# Patient Record
Sex: Male | Born: 1945 | Race: Black or African American | Hispanic: No | Smoking: Light tobacco smoker
Health system: Southern US, Community
[De-identification: ages and names within clinical notes are randomized; demographics above are authoritative.]

## PROBLEM LIST (undated history)

## (undated) DIAGNOSIS — I2510 Atherosclerotic heart disease of native coronary artery without angina pectoris: Secondary | ICD-10-CM

## (undated) DIAGNOSIS — K219 Gastro-esophageal reflux disease without esophagitis: Secondary | ICD-10-CM

## (undated) DIAGNOSIS — J449 Chronic obstructive pulmonary disease, unspecified: Secondary | ICD-10-CM

## (undated) DIAGNOSIS — C801 Malignant (primary) neoplasm, unspecified: Secondary | ICD-10-CM

## (undated) DIAGNOSIS — R519 Headache, unspecified: Secondary | ICD-10-CM

## (undated) DIAGNOSIS — Z87442 Personal history of urinary calculi: Secondary | ICD-10-CM

## (undated) DIAGNOSIS — Z720 Tobacco use: Secondary | ICD-10-CM

## (undated) DIAGNOSIS — M545 Low back pain, unspecified: Secondary | ICD-10-CM

## (undated) DIAGNOSIS — I209 Angina pectoris, unspecified: Secondary | ICD-10-CM

## (undated) DIAGNOSIS — R0989 Other specified symptoms and signs involving the circulatory and respiratory systems: Secondary | ICD-10-CM

## (undated) DIAGNOSIS — M1990 Unspecified osteoarthritis, unspecified site: Secondary | ICD-10-CM

## (undated) DIAGNOSIS — I10 Essential (primary) hypertension: Secondary | ICD-10-CM

## (undated) DIAGNOSIS — G629 Polyneuropathy, unspecified: Secondary | ICD-10-CM

## (undated) DIAGNOSIS — I509 Heart failure, unspecified: Secondary | ICD-10-CM

## (undated) DIAGNOSIS — E785 Hyperlipidemia, unspecified: Secondary | ICD-10-CM

## (undated) DIAGNOSIS — N189 Chronic kidney disease, unspecified: Secondary | ICD-10-CM

## (undated) DIAGNOSIS — E119 Type 2 diabetes mellitus without complications: Secondary | ICD-10-CM

## (undated) HISTORY — DX: Low back pain, unspecified: M54.50

## (undated) HISTORY — DX: Tobacco use: Z72.0

## (undated) HISTORY — DX: Hyperlipidemia, unspecified: E78.5

## (undated) HISTORY — DX: Chronic kidney disease, unspecified: N18.9

## (undated) HISTORY — DX: Type 2 diabetes mellitus without complications: E11.9

## (undated) HISTORY — DX: Malignant (primary) neoplasm, unspecified: C80.1

## (undated) HISTORY — PX: THROAT SURGERY: SHX803

## (undated) HISTORY — DX: Low back pain: M54.5

## (undated) HISTORY — DX: Essential (primary) hypertension: I10

## (undated) HISTORY — DX: Chronic obstructive pulmonary disease, unspecified: J44.9

## (undated) HISTORY — PX: CHOLECYSTECTOMY: SHX55

---

## 2006-01-28 ENCOUNTER — Ambulatory Visit: Payer: Self-pay | Admitting: Gastroenterology

## 2006-01-28 LAB — HM COLONOSCOPY

## 2008-06-09 ENCOUNTER — Ambulatory Visit: Payer: Self-pay | Admitting: Otolaryngology

## 2008-06-09 IMAGING — CT CT NECK WITH CONTRAST
1 of 2 series · 9 of 14 positions shown, 12 images · IV contrast (agent unspecified)
Comparison: none

REASON FOR EXAM: left vocal cord lesion
COMMENTS:

PROCEDURE:     CT  - CT NECK WITH CONTRAST  - [DATE]  [DATE]
RESULT:     CT of the neck is performed utilizing approximately 75 ml of
[XL] iodinated intravenous contrast. Images are reconstructed in the
axial plane at 3 mm slice thickness.
There is no previous examination for comparison.

[Series 2: soft tissue · axial · 0.54mm/px · z∈[+1040,+1242]mm · 9 of 85 slices shown, 12 images]
[im 9/85  soft-tissue]
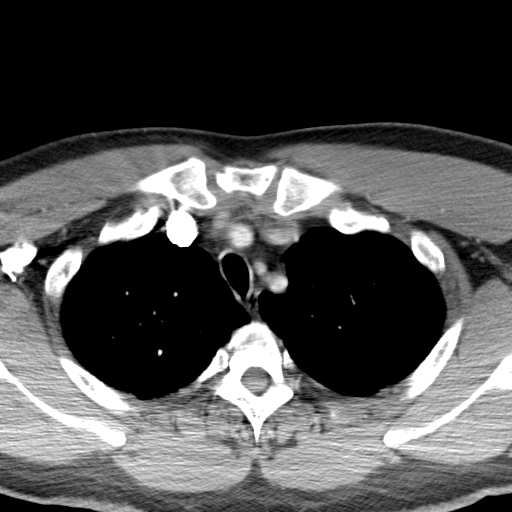
[im 9/85  bone]
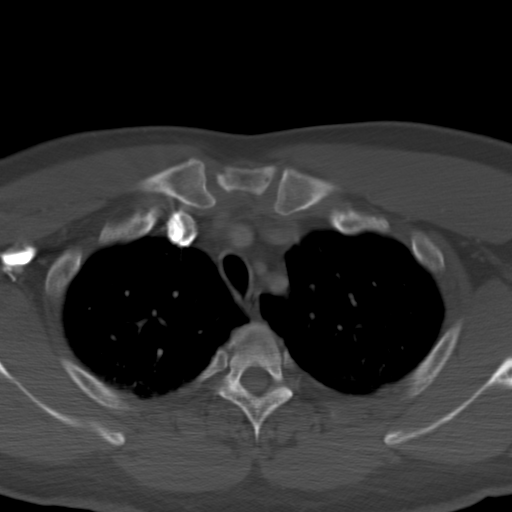
[im 17/85  bone]
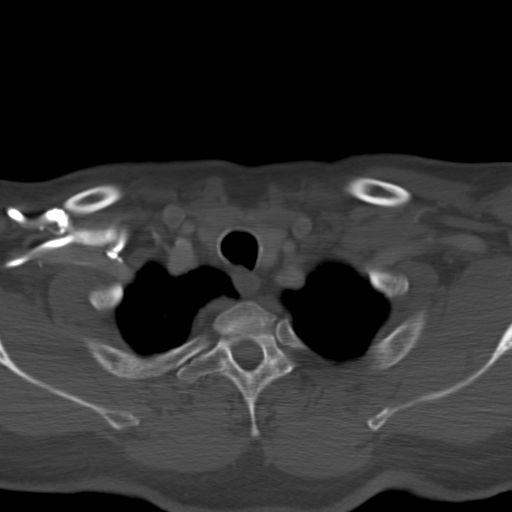
[im 26/85  bone]
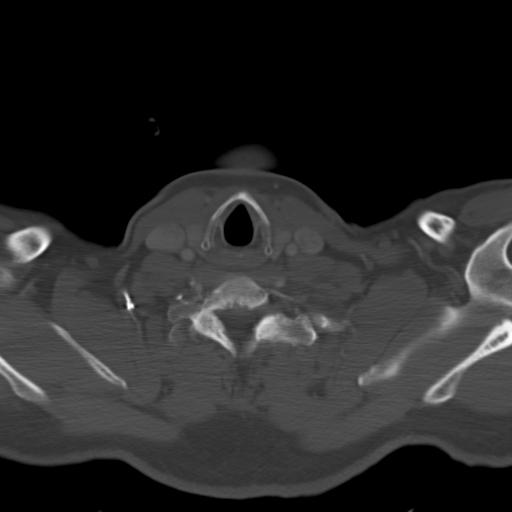
[im 34/85  bone]
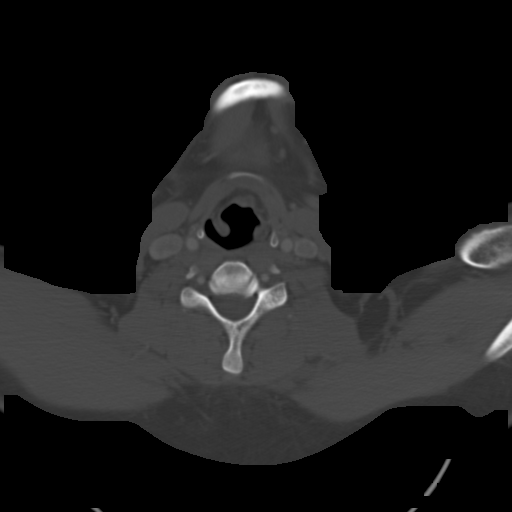
[im 43/85  soft-tissue]
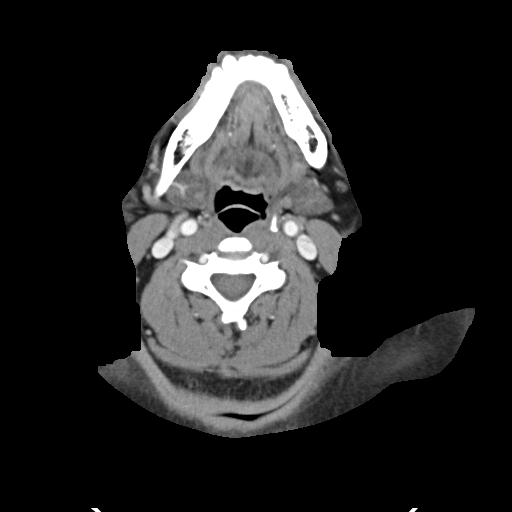
[im 43/85  bone]
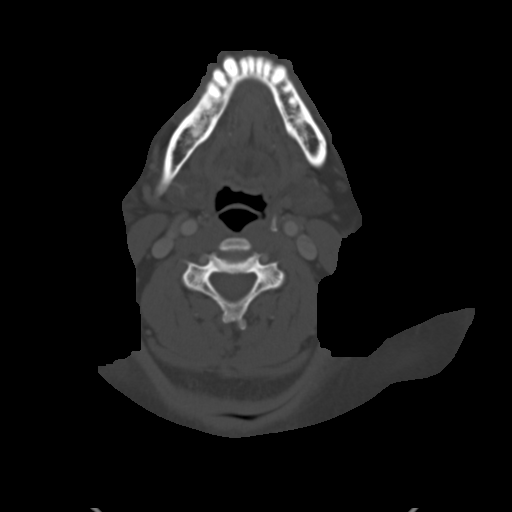
[im 51/85  bone]
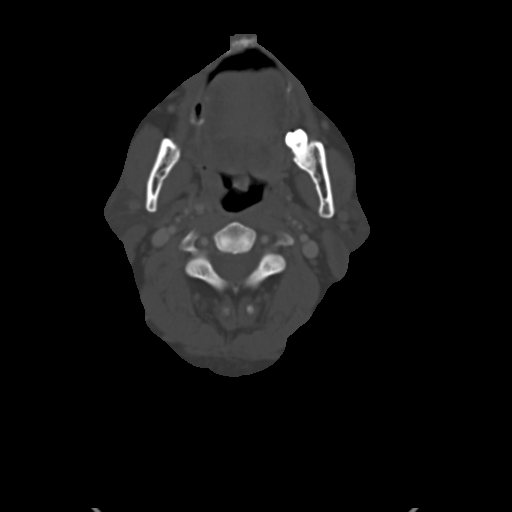
[im 59/85  bone]
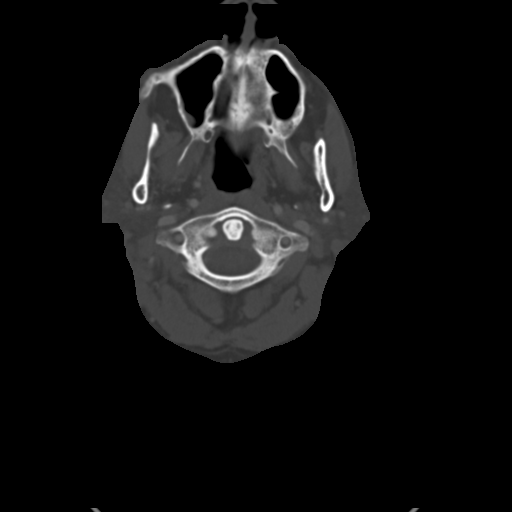
[im 68/85  bone]
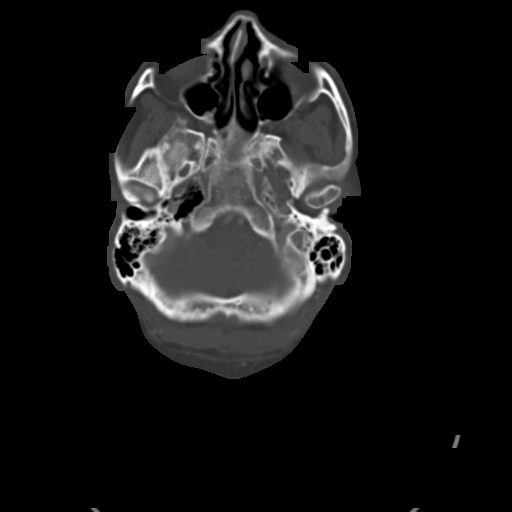
[im 76/85  soft-tissue]
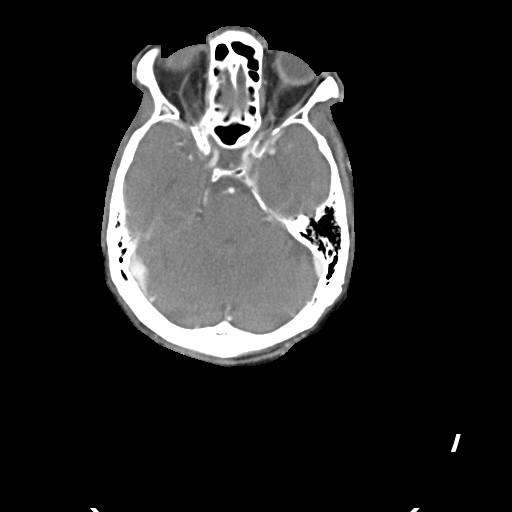
[im 76/85  bone]
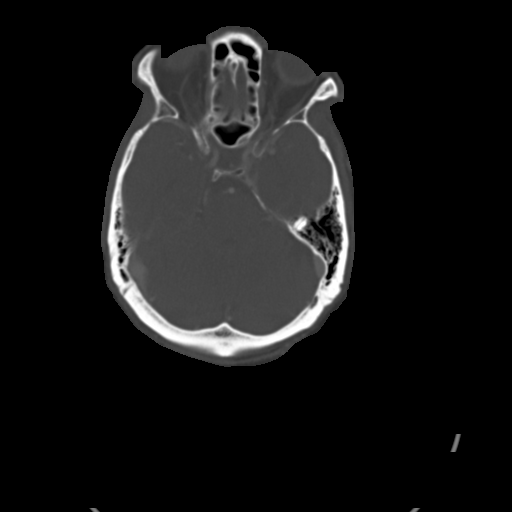

[9 of 14 positions shown; findings below may reference images not displayed]

FINDINGS: Images through the base of the brain demonstrate no evidence of
abnormal enhancement. The included paranasal sinuses and mastoids show what
appears to be an air-fluid level in the right sphenoid sinus with some
mucosal thickening in the left sphenoid sinus. The mastoids show normal
aeration. The nasopharyngeal and oropharyngeal regions appear to be
unremarkable. The base of the tongue is within normal limits. There is no
prevertebral mass evident. The pterygoid fossa is unremarkable. The vascular
space is within normal limits. There is no evidence of adenopathy. No
definite vocal cord lesion is identified by CT. Again, there is no evidence
of cervical adenopathy. The thyroid lobes are unremarkable. The superior
mediastinum is within normal limits.
IMPRESSION: Some areas of mucosal thickening in the left sphenoid sinus
with a small air/fluid level on the right in the sphenoid sinus. Otherwise
unremarkable exam.

## 2008-06-15 ENCOUNTER — Ambulatory Visit: Payer: Self-pay | Admitting: Otolaryngology

## 2008-06-19 ENCOUNTER — Ambulatory Visit: Payer: Self-pay | Admitting: Radiation Oncology

## 2008-06-26 ENCOUNTER — Ambulatory Visit: Payer: Self-pay | Admitting: Radiation Oncology

## 2008-06-28 IMAGING — CT CT GUIDANCE PLACEMENT RAD THERAPY FIELDS
1 series · 16 of 32 positions shown, 20 images · non-contrast
Comparison: none

[Series 2: tx planning · axial · 0.98mm/px · z∈[-122,+150]mm · 16 of 121 slices shown, 20 images]
[im 8/121  soft-tissue]
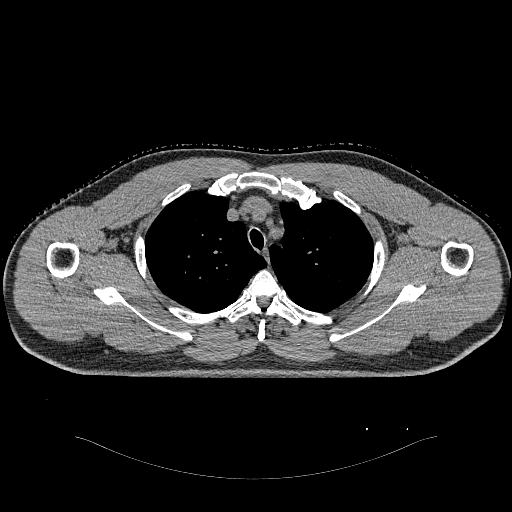
[im 8/121  bone]
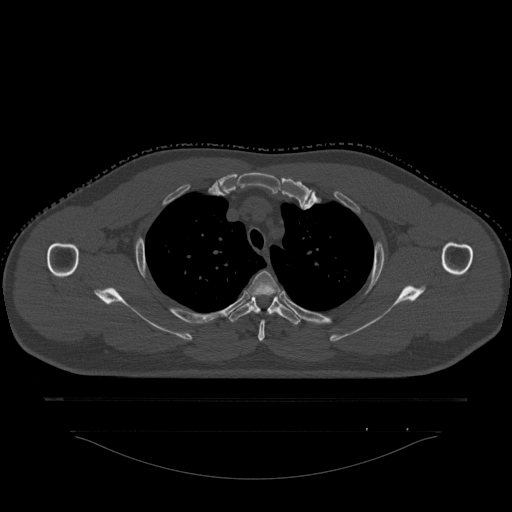
[im 16/121  soft-tissue]
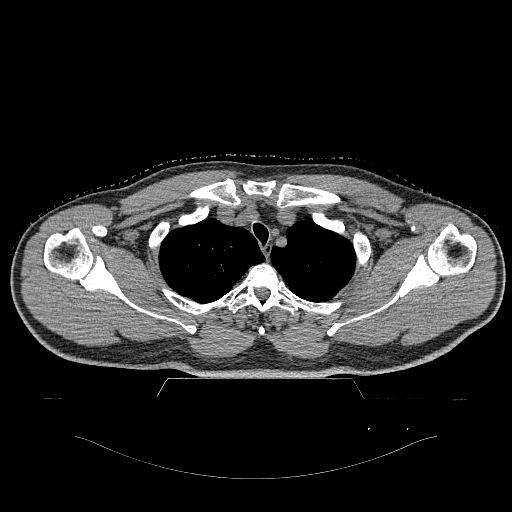
[im 24/121  soft-tissue]
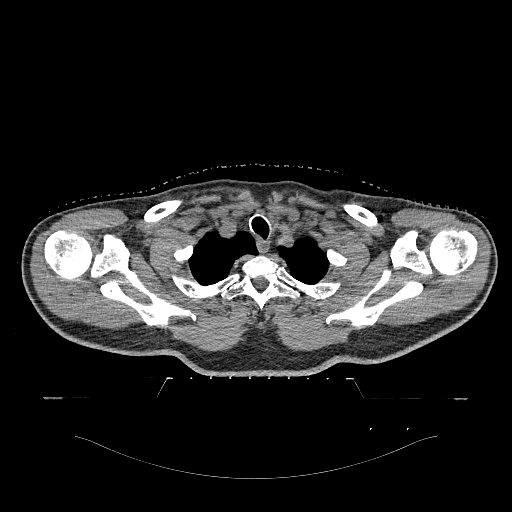
[im 31/121  soft-tissue]
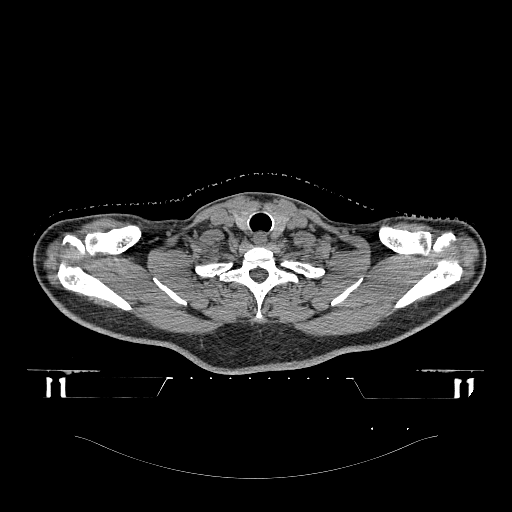
[im 39/121  soft-tissue]
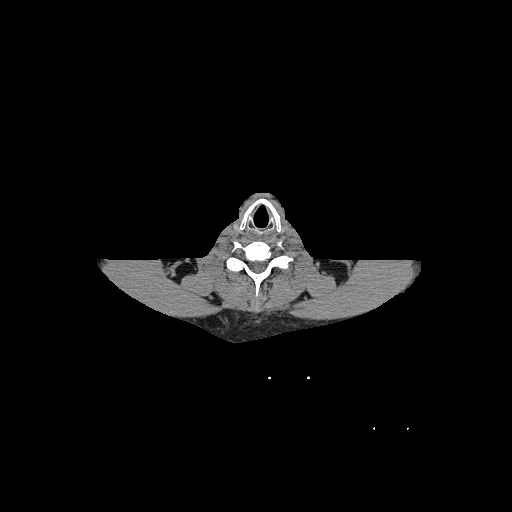
[im 47/121  soft-tissue]
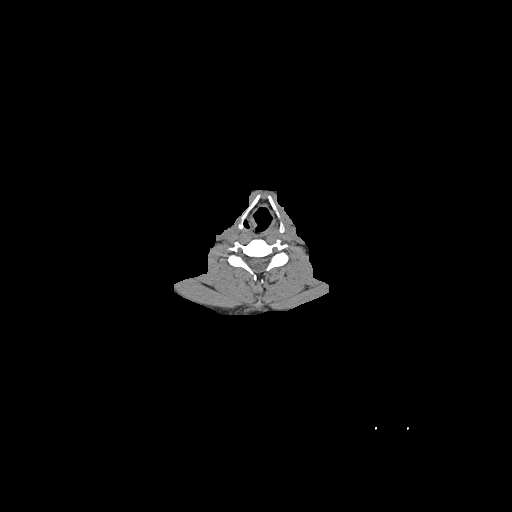
[im 55/121  soft-tissue]
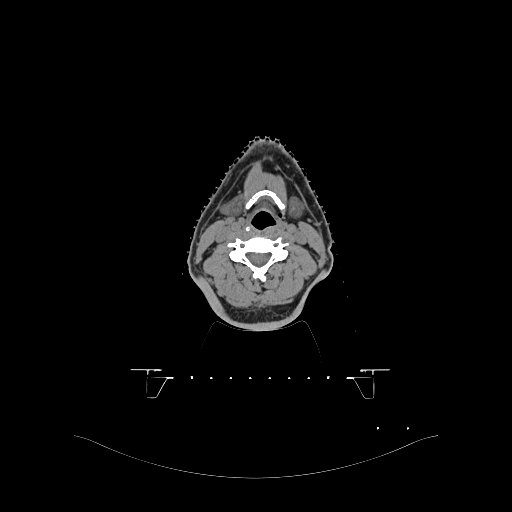
[im 66/121  soft-tissue]
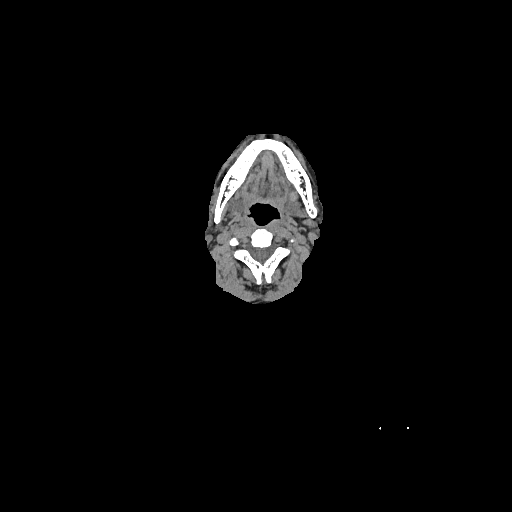
[im 74/121  soft-tissue]
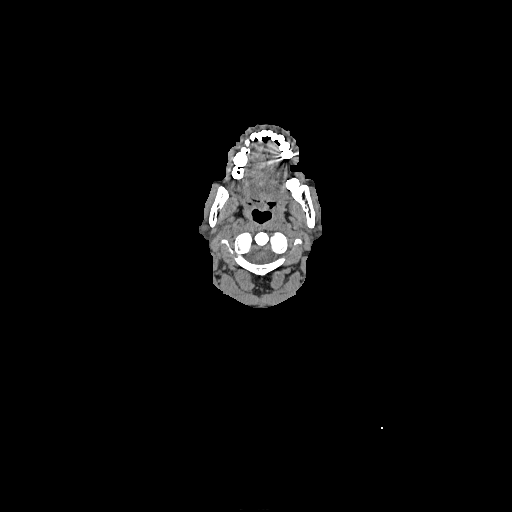
[im 74/121  bone]
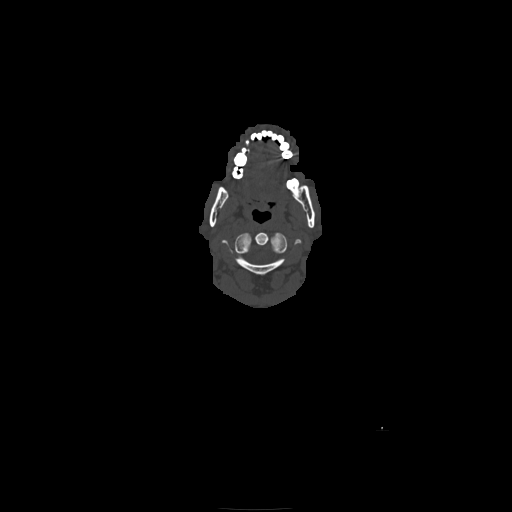
[im 82/121  soft-tissue]
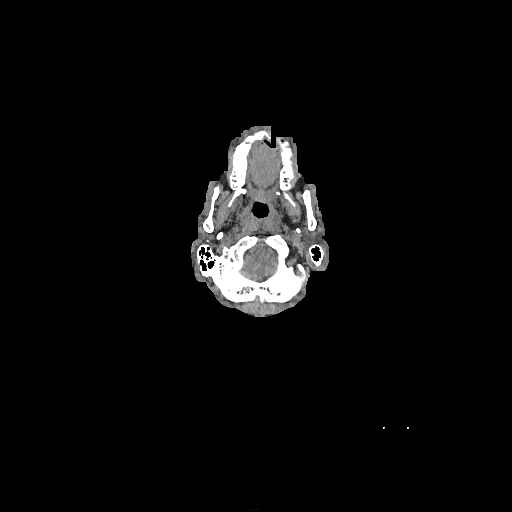
[im 90/121  soft-tissue]
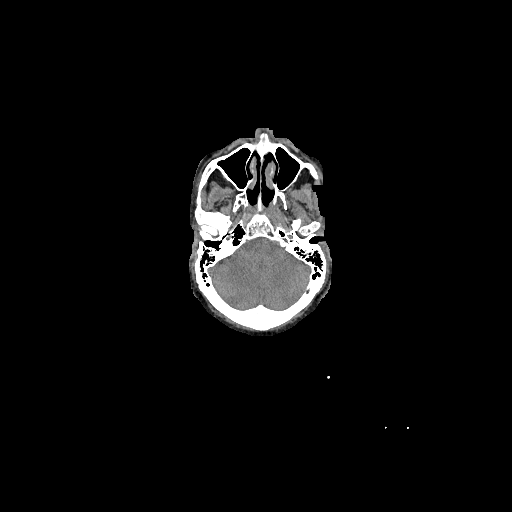
[im 97/121  soft-tissue]
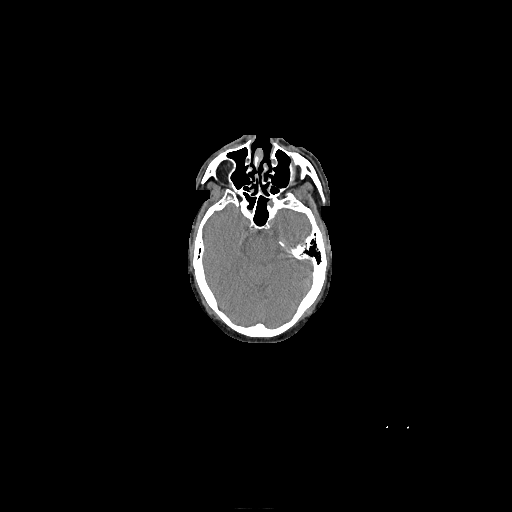
[im 105/121  soft-tissue]
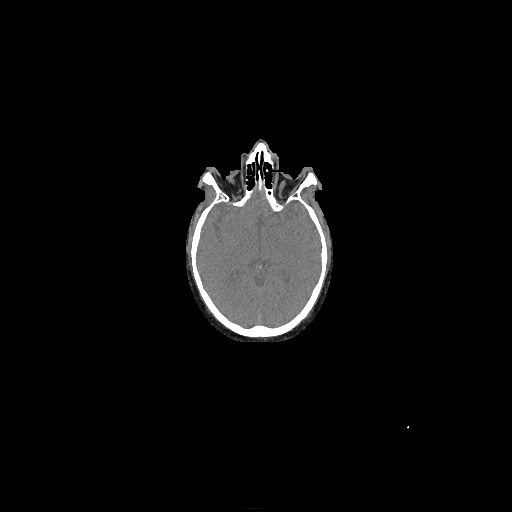
[im 105/121  lung]
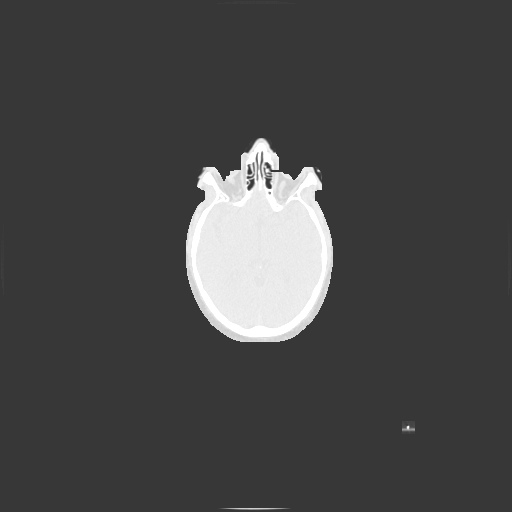
[im 109/121  lung]
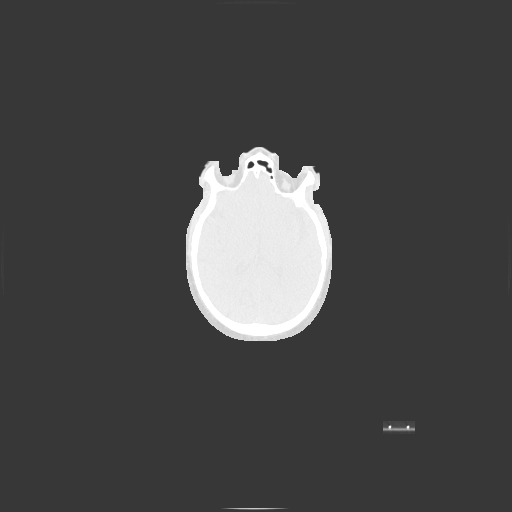
[im 113/121  soft-tissue]
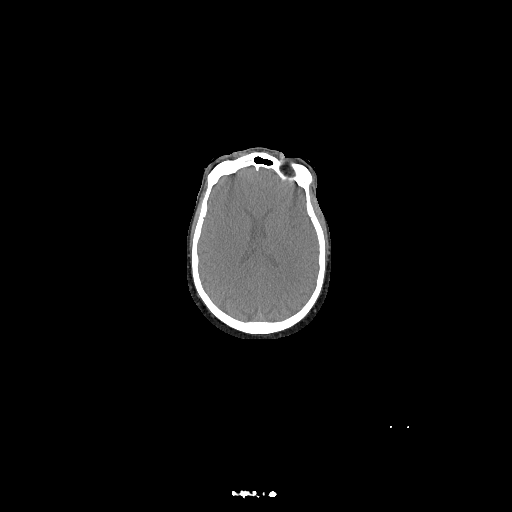
[im 113/121  lung]
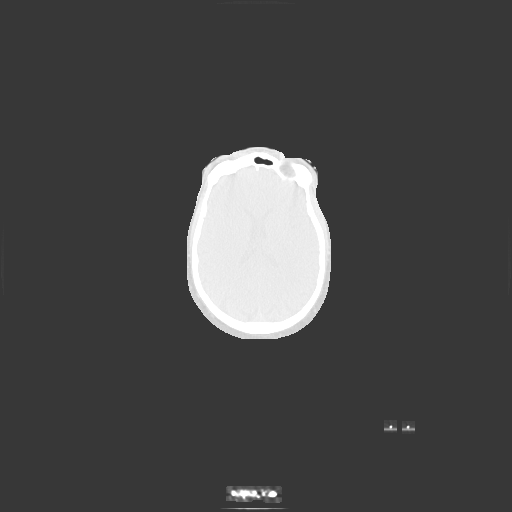
[im 117/121  lung]
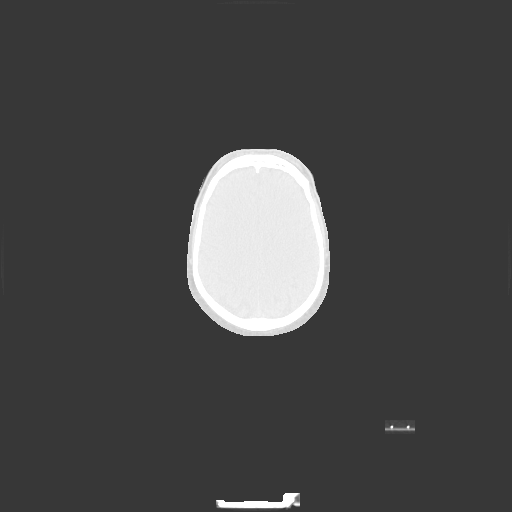

[16 of 32 positions shown; findings below may reference images not displayed]

IMAGES IMPORTED FROM THE [employer] WORKFLOW SYSTEM
NO DICTATION FOR STUDY

## 2008-07-20 ENCOUNTER — Ambulatory Visit: Payer: Self-pay | Admitting: Radiation Oncology

## 2008-08-19 ENCOUNTER — Ambulatory Visit: Payer: Self-pay | Admitting: Radiation Oncology

## 2008-09-19 ENCOUNTER — Ambulatory Visit: Payer: Self-pay | Admitting: Radiation Oncology

## 2008-10-19 ENCOUNTER — Ambulatory Visit: Payer: Self-pay | Admitting: Radiation Oncology

## 2009-01-19 ENCOUNTER — Ambulatory Visit: Payer: Self-pay | Admitting: Radiation Oncology

## 2009-01-29 ENCOUNTER — Ambulatory Visit: Payer: Self-pay | Admitting: Radiation Oncology

## 2009-02-19 ENCOUNTER — Ambulatory Visit: Payer: Self-pay | Admitting: Radiation Oncology

## 2009-07-20 ENCOUNTER — Ambulatory Visit: Payer: Self-pay | Admitting: Radiation Oncology

## 2009-07-30 ENCOUNTER — Ambulatory Visit: Payer: Self-pay | Admitting: Radiation Oncology

## 2009-08-19 ENCOUNTER — Ambulatory Visit: Payer: Self-pay | Admitting: Radiation Oncology

## 2010-07-14 ENCOUNTER — Emergency Department: Payer: Self-pay | Admitting: Emergency Medicine

## 2010-07-14 IMAGING — CT CT STONE STUDY
1 of 2 series · 15 of 32 positions shown, 19 images · non-contrast
Comparison: none

REASON FOR EXAM: right flank pain with mild hematuria
COMMENTS:   LMP: (Male)

PROCEDURE:     CT  - CT ABDOMEN /PELVIS WO (STONE)  - [DATE]  [DATE]
RESULT:     Comparison: None
TECHNIQUE: Multiple axial images from the lung bases to the symphysis pubis
were obtained without oral and without intravenous contrast.

[Series 2: stone · axial · 0.70mm/px · z∈[-666,-270]mm · 15 of 149 slices shown, 19 images]
[im 11/149  soft-tissue]
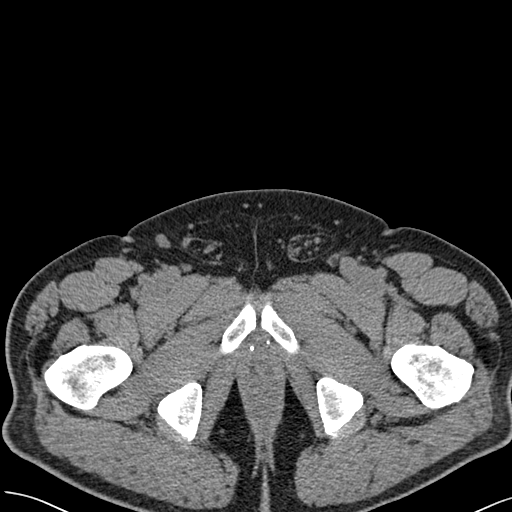
[im 11/149  bone]
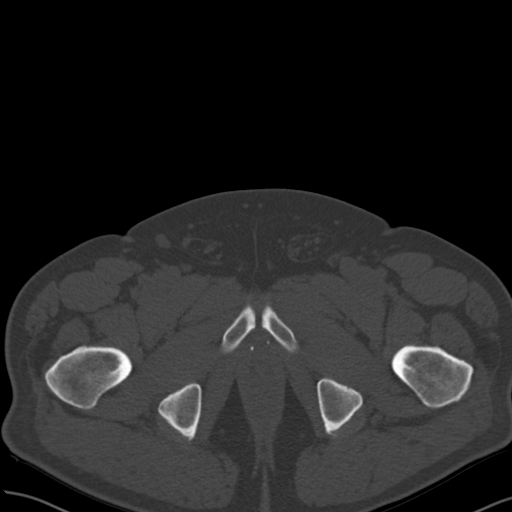
[im 22/149  soft-tissue]
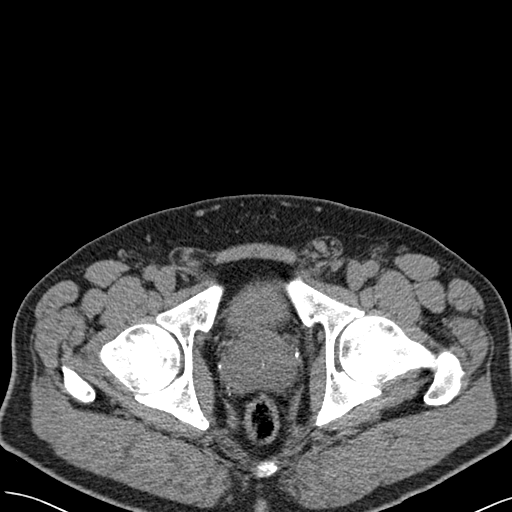
[im 33/149  soft-tissue]
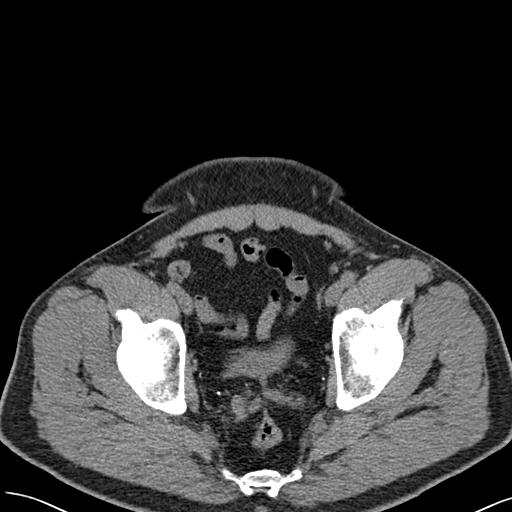
[im 44/149  soft-tissue]
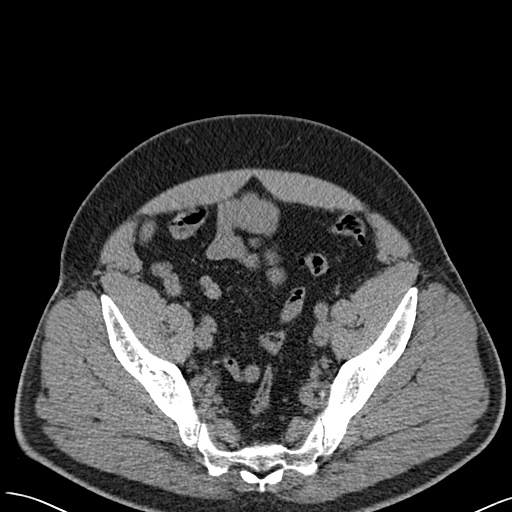
[im 55/149  soft-tissue]
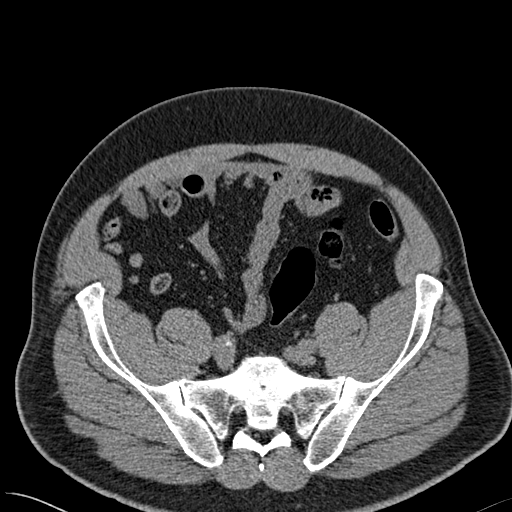
[im 66/149  soft-tissue]
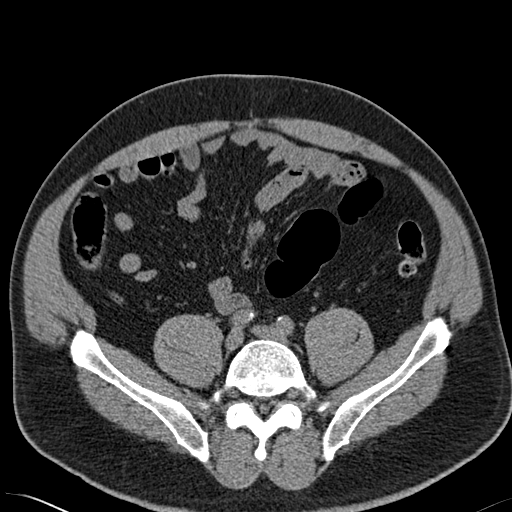
[im 77/149  soft-tissue]
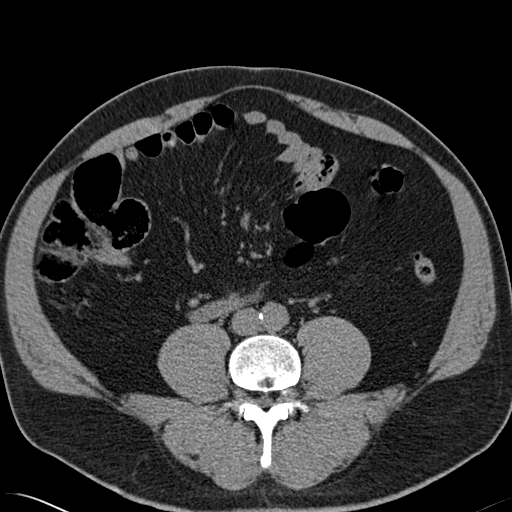
[im 88/149  soft-tissue]
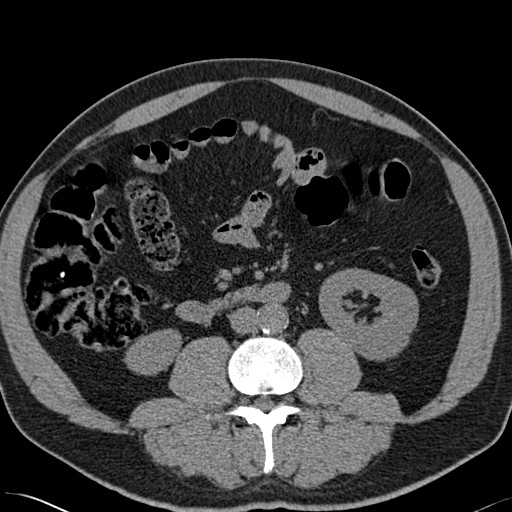
[im 99/149  soft-tissue]
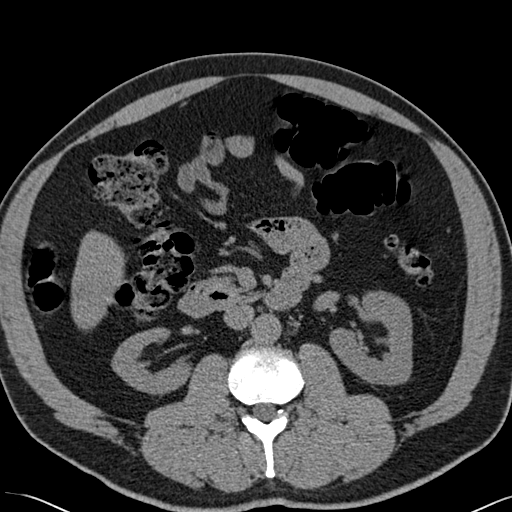
[im 99/149  bone]
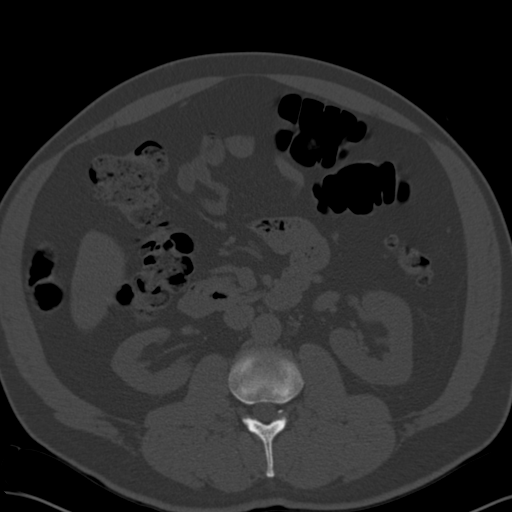
[im 110/149  soft-tissue]
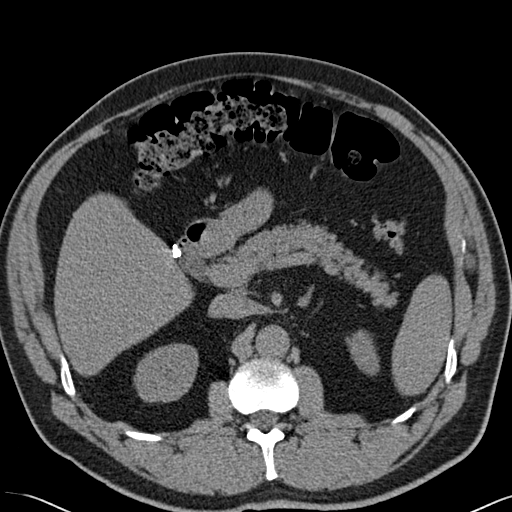
[im 121/149  soft-tissue]
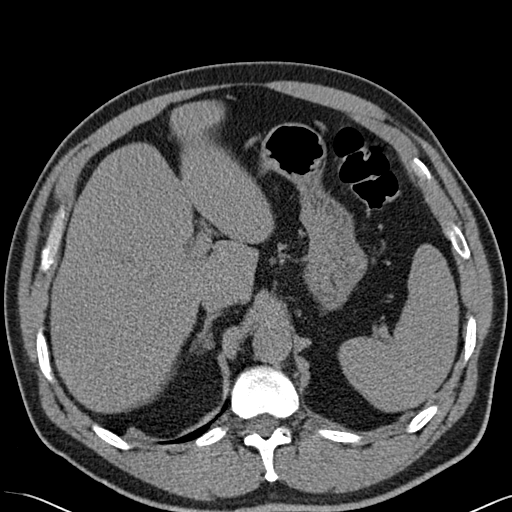
[im 127/149  lung]
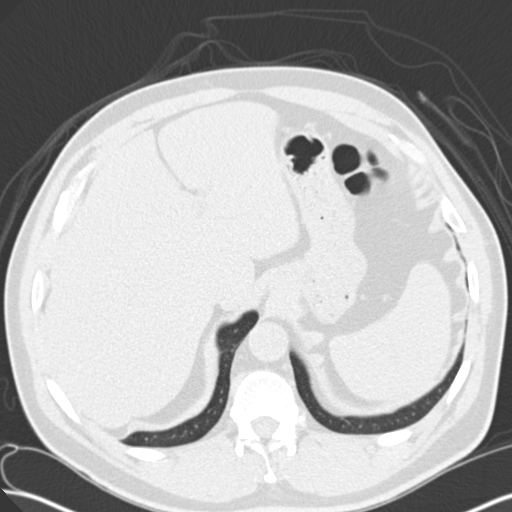
[im 132/149  soft-tissue]
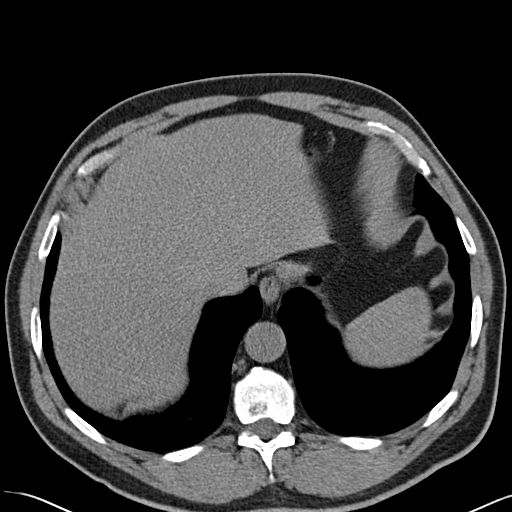
[im 132/149  lung]
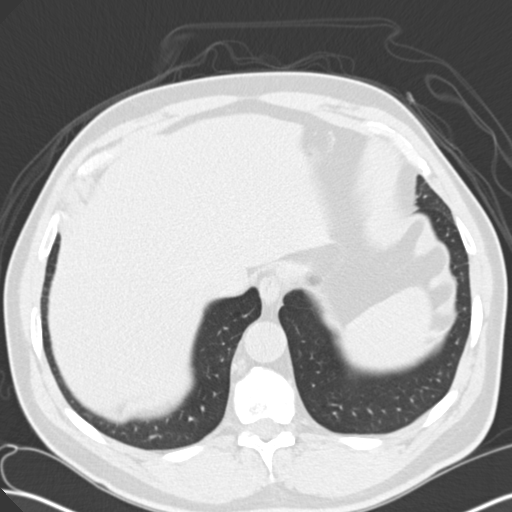
[im 138/149  lung]
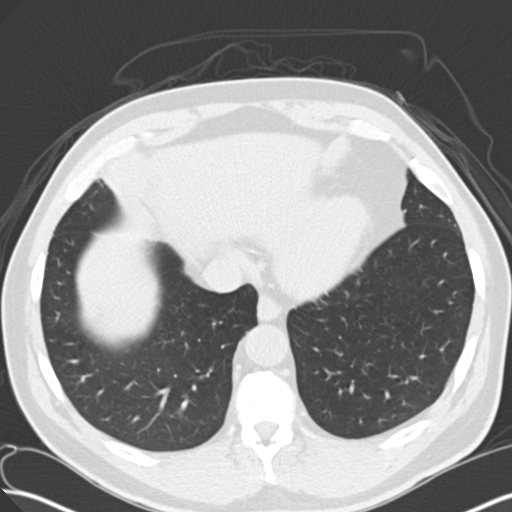
[im 143/149  soft-tissue]
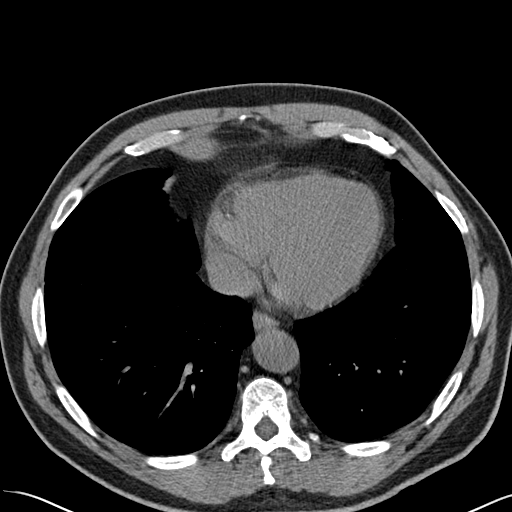
[im 143/149  lung]
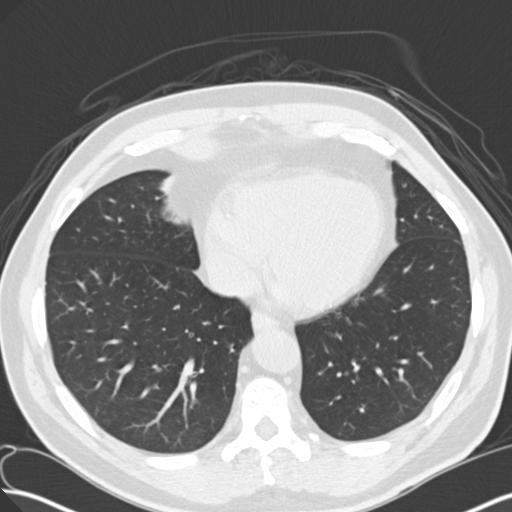

[15 of 32 positions shown; findings below may reference images not displayed]

FINDINGS: Lack of intravenous contrast limits evaluation of the solid abdominal
organs. Grossly, the liver, spleen, adrenals, and pancreas are unremarkable.
Surgical clips seen from prior cholecystectomy. Punctate calcifications in
the liver likely sequela of old prior infection.

No renal calculi identified. No hydronephrosis. No ureterectasis. Small 2-3
mm calcifications in the pelvis are felt to represent phleboliths.
Evaluation of the ureters in this location is difficult as the ureters are
decompressed.

The small and large bowel are normal in caliber. There is diverticulosis of
the sigmoid colon and descending colon. The appendix is normal.

No aggressive lytic or sclerotic osseous lesions identified. A small
sclerotic density in the L3 vertebral body is felt to represent a bone
island.
IMPRESSION: No urinary calculi or hydronephrosis.

## 2010-07-31 ENCOUNTER — Ambulatory Visit: Payer: Self-pay | Admitting: Radiation Oncology

## 2010-08-20 ENCOUNTER — Ambulatory Visit: Payer: Self-pay | Admitting: Radiation Oncology

## 2013-02-08 LAB — PSA

## 2014-02-22 ENCOUNTER — Ambulatory Visit: Payer: Self-pay

## 2014-02-22 IMAGING — US ULTRASOUND RETROPERITONEAL COMPLETE
1 series · 14 of 25 positions shown · non-contrast
Comparison: CT of the abdomen and pelvis without contrast
[DATE].

CLINICAL DATA: 66-year-old male with family history of abdominal
aortic aneurysm. Abdominal aortic aneurysm screening.

EXAM:
ULTRASOUND RETROPERITONEAL COMPLETE
TECHNIQUE: Ultrasound examination of the abdominal aorta was performed to
evaluate for abdominal aortic aneurysm. The common iliac arteries,
IVC, and kidneys were also evaluated.

[Series 1: ultrasound retroperitoneal complete · 0.31mm/px · 14 of 81 slices shown]
[im 1/81]
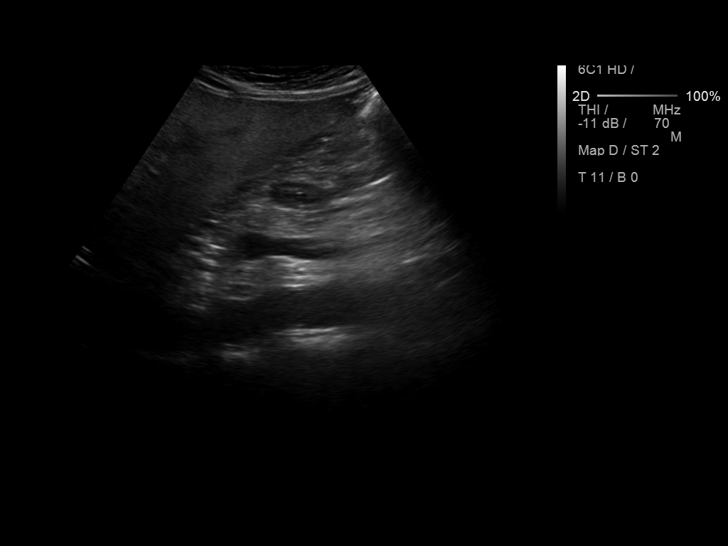
[im 7/81]
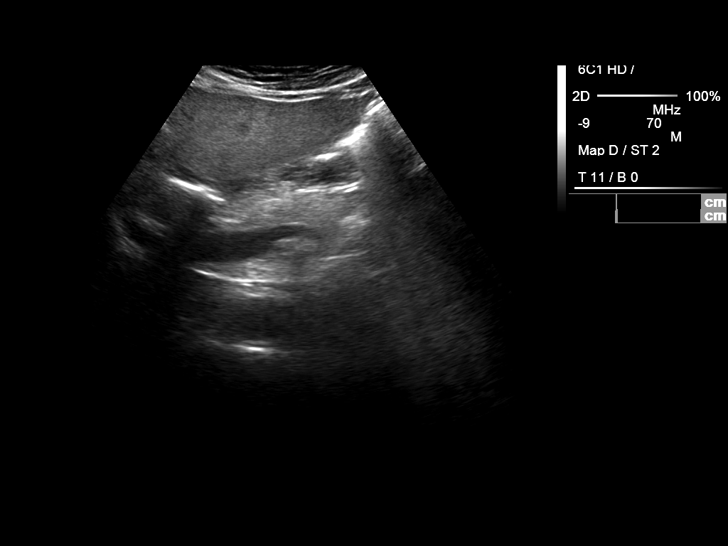
[im 14/81]
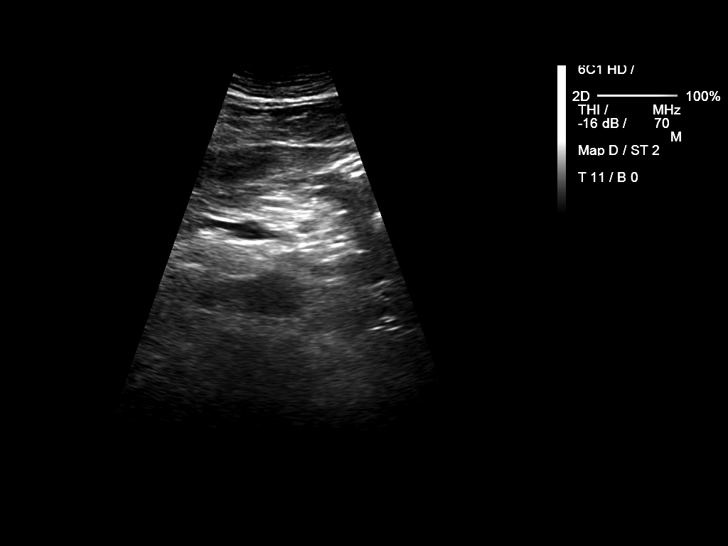
[im 21/81]
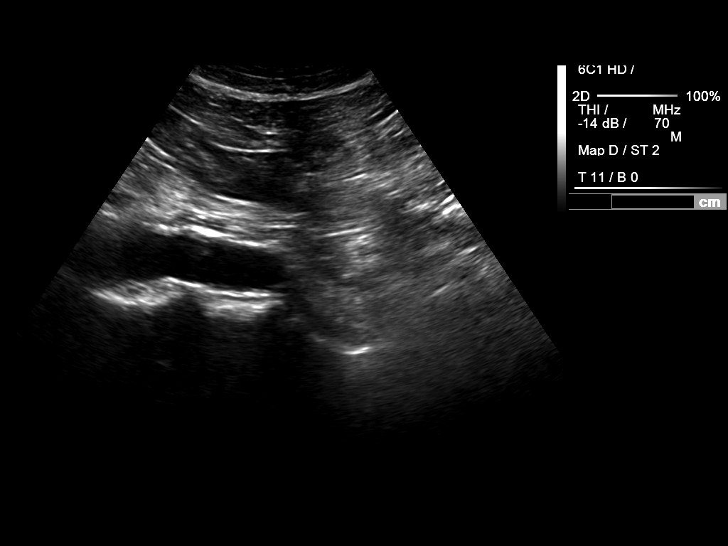
[im 27/81]
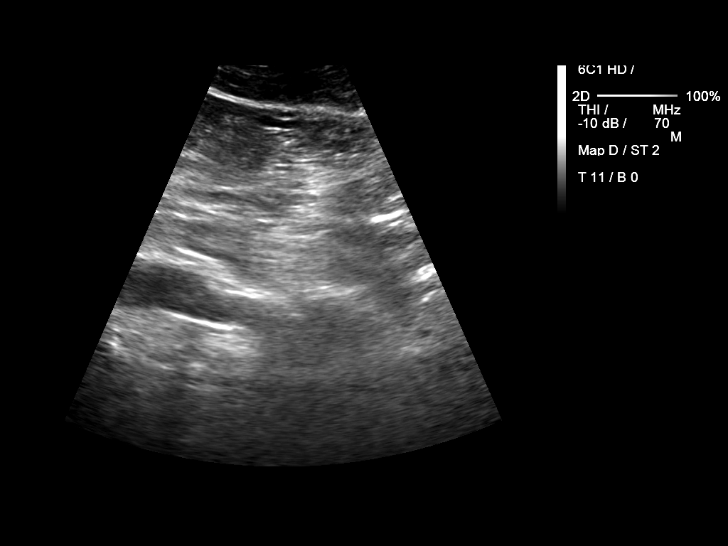
[im 31/81]
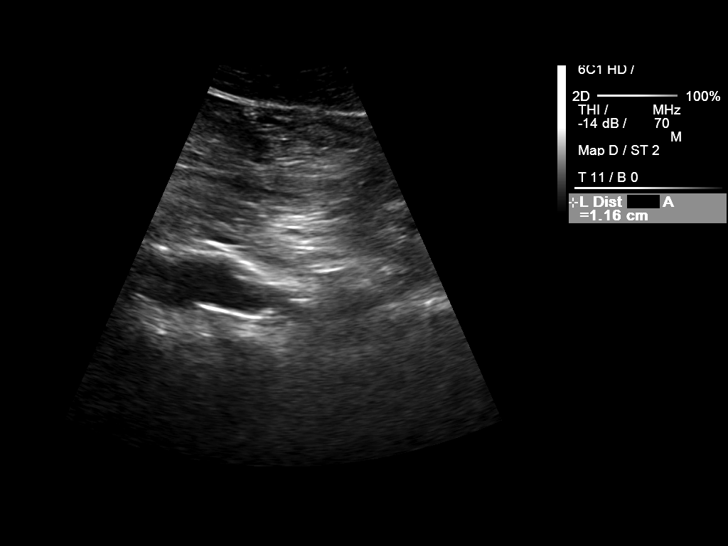
[im 37/81]
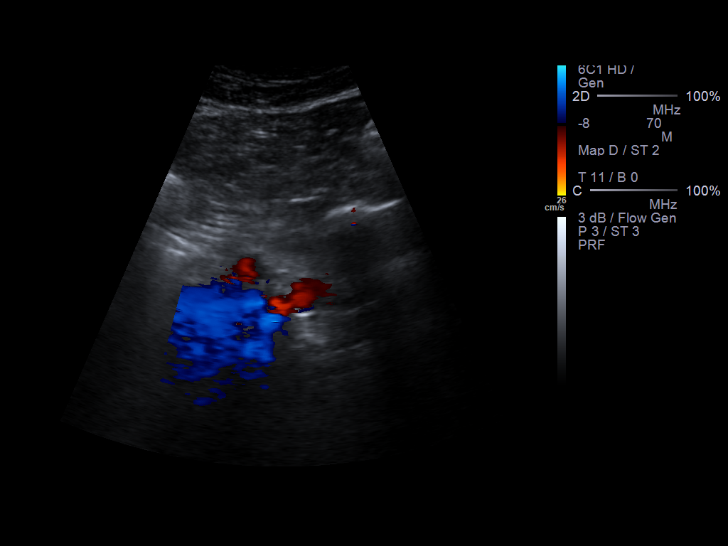
[im 44/81]
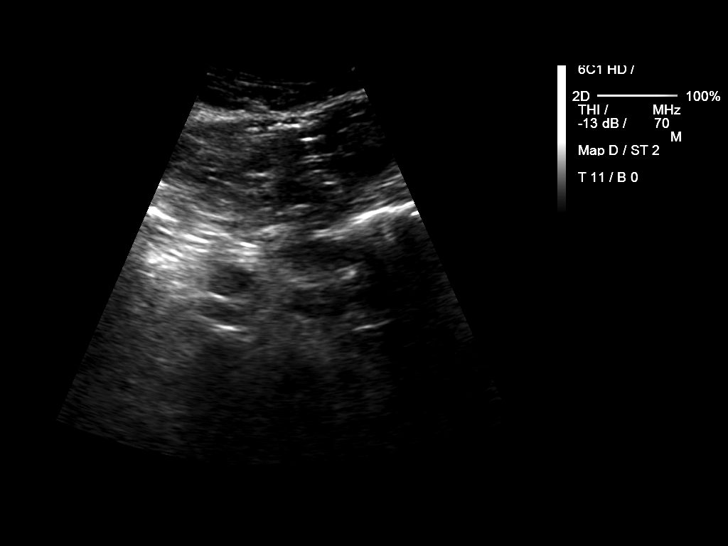
[im 51/81]
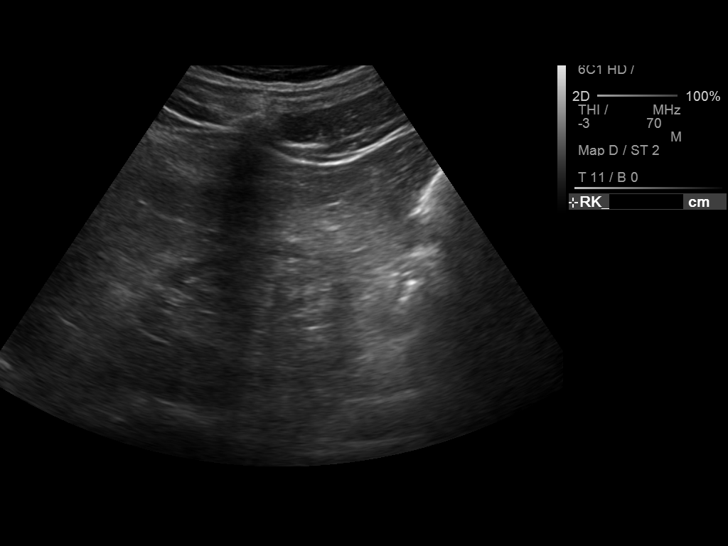
[im 54/81]
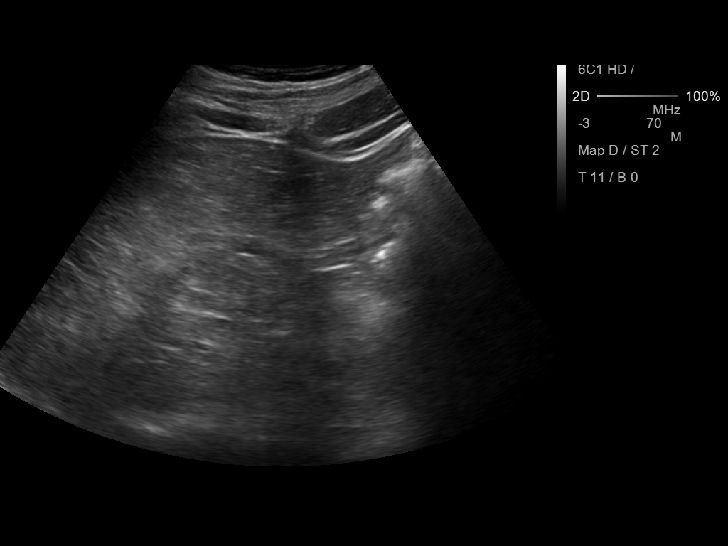
[im 61/81]
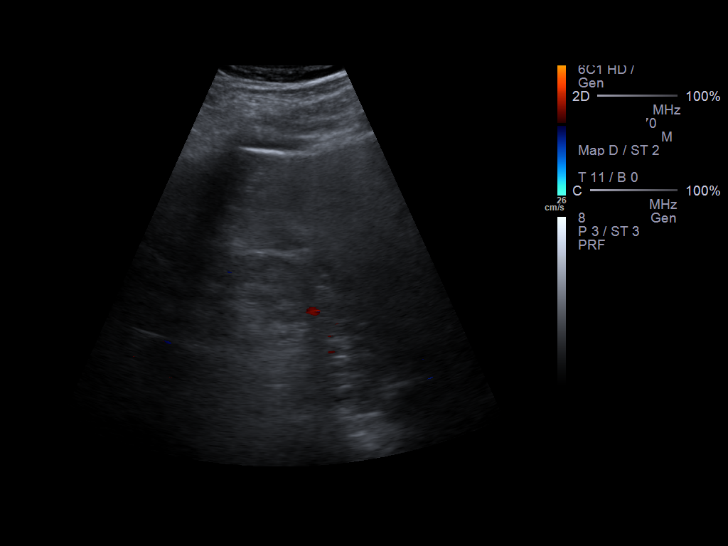
[im 67/81]
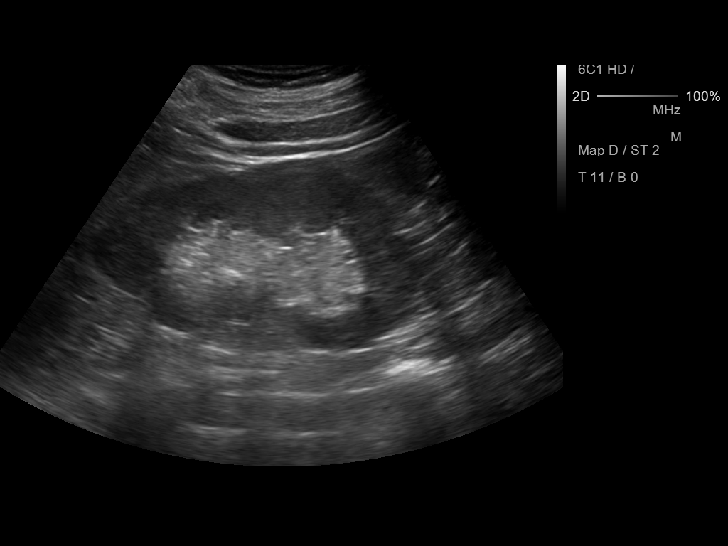
[im 74/81]
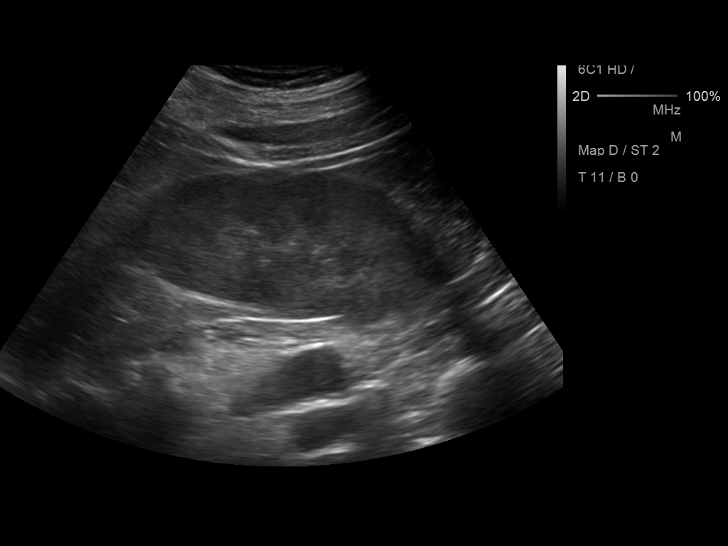
[im 81/81]
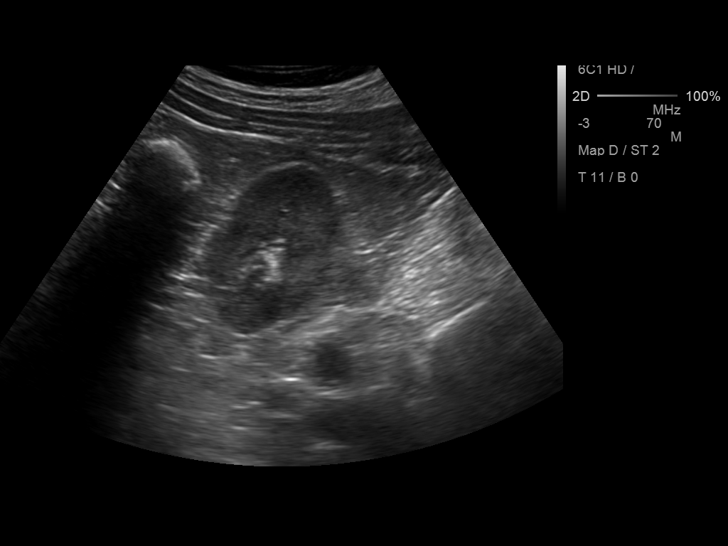

[14 of 25 positions shown; findings below may reference images not displayed]

FINDINGS: Abdominal Aorta

No aneurysm identified.

Maximum AP

Diameter:  2.3 cm

Maximum TRV

Diameter: 2.1 cm

Right Common Iliac Artery

No aneurysm identified.

Left Common Iliac Artery

No aneurysm identified.

IVC

No abnormality visualized.

Right Kidney

Length: 8.3 cm Diffuse cortical thinning and diffusely echogenic
renal parenchyma, suggestive of medical renal disease. No mass or
hydronephrosis visualized.

Left Kidney

Length: 12.2 cm Echogenicity within normal limits. No mass or
hydronephrosis visualized.
IMPRESSION: 1. No evidence of abdominal aortic aneurysm.
2. Atrophic right kidney with diffuse cortical thinning and
increased cortical echogenicity, indicative of medical renal
disease.

## 2014-10-17 ENCOUNTER — Encounter: Payer: Self-pay | Admitting: Unknown Physician Specialty

## 2014-10-17 DIAGNOSIS — M109 Gout, unspecified: Secondary | ICD-10-CM

## 2014-10-17 DIAGNOSIS — N183 Chronic kidney disease, stage 3 unspecified: Secondary | ICD-10-CM

## 2014-10-17 DIAGNOSIS — J449 Chronic obstructive pulmonary disease, unspecified: Secondary | ICD-10-CM

## 2014-10-17 DIAGNOSIS — F17200 Nicotine dependence, unspecified, uncomplicated: Secondary | ICD-10-CM

## 2014-10-17 DIAGNOSIS — E1122 Type 2 diabetes mellitus with diabetic chronic kidney disease: Secondary | ICD-10-CM

## 2014-10-17 DIAGNOSIS — C329 Malignant neoplasm of larynx, unspecified: Secondary | ICD-10-CM

## 2014-10-17 DIAGNOSIS — E785 Hyperlipidemia, unspecified: Secondary | ICD-10-CM

## 2014-10-17 DIAGNOSIS — I10 Essential (primary) hypertension: Secondary | ICD-10-CM

## 2014-10-17 DIAGNOSIS — I129 Hypertensive chronic kidney disease with stage 1 through stage 4 chronic kidney disease, or unspecified chronic kidney disease: Secondary | ICD-10-CM

## 2014-10-17 DIAGNOSIS — M545 Low back pain: Secondary | ICD-10-CM

## 2014-10-17 LAB — HM DIABETES EYE EXAM

## 2014-12-21 ENCOUNTER — Encounter: Payer: Self-pay | Admitting: Unknown Physician Specialty

## 2014-12-21 DIAGNOSIS — J449 Chronic obstructive pulmonary disease, unspecified: Secondary | ICD-10-CM | POA: Insufficient documentation

## 2014-12-21 DIAGNOSIS — E1122 Type 2 diabetes mellitus with diabetic chronic kidney disease: Secondary | ICD-10-CM | POA: Insufficient documentation

## 2014-12-21 DIAGNOSIS — M545 Low back pain, unspecified: Secondary | ICD-10-CM | POA: Insufficient documentation

## 2014-12-21 DIAGNOSIS — E785 Hyperlipidemia, unspecified: Secondary | ICD-10-CM

## 2014-12-21 DIAGNOSIS — I130 Hypertensive heart and chronic kidney disease with heart failure and stage 1 through stage 4 chronic kidney disease, or unspecified chronic kidney disease: Secondary | ICD-10-CM | POA: Insufficient documentation

## 2014-12-21 DIAGNOSIS — C329 Malignant neoplasm of larynx, unspecified: Secondary | ICD-10-CM | POA: Insufficient documentation

## 2014-12-21 DIAGNOSIS — E1169 Type 2 diabetes mellitus with other specified complication: Secondary | ICD-10-CM | POA: Insufficient documentation

## 2014-12-21 DIAGNOSIS — N183 Chronic kidney disease, stage 3 unspecified: Secondary | ICD-10-CM | POA: Insufficient documentation

## 2014-12-21 DIAGNOSIS — M109 Gout, unspecified: Secondary | ICD-10-CM | POA: Insufficient documentation

## 2014-12-21 DIAGNOSIS — J432 Centrilobular emphysema: Secondary | ICD-10-CM | POA: Insufficient documentation

## 2014-12-21 DIAGNOSIS — F17219 Nicotine dependence, cigarettes, with unspecified nicotine-induced disorders: Secondary | ICD-10-CM | POA: Insufficient documentation

## 2014-12-22 ENCOUNTER — Encounter: Payer: Self-pay | Admitting: Unknown Physician Specialty

## 2014-12-22 ENCOUNTER — Ambulatory Visit (INDEPENDENT_AMBULATORY_CARE_PROVIDER_SITE_OTHER): Payer: Commercial Managed Care - HMO | Admitting: Unknown Physician Specialty

## 2014-12-22 VITALS — BP 116/71 | HR 72 | Temp 97.9°F | Ht 69.2 in | Wt 202.0 lb

## 2014-12-22 DIAGNOSIS — E785 Hyperlipidemia, unspecified: Secondary | ICD-10-CM | POA: Diagnosis not present

## 2014-12-22 DIAGNOSIS — N189 Chronic kidney disease, unspecified: Secondary | ICD-10-CM | POA: Diagnosis not present

## 2014-12-22 DIAGNOSIS — N185 Chronic kidney disease, stage 5: Secondary | ICD-10-CM

## 2014-12-22 DIAGNOSIS — N181 Chronic kidney disease, stage 1: Secondary | ICD-10-CM | POA: Diagnosis not present

## 2014-12-22 DIAGNOSIS — N183 Chronic kidney disease, stage 3 unspecified: Secondary | ICD-10-CM

## 2014-12-22 DIAGNOSIS — N184 Chronic kidney disease, stage 4 (severe): Secondary | ICD-10-CM

## 2014-12-22 DIAGNOSIS — M109 Gout, unspecified: Secondary | ICD-10-CM | POA: Diagnosis not present

## 2014-12-22 DIAGNOSIS — Z720 Tobacco use: Secondary | ICD-10-CM

## 2014-12-22 DIAGNOSIS — N182 Chronic kidney disease, stage 2 (mild): Secondary | ICD-10-CM

## 2014-12-22 DIAGNOSIS — F17200 Nicotine dependence, unspecified, uncomplicated: Secondary | ICD-10-CM

## 2014-12-22 DIAGNOSIS — I129 Hypertensive chronic kidney disease with stage 1 through stage 4 chronic kidney disease, or unspecified chronic kidney disease: Secondary | ICD-10-CM | POA: Diagnosis not present

## 2014-12-22 DIAGNOSIS — E1122 Type 2 diabetes mellitus with diabetic chronic kidney disease: Secondary | ICD-10-CM | POA: Diagnosis not present

## 2014-12-22 LAB — MICROALBUMIN, URINE WAIVED
CREATININE, URINE WAIVED: 50 mg/dL (ref 10–300)
Microalb, Ur Waived: 10 mg/L (ref 0–19)

## 2014-12-22 LAB — BAYER DCA HB A1C WAIVED: HB A1C: 7 % — AB (ref <7.0)

## 2014-12-22 MED ORDER — RAMIPRIL 5 MG PO CAPS: 5.0000 mg | ORAL_CAPSULE | Freq: Every day | ORAL | Status: DC

## 2014-12-22 MED ORDER — ALLOPURINOL 100 MG PO TABS: 100.0000 mg | ORAL_TABLET | Freq: Every day | ORAL | Status: DC

## 2014-12-22 MED ORDER — METOPROLOL TARTRATE 25 MG PO TABS: 25.0000 mg | ORAL_TABLET | Freq: Two times a day (BID) | ORAL | Status: DC

## 2014-12-22 MED ORDER — METFORMIN HCL 500 MG PO TABS: 500.0000 mg | ORAL_TABLET | Freq: Two times a day (BID) | ORAL | Status: DC

## 2014-12-22 NOTE — Assessment & Plan Note (Signed)
Stable, continue present medications.   

## 2014-12-22 NOTE — Assessment & Plan Note (Addendum)
Hgb A1C is 7.  Recheck in 3 months

## 2014-12-22 NOTE — Progress Notes (Signed)
BP 116/71 mmHg  Pulse 72  Temp(Src) 97.9 F (36.6 C)  Ht 5' 9.2" (1.758 m)  Wt 202 lb (91.627 kg)  BMI 29.65 kg/m2  SpO2 95%   Subjective:    Patient ID: Philip Wood, male    DOB: 1945-08-15, 69 y.o.   MRN: [id number]  HPI: Philip Wood is a 69 y.o. male  Chief Complaint  Patient presents with  . Diabetes  . Hyperlipidemia  . Hypertension  . Gout   1.  GOUT Patient is requesting refill(s). Gout status:  controlled  H6  Satisfied with current treatment?  yes  H6  Side effects:  No medication side effects.  P1 Medication compliance:  excellent compliance  P1 URIC ACID: 5.2 on last visit D1  2.  DIABETES Hypoglycemic episodes:  no  H8  Polydipsia/polyuria:  no  H8 Visual disturbance:  no  R2  Chest pain:  no  R4  Paresthesias:  no  R10  Glucose Monitoring:  yes   Accucheck frequency:  occasional Post prandial:  120-130s  HGB A1C: 6.2 6 months ago D1  GLUCOSE: 126 Blood Pressure Monitoring:  not checking.  H3 Foot Exam: Foot Exam Done on 02/15/14 P1 Aspirin:  Aspirin Therapy Done on 02/08/13 (Taking aspirin  '81mg'$ )   3.  HYPERTENSION / [place] Patient is requesting refill(s). Satisfied with current treatment?  yes  H6  Duration of hypertension:  chronic  H4  BP medication side effects:  no P1 Duration of hyperlipidemia:  chronic  H4  Cholesterol medication side effects:  no P1  Cholesterol supplements:  none  P1 Past cholesterol medications:  none  P1 Aspirin:  Aspirin Therapy Done on 02/08/13 Recurrent headaches:  no  R10 Visual changes:  no  R2  Palpitations:  no  R4  Dyspnea:  no  R5  Chest pain:  no  R4  Lower extremity edema:  no  R4  Dizzy/lightheaded:  no  R10   Tobacco use Down to 1/2 ppd.  States his breathing is doing well on [place] every day.     Relevant past medical, surgical, family and social history reviewed and updated as indicated. Interim medical history since our last visit reviewed. Allergies and medications  reviewed and updated.  Review of Systems  Per HPI unless specifically indicated above     Objective:    BP 116/71 mmHg  Pulse 72  Temp(Src) 97.9 F (36.6 C)  Ht 5' 9.2" (1.758 m)  Wt 202 lb (91.627 kg)  BMI 29.65 kg/m2  SpO2 95%  Wt Readings from Last 3 Encounters:  12/22/14 202 lb (91.627 kg)  06/20/14 203 lb (92.08 kg)    Physical Exam  Constitutional: He is oriented to person, place, and time. He appears well-developed and well-nourished. No distress.  HENT:  Head: Normocephalic and atraumatic.  Eyes: Conjunctivae and lids are normal. Right eye exhibits no discharge. Left eye exhibits no discharge. No scleral icterus.  Cardiovascular: Normal rate, regular rhythm and normal heart sounds.   Pulmonary/Chest: Effort normal and breath sounds normal. No respiratory distress.  Abdominal: Soft. Normal appearance and bowel sounds are normal. He exhibits no distension. There is no splenomegaly or hepatomegaly. There is no tenderness.  Musculoskeletal: Normal range of motion.  Neurological: He is alert and oriented to person, place, and time.  Skin: Skin is warm, dry and intact. No rash noted. No pallor.  Psychiatric: He has a normal mood and affect. His behavior is normal. Judgment and thought content normal.  Vitals reviewed.      Assessment & Plan:   Problem List Items Addressed This Visit      Unprioritized   Diabetes mellitus with chronic kidney disease    Hgb A1C is 7.  Recheck in 3 months      Relevant Medications   metFORMIN (GLUCOPHAGE) 500 MG tablet   ramipril (ALTACE) 5 MG capsule   Other Relevant Orders   Microalbumin, Urine Waived   Comprehensive metabolic panel   Hyperlipidemia    Stable, continue present medications.       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       Relevant Orders   Bayer DCA Hb A1c Waived   Microalbumin, Urine Waived   Uric acid   Comprehensive metabolic panel   CKD (chronic kidney disease), stage III   Relevant Orders   CBC with Differential/Platelet   Tobacco use disorder    Pt education provided      Gout    Check Uric Acid.  Continue Allopurinol          Follow up plan: Return in about 3 months (around 03/23/2015) for physical.

## 2014-12-22 NOTE — Assessment & Plan Note (Signed)
Pt education provided

## 2014-12-22 NOTE — Assessment & Plan Note (Signed)
Check Uric Acid.  Continue Allopurinol

## 2014-12-23 LAB — CBC WITH DIFFERENTIAL/PLATELET
BASOS: 0 %
Basophils Absolute: 0 10*3/uL (ref 0.0–0.2)
EOS (ABSOLUTE): 0.5 10*3/uL — AB (ref 0.0–0.4)
EOS: 7 %
HEMATOCRIT: 43.8 % (ref 37.5–51.0)
Hemoglobin: 15.3 g/dL (ref 12.6–17.7)
Immature Grans (Abs): 0 10*3/uL (ref 0.0–0.1)
Immature Granulocytes: 0 %
LYMPHS ABS: 2.2 10*3/uL (ref 0.7–3.1)
Lymphs: 34 %
MCH: 32 pg (ref 26.6–33.0)
MCHC: 34.9 g/dL (ref 31.5–35.7)
MCV: 92 fL (ref 79–97)
MONOS ABS: 0.7 10*3/uL (ref 0.1–0.9)
Monocytes: 10 %
NEUTROS ABS: 3.1 10*3/uL (ref 1.4–7.0)
Neutrophils: 49 %
PLATELETS: 188 10*3/uL (ref 150–379)
RBC: 4.78 x10E6/uL (ref 4.14–5.80)
RDW: 14 % (ref 12.3–15.4)
WBC: 6.5 10*3/uL (ref 3.4–10.8)

## 2014-12-23 LAB — COMPREHENSIVE METABOLIC PANEL
A/G RATIO: 1.7 (ref 1.1–2.5)
ALT: 7 IU/L (ref 0–44)
AST: 9 IU/L (ref 0–40)
Albumin: 3.9 g/dL (ref 3.6–4.8)
Alkaline Phosphatase: 83 IU/L (ref 39–117)
BILIRUBIN TOTAL: 0.3 mg/dL (ref 0.0–1.2)
BUN/Creatinine Ratio: 15 (ref 10–22)
BUN: 19 mg/dL (ref 8–27)
CALCIUM: 9.5 mg/dL (ref 8.6–10.2)
CHLORIDE: 98 mmol/L (ref 97–108)
CO2: 23 mmol/L (ref 18–29)
Creatinine, Ser: 1.25 mg/dL (ref 0.76–1.27)
GFR, EST AFRICAN AMERICAN: 67 mL/min/{1.73_m2} (ref >59)
GFR, EST NON AFRICAN AMERICAN: 58 mL/min/{1.73_m2} — AB (ref >59)
GLOBULIN, TOTAL: 2.3 g/dL (ref 1.5–4.5)
Glucose: 111 mg/dL — ABNORMAL HIGH (ref 65–99)
POTASSIUM: 4.7 mmol/L (ref 3.5–5.2)
SODIUM: 137 mmol/L (ref 134–144)
TOTAL PROTEIN: 6.2 g/dL (ref 6.0–8.5)

## 2014-12-23 LAB — URIC ACID: Uric Acid: 5 mg/dL (ref 3.7–8.6)

## 2014-12-24 LAB — SPECIMEN STATUS REPORT

## 2014-12-24 LAB — LIPID PANEL W/O CHOL/HDL RATIO
Cholesterol, Total: 193 mg/dL (ref 100–199)
HDL: 27 mg/dL — ABNORMAL LOW (ref >39)
Triglycerides: 480 mg/dL — ABNORMAL HIGH (ref 0–149)

## 2015-03-28 ENCOUNTER — Encounter: Payer: Self-pay | Admitting: Unknown Physician Specialty

## 2015-03-28 ENCOUNTER — Ambulatory Visit (INDEPENDENT_AMBULATORY_CARE_PROVIDER_SITE_OTHER): Payer: Commercial Managed Care - HMO | Admitting: Unknown Physician Specialty

## 2015-03-28 VITALS — BP 123/78 | HR 68 | Temp 98.1°F | Ht 68.0 in | Wt 199.2 lb

## 2015-03-28 DIAGNOSIS — N183 Chronic kidney disease, stage 3 (moderate): Secondary | ICD-10-CM

## 2015-03-28 DIAGNOSIS — E1122 Type 2 diabetes mellitus with diabetic chronic kidney disease: Secondary | ICD-10-CM

## 2015-03-28 DIAGNOSIS — Z23 Encounter for immunization: Secondary | ICD-10-CM | POA: Diagnosis not present

## 2015-03-28 DIAGNOSIS — E785 Hyperlipidemia, unspecified: Secondary | ICD-10-CM

## 2015-03-28 DIAGNOSIS — Z0000 Encounter for general adult medical examination without abnormal findings: Secondary | ICD-10-CM | POA: Diagnosis not present

## 2015-03-28 DIAGNOSIS — F17200 Nicotine dependence, unspecified, uncomplicated: Secondary | ICD-10-CM

## 2015-03-28 DIAGNOSIS — R351 Nocturia: Secondary | ICD-10-CM

## 2015-03-28 DIAGNOSIS — I129 Hypertensive chronic kidney disease with stage 1 through stage 4 chronic kidney disease, or unspecified chronic kidney disease: Secondary | ICD-10-CM

## 2015-03-28 LAB — BAYER DCA HB A1C WAIVED: HB A1C: 6.4 % (ref <7.0)

## 2015-03-28 NOTE — Progress Notes (Signed)
BP 123/78 mmHg  Pulse 68  Temp(Src) 98.1 F (36.7 C)  Ht '5\' 8"'$  (1.727 m)  Wt 199 lb 3.2 oz (90.357 kg)  BMI 30.30 kg/m2  SpO2 94%   Subjective:    Patient ID: Philip Wood, male    DOB: Dec 22, 1945, 69 y.o.   MRN: [id number]  HPI: Philip Wood is a 69 y.o. male  Chief Complaint  Patient presents with  . Medicare Wellness   Depression screen [facility] 2/9 03/28/2015  Decreased Interest 0  Down, Depressed, Hopeless 0  PHQ - 2 Score 0    Functional Status Survey: Is the patient deaf or have difficulty hearing?: No Does the patient have difficulty seeing, even when wearing glasses/contacts?: No (pt states he cannot see some things without his glasses) Does the patient have difficulty concentrating, remembering, or making decisions?: No Does the patient have difficulty walking or climbing stairs?: No Does the patient have difficulty dressing or bathing?: No Does the patient have difficulty doing errands alone such as visiting a doctor's office or shopping?: No  Fall Risk  03/28/2015  Falls in the past year? No    Relevant past medical, surgical, family and social history reviewed and updated as indicated. Interim medical history since our last visit reviewed. Allergies and medications reviewed and updated.  See functional status, depression screen, and fall's risk assessment  under the appropriate section.    Pt is able to perform complex mental tasks, recognize clock face, recognize time and do a 3 item recall.    Diabetes:  Using medications without difficulties No hypoglycemic episodes No hyperglycemic episodes No Feet problems Blood Sugars averaging: Not checking eye exam within last year  Hypertension:  Using medications without difficulty Average home BPs Not checking   Using medication without problems or lightheadedness No chest pain with exertion or shortness of breath No Edema Average home BPs:  Elevated Cholesterol: Using medications without  problems: No Muscle aches: none Diet compliance: Good.  Lost 2 pounds Exercise:  work  Tobacco use daily at 1/2 ppd.  Olivia Mackie daily which helps    Relevant past medical, surgical, family and social history reviewed and updated as indicated. Interim medical history since our last visit reviewed. Allergies and medications reviewed and updated.  Review of Systems  Constitutional: Negative.   HENT: Negative.   Eyes: Negative.   Respiratory: Negative.   Cardiovascular: Negative.   Gastrointestinal: Negative.   Endocrine: Negative.   Genitourinary:       Frequency and nocturia  Skin: Negative.   Allergic/Immunologic: Negative.   Neurological: Negative.   Hematological: Negative.   Psychiatric/Behavioral: Negative.     Per HPI unless specifically indicated above     Objective:    BP 123/78 mmHg  Pulse 68  Temp(Src) 98.1 F (36.7 C)  Ht '5\' 8"'$  (1.727 m)  Wt 199 lb 3.2 oz (90.357 kg)  BMI 30.30 kg/m2  SpO2 94%  Wt Readings from Last 3 Encounters:  03/28/15 199 lb 3.2 oz (90.357 kg)  12/22/14 202 lb (91.627 kg)  06/20/14 203 lb (92.08 kg)    Physical Exam  Constitutional: He is oriented to person, place, and time. He appears well-developed and well-nourished.  HENT:  Head: Normocephalic.  Eyes: Pupils are equal, round, and reactive to light.  Cardiovascular: Normal rate, regular rhythm and normal heart sounds.   Pulmonary/Chest: Effort normal.  Abdominal: Soft. Bowel sounds are normal.  Musculoskeletal: Normal range of motion.  Neurological: He is alert and oriented to  person, place, and time. He has normal reflexes.  Skin: Skin is warm and dry.  Psychiatric: He has a normal mood and affect. His behavior is normal. Judgment and thought content normal.    Results for orders placed or performed in visit on 12/22/14  Bayer DCA Hb A1c Waived  Result Value Ref Range   Bayer DCA Hb A1c Waived 7.0 (H) <7.0 %  Microalbumin, Urine Waived  Result Value Ref Range    Microalb, Ur Waived 10 0 - 19 mg/L   Creatinine, Urine Waived 50 10 - 300 mg/dL   Microalb/Creat Ratio 30-300 (H) <30 mg/g  Uric acid  Result Value Ref Range   Uric Acid 5.0 3.7 - 8.6 mg/dL  CBC with Differential/Platelet  Result Value Ref Range   WBC 6.5 3.4 - 10.8 x10E3/uL   RBC 4.78 4.14 - 5.80 x10E6/uL   Hemoglobin 15.3 12.6 - 17.7 g/dL   Hematocrit 43.8 37.5 - 51.0 %   MCV 92 79 - 97 fL   MCH 32.0 26.6 - 33.0 pg   MCHC 34.9 31.5 - 35.7 g/dL   RDW 14.0 12.3 - 15.4 %   Platelets 188 150 - 379 x10E3/uL   Neutrophils 49 %   Lymphs 34 %   Monocytes 10 %   Eos 7 %   Basos 0 %   Neutrophils Absolute 3.1 1.4 - 7.0 x10E3/uL   Lymphocytes Absolute 2.2 0.7 - 3.1 x10E3/uL   Monocytes Absolute 0.7 0.1 - 0.9 x10E3/uL   EOS (ABSOLUTE) 0.5 (H) 0.0 - 0.4 x10E3/uL   Basophils Absolute 0.0 0.0 - 0.2 x10E3/uL   Immature Granulocytes 0 %   Immature Grans (Abs) 0.0 0.0 - 0.1 x10E3/uL  Comprehensive metabolic panel  Result Value Ref Range   Glucose 111 (H) 65 - 99 mg/dL   BUN 19 8 - 27 mg/dL   Creatinine, Ser 1.25 0.76 - 1.27 mg/dL   GFR calc non Af Amer 58 (L) >59 mL/min/1.73   GFR calc Af Amer 67 >59 mL/min/1.73   BUN/Creatinine Ratio 15 10 - 22   Sodium 137 134 - 144 mmol/L   Potassium 4.7 3.5 - 5.2 mmol/L   Chloride 98 97 - 108 mmol/L   CO2 23 18 - 29 mmol/L   Calcium 9.5 8.6 - 10.2 mg/dL   Total Protein 6.2 6.0 - 8.5 g/dL   Albumin 3.9 3.6 - 4.8 g/dL   Globulin, Total 2.3 1.5 - 4.5 g/dL   Albumin/Globulin Ratio 1.7 1.1 - 2.5   Bilirubin Total 0.3 0.0 - 1.2 mg/dL   Alkaline Phosphatase 83 39 - 117 IU/L   AST 9 0 - 40 IU/L   ALT 7 0 - 44 IU/L  Specimen status report  Result Value Ref Range   specimen status report Comment   Lipid Panel w/o Chol/HDL Ratio  Result Value Ref Range   Cholesterol, Total 193 100 - 199 mg/dL   Triglycerides 480 (H) 0 - 149 mg/dL   HDL 27 (L) >39 mg/dL   LDL Calculated Comment 0 - 99 mg/dL      Assessment & Plan:   Problem List Items Addressed  This Visit      Unprioritized   Diabetes mellitus with chronic kidney disease ([place])    Check Hgb A1C      Relevant Orders   Comprehensive metabolic panel   Bayer DCA Hb A1c Waived   Hyperlipidemia   Relevant Orders   Lipid Panel w/o Chol/HDL Ratio   Hypertensive CKD (chronic kidney  disease)    Stable, continue present medications.       Relevant Orders   Comprehensive metabolic panel   Tobacco use disorder    Pt ed. On quitting.  Refusing low dose CT       Other Visit Diagnoses    Immunization due    -  Primary    Relevant Orders    Flu Vaccine QUAD 36+ mos IM (Completed)    Nocturia        Relevant Orders    PSA    Routine general medical examination at a health care facility        Relevant Orders    Hepatitis C antibody       Diagnosis stable.  Continue current medications.   Follow up plan: Return in about 3 months (around 06/26/2015).

## 2015-03-28 NOTE — Assessment & Plan Note (Signed)
Stable, continue present medications.   

## 2015-03-28 NOTE — Assessment & Plan Note (Signed)
Pt ed. On quitting.  Refusing low dose CT

## 2015-03-28 NOTE — Assessment & Plan Note (Signed)
Check Hgb A1C 

## 2015-03-29 LAB — COMPREHENSIVE METABOLIC PANEL
ALBUMIN: 4.1 g/dL (ref 3.6–4.8)
ALK PHOS: 76 IU/L (ref 39–117)
ALT: 7 IU/L (ref 0–44)
AST: 11 IU/L (ref 0–40)
Albumin/Globulin Ratio: 2.2 (ref 1.1–2.5)
BUN / CREAT RATIO: 16 (ref 10–22)
BUN: 19 mg/dL (ref 8–27)
Bilirubin Total: 0.4 mg/dL (ref 0.0–1.2)
CO2: 26 mmol/L (ref 18–29)
CREATININE: 1.22 mg/dL (ref 0.76–1.27)
Calcium: 9.2 mg/dL (ref 8.6–10.2)
Chloride: 98 mmol/L (ref 97–106)
GFR, EST AFRICAN AMERICAN: 69 mL/min/{1.73_m2} (ref >59)
GFR, EST NON AFRICAN AMERICAN: 60 mL/min/{1.73_m2} (ref >59)
GLOBULIN, TOTAL: 1.9 g/dL (ref 1.5–4.5)
Glucose: 106 mg/dL — ABNORMAL HIGH (ref 65–99)
Potassium: 5 mmol/L (ref 3.5–5.2)
SODIUM: 137 mmol/L (ref 136–144)
TOTAL PROTEIN: 6 g/dL (ref 6.0–8.5)

## 2015-03-29 LAB — LIPID PANEL W/O CHOL/HDL RATIO
CHOLESTEROL TOTAL: 170 mg/dL (ref 100–199)
HDL: 31 mg/dL — ABNORMAL LOW (ref >39)
LDL CALC: 82 mg/dL (ref 0–99)
Triglycerides: 284 mg/dL — ABNORMAL HIGH (ref 0–149)
VLDL CHOLESTEROL CAL: 57 mg/dL — AB (ref 5–40)

## 2015-03-29 LAB — PSA: Prostate Specific Ag, Serum: 1.7 ng/mL (ref 0.0–4.0)

## 2015-03-29 LAB — HEPATITIS C ANTIBODY: Hep C Virus Ab: <0.1 s/co ratio (ref 0.0–0.9)

## 2015-03-30 ENCOUNTER — Encounter: Payer: Self-pay | Admitting: Unknown Physician Specialty

## 2015-03-30 NOTE — Progress Notes (Signed)
Quick Note:  Normal labs. Patient notified by letter. ______

## 2015-06-25 ENCOUNTER — Encounter: Payer: Commercial Managed Care - HMO | Admitting: Unknown Physician Specialty

## 2015-06-27 ENCOUNTER — Other Ambulatory Visit: Payer: Self-pay | Admitting: Unknown Physician Specialty

## 2015-06-27 ENCOUNTER — Ambulatory Visit: Payer: Self-pay | Admitting: Unknown Physician Specialty

## 2015-06-27 MED ORDER — TIOTROPIUM BROMIDE MONOHYDRATE 18 MCG IN CAPS: 18.0000 ug | ORAL_CAPSULE | Freq: Every day | RESPIRATORY_TRACT | Status: DC

## 2015-06-27 NOTE — Telephone Encounter (Signed)
Routing to provider  

## 2015-06-27 NOTE — Telephone Encounter (Signed)
Pt came in late for his appt, had to reschedule for 07/11/15. Pt needs refill on Spiriva to last until appt date. Pharm is Paediatric nurse on [facility]. Thanks.

## 2015-07-11 ENCOUNTER — Encounter: Payer: Self-pay | Admitting: Unknown Physician Specialty

## 2015-07-11 ENCOUNTER — Ambulatory Visit (INDEPENDENT_AMBULATORY_CARE_PROVIDER_SITE_OTHER): Payer: Commercial Managed Care - HMO | Admitting: Unknown Physician Specialty

## 2015-07-11 VITALS — BP 130/77 | HR 74 | Temp 98.5°F | Ht 68.2 in | Wt 198.4 lb

## 2015-07-11 DIAGNOSIS — N183 Chronic kidney disease, stage 3 (moderate): Secondary | ICD-10-CM | POA: Diagnosis not present

## 2015-07-11 DIAGNOSIS — E0822 Diabetes mellitus due to underlying condition with diabetic chronic kidney disease: Secondary | ICD-10-CM

## 2015-07-11 DIAGNOSIS — I129 Hypertensive chronic kidney disease with stage 1 through stage 4 chronic kidney disease, or unspecified chronic kidney disease: Secondary | ICD-10-CM | POA: Diagnosis not present

## 2015-07-11 DIAGNOSIS — J449 Chronic obstructive pulmonary disease, unspecified: Secondary | ICD-10-CM

## 2015-07-11 DIAGNOSIS — E785 Hyperlipidemia, unspecified: Secondary | ICD-10-CM

## 2015-07-11 LAB — BAYER DCA HB A1C WAIVED: HB A1C (BAYER DCA - WAIVED): 6.1 % (ref <7.0)

## 2015-07-11 MED ORDER — METFORMIN HCL 500 MG PO TABS: 500.0000 mg | ORAL_TABLET | Freq: Two times a day (BID) | ORAL | Status: DC

## 2015-07-11 MED ORDER — TIOTROPIUM BROMIDE MONOHYDRATE 18 MCG IN CAPS: 18.0000 ug | ORAL_CAPSULE | Freq: Every day | RESPIRATORY_TRACT | Status: DC

## 2015-07-11 MED ORDER — ALLOPURINOL 100 MG PO TABS: 100.0000 mg | ORAL_TABLET | Freq: Every day | ORAL | Status: DC

## 2015-07-11 MED ORDER — METOPROLOL TARTRATE 25 MG PO TABS: 25.0000 mg | ORAL_TABLET | Freq: Two times a day (BID) | ORAL | Status: DC

## 2015-07-11 MED ORDER — RAMIPRIL 5 MG PO CAPS: 5.0000 mg | ORAL_CAPSULE | Freq: Every day | ORAL | Status: DC

## 2015-07-11 NOTE — Assessment & Plan Note (Signed)
Not currently on medication. Continue to monitor.

## 2015-07-11 NOTE — Assessment & Plan Note (Signed)
Stable, continue present medications.   

## 2015-07-11 NOTE — Assessment & Plan Note (Signed)
Today's A1c 6.1. Stable, continue present medications.

## 2015-07-11 NOTE — Assessment & Plan Note (Signed)
Symptoms well controlled. Continue present medication.

## 2015-07-11 NOTE — Progress Notes (Signed)
BP 130/77 mmHg  Pulse 74  Temp(Src) 98.5 F (36.9 C)  Ht 5' 8.2" (1.732 m)  Wt 198 lb 6.4 oz (89.994 kg)  BMI 30.00 kg/m2  SpO2 93%   Subjective:    Patient ID: Philip Wood, male    DOB: Dec 07, 1945, 70 y.o.   MRN: [id number]  HPI: Philip Wood is a 70 y.o. male  Chief Complaint  Patient presents with  . Diabetes  . Hypertension  . Medication Refill    pt states he needs all meds refilled    Diabetes:   Last A1c: 6.4  Using medications without difficulties No hypoglycemic episodes No hyperglycemic episodes Feet problems: Occasionally itches, but no other issues Blood Sugars averaging: Only checks when "he feels funny" and unsure of number Eye exam within last year: annually gets in May or June  Hypertension:  Using medications without difficulty Average home BPs: doesn't take   Using medication without problems or lightheadedness No chest pain with exertion. Occasional SOB, likely related to smoking cigarettes No Edema  Elevated Cholesterol: Not currently on medication Diet compliance: Eats out twice a week and eats at home the rest of the time. Drinks 2 Pepsi cans a day. No added sugar in meals.  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   Relevant past medical, surgical, family and social history reviewed and updated as indicated. Interim medical history since our last visit reviewed. Allergies and medications reviewed and updated.  Review of Systems  Constitutional: Negative.   HENT: Negative.   Eyes: Negative.   Respiratory: Negative.   Cardiovascular: Negative.   Gastrointestinal: Negative.   Endocrine: Negative.   Genitourinary: Positive for frequency.  Skin:  Negative.   Allergic/Immunologic: Negative.   Neurological: Negative.   Hematological: Negative.   Psychiatric/Behavioral: Negative.     Per HPI unless specifically indicated above     Objective:    BP 130/77 mmHg  Pulse 74  Temp(Src) 98.5 F (36.9 C)  Ht 5' 8.2" (1.732 m)  Wt 198 lb 6.4 oz (89.994 kg)  BMI 30.00 kg/m2  SpO2 93%  Wt Readings from Last 3 Encounters:  07/11/15 198 lb 6.4 oz (89.994 kg)  03/28/15 199 lb 3.2 oz (90.357 kg)  12/22/14 202 lb (91.627 kg)    Physical Exam  Constitutional: He is oriented to person, place, and time. He appears well-developed and well-nourished. No distress.  HENT:  Head: Normocephalic and atraumatic.  Eyes: Conjunctivae and lids are normal. Right eye exhibits no discharge. Left eye exhibits no discharge. No scleral icterus.  Neck: Normal range of motion. Neck supple. No JVD present. Carotid bruit is not present.  Cardiovascular: Normal rate, regular rhythm and normal heart sounds.   Pulmonary/Chest: Effort normal and breath sounds normal. No respiratory distress.  Abdominal: Normal appearance. There is no splenomegaly or hepatomegaly.  Musculoskeletal: Normal range of motion.  Neurological: He is alert and oriented to person, place, and time.  Skin: Skin is warm, dry and intact. No rash noted. No pallor.  Psychiatric: He has a normal mood and affect. His behavior is normal. Judgment and thought content normal.        Assessment & Plan:   Problem List Items Addressed This Visit  Unprioritized   Diabetes mellitus with chronic kidney disease ([place]) - Primary    Today's A1c 6.1. Stable, continue present medications.        Relevant Orders   Bayer DCA Hb A1c Waived   Hyperlipidemia    Not currently on medication. Continue to monitor.       COPD (chronic obstructive pulmonary disease) (H[facility])    Symptoms well controlled. Continue present medication.       Hypertensive CKD (chronic kidney disease)    Stable, continue  present medications.        Relevant Orders   Comprehensive metabolic panel       Follow up plan: Return in about 6 months (around 01/11/2016).

## 2015-07-12 LAB — COMPREHENSIVE METABOLIC PANEL
A/G RATIO: 1.9 (ref 1.2–2.2)
ALT: 8 IU/L (ref 0–44)
AST: 14 IU/L (ref 0–40)
Albumin: 3.9 g/dL (ref 3.6–4.8)
Alkaline Phosphatase: 76 IU/L (ref 39–117)
BUN/Creatinine Ratio: 15 (ref 10–22)
BUN: 18 mg/dL (ref 8–27)
Bilirubin Total: 0.4 mg/dL (ref 0.0–1.2)
CALCIUM: 9.5 mg/dL (ref 8.6–10.2)
CO2: 27 mmol/L (ref 18–29)
Chloride: 99 mmol/L (ref 96–106)
Creatinine, Ser: 1.2 mg/dL (ref 0.76–1.27)
GFR, EST AFRICAN AMERICAN: 71 mL/min/{1.73_m2} (ref >59)
GFR, EST NON AFRICAN AMERICAN: 61 mL/min/{1.73_m2} (ref >59)
GLOBULIN, TOTAL: 2.1 g/dL (ref 1.5–4.5)
Glucose: 139 mg/dL — ABNORMAL HIGH (ref 65–99)
POTASSIUM: 5.1 mmol/L (ref 3.5–5.2)
Sodium: 139 mmol/L (ref 134–144)
TOTAL PROTEIN: 6 g/dL (ref 6.0–8.5)

## 2015-08-24 ENCOUNTER — Telehealth: Payer: Self-pay | Admitting: Unknown Physician Specialty

## 2015-08-24 DIAGNOSIS — C140 Malignant neoplasm of pharynx, unspecified: Secondary | ICD-10-CM

## 2015-08-24 NOTE — Telephone Encounter (Signed)
Order in.

## 2015-08-24 NOTE — Telephone Encounter (Signed)
Called and spoke to patient. 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

## 2015-08-24 NOTE — Telephone Encounter (Signed)
Pt called stated he needs a referral to [facility] for a check up, pt stated he has a check up with them every 6 months. Thanks.

## 2015-08-24 NOTE — Telephone Encounter (Signed)
Pt would like to get a referral to go to [facility]. appt scheduled for may 8

## 2015-08-24 NOTE — Telephone Encounter (Signed)
Called patient because we need to know why this referral is needed. A lady answered the phone and stated the patient was not in and I asked for him to give me a call when he comes in.

## 2015-10-29 ENCOUNTER — Telehealth: Payer: Self-pay | Admitting: Unknown Physician Specialty

## 2015-10-29 DIAGNOSIS — E0822 Diabetes mellitus due to underlying condition with diabetic chronic kidney disease: Secondary | ICD-10-CM

## 2015-10-29 NOTE — Telephone Encounter (Signed)
Pt would like to get a referral to go to [facility] to see Leandrew Koyanagi. appt 11/09/2015

## 2015-10-29 NOTE — Telephone Encounter (Signed)
Called and asked patient why he was going to see [place]. Patient stated that it was for his yearly diabetic check up.

## 2015-10-29 NOTE — Telephone Encounter (Signed)
done

## 2015-11-09 LAB — HM DIABETES EYE EXAM

## 2016-01-11 ENCOUNTER — Other Ambulatory Visit: Payer: Self-pay

## 2016-01-11 ENCOUNTER — Encounter: Payer: Self-pay | Admitting: Unknown Physician Specialty

## 2016-01-11 ENCOUNTER — Ambulatory Visit (INDEPENDENT_AMBULATORY_CARE_PROVIDER_SITE_OTHER): Payer: Commercial Managed Care - HMO | Admitting: Unknown Physician Specialty

## 2016-01-11 VITALS — BP 111/79 | HR 67 | Temp 97.4°F | Ht 70.5 in | Wt 196.0 lb

## 2016-01-11 DIAGNOSIS — Z23 Encounter for immunization: Secondary | ICD-10-CM | POA: Diagnosis not present

## 2016-01-11 DIAGNOSIS — J449 Chronic obstructive pulmonary disease, unspecified: Secondary | ICD-10-CM | POA: Diagnosis not present

## 2016-01-11 DIAGNOSIS — E0822 Diabetes mellitus due to underlying condition with diabetic chronic kidney disease: Secondary | ICD-10-CM | POA: Diagnosis not present

## 2016-01-11 DIAGNOSIS — E785 Hyperlipidemia, unspecified: Secondary | ICD-10-CM | POA: Diagnosis not present

## 2016-01-11 DIAGNOSIS — I129 Hypertensive chronic kidney disease with stage 1 through stage 4 chronic kidney disease, or unspecified chronic kidney disease: Secondary | ICD-10-CM

## 2016-01-11 LAB — LIPID PANEL PICCOLO, WAIVED
CHOL/HDL RATIO PICCOLO,WAIVE: 4.1 mg/dL
CHOLESTEROL PICCOLO, WAIVED: 175 mg/dL (ref <200)
HDL Chol Piccolo, Waived: 43 mg/dL — ABNORMAL LOW (ref >59)
LDL Chol Calc Piccolo Waived: 59 mg/dL (ref <100)
Triglycerides Piccolo,Waived: 364 mg/dL — ABNORMAL HIGH (ref <150)
VLDL Chol Calc Piccolo,Waive: 73 mg/dL — ABNORMAL HIGH (ref <30)

## 2016-01-11 LAB — MICROALBUMIN, URINE WAIVED
CREATININE, URINE WAIVED: 100 mg/dL (ref 10–300)
MICROALB, UR WAIVED: 30 mg/L — AB (ref 0–19)

## 2016-01-11 LAB — HEMOGLOBIN A1C

## 2016-01-11 LAB — BAYER DCA HB A1C WAIVED: HB A1C: 6.4 % (ref <7.0)

## 2016-01-11 MED ORDER — RAMIPRIL 5 MG PO CAPS: 5.0000 mg | ORAL_CAPSULE | Freq: Every day | ORAL | 1 refills | Status: DC

## 2016-01-11 MED ORDER — TIOTROPIUM BROMIDE MONOHYDRATE 18 MCG IN CAPS: 18.0000 ug | ORAL_CAPSULE | Freq: Every day | RESPIRATORY_TRACT | 1 refills | Status: DC

## 2016-01-11 MED ORDER — ATORVASTATIN CALCIUM 20 MG PO TABS: 20.0000 mg | ORAL_TABLET | Freq: Every day | ORAL | 3 refills | Status: DC

## 2016-01-11 MED ORDER — ALLOPURINOL 100 MG PO TABS: 100.0000 mg | ORAL_TABLET | Freq: Every day | ORAL | 1 refills | Status: DC

## 2016-01-11 MED ORDER — METOPROLOL TARTRATE 25 MG PO TABS: 25.0000 mg | ORAL_TABLET | Freq: Two times a day (BID) | ORAL | 1 refills | Status: DC

## 2016-01-11 MED ORDER — METFORMIN HCL 500 MG PO TABS: 500.0000 mg | ORAL_TABLET | Freq: Two times a day (BID) | ORAL | 1 refills | Status: DC

## 2016-01-11 NOTE — Assessment & Plan Note (Addendum)
Reviewed high risk for cardiovascular event.  LDL is only 54.  Will rx Atorvastatin 20 mg.

## 2016-01-11 NOTE — Assessment & Plan Note (Signed)
Stable, continue present medications.   

## 2016-01-11 NOTE — Patient Instructions (Addendum)

## 2016-01-11 NOTE — Progress Notes (Signed)
BP 111/79 (BP Location: Left Arm, Patient Position: Sitting, Cuff Size: Large)   Pulse 67   Temp 97.4 F (36.3 C)   Ht 5' 10.5" (1.791 m)   Wt 196 lb (88.9 kg)   SpO2 95%   BMI 27.73 kg/m    Subjective:    Patient ID: Philip Wood, male    DOB: November 23, 1945, 70 y.o.   MRN: [id number]  HPI: Philip Wood is a 70 y.o. male  Chief Complaint  Patient presents with  . Diabetes  . Hypertension  . COPD   Diabetes: Using medications without difficulties No hypoglycemic episodes No hyperglycemic episodes Feet problems: none Blood Sugars averaging: eye exam within last year Last Hgb A1C: 6.1  Hypertension  Using medications without difficulty Average home BPs Not checking  Using medication without problems or lightheadedness No chest pain with exertion or shortness of breath No Edema  Elevated Cholesterol Using medications without problems No Muscle aches  Diet: Limits sugar Exercise: none  COPD Feels symptoms are well controlled on Spireva      Relevant past medical, surgical, family and social history reviewed and updated as indicated. Interim medical history since our last visit reviewed. Allergies and medications reviewed and updated.  Review of Systems  Per HPI unless specifically indicated above     Objective:    BP 111/79 (BP Location: Left Arm, Patient Position: Sitting, Cuff Size: Large)   Pulse 67   Temp 97.4 F (36.3 C)   Ht 5' 10.5" (1.791 m)   Wt 196 lb (88.9 kg)   SpO2 95%   BMI 27.73 kg/m   Wt Readings from Last 3 Encounters:  01/11/16 196 lb (88.9 kg)  07/11/15 198 lb 6.4 oz (90 kg)  03/28/15 199 lb 3.2 oz (90.4 kg)    Physical Exam  Constitutional: He is oriented to person, place, and time. He appears well-developed and well-nourished. No distress.  HENT:  Head: Normocephalic and atraumatic.  Eyes: Conjunctivae and lids are normal. Right eye exhibits no discharge. Left eye exhibits no discharge. No scleral icterus.  Neck:  Normal range of motion. Neck supple. No JVD present. Carotid bruit is not present.  Cardiovascular: Normal rate, regular rhythm and normal heart sounds.   Pulmonary/Chest: Effort normal and breath sounds normal. No respiratory distress.  Abdominal: Normal appearance. There is no splenomegaly or hepatomegaly.  Musculoskeletal: Normal range of motion.  Neurological: He is alert and oriented to person, place, and time.  Skin: Skin is warm, dry and intact. No rash noted. No pallor.  Psychiatric: He has a normal mood and affect. His behavior is normal. Judgment and thought content normal.    Results for orders placed or performed in visit on 11/15/15  HM DIABETES EYE EXAM  Result Value Ref Range   HM Diabetic Eye Exam No Retinopathy No Retinopathy      Assessment & Plan:   Problem List Items Addressed This Visit      Unprioritized   COPD (chronic obstructive pulmonary disease) ([place])    Stable, continue present medications.        Relevant Medications   tiotropium (SPIRIVA) 18 MCG inhalation capsule   Diabetes mellitus with chronic kidney disease ([place])    Stable, continue present medications.        Relevant Medications   metFORMIN (GLUCOPHAGE) 500 MG tablet   ramipril (ALTACE) 5 MG capsule   atorvastatin (LIPITOR) 20 MG tablet   Other Relevant Orders   Bayer DCA Hb A1c Waived  Microalbumin, Urine Waived   Comprehensive metabolic panel   Hyperlipidemia    Reviewed high risk for cardiovascular event.  LDL is only 54.  Will rx Atorvastatin 20 mg.        Relevant Medications   metoprolol tartrate (LOPRESSOR) 25 MG tablet   ramipril (ALTACE) 5 MG capsule   atorvastatin (LIPITOR) 20 MG tablet   Other Relevant Orders   Lipid Panel [place], Waived   Comprehensive metabolic panel   Hypertensive CKD (chronic kidney disease)    Stable, continue present medications.        Relevant Orders   Microalbumin, Urine Waived   Comprehensive metabolic panel    Other Visit  Diagnoses    Need for influenza vaccination    -  Primary   Relevant Orders   Flu vaccine HIGH DOSE PF (Completed)       Follow up plan: Return in about 6 months (around 07/10/2016).

## 2016-01-12 LAB — COMPREHENSIVE METABOLIC PANEL
ALK PHOS: 78 IU/L (ref 39–117)
ALT: 11 IU/L (ref 0–44)
AST: 12 IU/L (ref 0–40)
Albumin/Globulin Ratio: 1.6 (ref 1.2–2.2)
Albumin: 3.9 g/dL (ref 3.5–4.8)
BUN/Creatinine Ratio: 16 (ref 10–24)
BUN: 18 mg/dL (ref 8–27)
Bilirubin Total: 0.4 mg/dL (ref 0.0–1.2)
CALCIUM: 9.4 mg/dL (ref 8.6–10.2)
CO2: 26 mmol/L (ref 18–29)
CREATININE: 1.16 mg/dL (ref 0.76–1.27)
Chloride: 97 mmol/L (ref 96–106)
GFR calc Af Amer: 73 mL/min/{1.73_m2} (ref >59)
GFR, EST NON AFRICAN AMERICAN: 63 mL/min/{1.73_m2} (ref >59)
GLUCOSE: 115 mg/dL — AB (ref 65–99)
Globulin, Total: 2.4 g/dL (ref 1.5–4.5)
Potassium: 5.1 mmol/L (ref 3.5–5.2)
SODIUM: 140 mmol/L (ref 134–144)
Total Protein: 6.3 g/dL (ref 6.0–8.5)

## 2016-01-14 ENCOUNTER — Encounter: Payer: Self-pay | Admitting: Unknown Physician Specialty

## 2016-01-14 NOTE — Progress Notes (Signed)
Pt notified by letter

## 2016-02-08 ENCOUNTER — Other Ambulatory Visit: Payer: Self-pay | Admitting: Unknown Physician Specialty

## 2016-02-08 NOTE — Telephone Encounter (Signed)
Your patient 

## 2016-02-19 ENCOUNTER — Other Ambulatory Visit: Payer: Self-pay | Admitting: Unknown Physician Specialty

## 2016-03-03 ENCOUNTER — Telehealth: Payer: Self-pay | Admitting: Unknown Physician Specialty

## 2016-03-03 NOTE — Telephone Encounter (Signed)
Pt needs authorizations to go to [facility] to see Beverly Gust on 03/06/16.

## 2016-03-03 NOTE — Telephone Encounter (Signed)
Routing to [facility].

## 2016-03-04 NOTE — Telephone Encounter (Signed)
Approved w/ Dr. Tami Ribas.   Authorization #[id number]

## 2016-07-11 ENCOUNTER — Encounter: Payer: Self-pay | Admitting: Unknown Physician Specialty

## 2016-07-11 ENCOUNTER — Ambulatory Visit (INDEPENDENT_AMBULATORY_CARE_PROVIDER_SITE_OTHER): Payer: Medicare Other | Admitting: Unknown Physician Specialty

## 2016-07-11 VITALS — BP 147/91 | HR 71 | Temp 97.7°F | Ht 69.1 in | Wt 194.0 lb

## 2016-07-11 DIAGNOSIS — Z7189 Other specified counseling: Secondary | ICD-10-CM | POA: Diagnosis not present

## 2016-07-11 DIAGNOSIS — Z0000 Encounter for general adult medical examination without abnormal findings: Secondary | ICD-10-CM

## 2016-07-11 DIAGNOSIS — I129 Hypertensive chronic kidney disease with stage 1 through stage 4 chronic kidney disease, or unspecified chronic kidney disease: Secondary | ICD-10-CM

## 2016-07-11 DIAGNOSIS — J449 Chronic obstructive pulmonary disease, unspecified: Secondary | ICD-10-CM

## 2016-07-11 DIAGNOSIS — F17200 Nicotine dependence, unspecified, uncomplicated: Secondary | ICD-10-CM

## 2016-07-11 DIAGNOSIS — E785 Hyperlipidemia, unspecified: Secondary | ICD-10-CM | POA: Diagnosis not present

## 2016-07-11 DIAGNOSIS — E0822 Diabetes mellitus due to underlying condition with diabetic chronic kidney disease: Secondary | ICD-10-CM

## 2016-07-11 DIAGNOSIS — M25512 Pain in left shoulder: Secondary | ICD-10-CM | POA: Diagnosis not present

## 2016-07-11 LAB — BAYER DCA HB A1C WAIVED: HB A1C (BAYER DCA - WAIVED): 6 % (ref <7.0)

## 2016-07-11 MED ORDER — RAMIPRIL 10 MG PO CAPS: 10.0000 mg | ORAL_CAPSULE | Freq: Every day | ORAL | 3 refills | Status: DC

## 2016-07-11 MED ORDER — METFORMIN HCL 500 MG PO TABS: 500.0000 mg | ORAL_TABLET | Freq: Two times a day (BID) | ORAL | 1 refills | Status: DC

## 2016-07-11 MED ORDER — ALLOPURINOL 100 MG PO TABS: 100.0000 mg | ORAL_TABLET | Freq: Every day | ORAL | 1 refills | Status: DC

## 2016-07-11 MED ORDER — ATORVASTATIN CALCIUM 20 MG PO TABS: 20.0000 mg | ORAL_TABLET | Freq: Every day | ORAL | 3 refills | Status: DC

## 2016-07-11 MED ORDER — METOPROLOL TARTRATE 25 MG PO TABS: 25.0000 mg | ORAL_TABLET | Freq: Two times a day (BID) | ORAL | 1 refills | Status: DC

## 2016-07-11 NOTE — Assessment & Plan Note (Signed)
Hgb A1C is 6.  Stable.  Continue current meds

## 2016-07-11 NOTE — Assessment & Plan Note (Signed)
A voluntary discussion about advance care planning including the explanation and discussion of advance directives was extensively discussed  with the patient.  Explanation about the health care proxy and Living will was reviewed and packet with forms with explanation of how to fill them out was given.  During this discussion, the patient was able to identify a health care proxy as his wife.   Patient was offered a separate [place] visit for further assistance with forms.

## 2016-07-11 NOTE — Assessment & Plan Note (Signed)
Pt not willing to quit but has "slacked up"

## 2016-07-11 NOTE — Assessment & Plan Note (Signed)
Stable, continue present medications.   

## 2016-07-11 NOTE — Progress Notes (Signed)
BP (!) 147/91 (BP Location: Left Arm, Cuff Size: Normal)   Pulse 71   Temp 97.7 F (36.5 C)   Ht 5' 9.1" (1.755 m)   Wt 194 lb (88 kg)   SpO2 95%   BMI 28.57 kg/m    Subjective:    Patient ID: Philip Wood, male    DOB: 17-Jul-1945, 71 y.o.   MRN: [id number]  HPI: Philip Wood is a 71 y.o. male  Chief Complaint  Patient presents with  . Medicare Wellness   Diabetes: Using medications without difficulties No hypoglycemic episodes No hyperglycemic episodes Feet problems: Occasional tingling bottom of feet.   Blood Sugars averaging:Not checking Last Hgb A1C: 6.4  Hypertension  Using medications without difficulty Average home BPs Not checking        Using medication without problems or lightheadedness No chest pain with exertion or shortness of breath No Edema  Elevated Cholesterol Using medications without problems No Muscle aches  Diet: Limits sugar Exercise: none  COPD Feels symptoms are well controlled on Spireva.  No rescue inhalers Night time symptoms: none ER visits since last visit:none Increased cough:no Increased SOB:no Using O2:no  Left Shoulder pain Problems for one month.  Difficulty lifting arm and picking something up.  Tylenol not helping  Functional Status Survey: Is the patient deaf or have difficulty hearing?: No Does the patient have difficulty seeing, even when wearing glasses/contacts?: No Does the patient have difficulty concentrating, remembering, or making decisions?: No Does the patient have difficulty walking or climbing stairs?: No Does the patient have difficulty dressing or bathing?: No Does the patient have difficulty doing errands alone such as visiting a doctor's office or shopping?: No  Fall Risk  07/11/2016 03/28/2015  Falls in the past year? No No   Depression screen [facility] 2/9 07/11/2016 03/28/2015  Decreased Interest 0 0  Down, Depressed, Hopeless 0 0  PHQ - 2 Score 0 0  Altered sleeping 0 -  Tired,  decreased energy 0 -  Change in appetite 0 -  Feeling bad or failure about yourself  0 -  Trouble concentrating 0 -  Moving slowly or fidgety/restless 0 -  Suicidal thoughts 0 -  PHQ-9 Score 0 -    Relevant past medical, surgical, family and social history reviewed and updated as indicated. Interim medical history since our last visit reviewed. Allergies and medications reviewed and updated.  Review of Systems  Per HPI unless specifically indicated above     Objective:    BP (!) 147/91 (BP Location: Left Arm, Cuff Size: Normal)   Pulse 71   Temp 97.7 F (36.5 C)   Ht 5' 9.1" (1.755 m)   Wt 194 lb (88 kg)   SpO2 95%   BMI 28.57 kg/m   Wt Readings from Last 3 Encounters:  07/11/16 194 lb (88 kg)  01/11/16 196 lb (88.9 kg)  07/11/15 198 lb 6.4 oz (90 kg)    Physical Exam  Constitutional: He is oriented to person, place, and time. He appears well-developed and well-nourished.  HENT:  Head: Normocephalic.  Right Ear: Tympanic membrane, external ear and ear canal normal.  Left Ear: Tympanic membrane, external ear and ear canal normal.  Mouth/Throat: Uvula is midline, oropharynx is clear and moist and mucous membranes are normal.  Eyes: Pupils are equal, round, and reactive to light.  Cardiovascular: Normal rate, regular rhythm and normal heart sounds.  Exam reveals no gallop and no friction rub.   No murmur heard. Pulmonary/Chest: Effort  normal and breath sounds normal. No respiratory distress.  Abdominal: Soft. Bowel sounds are normal. He exhibits no distension. There is no tenderness.  Musculoskeletal:       Left shoulder: He exhibits decreased range of motion and pain.  Neurological: He is alert and oriented to person, place, and time. He has normal reflexes.  Skin: Skin is warm and dry.  Psychiatric: He has a normal mood and affect. His behavior is normal. Judgment and thought content normal.    Results for orders placed or performed in visit on 01/11/16  Hemoglobin  A1c  Result Value Ref Range   Hemoglobin A1C 6.4%       Assessment & Plan:   Problem List Items Addressed This Visit      Unprioritized   Advanced care planning/counseling discussion    A voluntary discussion about advance care planning including the explanation and discussion of advance directives was extensively discussed  with the patient.  Explanation about the health care proxy and Living will was reviewed and packet with forms with explanation of how to fill them out was given.  During this discussion, the patient was able to identify a health care proxy as his wife.   Patient was offered a separate [place] visit for further assistance with forms.         COPD (chronic obstructive pulmonary disease) (HCC)    Stable, continue present medications.        Diabetes mellitus with chronic kidney disease (HCC) - Primary    Hgb A1C is 6.  Stable.  Continue current meds      Relevant Medications   metFORMIN (GLUCOPHAGE) 500 MG tablet   ramipril (ALTACE) 10 MG capsule   atorvastatin (LIPITOR) 20 MG tablet   Other Relevant Orders   Bayer DCA Hb A1c Waived   Hyperlipidemia   Relevant Medications   ramipril (ALTACE) 10 MG capsule   metoprolol tartrate (LOPRESSOR) 25 MG tablet   atorvastatin (LIPITOR) 20 MG tablet   Other Relevant Orders   Lipid Panel w/o Chol/HDL Ratio   Hypertensive CKD (chronic kidney disease)    Not to goal.  Increase Altace from 5-10 mg      Relevant Orders   Comprehensive metabolic panel   Tobacco use disorder    Pt not willing to quit but has "slacked up"       Other Visit Diagnoses    Annual physical exam       Acute pain of left shoulder       typical inpingement.  X-ray and schedule for injections   Relevant Orders   DG Shoulder Right       Follow up plan: Return in about 6 months (around 01/11/2017) for plus needs an appt for shouder injectiom.

## 2016-07-11 NOTE — Assessment & Plan Note (Signed)
Not to goal.  Increase Altace from 5-10 mg

## 2016-07-12 LAB — COMPREHENSIVE METABOLIC PANEL
A/G RATIO: 1.6 (ref 1.2–2.2)
ALBUMIN: 3.9 g/dL (ref 3.5–4.8)
ALT: 7 IU/L (ref 0–44)
AST: 8 IU/L (ref 0–40)
Alkaline Phosphatase: 95 IU/L (ref 39–117)
BUN / CREAT RATIO: 14 (ref 10–24)
BUN: 16 mg/dL (ref 8–27)
Bilirubin Total: 0.4 mg/dL (ref 0.0–1.2)
CALCIUM: 9.5 mg/dL (ref 8.6–10.2)
CO2: 26 mmol/L (ref 18–29)
CREATININE: 1.18 mg/dL (ref 0.76–1.27)
Chloride: 97 mmol/L (ref 96–106)
GFR, EST AFRICAN AMERICAN: 72 mL/min/{1.73_m2} (ref >59)
GFR, EST NON AFRICAN AMERICAN: 62 mL/min/{1.73_m2} (ref >59)
GLOBULIN, TOTAL: 2.5 g/dL (ref 1.5–4.5)
Glucose: 100 mg/dL — ABNORMAL HIGH (ref 65–99)
POTASSIUM: 4.9 mmol/L (ref 3.5–5.2)
SODIUM: 141 mmol/L (ref 134–144)
TOTAL PROTEIN: 6.4 g/dL (ref 6.0–8.5)

## 2016-07-12 LAB — LIPID PANEL W/O CHOL/HDL RATIO
Cholesterol, Total: 153 mg/dL (ref 100–199)
HDL: 32 mg/dL — ABNORMAL LOW (ref >39)
LDL CALC: 83 mg/dL (ref 0–99)
TRIGLYCERIDES: 192 mg/dL — AB (ref 0–149)
VLDL Cholesterol Cal: 38 mg/dL (ref 5–40)

## 2016-07-14 ENCOUNTER — Ambulatory Visit
Admission: RE | Admit: 2016-07-14 | Discharge: 2016-07-14 | Disposition: A | Discharge status: Home / Self Care | Payer: Medicare Other | Source: Ambulatory Visit | Attending: Unknown Physician Specialty | Admitting: Unknown Physician Specialty

## 2016-07-14 ENCOUNTER — Encounter: Payer: Self-pay | Admitting: Unknown Physician Specialty

## 2016-07-14 ENCOUNTER — Other Ambulatory Visit: Payer: Self-pay

## 2016-07-14 DIAGNOSIS — M25512 Pain in left shoulder: Secondary | ICD-10-CM

## 2016-07-14 DIAGNOSIS — M25812 Other specified joint disorders, left shoulder: Secondary | ICD-10-CM | POA: Diagnosis not present

## 2016-07-14 IMAGING — DX DG SHOULDER 2+V*L*
3 series · 3 of 3 positions shown · non-contrast
Comparison: None.

CLINICAL DATA: Pain for 1 month

EXAM:
LEFT SHOULDER - 2+ VIEW

[shoulder ap]
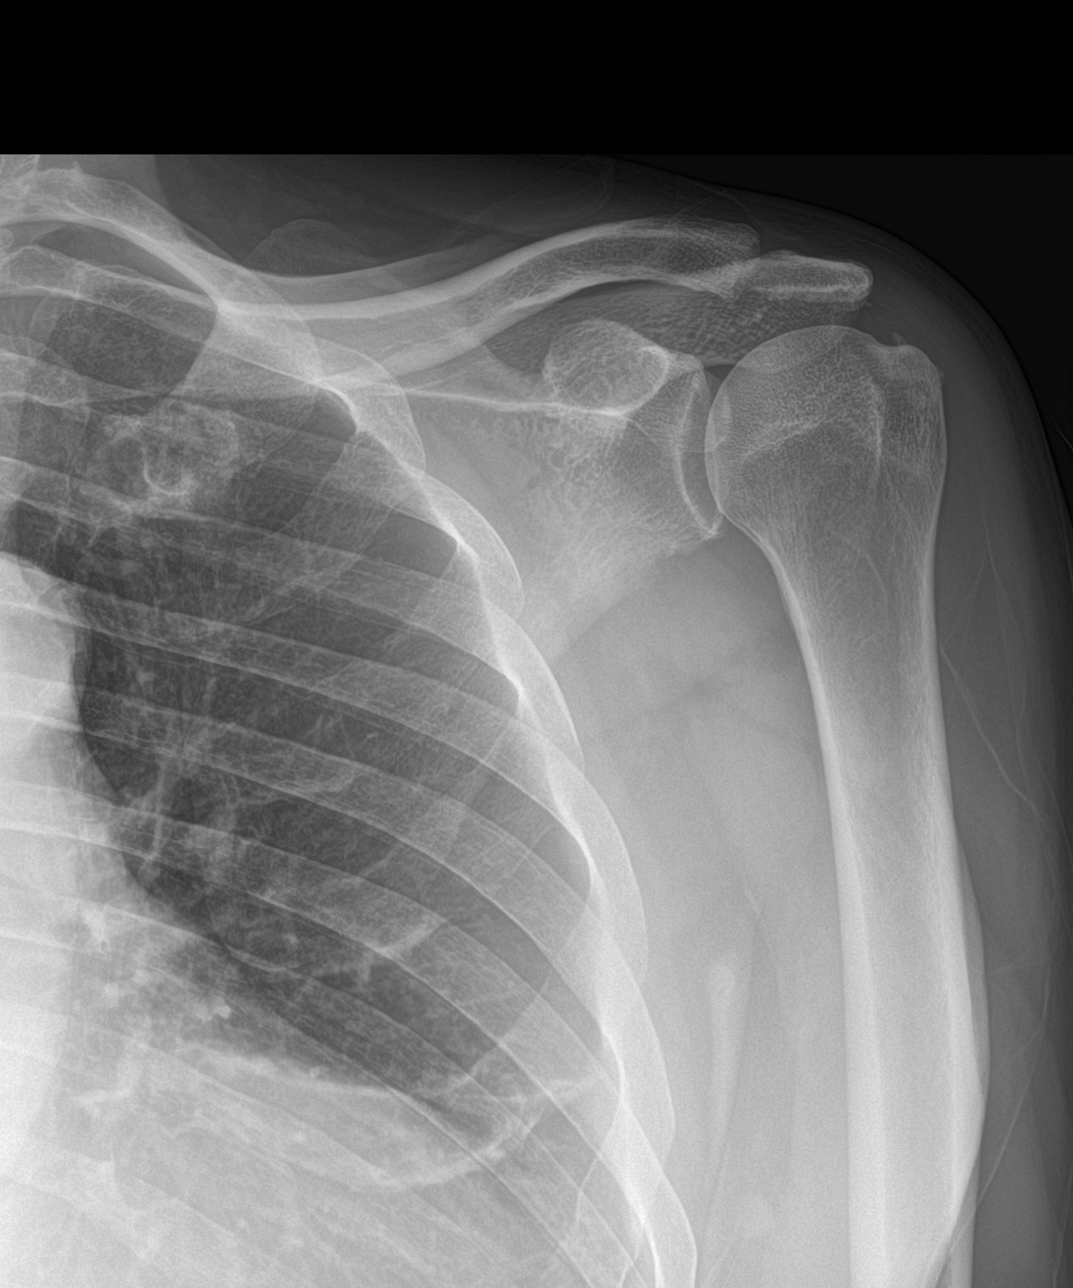

[shoulder y-view]
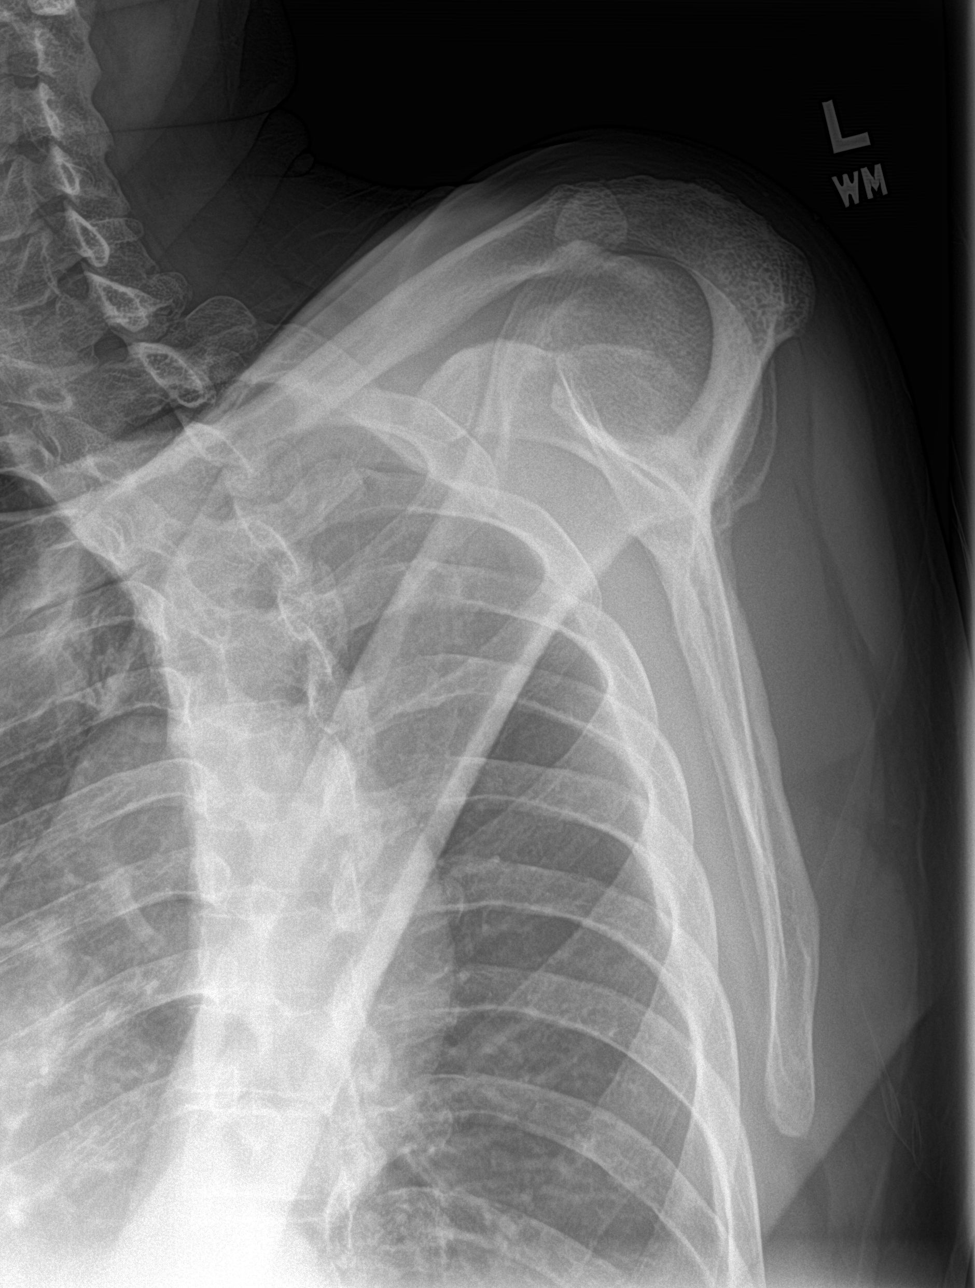

[shoulder axial]
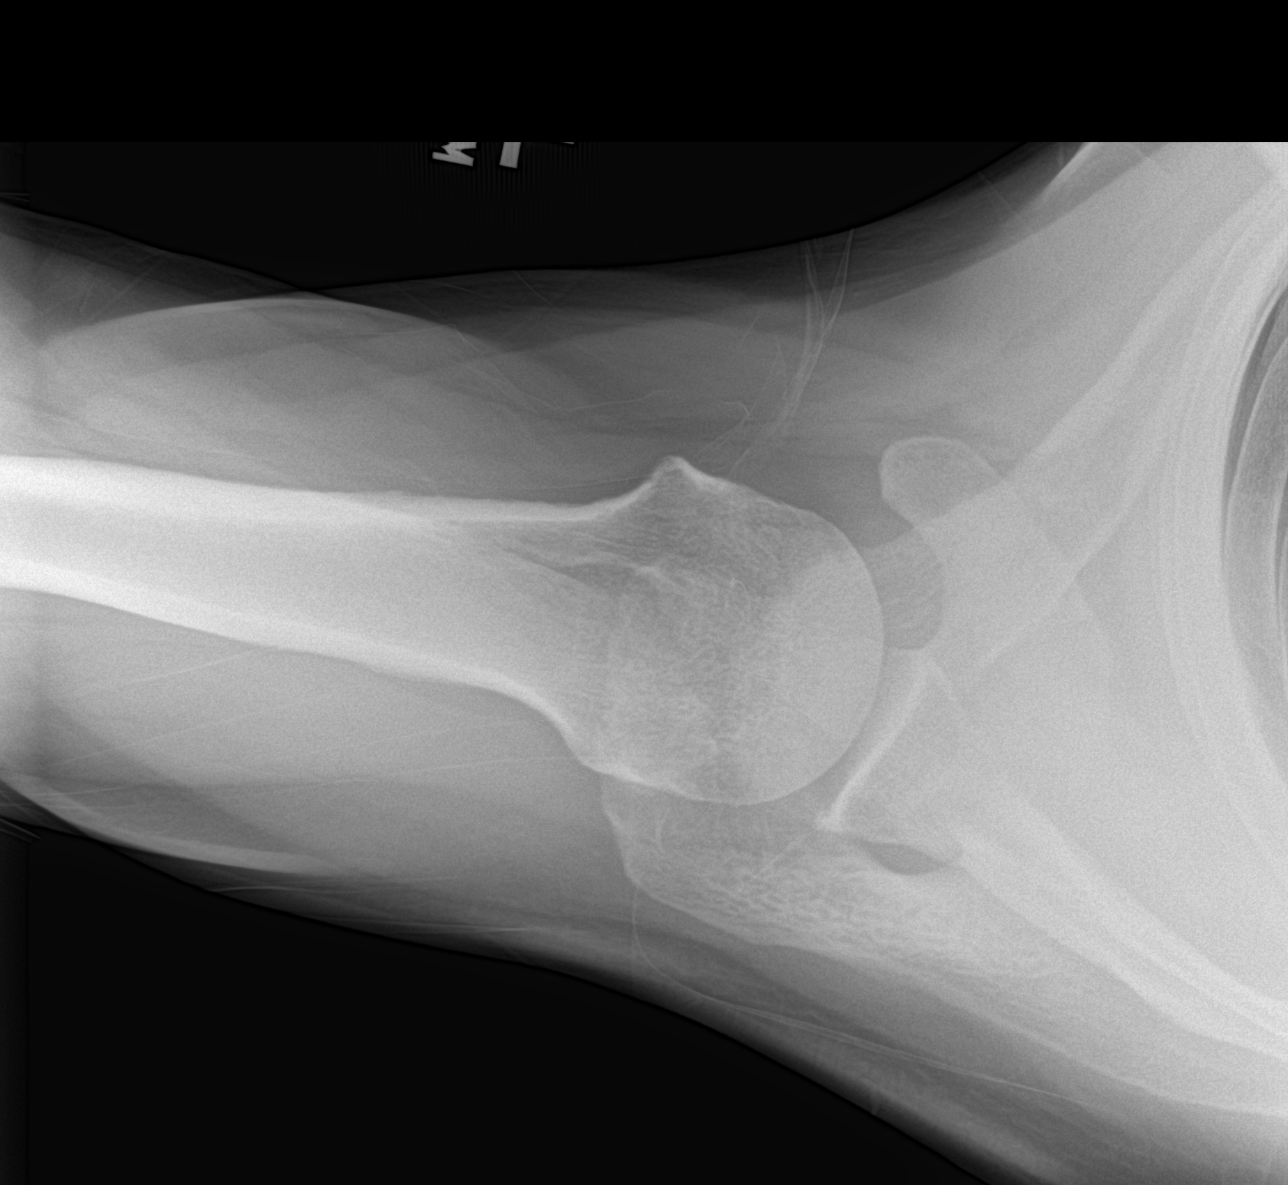

[3 of 3 positions shown; findings below may reference images not displayed]

FINDINGS: Frontal, Y scapular, and axillary images were obtained. No fracture
or dislocation. The joint spaces appear normal. No erosive change.
There is a small calcification just superior to the lateral humeral
head.
IMPRESSION: Small calcification just lateral to the left humeral head, likely
representing focal calcific tendinosis. No fracture or dislocation.
No appreciable joint space narrowing or erosion.

## 2016-07-15 ENCOUNTER — Telehealth: Payer: Self-pay

## 2016-07-15 DIAGNOSIS — I739 Peripheral vascular disease, unspecified: Secondary | ICD-10-CM | POA: Insufficient documentation

## 2016-07-15 LAB — HM DIABETES FOOT EXAM: HM DIABETIC FOOT EXAM: NORMAL

## 2016-07-15 NOTE — Telephone Encounter (Signed)
Mr. Manfred had a once a year health screen done with a NP from [facility] and she wanted to let us know about some abnormal findings. On his PAD screening, his right foot was a 0.27 which is severe and his left foot was 0.49 which is significant. He states that sometimes his legs feel weak. They will send [place] a full detailed report.

## 2016-07-15 NOTE — Telephone Encounter (Signed)
Thanks.  Please let Philip Wood know I am sending him to see the vascular doctors due to these results

## 2016-08-08 ENCOUNTER — Encounter (INDEPENDENT_AMBULATORY_CARE_PROVIDER_SITE_OTHER): Payer: Self-pay | Admitting: Vascular Surgery

## 2016-08-08 ENCOUNTER — Ambulatory Visit (INDEPENDENT_AMBULATORY_CARE_PROVIDER_SITE_OTHER): Payer: Medicare Other | Admitting: Vascular Surgery

## 2016-08-08 VITALS — BP 136/82 | HR 75 | Resp 16 | Ht 69.5 in | Wt 196.0 lb

## 2016-08-08 DIAGNOSIS — E785 Hyperlipidemia, unspecified: Secondary | ICD-10-CM | POA: Diagnosis not present

## 2016-08-08 DIAGNOSIS — F17200 Nicotine dependence, unspecified, uncomplicated: Secondary | ICD-10-CM

## 2016-08-08 DIAGNOSIS — N183 Chronic kidney disease, stage 3 unspecified: Secondary | ICD-10-CM

## 2016-08-08 DIAGNOSIS — E0822 Diabetes mellitus due to underlying condition with diabetic chronic kidney disease: Secondary | ICD-10-CM | POA: Diagnosis not present

## 2016-08-08 DIAGNOSIS — I70213 Atherosclerosis of native arteries of extremities with intermittent claudication, bilateral legs: Secondary | ICD-10-CM | POA: Diagnosis not present

## 2016-08-08 DIAGNOSIS — F17210 Nicotine dependence, cigarettes, uncomplicated: Secondary | ICD-10-CM | POA: Diagnosis not present

## 2016-08-08 NOTE — Progress Notes (Signed)
Patient ID: Philip Wood, male   DOB: 1946/02/24, 71 y.o.   MRN: [id number]  Chief Complaint  Patient presents with  . New Patient (Initial Visit)    HPI Philip Wood is a 71 y.o. male.  I am asked to see the patient by Kathrine Haddock for evaluation of PAD.  The patient reports pain in his calves and lower legs.  This comes on with activity and is relieved by rest.  He says he can walk a couple hundred feet before the pain starts but it takes a while for it to get better.  Both legs are affected about the same.  There is no ulceration or infection.  This has been progressing over several months to years.  No clear inciting event or causative factors that started the pain.  Nothing has really helped the pain.  He has multiple atherosclerotic risk factors as listed below.     Past Medical History:  Diagnosis Date  . Cancer (HCC)    laryngeal  . Chronic kidney disease   . COPD (chronic obstructive pulmonary disease) ([place])   . Diabetes mellitus without complication ([place])   . Hyperlipidemia   . Hypertension   . Lumbago   . Tobacco abuse disorder     Past Surgical History:  Procedure Laterality Date  . CHOLECYSTECTOMY    . THROAT SURGERY      Family History  Problem Relation Age of Onset  . Heart disease Father   . Diabetes Sister   . Stroke Sister   . Cancer Brother     lung  . Cancer Brother     lung  . Cancer Brother     throat  . Diabetes Sister   . Cancer Brother     lung    Social History Social History  Substance Use Topics  . Smoking status: Current Every Day Smoker    Packs/day: 0.50    Types: Cigarettes  . Smokeless tobacco: Never Used  . Alcohol use No  No IVDU  No Known Allergies  Current Outpatient Prescriptions  Medication Sig Dispense Refill  . allopurinol (ZYLOPRIM) 100 MG tablet Take 1 tablet (100 mg total) by mouth daily. 90 tablet 1  . aspirin 81 MG tablet Take 81 mg by mouth daily.    Marland Kitchen atorvastatin (LIPITOR) 20 MG tablet Take 1  tablet (20 mg total) by mouth daily. 90 tablet 3  . metFORMIN (GLUCOPHAGE) 500 MG tablet Take 1 tablet (500 mg total) by mouth 2 (two) times daily with a meal. 180 tablet 1  . metoprolol tartrate (LOPRESSOR) 25 MG tablet Take 1 tablet (25 mg total) by mouth 2 (two) times daily. 180 tablet 1  . ramipril (ALTACE) 10 MG capsule Take 1 capsule (10 mg total) by mouth daily. 90 capsule 3  . ramipril (ALTACE) 5 MG capsule Take 1 capsule (5 mg total) by mouth daily. 90 capsule 1  . tiotropium (SPIRIVA) 18 MCG inhalation capsule Place 1 capsule (18 mcg total) into inhaler and inhale daily. 90 capsule 1   No current facility-administered medications for this visit.       REVIEW OF SYSTEMS (Negative unless checked)  Constitutional: '[]'$ Weight loss  '[]'$ Fever  '[]'$ Chills Cardiac: '[]'$ Chest pain   '[]'$ Chest pressure   '[]'$ Palpitations   '[]'$ Shortness of breath when laying flat   '[]'$ Shortness of breath at rest   '[]'$ Shortness of breath with exertion. Vascular:  '[x]'$ Pain in legs with walking   '[]'$ Pain in legs at rest   '[]'$   Pain in legs when laying flat   '[x]'$ Claudication   '[]'$ Pain in feet when walking  '[]'$ Pain in feet at rest  '[]'$ Pain in feet when laying flat   '[]'$ History of DVT   '[]'$ Phlebitis   '[]'$ Swelling in legs   '[]'$ Varicose veins   '[]'$ Non-healing ulcers Pulmonary:   '[]'$ Uses home oxygen   '[]'$ Productive cough   '[]'$ Hemoptysis   '[]'$ Wheeze  '[]'$ COPD   '[]'$ Asthma Neurologic:  '[]'$ Dizziness  '[]'$ Blackouts   '[]'$ Seizures   '[]'$ History of stroke   '[]'$ History of TIA  '[]'$ Aphasia   '[]'$ Temporary blindness   '[]'$ Dysphagia   '[]'$ Weakness or numbness in arms   '[]'$ Weakness or numbness in legs Musculoskeletal:  '[x]'$ Arthritis   '[]'$ Joint swelling   '[]'$ Joint pain   '[]'$ Low back pain Hematologic:  '[]'$ Easy bruising  '[]'$ Easy bleeding   '[]'$ Hypercoagulable state   '[]'$ Anemic  '[]'$ Hepatitis Gastrointestinal:  '[]'$ Blood in stool   '[]'$ Vomiting blood  '[]'$ Gastroesophageal reflux/heartburn   '[]'$ Abdominal pain Genitourinary:  '[]'$ Chronic kidney disease   '[]'$ Difficult urination  '[]'$ Frequent urination  '[]'$ Burning  with urination   '[]'$ Hematuria Skin:  '[]'$ Rashes   '[]'$ Ulcers   '[]'$ Wounds Psychological:  '[]'$ History of anxiety   '[]'$  History of major depression.    Physical Exam BP 136/82   Pulse 75   Resp 16   Ht 5' 9.5" (1.765 m)   Wt 88.9 kg (196 lb)   BMI 28.53 kg/m  Gen:  WD/WN, NAD Head: [place]/AT, No temporalis wasting. Ear/Nose/Throat: Hearing grossly intact, nares w/o erythema or drainage, oropharynx w/o Erythema/Exudate Eyes: Conjunctiva clear, sclera non-icteric  Neck: trachea midline.  No JVD.  Pulmonary:  Good air movement, clear to auscultation bilaterally.  Cardiac: RRR, normal S1, S2, no Murmurs, rubs or gallops. Vascular:  Vessel Right Left  Radial Palpable Palpable  Ulnar Palpable Palpable  Brachial Palpable Palpable  Carotid Palpable, without bruit Palpable, without bruit  Aorta Not palpable N/A  Femoral Palpable Palpable  Popliteal Not Palpable Not Palpable  PT Not Palpable Trace Palpable  DP 1+ Palpable 1+ Palpable   Gastrointestinal: soft, non-tender/non-distended.  Musculoskeletal: M/S 5/5 throughout.  Extremities without ischemic changes.  No deformity or atrophy.  Neurologic: Sensation grossly intact in extremities.  Symmetrical.  Speech is fluent. Motor exam as listed above. Psychiatric: Judgment intact, Mood & affect appropriate for pt's clinical situation. Dermatologic: No rashes or ulcers noted.  No cellulitis or open wounds.    Radiology Dg Shoulder Left  Result Date: 07/14/2016 CLINICAL DATA:  Pain for 1 month EXAM: LEFT SHOULDER - 2+ VIEW COMPARISON:  None. FINDINGS: Frontal, Y scapular, and axillary images were obtained. No fracture or dislocation. The joint spaces appear normal. No erosive change. There is a small calcification just superior to the lateral humeral head. IMPRESSION: Small calcification just lateral to the left humeral head, likely representing focal calcific tendinosis. No fracture or dislocation. No appreciable joint space narrowing or erosion.  Electronically Signed   By: Lowella Grip III M.D.   On: 07/14/2016 11:17    Labs Recent Results (from the past 2160 hour(s))  Bayer DCA Hb A1c Waived     Status: None   Collection Time: 07/11/16 10:17 AM  Result Value Ref Range   Bayer DCA Hb A1c Waived 6.0 <7.0 %    Comment:                                       Diabetic Adult            <  7.0                                       Healthy Adult        4.3 - 5.7                                                           (DCCT/NGSP) American Diabetes Association's Summary of Glycemic Recommendations for Adults with Diabetes: Hemoglobin A1c <7.0%. More stringent glycemic goals (A1c <6.0%) may further reduce complications at the cost of increased risk of hypoglycemia.   Comprehensive metabolic panel     Status: Abnormal   Collection Time: 07/11/16 10:17 AM  Result Value Ref Range   Glucose 100 (H) 65 - 99 mg/dL   BUN 16 8 - 27 mg/dL   Creatinine, Ser 1.18 0.76 - 1.27 mg/dL   GFR calc non Af Amer 62 >59 mL/min/1.73   GFR calc Af Amer 72 >59 mL/min/1.73   BUN/Creatinine Ratio 14 10 - 24   Sodium 141 134 - 144 mmol/L   Potassium 4.9 3.5 - 5.2 mmol/L   Chloride 97 96 - 106 mmol/L   CO2 26 18 - 29 mmol/L   Calcium 9.5 8.6 - 10.2 mg/dL   Total Protein 6.4 6.0 - 8.5 g/dL   Albumin 3.9 3.5 - 4.8 g/dL   Globulin, Total 2.5 1.5 - 4.5 g/dL   Albumin/Globulin Ratio 1.6 1.2 - 2.2   Bilirubin Total 0.4 0.0 - 1.2 mg/dL   Alkaline Phosphatase 95 39 - 117 IU/L   AST 8 0 - 40 IU/L   ALT 7 0 - 44 IU/L  Lipid Panel w/o Chol/HDL Ratio     Status: Abnormal   Collection Time: 07/11/16 10:17 AM  Result Value Ref Range   Cholesterol, Total 153 100 - 199 mg/dL   Triglycerides 192 (H) 0 - 149 mg/dL   HDL 32 (L) >39 mg/dL   VLDL Cholesterol Cal 38 5 - 40 mg/dL   LDL Calculated 83 0 - 99 mg/dL    Assessment/Plan:  Tobacco use disorder We had a discussion for approximately 4 minutes regarding the absolute need for smoking cessation due to the  deleterious nature of tobacco on the vascular system. We discussed the tobacco use would diminish patency of any intervention, and likely significantly worsen progressio of disease. We discussed multiple agents for quitting including replacement therapy or medications to reduce cravings such as Chantix. The patient voices their understanding of the importance of smoking cessation.   Hyperlipidemia lipid control important in reducing the progression of atherosclerotic disease. Continue statin therapy   Diabetes mellitus with chronic kidney disease blood pressure control important in reducing the progression of atherosclerotic disease. On appropriate oral medications.   Atherosclerosis of native arteries of extremity with intermittent claudication (HCC) Recommend: We discussed the natural history and pathophysiology of PAD.  We discussed the absolute need for smoking cessation, and continued use of antiplatelet therapy and aspirin.  We have recommended a regular exercise regimen, but the patient does not think he can comply with this due to the pain. Patient should undergo arterial duplex of the lower extremity ASAP because there has been a significant deterioration in the patient's lower extremity symptoms.  The patient states they are having increased pain and a marked decrease in the distance that they can walk.  The risks and benefits as well as the alternatives were discussed in detail with the patient.  All questions were answered.  Patient agrees to proceed and understands this could be a prelude to angiography and intervention.  The patient will follow up with me in the office to review the studies.       Leotis Pain 08/11/2016, 9:55 AM   This note was created with Dragon medical transcription system.  Any errors from dictation are unintentional.

## 2016-08-11 DIAGNOSIS — I70219 Atherosclerosis of native arteries of extremities with intermittent claudication, unspecified extremity: Secondary | ICD-10-CM | POA: Insufficient documentation

## 2016-08-11 NOTE — Assessment & Plan Note (Signed)
blood pressure control important in reducing the progression of atherosclerotic disease. On appropriate oral medications.  

## 2016-08-11 NOTE — Assessment & Plan Note (Signed)
lipid control important in reducing the progression of atherosclerotic disease. Continue statin therapy  

## 2016-08-11 NOTE — Assessment & Plan Note (Signed)
We had a discussion for approximately 4 minutes regarding the absolute need for smoking cessation due to the deleterious nature of tobacco on the vascular system. We discussed the tobacco use would diminish patency of any intervention, and likely significantly worsen progressio of disease. We discussed multiple agents for quitting including replacement therapy or medications to reduce cravings such as Chantix. The patient voices their understanding of the importance of smoking cessation.

## 2016-08-11 NOTE — Patient Instructions (Signed)
Peripheral Vascular Disease Peripheral vascular disease (PVD) is a disease of the blood vessels that are not part of your heart and brain. A simple term for PVD is poor circulation. In most cases, PVD narrows the blood vessels that carry blood from your heart to the rest of your body. This can result in a decreased supply of blood to your arms, legs, and internal organs, like your stomach or kidneys. However, it most often affects a person's lower legs and feet. There are two types of PVD.  Organic PVD. This is the more common type. It is caused by damage to the structure of blood vessels.  Functional PVD. This is caused by conditions that make blood vessels contract and tighten (spasm).  Without treatment, PVD tends to get worse over time. PVD can also lead to acute ischemic limb. This is when an arm or limb suddenly has trouble getting enough blood. This is a medical emergency. What are the causes? Each type of PVD has many different causes. The most common cause of PVD is buildup of a fatty material (plaque) inside of your arteries (atherosclerosis). Small amounts of plaque can break off from the walls of the blood vessels and become lodged in a smaller artery. This blocks blood flow and can cause acute ischemic limb. Other common causes of PVD include:  Blood clots that form inside of blood vessels.  Injuries to blood vessels.  Diseases that cause inflammation of blood vessels or cause blood vessel spasms.  Health behaviors and health history that increase your risk of developing PVD.  What increases the risk? You may have a greater risk of PVD if you:  Have a family history of PVD.  Have certain medical conditions, including: ? High cholesterol. ? Diabetes. ? High blood pressure (hypertension). ? Coronary heart disease. ? Past problems with blood clots. ? Past injury, such as burns or a broken bone. These may have damaged blood vessels in your limbs. ? Buerger disease. This is  caused by inflamed blood vessels in your hands and feet. ? Some forms of arthritis. ? Rare birth defects that affect the arteries in your legs.  Use tobacco.  Do not get enough exercise.  Are obese.  Are age 50 or older.  What are the signs or symptoms? PVD may cause many different symptoms. Your symptoms depend on what part of your body is not getting enough blood. Some common signs and symptoms include:  Cramps in your lower legs. This may be a symptom of poor leg circulation (claudication).  Pain and weakness in your legs while you are physically active that goes away when you rest (intermittent claudication).  Leg pain when at rest.  Leg numbness, tingling, or weakness.  Coldness in a leg or foot, especially when compared with the other leg.  Skin or hair changes. These can include: ? Hair loss. ? Shiny skin. ? Pale or bluish skin. ? Thick toenails.  Inability to get or maintain an erection (erectile dysfunction).  People with PVD are more prone to developing ulcers and sores on their toes, feet, or legs. These may take longer than normal to heal. How is this diagnosed? Your health care provider may diagnose PVD from your signs and symptoms. The health care provider will also do a physical exam. You may have tests to find out what is causing your PVD and determine its severity. Tests may include:  Blood pressure recordings from your arms and legs and measurements of the strength of your pulses (  pulse volume recordings).  Imaging studies using sound waves to take pictures of the blood flow through your blood vessels (Doppler ultrasound).  Injecting a dye into your blood vessels before having imaging studies using: ? X-rays (angiogram or arteriogram). ? Computer-generated X-rays (CT angiogram). ? A powerful electromagnetic field and a computer (magnetic resonance angiogram or MRA).  How is this treated? Treatment for PVD depends on the cause of your condition and the  severity of your symptoms. It also depends on your age. Underlying causes need to be treated and controlled. These include long-lasting (chronic) conditions, such as diabetes, high cholesterol, and high blood pressure. You may need to first try making lifestyle changes and taking medicines. Surgery may be needed if these do not work. Lifestyle changes may include:  Quitting smoking.  Exercising regularly.  Following a low-fat, low-cholesterol diet.  Medicines may include:  Blood thinners to prevent blood clots.  Medicines to improve blood flow.  Medicines to improve your blood cholesterol levels.  Surgical procedures may include:  A procedure that uses an inflated balloon to open a blocked artery and improve blood flow (angioplasty).  A procedure to put in a tube (stent) to keep a blocked artery open (stent implant).  Surgery to reroute blood flow around a blocked artery (peripheral bypass surgery).  Surgery to remove dead tissue from an infected wound on the affected limb.  Amputation. This is surgical removal of the affected limb. This may be necessary in cases of acute ischemic limb that are not improved through medical or surgical treatments.  Follow these instructions at home:  Take medicines only as directed by your health care provider.  Do not use any tobacco products, including cigarettes, chewing tobacco, or electronic cigarettes. If you need help quitting, ask your health care provider.  Lose weight if you are overweight, and maintain a healthy weight as directed by your health care provider.  Eat a diet that is low in fat and cholesterol. If you need help, ask your health care provider.  Exercise regularly. Ask your health care provider to suggest some good activities for you.  Use compression stockings or other mechanical devices as directed by your health care provider.  Take good care of your feet. ? Wear comfortable shoes that fit well. ? Check your feet  often for any cuts or sores. Contact a health care provider if:  You have cramps in your legs while walking.  You have leg pain when you are at rest.  You have coldness in a leg or foot.  Your skin changes.  You have erectile dysfunction.  You have cuts or sores on your feet that are not healing. Get help right away if:  Your arm or leg turns cold and blue.  Your arms or legs become red, warm, swollen, painful, or numb.  You have chest pain or trouble breathing.  You suddenly have weakness in your face, arm, or leg.  You become very confused or lose the ability to speak.  You suddenly have a very bad headache or lose your vision. This information is not intended to replace advice given to you by your health care provider. Make sure you discuss any questions you have with your health care provider. Document Released: 05/15/2004 Document Revised: 09/13/2015 Document Reviewed: 09/15/2013 Elsevier Interactive Patient Education  2017 [employer]  

## 2016-08-11 NOTE — Assessment & Plan Note (Signed)
Recommend: We discussed the natural history and pathophysiology of PAD.  We discussed the absolute need for smoking cessation, and continued use of antiplatelet therapy and aspirin.  We have recommended a regular exercise regimen, but the patient does not think he can comply with this due to the pain. Patient should undergo arterial duplex of the lower extremity ASAP because there has been a significant deterioration in the patient's lower extremity symptoms.  The patient states they are having increased pain and a marked decrease in the distance that they can walk.  The risks and benefits as well as the alternatives were discussed in detail with the patient.  All questions were answered.  Patient agrees to proceed and understands this could be a prelude to angiography and intervention.  The patient will follow up with me in the office to review the studies.

## 2016-08-12 ENCOUNTER — Ambulatory Visit (INDEPENDENT_AMBULATORY_CARE_PROVIDER_SITE_OTHER): Payer: Medicare Other

## 2016-08-12 ENCOUNTER — Encounter (INDEPENDENT_AMBULATORY_CARE_PROVIDER_SITE_OTHER): Payer: Self-pay | Admitting: Vascular Surgery

## 2016-08-12 ENCOUNTER — Ambulatory Visit (INDEPENDENT_AMBULATORY_CARE_PROVIDER_SITE_OTHER): Payer: Medicare Other | Admitting: Vascular Surgery

## 2016-08-12 VITALS — BP 143/87 | HR 81 | Resp 16

## 2016-08-12 DIAGNOSIS — F17200 Nicotine dependence, unspecified, uncomplicated: Secondary | ICD-10-CM

## 2016-08-12 DIAGNOSIS — E0822 Diabetes mellitus due to underlying condition with diabetic chronic kidney disease: Secondary | ICD-10-CM | POA: Diagnosis not present

## 2016-08-12 DIAGNOSIS — I739 Peripheral vascular disease, unspecified: Secondary | ICD-10-CM | POA: Diagnosis not present

## 2016-08-12 DIAGNOSIS — I70213 Atherosclerosis of native arteries of extremities with intermittent claudication, bilateral legs: Secondary | ICD-10-CM

## 2016-08-12 DIAGNOSIS — E785 Hyperlipidemia, unspecified: Secondary | ICD-10-CM | POA: Diagnosis not present

## 2016-08-12 NOTE — Progress Notes (Signed)
Subjective:    Patient ID: Philip Wood, male    DOB: 1946-03-24, 71 y.o.   MRN: [id number] Chief Complaint  Patient presents with  . Follow-up   Presents to review vascular studies. Patient last seen on 08/08/16 for evaluation of bilateral lower extremity pain. He symptoms remain. States symptoms worse in right lower extremity then left. No ulceration or rest pain. Symptoms occur with activity. The patient underwent an ABI which showed Right ABI: 0.73 and Left 0.85 (no previous for comparison). A bilateral lower extremity arterial duplex is notable for possible narrowing in SFA bilaterally, triphasic to SFA then biphasic / monophasic distally.    Review of Systems  Constitutional: Negative.   HENT: Negative.   Eyes: Negative.   Respiratory: Negative.   Cardiovascular:       Lower Extremity Pain  Gastrointestinal: Negative.   Endocrine: Negative.   Genitourinary: Negative.   Musculoskeletal: Negative.   Skin: Negative.   Allergic/Immunologic: Negative.   Neurological: Negative.   Hematological: Negative.   Psychiatric/Behavioral: Negative.       Objective:   Physical Exam  Constitutional: He is oriented to person, place, and time. He appears well-developed and well-nourished.  HENT:  Head: Normocephalic and atraumatic.  Right Ear: External ear normal.  Left Ear: External ear normal.  Eyes: Conjunctivae are normal. Pupils are equal, round, and reactive to light.  Neck: Normal range of motion.  Cardiovascular: Normal rate, regular rhythm, normal heart sounds and intact distal pulses.   Pulses:      Radial pulses are 2+ on the right side, and 2+ on the left side.       Dorsalis pedis pulses are 1+ on the right side, and 1+ on the left side.       Posterior tibial pulses are 1+ on the right side, and 1+ on the left side.  Pulmonary/Chest: Effort normal.  Musculoskeletal: Normal range of motion. He exhibits no edema.  Neurological: He is alert and oriented to person,  place, and time.  Skin: Skin is warm and dry.  Psychiatric: He has a normal mood and affect. His behavior is normal. Judgment and thought content normal.  Vitals reviewed.  BP (!) 143/87   Pulse 81   Resp 16   Past Medical History:  Diagnosis Date  . Cancer (HCC)    laryngeal  . Chronic kidney disease   . COPD (chronic obstructive pulmonary disease) ([place])   . Diabetes mellitus without complication ([place])   . Hyperlipidemia   . Hypertension   . Lumbago   . Tobacco abuse disorder    Social History   Social History  . Marital status: Married    Spouse name: N/A  . Number of children: N/A  . Years of education: N/A   Occupational History  . Not on file.   Social History Main Topics  . Smoking status: Current Every Day Smoker    Packs/day: 0.50    Types: Cigarettes  . Smokeless tobacco: Never Used  . Alcohol use No  . Drug use: No  . Sexual activity: Not Currently   Other Topics Concern  . Not on file   Social History Narrative  . No narrative on file   Past Surgical History:  Procedure Laterality Date  . CHOLECYSTECTOMY    . THROAT SURGERY     Family History  Problem Relation Age of Onset  . Heart disease Father   . Diabetes Sister   . Stroke Sister   . Cancer Brother  lung  . Cancer Brother     lung  . Cancer Brother     throat  . Diabetes Sister   . Cancer Brother     lung   No Known Allergies     Assessment & Plan:  Presents to review vascular studies. Patient last seen on 08/08/16 for evaluation of bilateral lower extremity pain. He symptoms remain. States symptoms worse in right lower extremity then left. No ulceration or rest pain. Symptoms occur with activity. The patient underwent an ABI which showed Right ABI: 0.73 and Left 0.85 (no previous for comparison). A bilateral lower extremity arterial duplex is notable for possible narrowing in SFA bilaterally, triphasic to SFA then biphasic / monophasic distally.   1. PAD (peripheral artery  disease) ([place]) - New Patient with bilateral SFA disease. Recommend right lower extremity angiogram follow by left lower extremity angiogram in an attempt to revscularize the extremity and provide relief to the patient. The patient is not ready to move forward with the angiogram  If he changes his mind he will call the office.  Patient to follow up in six months. I have discussed with the patient at length the risk factors for and pathogenesis of atherosclerotic disease and encouraged a healthy diet, regular exercise regimen and blood pressure / glucose control.  The patient was encouraged to call the office in the interim if he experiences any claudication like symptoms, rest pain or ulcers to his feet / toes.  - VAS [place] LOWER EXTREMITY ARTERIAL DUPLEX; Future - VAS [place] ABI WITH/WO TBI; Future  2. 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 Tobacco use disorder - Stable I have discussed (approximately 5 minutes) with the patient the role of tobacco in the pathogenesis of atherosclerosis and its effect on the progression of the disease, impact on the durability of interventions and its limitations on the formation of collateral pathways. I have recommended absolute tobacco cessation. I have discussed various options available for assistance with tobacco cessation including over the counter methods (Nicotine gum, patch and lozenges). We also discussed prescription options (Chantix, Nicotine Inhaler / Nasal Spray). The patient is not interested in pursuing any prescription tobacco cessation options at this time. The patient voices their understanding.   4. Hyperlipidemia, unspecified hyperlipidemia type - Stable Encouraged good control as its slows the progression of atherosclerotic disease  Current Outpatient  Prescriptions on File Prior to Visit  Medication Sig Dispense Refill  . allopurinol (ZYLOPRIM) 100 MG tablet Take 1 tablet (100 mg total) by mouth daily. 90 tablet 1  . aspirin 81 MG tablet Take 81 mg by mouth daily.    Marland Kitchen atorvastatin (LIPITOR) 20 MG tablet Take 1 tablet (20 mg total) by mouth daily. 90 tablet 3  . metFORMIN (GLUCOPHAGE) 500 MG tablet Take 1 tablet (500 mg total) by mouth 2 (two) times daily with a meal. 180 tablet 1  . metoprolol tartrate (LOPRESSOR) 25 MG tablet Take 1 tablet (25 mg total) by mouth 2 (two) times daily. 180 tablet 1  . ramipril (ALTACE) 10 MG capsule Take 1 capsule (10 mg total) by mouth daily. 90 capsule 3  . ramipril (ALTACE) 5 MG capsule Take 1 capsule (5 mg total) by mouth daily. 90 capsule 1  . tiotropium (SPIRIVA) 18 MCG inhalation capsule Place 1 capsule (18 mcg total) into inhaler and  inhale daily. 90 capsule 1   No current facility-administered medications on file prior to visit.    There are no Patient Instructions on file for this visit. No Follow-up on file.  KIMBERLY A STEGMAYER, PA-C

## 2016-09-03 DIAGNOSIS — Z8521 Personal history of malignant neoplasm of larynx: Secondary | ICD-10-CM | POA: Diagnosis not present

## 2016-10-15 ENCOUNTER — Encounter (INDEPENDENT_AMBULATORY_CARE_PROVIDER_SITE_OTHER): Payer: Self-pay

## 2016-10-15 ENCOUNTER — Ambulatory Visit (INDEPENDENT_AMBULATORY_CARE_PROVIDER_SITE_OTHER): Payer: Self-pay | Admitting: Vascular Surgery

## 2017-01-12 ENCOUNTER — Ambulatory Visit (INDEPENDENT_AMBULATORY_CARE_PROVIDER_SITE_OTHER): Payer: Medicare Other | Admitting: Unknown Physician Specialty

## 2017-01-12 ENCOUNTER — Encounter: Payer: Self-pay | Admitting: Unknown Physician Specialty

## 2017-01-12 VITALS — BP 132/70 | HR 62 | Temp 98.1°F | Ht 68.3 in | Wt 186.1 lb

## 2017-01-12 DIAGNOSIS — E0822 Diabetes mellitus due to underlying condition with diabetic chronic kidney disease: Secondary | ICD-10-CM

## 2017-01-12 DIAGNOSIS — F17200 Nicotine dependence, unspecified, uncomplicated: Secondary | ICD-10-CM | POA: Diagnosis not present

## 2017-01-12 DIAGNOSIS — E785 Hyperlipidemia, unspecified: Secondary | ICD-10-CM

## 2017-01-12 DIAGNOSIS — M79672 Pain in left foot: Secondary | ICD-10-CM | POA: Diagnosis not present

## 2017-01-12 DIAGNOSIS — J449 Chronic obstructive pulmonary disease, unspecified: Secondary | ICD-10-CM

## 2017-01-12 DIAGNOSIS — Z23 Encounter for immunization: Secondary | ICD-10-CM | POA: Diagnosis not present

## 2017-01-12 DIAGNOSIS — I129 Hypertensive chronic kidney disease with stage 1 through stage 4 chronic kidney disease, or unspecified chronic kidney disease: Secondary | ICD-10-CM

## 2017-01-12 DIAGNOSIS — M79671 Pain in right foot: Secondary | ICD-10-CM | POA: Diagnosis not present

## 2017-01-12 LAB — BAYER DCA HB A1C WAIVED: HB A1C (BAYER DCA - WAIVED): 5.9 % (ref <7.0)

## 2017-01-12 MED ORDER — METFORMIN HCL 500 MG PO TABS: 500.0000 mg | ORAL_TABLET | Freq: Two times a day (BID) | ORAL | 1 refills | Status: DC

## 2017-01-12 MED ORDER — METOPROLOL TARTRATE 25 MG PO TABS: 25.0000 mg | ORAL_TABLET | Freq: Two times a day (BID) | ORAL | 1 refills | Status: DC

## 2017-01-12 MED ORDER — ALLOPURINOL 100 MG PO TABS: 100.0000 mg | ORAL_TABLET | Freq: Every day | ORAL | 1 refills | Status: DC

## 2017-01-12 MED ORDER — TIOTROPIUM BROMIDE MONOHYDRATE 18 MCG IN CAPS: 18.0000 ug | ORAL_CAPSULE | Freq: Every day | RESPIRATORY_TRACT | 1 refills | Status: DC

## 2017-01-12 NOTE — Assessment & Plan Note (Signed)
Stable, continue present medications.   

## 2017-01-12 NOTE — Assessment & Plan Note (Signed)
I have recommended absolute tobacco cessation. I have discussed various options available for assistance with tobacco cessation including over the counter methods (Nicotine gum, patch and lozenges). We also discussed prescription options (Chantix, Nicotine Inhaler / Nasal Spray). The patient is not interested in pursuing any prescription tobacco cessation options at this time.  

## 2017-01-12 NOTE — Assessment & Plan Note (Signed)
BP improved on recheck.  Stable, continue present medications.

## 2017-01-12 NOTE — Progress Notes (Signed)
BP 132/70   Pulse 62   Temp 98.1 F (36.7 C)   Ht 5' 8.3" (1.735 m)   Wt 186 lb 1.6 oz (84.4 kg)   SpO2 93%   BMI 28.05 kg/m    Subjective:    Patient ID: Philip Wood, male    DOB: 07/17/1945, 71 y.o.   MRN: [id number]  HPI: Philip Wood is a 71 y.o. male  Chief Complaint  Patient presents with  . Diabetes    pt states last eye exam in chart is correct and is scheduling one soon   . Hyperlipidemia  . Hypertension   Diabetes: Using medications without difficulties No hypoglycemic episodes No hyperglycemic episodes Feet problems: Bilateral toe pain while walking with shoes Blood Sugars averaging: Not checking eye exam within last year Last Hgb A1C: 6.0  Hypertension  Using medications without difficulty Average home BPs Not checking   Using medication without problems or lightheadedness No chest pain with exertion or shortness of breath No Edema  Elevated Cholesterol Using medications without problems No Muscle aches  Diet: Exercise:Feels he eats well.  Busy at work  COPD Pt is breathing well.  Has a new busy job at [employer] and is doing well    Relevant past medical, surgical, family and social history reviewed and updated as indicated. Interim medical history since our last visit reviewed. Allergies and medications reviewed and updated.  Review of Systems  Per HPI unless specifically indicated above     Objective:    BP 132/70   Pulse 62   Temp 98.1 F (36.7 C)   Ht 5' 8.3" (1.735 m)   Wt 186 lb 1.6 oz (84.4 kg)   SpO2 93%   BMI 28.05 kg/m   Wt Readings from Last 3 Encounters:  01/12/17 186 lb 1.6 oz (84.4 kg)  08/08/16 196 lb (88.9 kg)  07/11/16 194 lb (88 kg)    Physical Exam  Constitutional: He is oriented to person, place, and time. He appears well-developed and well-nourished. No distress.  HENT:  Head: Normocephalic and atraumatic.  Eyes: Conjunctivae and lids are normal. Right eye exhibits no discharge. Left eye exhibits no  discharge. No scleral icterus.  Neck: Normal range of motion. Neck supple. No JVD present. Carotid bruit is not present.  Cardiovascular: Normal rate, regular rhythm and normal heart sounds.   Pulmonary/Chest: Effort normal and breath sounds normal. No respiratory distress.  Abdominal: Normal appearance. There is no splenomegaly or hepatomegaly.  Musculoskeletal: Normal range of motion.  Neurological: He is alert and oriented to person, place, and time.  Skin: Skin is warm, dry and intact. No rash noted. No pallor.  Psychiatric: He has a normal mood and affect. His behavior is normal. Judgment and thought content normal.   See foot exam  Results for orders placed or performed in visit on 11/04/16  HM DIABETES FOOT EXAM  Result Value Ref Range   HM Diabetic Foot Exam bilateral- normal       Assessment & Plan:   Problem List Items Addressed This Visit      Unprioritized   COPD (chronic obstructive pulmonary disease) ([place])    Stable, continue present medications.        Relevant Medications   tiotropium (SPIRIVA) 18 MCG inhalation capsule   Diabetes mellitus with chronic kidney disease (HCC)    Hgb A1C is 5.9.  Continue current medications      Relevant Medications   metFORMIN (GLUCOPHAGE) 500 MG tablet   Other  Relevant Orders   Comprehensive metabolic panel   Lipid Panel w/o Chol/HDL Ratio   Bayer DCA Hb A1c Waived   Hyperlipidemia   Relevant Medications   metoprolol tartrate (LOPRESSOR) 25 MG tablet   Hypertensive CKD (chronic kidney disease)    BP improved on recheck.  Stable, continue present medications.        Relevant Orders   Bayer DCA Hb A1c Waived   Tobacco use disorder     I have recommended absolute tobacco cessation. I have discussed various options available for assistance with tobacco cessation including over the counter methods (Nicotine gum, patch and lozenges). We also discussed prescription options (Chantix, Nicotine Inhaler / Nasal Spray). The  patient is not interested in pursuing any prescription tobacco cessation options at this time.        Other Visit Diagnoses    Need for influenza vaccination    -  Primary   Relevant Orders   Flu vaccine HIGH DOSE PF (Completed)   Pain in both feet       Bilateral toes.  No sign of infection.  Shoes seem to be too tight.  Increase size and see if problem resolves.  May need diabetic shoes       Follow up plan: Return in about 6 months (around 07/12/2017) for physical.

## 2017-01-12 NOTE — Assessment & Plan Note (Signed)
Hgb A1C is 5.9.  Continue current medications

## 2017-01-12 NOTE — Patient Instructions (Addendum)

## 2017-01-13 ENCOUNTER — Encounter: Payer: Self-pay | Admitting: Unknown Physician Specialty

## 2017-01-13 LAB — LIPID PANEL W/O CHOL/HDL RATIO
Cholesterol, Total: 171 mg/dL (ref 100–199)
HDL: 46 mg/dL (ref >39)
LDL CALC: 104 mg/dL — AB (ref 0–99)
Triglycerides: 103 mg/dL (ref 0–149)
VLDL CHOLESTEROL CAL: 21 mg/dL (ref 5–40)

## 2017-01-13 LAB — COMPREHENSIVE METABOLIC PANEL
A/G RATIO: 2.2 (ref 1.2–2.2)
ALK PHOS: 92 IU/L (ref 39–117)
ALT: 9 IU/L (ref 0–44)
AST: 13 IU/L (ref 0–40)
Albumin: 4 g/dL (ref 3.5–4.8)
BUN/Creatinine Ratio: 13 (ref 10–24)
BUN: 15 mg/dL (ref 8–27)
Bilirubin Total: 0.4 mg/dL (ref 0.0–1.2)
CO2: 25 mmol/L (ref 20–29)
Calcium: 9.1 mg/dL (ref 8.6–10.2)
Chloride: 101 mmol/L (ref 96–106)
Creatinine, Ser: 1.18 mg/dL (ref 0.76–1.27)
GFR calc Af Amer: 71 mL/min/{1.73_m2} (ref >59)
GFR calc non Af Amer: 62 mL/min/{1.73_m2} (ref >59)
GLOBULIN, TOTAL: 1.8 g/dL (ref 1.5–4.5)
Glucose: 96 mg/dL (ref 65–99)
POTASSIUM: 5.3 mmol/L — AB (ref 3.5–5.2)
SODIUM: 140 mmol/L (ref 134–144)
Total Protein: 5.8 g/dL — ABNORMAL LOW (ref 6.0–8.5)

## 2017-02-13 ENCOUNTER — Encounter (INDEPENDENT_AMBULATORY_CARE_PROVIDER_SITE_OTHER): Payer: Self-pay

## 2017-02-13 ENCOUNTER — Ambulatory Visit (INDEPENDENT_AMBULATORY_CARE_PROVIDER_SITE_OTHER): Payer: Self-pay | Admitting: Vascular Surgery

## 2017-05-29 ENCOUNTER — Encounter: Payer: Self-pay | Admitting: Unknown Physician Specialty

## 2017-06-21 ENCOUNTER — Encounter: Payer: Self-pay | Admitting: Family Medicine

## 2017-06-21 LAB — FECAL OCCULT BLOOD, GUAIAC: Fecal Occult Blood: NEGATIVE

## 2017-07-06 IMAGING — CT NM PET TUM IMG RESTAG (PS) SKULL BASE T - THIGH
10 series · 22 of 25 positions shown · non-contrast
Comparison: [DATE] chest CT.  Prior PET of [DATE]

CLINICAL DATA: Initial treatment strategy for solitary pulmonary
nodule. History of non-small-cell lung cancer. Restaging.

EXAM:
NUCLEAR MEDICINE PET SKULL BASE TO THIGH
TECHNIQUE: 9.8 mCi F-18 FDG was injected intravenously. Full-ring PET imaging
was performed from the skull base to thigh after the radiotracer. CT
data was obtained and used for attenuation correction and anatomic
localization.
Fasting blood glucose: 108 mg/dl

[Series 3: ct wb 5.0 b30f · axial · 5.0mm · 0.98mm/px · z∈[-1054,-70]mm · 3 of 329 slices shown]
[im 1/329]
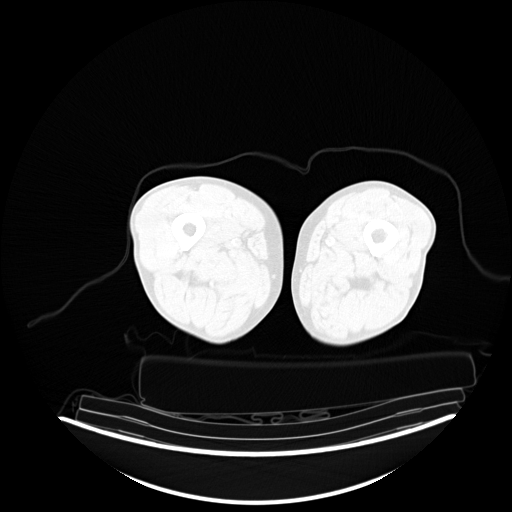
[im 165/329]
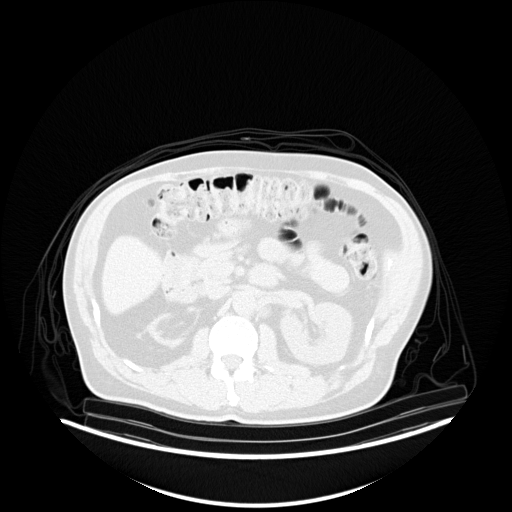
[im 329/329  brain]
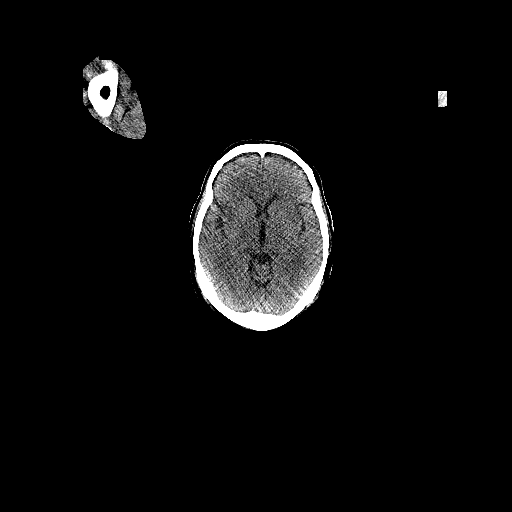

[Series 4: pet wb (ac) · axial · 5.0mm · 2.91mm/px · z∈[-1054,-70]mm · 2 of 329 slices shown]
[im 1/329]
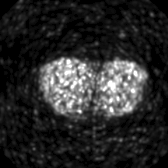
[im 329/329]
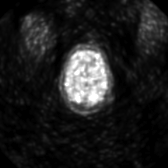

[Series 5: pet wb uncorrected (nac) · axial · 5.0mm · 4.07mm/px · z∈[-1054,-70]mm · 3 of 329 slices shown]
[im 1/329]
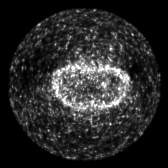
[im 165/329]
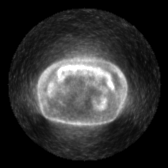
[im 329/329]
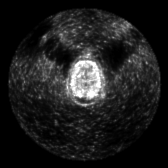

[Series 603: pet axial fused · 3 of 312 slices shown]
[im 1/312]
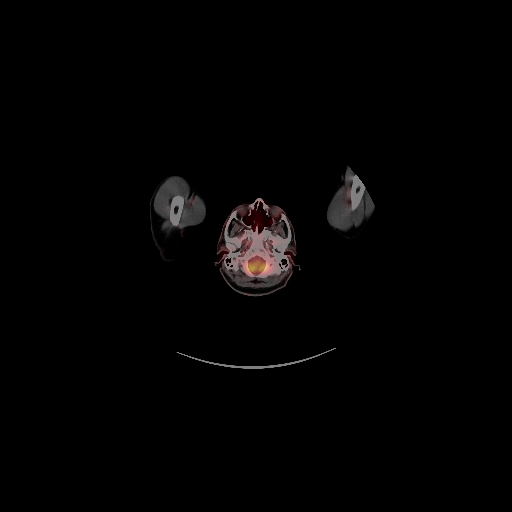
[im 104/312]
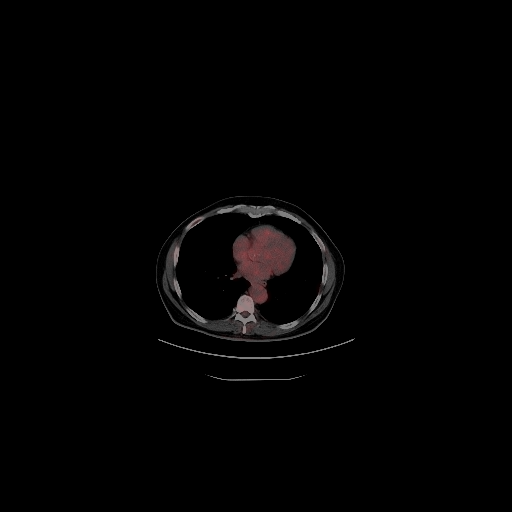
[im 208/312]
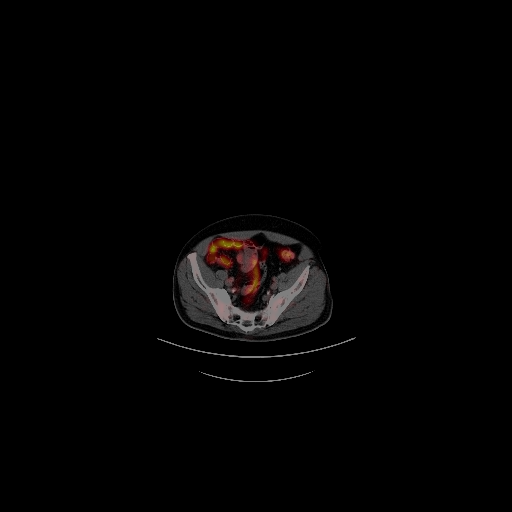

[Series 604: pet coronal fused · 1 of 120 slices shown]
[im 1/120]
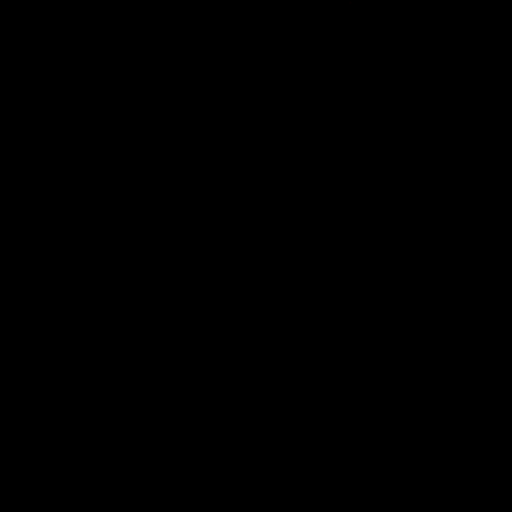

[Series 605: pet sagittal fused · 2 of 151 slices shown]
[im 1/151]
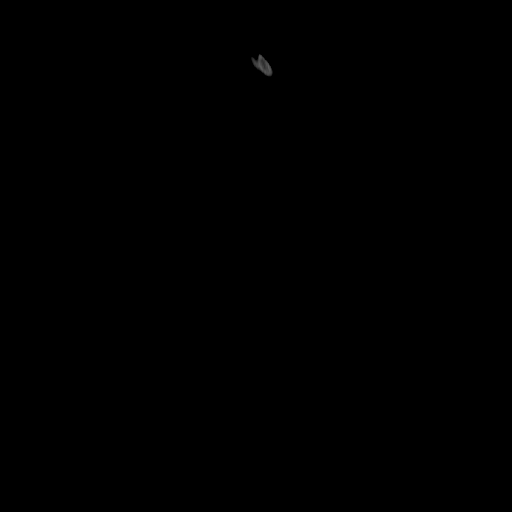
[im 151/151]
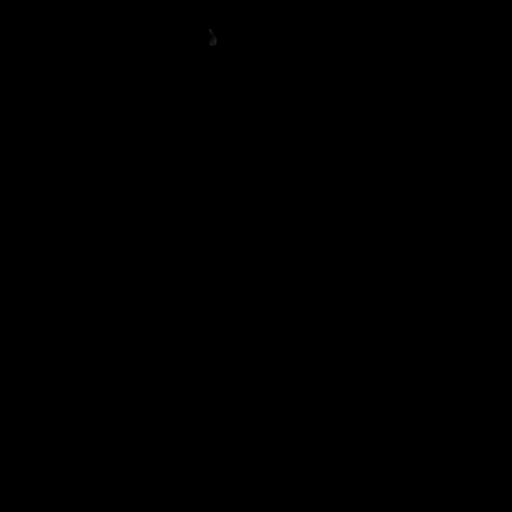

[Series 606: pet axial · 4 of 327 slices shown]
[im 1/327]
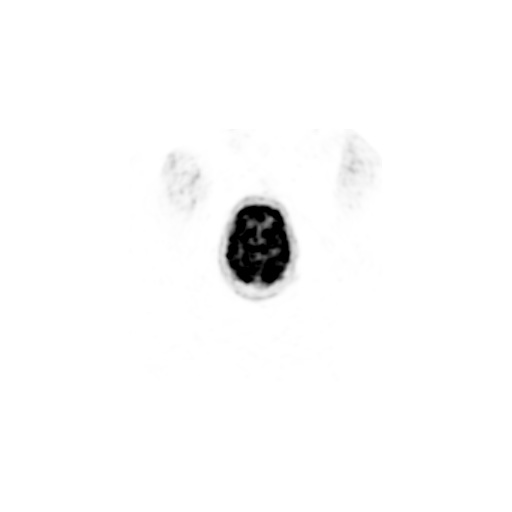
[im 109/327]
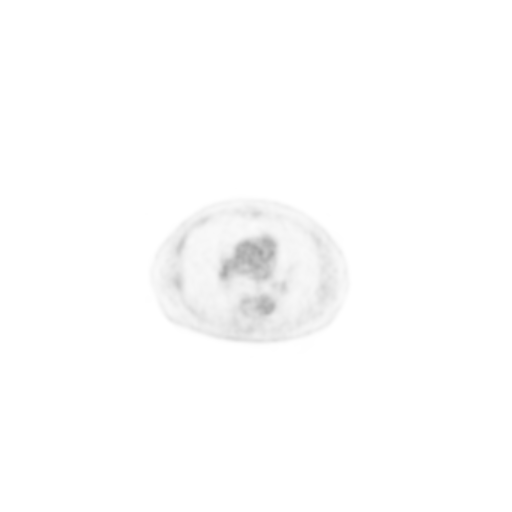
[im 218/327]
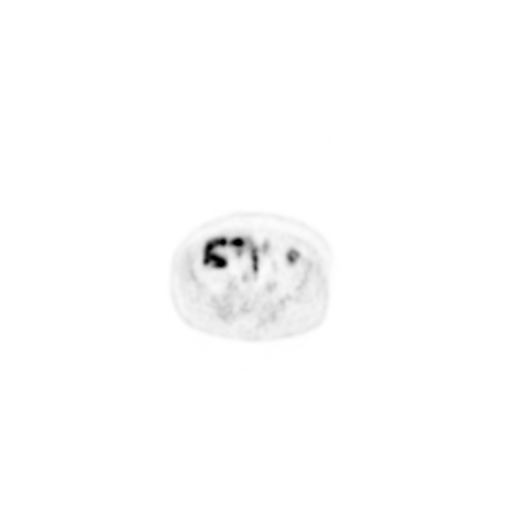
[im 327/327]
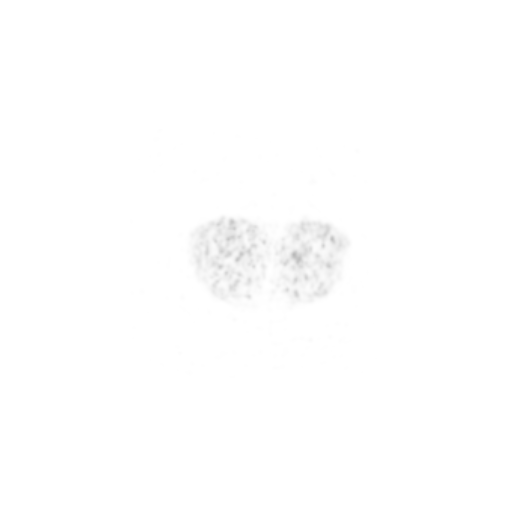

[Series 607: pet coronal · 1 of 144 slices shown]
[im 144/144]
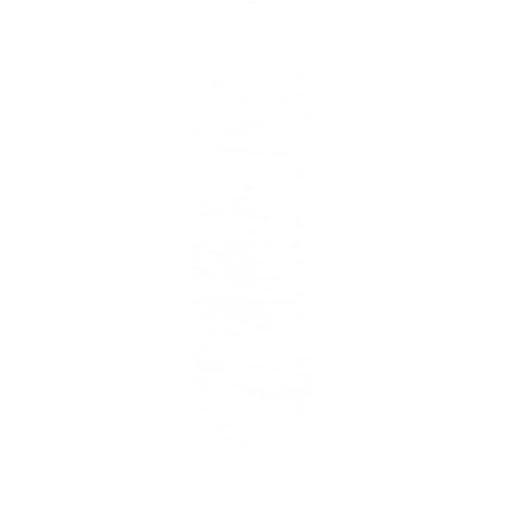

[Series 608: pet sagittal · 2 of 158 slices shown]
[im 1/158]
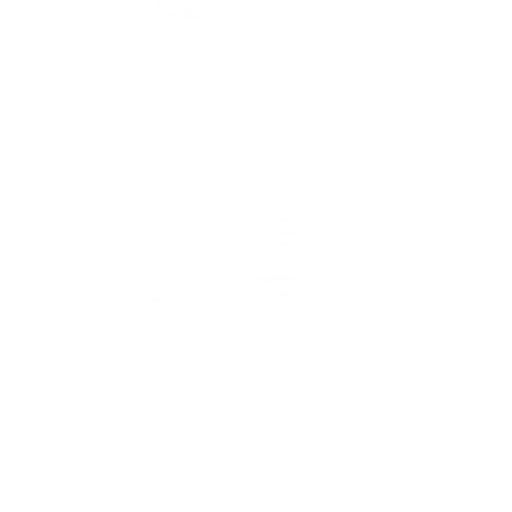
[im 158/158]
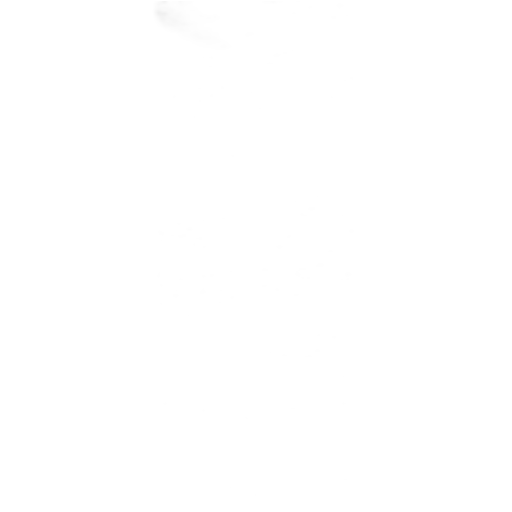

[Series 1153: results mm oncology reading · 3.0mm · 1.10mm/px · 1 of 5 slices shown]
[im 1/5]
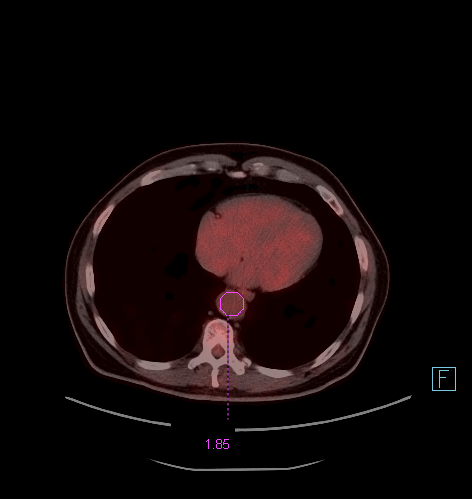

[22 of 25 positions shown; findings below may reference images not displayed]

FINDINGS: Mediastinal blood pool activity: SUV max

Liver activity: SUV max Not applicable.

NECK: No areas of abnormal hypermetabolism.

Incidental CT findings: Cerebral and cerebellar atrophy. Mucosal
thickening of the right maxillary sinus.

CHEST: A new vague right upper lobe peribronchovascular nodularity
and low-level hypermetabolism laterally, including at 6 mm and a
S.U.V. max of 1.4 on 77/3.

Anterior right upper lobe partially cavitary 2.5 cm nodule measures
a S.U.V. max of 11.2. On the prior PET, 1.0 cm and a S.U.V. max of
4.5. Similar in size on the [DATE] diagnostic CT.

At least partially endobronchial, central right upper lobe nodule
measures 1.5 by 1.8 cm and a S.U.V. max of 14.7 on 93/3. Compare 5
mm and a S.U.V. max of 1.6 on the prior PET. Grossly similar on
today's study to the diagnostic CT of [DATE].

The left lower lobe hypermetabolic pulmonary nodule has been
resected.

Incidental CT findings: Deferred to recent diagnostic CT. Aortic and
coronary artery atherosclerosis. Centrilobular emphysema.

ABDOMEN/PELVIS: No abdominopelvic parenchymal or nodal
hypermetabolism.

Incidental CT findings: Normal adrenal glands. Old granulomatous
disease in the liver. Cholecystectomy. Periampullary duodenal
diverticulum. Abdominal aortic atherosclerosis. Fluid density
retroperitoneal lesion in the left periaortic station of 1.6 cm is
similar on the prior PET, likely a lymphangioma. Right renal marked
atrophy. Scattered colonic diverticula. Moderate prostatomegaly.
Bilateral fat containing inguinal hernias.

SKELETON: A new focus of hypermetabolism about the left ischial
tuberosity, at the hamstring origin, measures a S.U.V. max of 5.6.
No soft tissue mass in this region. No abnormal marrow
hypermetabolism.

Incidental CT findings: none
IMPRESSION: 1. Since the prior PET of [DATE], the left lower lobe pulmonary
nodule has been resected. However, the right upper lobe
hypermetabolic pulmonary nodules have enlarged and increased in
hypermetabolism, most consistent with progressive metastatic disease
and/or new primaries.
2. No thoracic nodal hypermetabolism.
3. Incidental findings, including: Aortic atherosclerosis
([IB]-[IB]), coronary artery atherosclerosis and emphysema
([IB]-[IB]). Prostatomegaly. Right renal atrophy.
4. Hypermetabolism about the left hamstring origin is favored to be
related to tendinous strain.

## 2017-07-13 ENCOUNTER — Ambulatory Visit (INDEPENDENT_AMBULATORY_CARE_PROVIDER_SITE_OTHER): Payer: Medicare Other

## 2017-07-13 ENCOUNTER — Other Ambulatory Visit: Payer: Self-pay | Admitting: Unknown Physician Specialty

## 2017-07-13 VITALS — BP 154/82 | HR 60 | Temp 97.6°F | Resp 16 | Ht 68.0 in | Wt 171.3 lb

## 2017-07-13 DIAGNOSIS — Z0000 Encounter for general adult medical examination without abnormal findings: Secondary | ICD-10-CM

## 2017-07-13 NOTE — Progress Notes (Signed)
Subjective:   Philip Wood is a 72 y.o. male who presents for Medicare Annual/Subsequent preventive examination.  Review of Systems:   Cardiac Risk Factors include: hypertension;male gender;diabetes mellitus;advanced age (>61men, >13 women);dyslipidemia;smoking/ tobacco exposure     Objective:    Vitals: BP (!) 154/82 (BP Location: Left Arm, Patient Position: Sitting)   Pulse 60   Temp 97.6 F (36.4 C) (Temporal)   Resp 16   Ht 5\' 8"  (1.727 m)   Wt 171 lb 4.8 oz (77.7 kg)   BMI 26.05 kg/m   Body mass index is 26.05 kg/m.  Advanced Directives 07/13/2017 08/12/2016 08/08/2016 07/11/2016 03/28/2015  Does Patient Have a Medical Advance Directive? No No No No No  Would patient like information on creating a medical advance directive? No - Patient declined - - No - Patient declined -    Tobacco Social History   Tobacco Use  Smoking Status Current Every Day Smoker  . Packs/day: 0.50  . Types: Cigarettes  Smokeless Tobacco Never Used     Ready to quit: No Counseling given: Yes   Clinical Intake:  Pre-visit preparation completed: Yes  Pain : No/denies pain     Nutritional Status: BMI 25 -29 Overweight Nutritional Risks: None Diabetes: No  How often do you need to have someone help you when you read instructions, pamphlets, or other written materials from your doctor or pharmacy?: 1 - Never What is the last grade level you completed in school?: 11th grade  Interpreter Needed?: No  Information entered by :: Jacelynn Hayton,LPN   Past Medical History:  Diagnosis Date  . Cancer (HCC)    laryngeal  . Chronic kidney disease   . COPD (chronic obstructive pulmonary disease) ([place])   . Diabetes mellitus without complication ([place])   . Hyperlipidemia   . Hypertension   . Lumbago   . Tobacco abuse disorder    Past Surgical History:  Procedure Laterality Date  . CHOLECYSTECTOMY    . THROAT SURGERY     Family History  Problem Relation Age of Onset  . Heart  disease Father   . Diabetes Sister   . Stroke Sister   . Cancer Brother        lung  . Cancer Brother        lung  . Cancer Brother        throat  . Diabetes Sister   . Cancer Brother        lung   Social History   Socioeconomic History  . Marital status: Married    Spouse name: Not on file  . Number of children: Not on file  . Years of education: Not on file  . Highest education level: Not on file  Occupational History  . Not on file  Social Needs  . Financial resource strain: Not hard at all  . Food insecurity:    Worry: Never true    Inability: Never true  . Transportation needs:    Medical: No    Non-medical: No  Tobacco Use  . Smoking status: Current Every Day Smoker    Packs/day: 0.50    Types: Cigarettes  . Smokeless tobacco: Never Used  Substance and Sexual Activity  . Alcohol use: No    Alcohol/week: 0.0 oz  . Drug use: No  . Sexual activity: Not Currently  Lifestyle  . Physical activity:    Days per week: 0 days    Minutes per session: 0 min  . Stress: Not at all  Relationships  . Social connections:    Talks on phone: Twice a week    Gets together: Once a week    Attends religious service: More than 4 times per year    Active member of club or organization: No    Attends meetings of clubs or organizations: Never    Relationship status: Married  Other Topics Concern  . Not on file  Social History Narrative   Works part time at [employer]    Outpatient Encounter Medications as of 07/13/2017  Medication Sig  . allopurinol (ZYLOPRIM) 100 MG tablet Take 1 tablet (100 mg total) by mouth daily.  Marland Kitchen aspirin 81 MG tablet Take 81 mg by mouth daily.  Marland Kitchen atorvastatin (LIPITOR) 20 MG tablet Take 1 tablet (20 mg total) by mouth daily.  . metFORMIN (GLUCOPHAGE) 500 MG tablet Take 1 tablet (500 mg total) by mouth 2 (two) times daily with a meal.  . metoprolol tartrate (LOPRESSOR) 25 MG tablet Take 1 tablet (25 mg total) by mouth 2 (two) times daily.  . ramipril  (ALTACE) 10 MG capsule Take 1 capsule (10 mg total) by mouth daily.  Marland Kitchen tiotropium (SPIRIVA) 18 MCG inhalation capsule Place 1 capsule (18 mcg total) into inhaler and inhale daily.   No facility-administered encounter medications on file as of 07/13/2017.     Activities of Daily Living In your present state of health, do you have any difficulty performing the following activities: 07/13/2017  Hearing? N  Vision? N  Difficulty concentrating or making decisions? N  Walking or climbing stairs? N  Dressing or bathing? N  Doing errands, shopping? N  Preparing Food and eating ? N  Using the Toilet? N  In the past six months, have you accidently leaked urine? N  Do you have problems with loss of bowel control? N  Managing your Medications? N  Managing your Finances? N  Housekeeping or managing your Housekeeping? N  Some recent data might be hidden    Patient Care Team: Kathrine Haddock, NP as PCP - General (Nurse Practitioner) Beverly Gust, MD (Otolaryngology)   Assessment:   This is a routine wellness examination for Philip Wood.  Exercise Activities and Dietary recommendations Current Exercise Habits: The patient has a physically strenous job, but has no regular exercise apart from work., Exercise limited by: None identified  Goals    . Quit Smoking     Smoking cessation discussed       Fall Risk Fall Risk  07/13/2017 07/11/2016 03/28/2015  Falls in the past year? No No No   Is the patient's home free of loose throw rugs in walkways, pet beds, electrical cords, etc?   yes      Grab bars in the bathroom? yes      Handrails on the stairs?   no stairs       Adequate lighting?   yes  Timed Get Up and Go Performed: Completed in 8 seconds with no use of assistive devices, steady gait. No intervention needed at this time.   Depression Screen PHQ 2/9 Scores 07/13/2017 07/11/2016 03/28/2015  PHQ - 2 Score 0 0 0  PHQ- 9 Score - 0 -    Cognitive Function     6CIT Screen 07/13/2017    What Year? 0 points  What month? 0 points  What time? 0 points  Count back from 20 0 points  Months in reverse 0 points  Repeat phrase 0 points  Total Score 0    Immunization History  Administered  Date(s) Administered  . Influenza, High Dose Seasonal PF 01/11/2016, 01/12/2017  . Influenza,inj,Quad PF,6+ Mos 03/28/2015  . Pneumococcal Conjugate-13 08/12/2013  . Pneumococcal Polysaccharide-23 02/06/2012  . Td 11/24/2005  . Tdap 08/04/2012    Qualifies for Shingles Vaccine? Yes, discussed shingrix vaccine.   Screening Tests Health Maintenance  Topic Date Due  . COLONOSCOPY  01/29/2016  . OPHTHALMOLOGY EXAM  11/08/2016  . HEMOGLOBIN A1C  07/12/2017  . FOOT EXAM  07/15/2017  . TETANUS/TDAP  08/05/2022  . INFLUENZA VACCINE  Completed  . Hepatitis C Screening  Completed  . PNA vac Low Risk Adult  Completed   Has eye appt within the next month at [facility].   Cancer Screenings: Lung: Low Dose CT Chest recommended if Age 77-80 years, 30 pack-year currently smoking OR have quit w/in 15years. Patient does qualify. Message sent to Burgess Estelle, Rn- Oncology navigator  Colorectal:declined colonoscopy- performed hemoccult test at home- requested results.   Additional Screenings:  Hepatitis B/HIV/Syphillis:not indicated  Hepatitis C Screening: completed 03/28/2015    Plan:    I have personally reviewed and addressed the Medicare Annual Wellness questionnaire and have noted the following in the patient's chart:  A. Medical and social history B. Use of alcohol, tobacco or illicit drugs  C. Current medications and supplements D. Functional ability and status E.  Nutritional status F.  Physical activity G. Advance directives H. List of other physicians I.  Hospitalizations, surgeries, and ER visits in previous 12 months J.  [place] such as hearing and vision if needed, cognitive and depression L. Referrals and appointments   In addition, I have reviewed  and discussed with patient certain preventive protocols, quality metrics, and best practice recommendations. A written personalized care plan for preventive services as well as general preventive health recommendations were provided to patient.   Signed,  Tyler Aas, LPN Nurse Health Advisor   Nurse Notes:Pateint concerned with weight loss. States he is just losing weight slowly throughout the year but no changes in diet or activities.

## 2017-07-13 NOTE — Patient Instructions (Addendum)
Mr. Schrieber , Thank you for taking time to come for your Medicare Wellness Visit. I appreciate your ongoing commitment to your health goals. Please review the following plan we discussed and let me know if I can assist you in the future.   Screening recommendations/referrals: Colonoscopy: declined colonoscopy, please bring copy of hemoccult testing.  Recommended yearly ophthalmology/optometry visit for glaucoma screening and checkup Recommended yearly dental visit for hygiene and checkup  Vaccinations: Influenza vaccine: up to date Pneumococcal vaccine: up to date Tdap vaccine: up to date Shingles vaccine: Eligible for shingrix, check with your insurance company for coverage.     Advanced directives: Advance directive discussed with you today. Even though you declined this today please call our office should you change your mind and we can give you the proper paperwork for you to fill out.  Conditions/risks identified: smoking cessation discussed, not ready to quit at this time.  Burgess Estelle, RN will call you to discuss lung cancer screening.   Next appointment: Follow up on 07/17/2017 at 10:00am with Regino Schultze. Follow up in one year for your annual wellness exam.   Preventive Care 72 Years and Older, Male Preventive care refers to lifestyle choices and visits with your health care provider that can promote health and wellness. What does preventive care include?  A yearly physical exam. This is also called an annual well check.  Dental exams once or twice a year.  Routine eye exams. Ask your health care provider how often you should have your eyes checked.  Personal lifestyle choices, including:  Daily care of your teeth and gums.  Regular physical activity.  Eating a healthy diet.  Avoiding tobacco and drug use.  Limiting alcohol use.  Practicing safe sex.  Taking low doses of aspirin every day.  Taking vitamin and mineral supplements as recommended by your  health care provider. What happens during an annual well check? The services and screenings done by your health care provider during your annual well check will depend on your age, overall health, lifestyle risk factors, and family history of disease. Counseling  Your health care provider may ask you questions about your:  Alcohol use.  Tobacco use.  Drug use.  Emotional well-being.  Home and relationship well-being.  Sexual activity.  Eating habits.  History of falls.  Memory and ability to understand (cognition).  Work and work Statistician. Screening  You may have the following tests or measurements:  Height, weight, and BMI.  Blood pressure.  Lipid and cholesterol levels. These may be checked every 5 years, or more frequently if you are over 72 years old.  Skin check.  Lung cancer screening. You may have this screening every year starting at age 72 if you have a 30-pack-year history of smoking and currently smoke or have quit within the past 15 years.  Fecal occult blood test (FOBT) of the stool. You may have this test every year starting at age 72.  Flexible sigmoidoscopy or colonoscopy. You may have a sigmoidoscopy every 5 years or a colonoscopy every 10 years starting at age 72.  Prostate cancer screening. Recommendations will vary depending on your family history and other risks.  Hepatitis C blood test.  Hepatitis B blood test.  Sexually transmitted disease (STD) testing.  Diabetes screening. This is done by checking your blood sugar (glucose) after you have not eaten for a while (fasting). You may have this done every 1-3 years.  Abdominal aortic aneurysm (AAA) screening. You may need this if you  are a current or former smoker.  Osteoporosis. You may be screened starting at age 72 if you are at high risk. Talk with your health care provider about your test results, treatment options, and if necessary, the need for more tests. Vaccines  Your health  care provider may recommend certain vaccines, such as:  Influenza vaccine. This is recommended every year.  Tetanus, diphtheria, and acellular pertussis (Tdap, Td) vaccine. You may need a Td booster every 10 years.  Zoster vaccine. You may need this after age 72.  Pneumococcal 13-valent conjugate (PCV13) vaccine. One dose is recommended after age 72.  Pneumococcal polysaccharide (PPSV23) vaccine. One dose is recommended after age 72. Talk to your health care provider about which screenings and vaccines you need and how often you need them. This information is not intended to replace advice given to you by your health care provider. Make sure you discuss any questions you have with your health care provider. Document Released: 05/04/2015 Document Revised: 12/26/2015 Document Reviewed: 02/06/2015 Elsevier Interactive Patient Education  2017 [employer] Prevention in the Home Falls can cause injuries. They can happen to people of all ages. There are many things you can do to make your home safe and to help prevent falls. What can I do on the outside of my home?  Regularly fix the edges of walkways and driveways and fix any cracks.  Remove anything that might make you trip as you walk through a door, such as a raised step or threshold.  Trim any bushes or trees on the path to your home.  Use bright outdoor lighting.  Clear any walking paths of anything that might make someone trip, such as rocks or tools.  Regularly check to see if handrails are loose or broken. Make sure that both sides of any steps have handrails.  Any raised decks and porches should have guardrails on the edges.  Have any leaves, snow, or ice cleared regularly.  Use sand or salt on walking paths during winter.  Clean up any spills in your garage right away. This includes oil or grease spills. What can I do in the bathroom?  Use night lights.  Install grab bars by the toilet and in the tub and  shower. Do not use towel bars as grab bars.  Use non-skid mats or decals in the tub or shower.  If you need to sit down in the shower, use a plastic, non-slip stool.  Keep the floor dry. Clean up any water that spills on the floor as soon as it happens.  Remove soap buildup in the tub or shower regularly.  Attach bath mats securely with double-sided non-slip rug tape.  Do not have throw rugs and other things on the floor that can make you trip. What can I do in the bedroom?  Use night lights.  Make sure that you have a light by your bed that is easy to reach.  Do not use any sheets or blankets that are too big for your bed. They should not hang down onto the floor.  Have a firm chair that has side arms. You can use this for support while you get dressed.  Do not have throw rugs and other things on the floor that can make you trip. What can I do in the kitchen?  Clean up any spills right away.  Avoid walking on wet floors.  Keep items that you use a lot in easy-to-reach places.  If you need to reach  something above you, use a strong step stool that has a grab bar.  Keep electrical cords out of the way.  Do not use floor polish or wax that makes floors slippery. If you must use wax, use non-skid floor wax.  Do not have throw rugs and other things on the floor that can make you trip. What can I do with my stairs?  Do not leave any items on the stairs.  Make sure that there are handrails on both sides of the stairs and use them. Fix handrails that are broken or loose. Make sure that handrails are as long as the stairways.  Check any carpeting to make sure that it is firmly attached to the stairs. Fix any carpet that is loose or worn.  Avoid having throw rugs at the top or bottom of the stairs. If you do have throw rugs, attach them to the floor with carpet tape.  Make sure that you have a light switch at the top of the stairs and the bottom of the stairs. If you do not  have them, ask someone to add them for you. What else can I do to help prevent falls?  Wear shoes that:  Do not have high heels.  Have rubber bottoms.  Are comfortable and fit you well.  Are closed at the toe. Do not wear sandals.  If you use a stepladder:  Make sure that it is fully opened. Do not climb a closed stepladder.  Make sure that both sides of the stepladder are locked into place.  Ask someone to hold it for you, if possible.  Clearly mark and make sure that you can see:  Any grab bars or handrails.  First and last steps.  Where the edge of each step is.  Use tools that help you move around (mobility aids) if they are needed. These include:  Canes.  Walkers.  Scooters.  Crutches.  Turn on the lights when you go into a dark area. Replace any light bulbs as soon as they burn out.  Set up your furniture so you have a clear path. Avoid moving your furniture around.  If any of your floors are uneven, fix them.  If there are any pets around you, be aware of where they are.  Review your medicines with your doctor. Some medicines can make you feel dizzy. This can increase your chance of falling. Ask your doctor what other things that you can do to help prevent falls. This information is not intended to replace advice given to you by your health care provider. Make sure you discuss any questions you have with your health care provider. Document Released: 02/01/2009 Document Revised: 09/13/2015 Document Reviewed: 05/12/2014 Elsevier Interactive Patient Education  2017 [employer].  Steps to Quit Smoking Smoking tobacco can be bad for your health. It can also affect almost every organ in your body. Smoking puts you and people around you at risk for many serious long-lasting (chronic) diseases. Quitting smoking is hard, but it is one of the best things that you can do for your health. It is never too late to quit. What are the benefits of quitting  smoking? When you quit smoking, you lower your risk for getting serious diseases and conditions. They can include:  Lung cancer or lung disease.  Heart disease.  Stroke.  Heart attack.  Not being able to have children (infertility).  Weak bones (osteoporosis) and broken bones (fractures).  If you have coughing, wheezing, and shortness of  breath, those symptoms may get better when you quit. You may also get sick less often. If you are pregnant, quitting smoking can help to lower your chances of having a baby of low birth weight. What can I do to help me quit smoking? Talk with your doctor about what can help you quit smoking. Some things you can do (strategies) include:  Quitting smoking totally, instead of slowly cutting back how much you smoke over a period of time.  Going to in-person counseling. You are more likely to quit if you go to many counseling sessions.  Using resources and support systems, such as: ? Database administrator with a Social worker. ? Phone quitlines. ? Careers information officer. ? Support groups or group counseling. ? Text messaging programs. ? Mobile phone apps or applications.  Taking medicines. Some of these medicines may have nicotine in them. If you are pregnant or breastfeeding, do not take any medicines to quit smoking unless your doctor says it is okay. Talk with your doctor about counseling or other things that can help you.  Talk with your doctor about using more than one strategy at the same time, such as taking medicines while you are also going to in-person counseling. This can help make quitting easier. What things can I do to make it easier to quit? Quitting smoking might feel very hard at first, but there is a lot that you can do to make it easier. Take these steps:  Talk to your family and friends. Ask them to support and encourage you.  Call phone quitlines, reach out to support groups, or work with a Social worker.  Ask people who smoke to not  smoke around you.  Avoid places that make you want (trigger) to smoke, such as: ? Bars. ? Parties. ? Smoke-break areas at work.  Spend time with people who do not smoke.  Lower the stress in your life. Stress can make you want to smoke. Try these things to help your stress: ? Getting regular exercise. ? Deep-breathing exercises. ? Yoga. ? Meditating. ? Doing a body scan. To do this, close your eyes, focus on one area of your body at a time from head to toe, and notice which parts of your body are tense. Try to relax the muscles in those areas.  Download or buy apps on your mobile phone or tablet that can help you stick to your quit plan. There are many free apps, such as QuitGuide from the [employer] Office manager for Disease Control and Prevention). You can find more support from smokefree.gov and other websites.  This information is not intended to replace advice given to you by your health care provider. Make sure you discuss any questions you have with your health care provider. Document Released: 02/01/2009 Document Revised: 12/04/2015 Document Reviewed: 08/22/2014 Elsevier Interactive Patient Education  2018 [employer].

## 2017-07-14 ENCOUNTER — Telehealth: Payer: Self-pay | Admitting: *Deleted

## 2017-07-14 ENCOUNTER — Encounter: Payer: Self-pay | Admitting: Unknown Physician Specialty

## 2017-07-14 DIAGNOSIS — Z87891 Personal history of nicotine dependence: Secondary | ICD-10-CM

## 2017-07-14 DIAGNOSIS — Z122 Encounter for screening for malignant neoplasm of respiratory organs: Secondary | ICD-10-CM

## 2017-07-14 NOTE — Telephone Encounter (Signed)
Received referral for initial lung cancer screening scan. Contacted patient and obtained smoking history,(current, 31.8 pack year) as well as answering questions related to screening process. Patient denies signs of lung cancer such as weight loss or hemoptysis. Patient denies comorbidity that would prevent curative treatment if lung cancer were found. Patient is scheduled for shared decision making visit and CT scan on 07/21/17.

## 2017-07-17 ENCOUNTER — Ambulatory Visit
Admission: RE | Admit: 2017-07-17 | Discharge: 2017-07-17 | Disposition: A | Discharge status: Home / Self Care | Payer: Medicare Other | Source: Ambulatory Visit | Attending: Unknown Physician Specialty | Admitting: Unknown Physician Specialty

## 2017-07-17 ENCOUNTER — Encounter: Payer: Self-pay | Admitting: Unknown Physician Specialty

## 2017-07-17 ENCOUNTER — Ambulatory Visit (INDEPENDENT_AMBULATORY_CARE_PROVIDER_SITE_OTHER): Payer: Medicare Other | Admitting: Unknown Physician Specialty

## 2017-07-17 VITALS — BP 142/83 | HR 52 | Temp 98.2°F | Ht 68.0 in | Wt 171.3 lb

## 2017-07-17 DIAGNOSIS — E0822 Diabetes mellitus due to underlying condition with diabetic chronic kidney disease: Secondary | ICD-10-CM | POA: Diagnosis not present

## 2017-07-17 DIAGNOSIS — I129 Hypertensive chronic kidney disease with stage 1 through stage 4 chronic kidney disease, or unspecified chronic kidney disease: Secondary | ICD-10-CM | POA: Diagnosis not present

## 2017-07-17 DIAGNOSIS — R634 Abnormal weight loss: Secondary | ICD-10-CM | POA: Insufficient documentation

## 2017-07-17 DIAGNOSIS — F17200 Nicotine dependence, unspecified, uncomplicated: Secondary | ICD-10-CM | POA: Diagnosis not present

## 2017-07-17 DIAGNOSIS — J449 Chronic obstructive pulmonary disease, unspecified: Secondary | ICD-10-CM

## 2017-07-17 DIAGNOSIS — R918 Other nonspecific abnormal finding of lung field: Secondary | ICD-10-CM | POA: Diagnosis not present

## 2017-07-17 IMAGING — DX DG CHEST 2V
2 series · 3 of 3 positions shown · non-contrast
Comparison: Report [DATE]

CLINICAL DATA: Weight loss, smoker

EXAM:
CHEST - 2 VIEW

[Series 1: chest pa · 0.14mm/px · 2 of 2 slices shown]
[im 1/2]
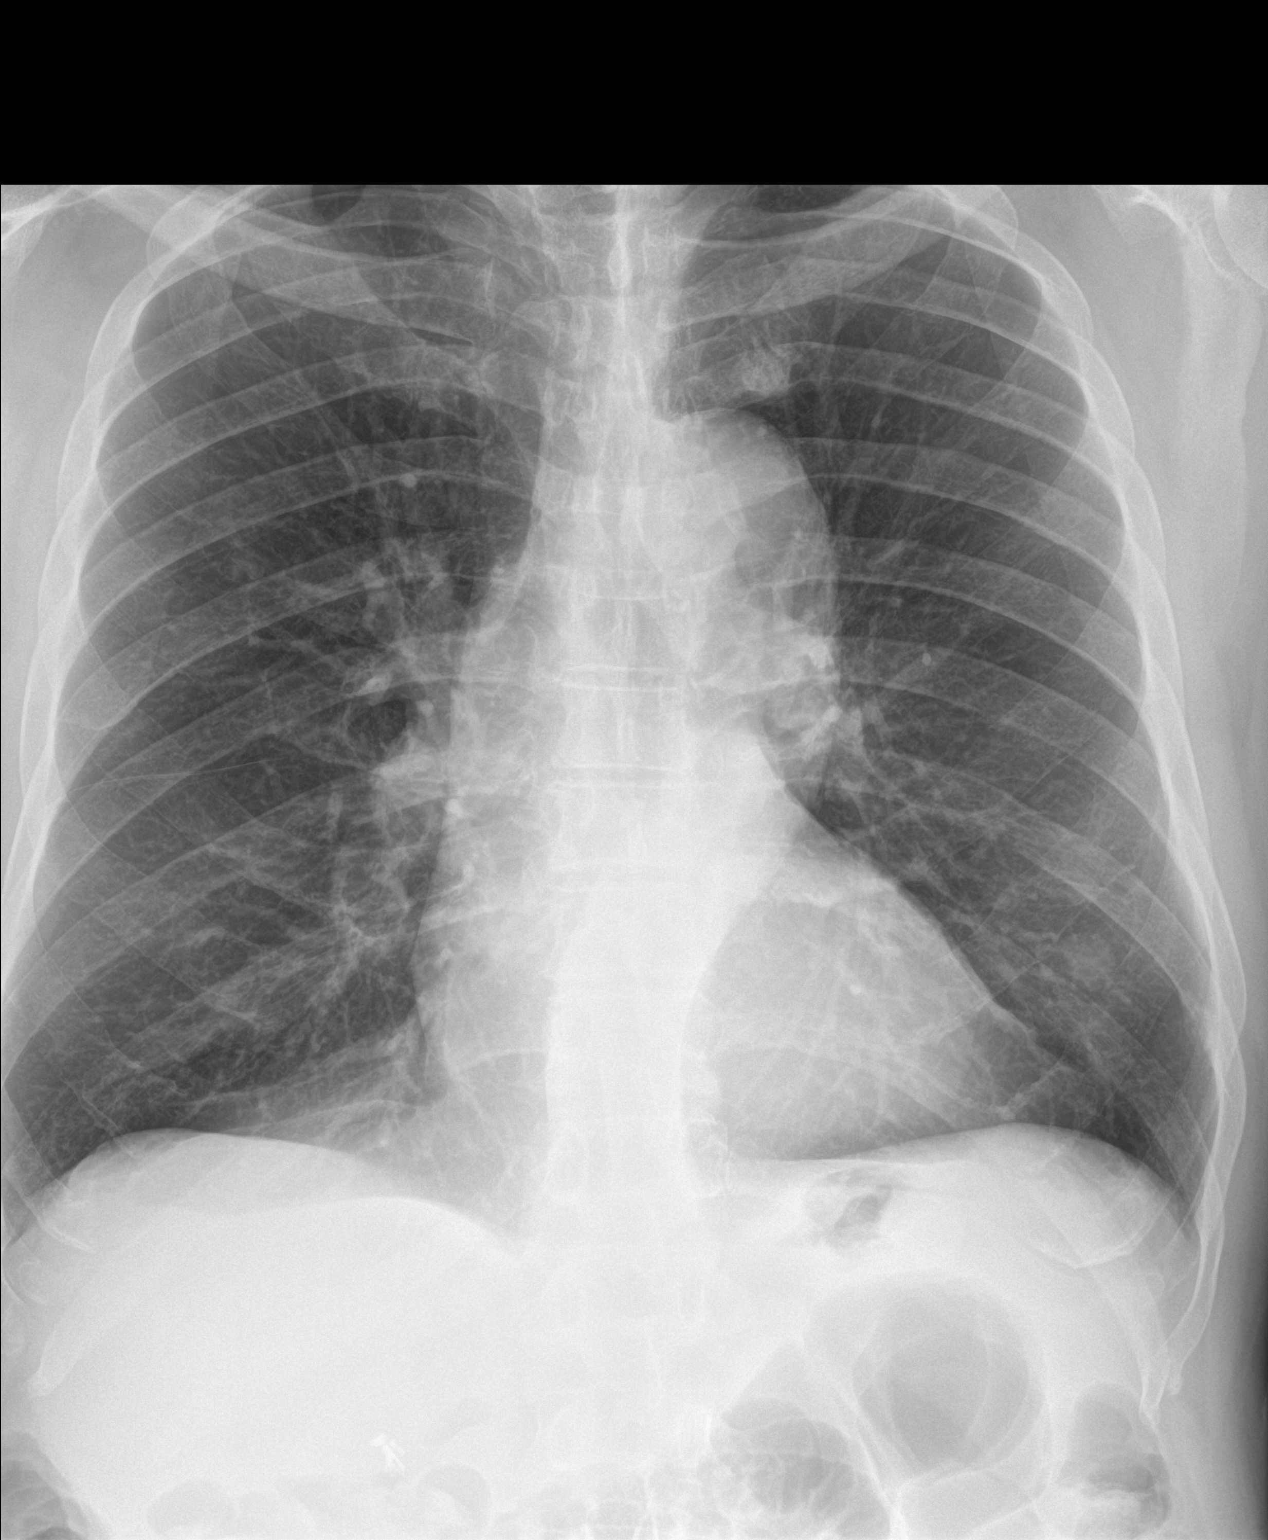
[im 2/2]
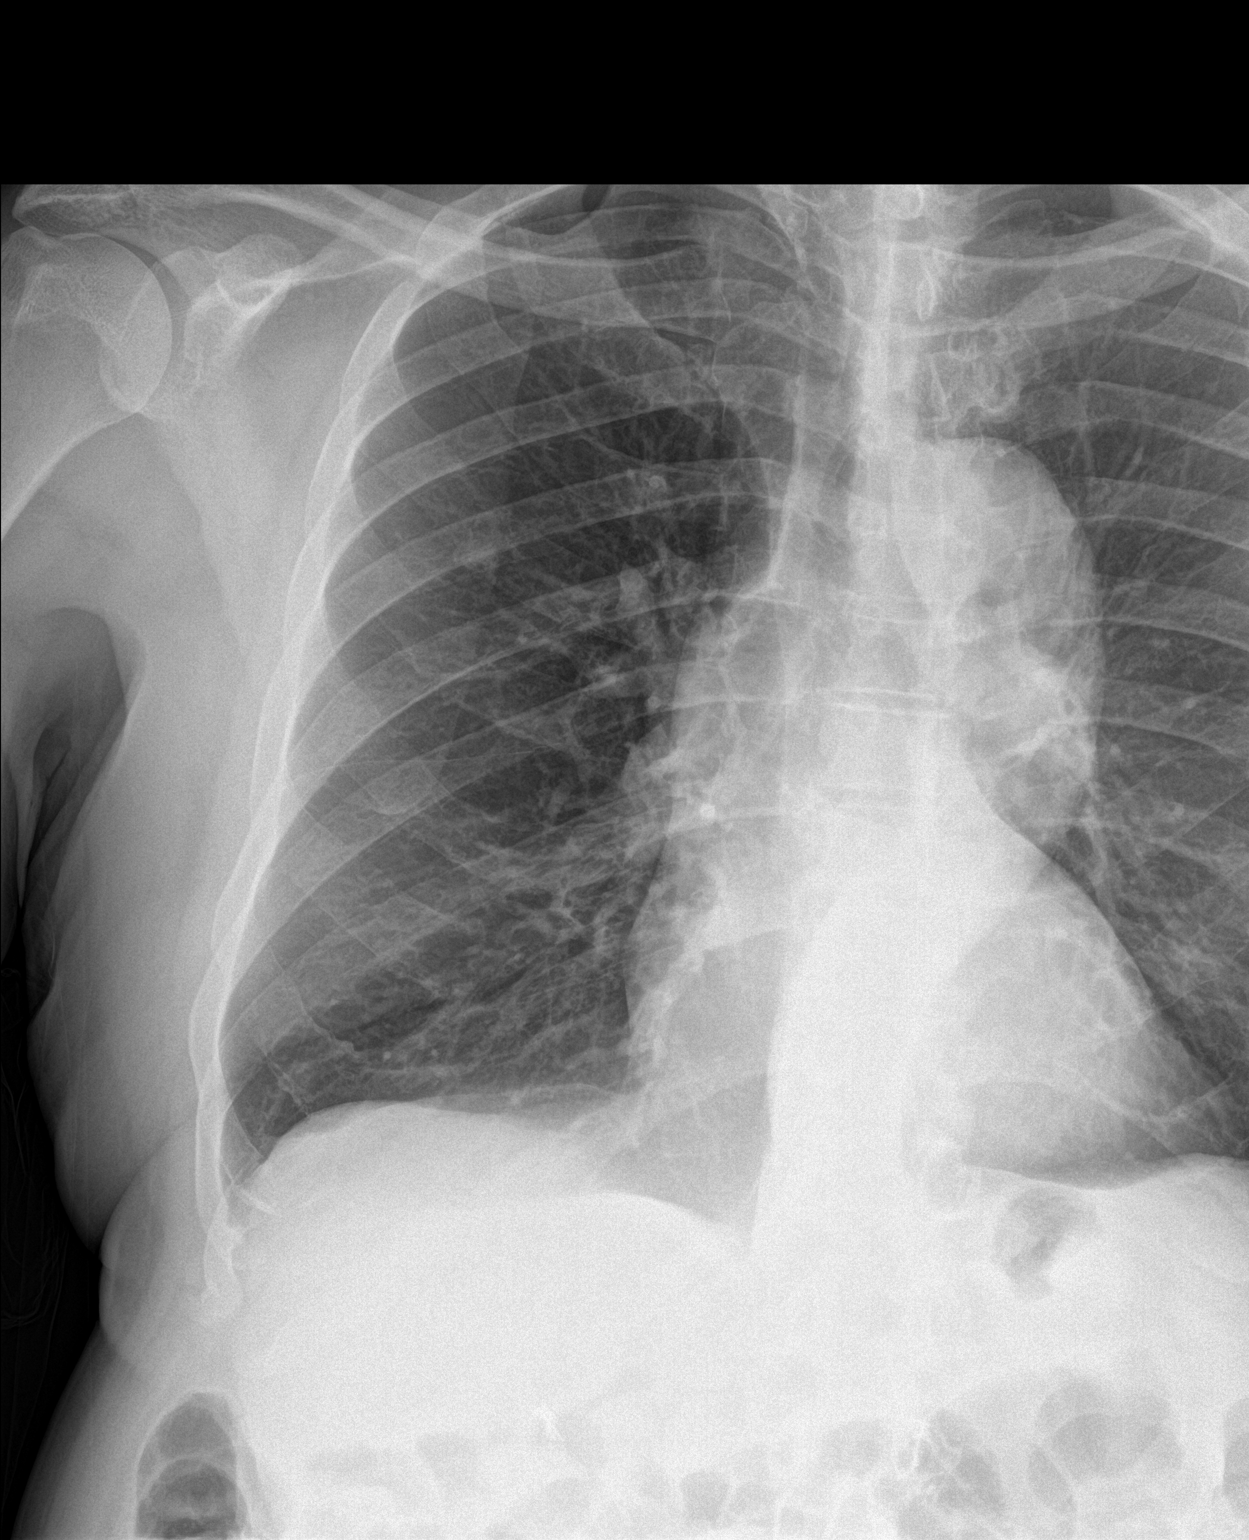

[chest lat]
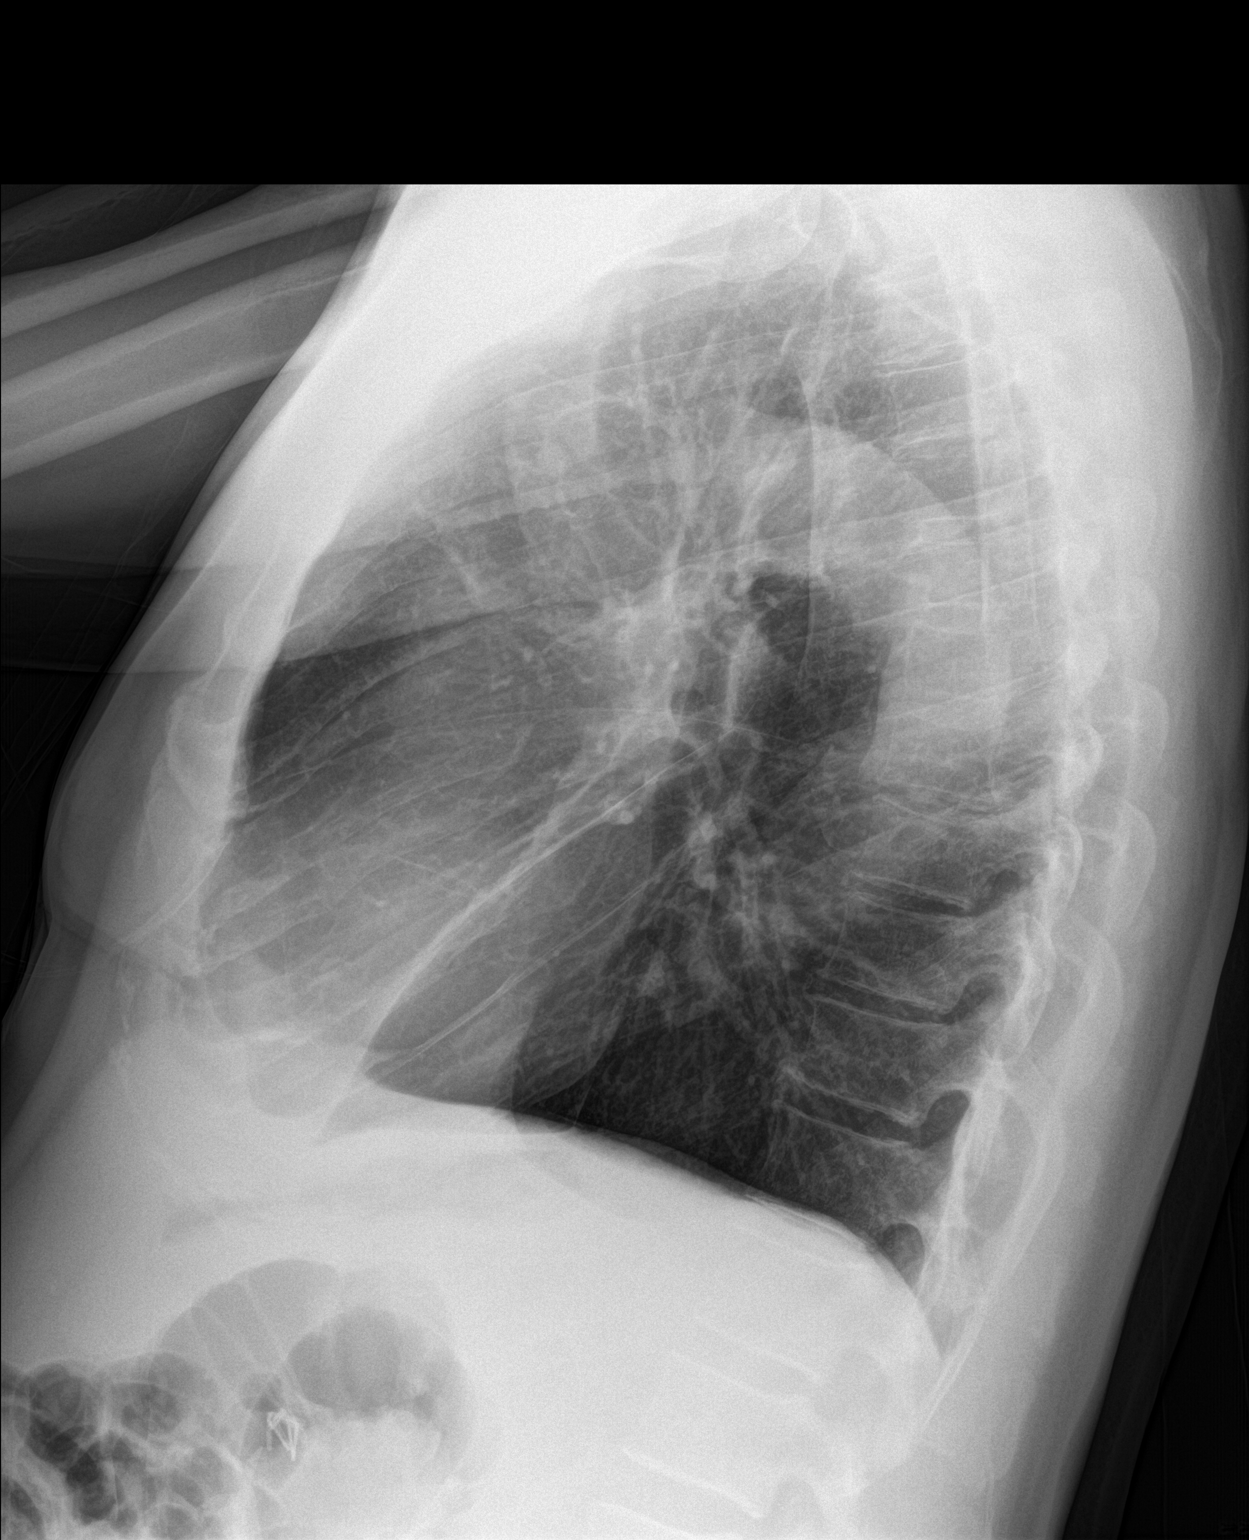

[3 of 3 positions shown; findings below may reference images not displayed]

FINDINGS: No acute pulmonary infiltrate or effusion. Nodular opacity left
lower lung. Heart size within normal limits. Mild tortuosity of the
aorta. No pneumothorax.
IMPRESSION: No active cardiopulmonary disease. Nodular opacity at the left lower
lung could reflect nipple shadow or nodule. Suggest repeat chest
x-ray with nipple marker.

## 2017-07-17 NOTE — Assessment & Plan Note (Signed)
Stable, continue present medications.   

## 2017-07-17 NOTE — Progress Notes (Signed)
BP (!) 142/83 (BP Location: Left Arm, Cuff Size: Normal)   Pulse (!) 52   Temp 98.2 F (36.8 C) (Oral)   Ht 5\' 8"  (1.727 m)   Wt 171 lb 4.8 oz (77.7 kg)   SpO2 98%   BMI 26.05 kg/m    Subjective:    Patient ID: Philip Wood, male    DOB: [birth year], 72 y.o.   MRN: [id number]  HPI: [name] is a 72 y.o. male  Chief Complaint  Patient presents with  . Annual Exam    pt had wellness exam with [facility] 07/13/17   Diabetes: Using medications without difficulties No hypoglycemic episodes No hyperglycemic episodes Feet problems: none Blood Sugars averaging: Not checking eye exam within last year Last Hgb A1C: 5.9%  Hypertension  Using medications without difficulty Average home BPs Not checking  Using medication without problems or lightheadedness No chest pain with exertion or shortness of breath No Edema  Elevated Cholesterol Using medications without problems No Muscle aches  Diet: Exercise: Lost 15 pounds in the last 6 months not on purpose  COPD Stable.  Smoking 1/2 ppd  Social History   Socioeconomic History  . Marital status: Married    Spouse name: Not on file  . Number of children: Not on file  . Years of education: Not on file  . Highest education level: Not on file  Occupational History  . Not on file  Social Needs  . Financial resource strain: Not hard at all  . Food insecurity:    Worry: Never true    Inability: Never true  . Transportation needs:    Medical: No    Non-medical: No  Tobacco Use  . Smoking status: Current Every Day Smoker    Packs/day: 0.50    Types: Cigarettes  . Smokeless tobacco: Never Used  Substance and Sexual Activity  . Alcohol use: No    Alcohol/week: 0.0 oz  . Drug use: No  . Sexual activity: Not Currently  Lifestyle  . Physical activity:    Days per week: 0 days    Minutes per session: 0 min  . Stress: Not at all  Relationships  . Social connections:    Talks on phone: Twice a week    Gets  together: Once a week    Attends religious service: More than 4 times per year    Active member of club or organization: No    Attends meetings of clubs or organizations: Never    Relationship status: Married  . Intimate partner violence:    Fear of current or ex partner: No    Emotionally abused: No    Physically abused: No    Forced sexual activity: No  Other Topics Concern  . Not on file  Social History Narrative   Works part time at [employer]   Family History  Problem Relation Age of Onset  . Heart disease Father   . Diabetes Sister   . Stroke Sister   . Cancer Brother        lung  . Cancer Brother        lung  . Cancer Brother        throat  . Diabetes Sister   . Cancer Brother        lung   Past Medical History:  Diagnosis Date  . Cancer (HCC)    laryngeal  . Chronic kidney disease   . COPD (chronic obstructive pulmonary disease) ([place])   . Diabetes  mellitus without complication ([place])   . Hyperlipidemia   . Hypertension   . Lumbago   . Tobacco abuse disorder    Past Surgical History:  Procedure Laterality Date  . CHOLECYSTECTOMY    . THROAT SURGERY      Relevant past medical, surgical, family and social history reviewed and updated as indicated. Interim medical history since our last visit reviewed. Allergies and medications reviewed and updated.  Review of Systems  Constitutional: Positive for unexpected weight change. Negative for activity change, chills and fever.  HENT: Negative.   Respiratory: Positive for cough.   Cardiovascular: Negative for chest pain, palpitations and leg swelling.  Gastrointestinal: Negative.   Genitourinary: Negative.   Musculoskeletal: Negative.   Psychiatric/Behavioral: Negative.     Per HPI unless specifically indicated above     Objective:    BP (!) 142/83 (BP Location: Left Arm, Cuff Size: Normal)   Pulse (!) 52   Temp 98.2 F (36.8 C) (Oral)   Ht 5\' 8"  (1.727 m)   Wt 171 lb 4.8 oz (77.7 kg)   SpO2 98%   BMI  26.05 kg/m   Wt Readings from Last 3 Encounters:  07/17/17 171 lb 4.8 oz (77.7 kg)  07/13/17 171 lb 4.8 oz (77.7 kg)  01/12/17 186 lb 1.6 oz (84.4 kg)    Physical Exam  Constitutional: He is oriented to person, place, and time. He appears well-developed and well-nourished. No distress.  HENT:  Head: Normocephalic and atraumatic.  Eyes: Conjunctivae and lids are normal. Right eye exhibits no discharge. Left eye exhibits no discharge. No scleral icterus.  Neck: Normal range of motion. Neck supple. No JVD present. Carotid bruit is not present.  Cardiovascular: Normal rate, regular rhythm and normal heart sounds.  Pulmonary/Chest: Effort normal and breath sounds normal. No respiratory distress.  Abdominal: Normal appearance. There is no splenomegaly or hepatomegaly.  Musculoskeletal: Normal range of motion.  Neurological: He is alert and oriented to person, place, and time.  Skin: Skin is warm, dry and intact. No rash noted. No pallor.  Psychiatric: He has a normal mood and affect. His behavior is normal. Judgment and thought content normal.    Results for orders placed or performed in visit on 01/12/17  Comprehensive metabolic panel  Result Value Ref Range   Glucose 96 65 - 99 mg/dL   BUN 15 8 - 27 mg/dL   Creatinine, Ser 1.18 0.76 - 1.27 mg/dL   GFR calc non Af Amer 62 >59 mL/min/1.73   GFR calc Af Amer 71 >59 mL/min/1.73   BUN/Creatinine Ratio 13 10 - 24   Sodium 140 134 - 144 mmol/L   Potassium 5.3 (H) 3.5 - 5.2 mmol/L   Chloride 101 96 - 106 mmol/L   CO2 25 20 - 29 mmol/L   Calcium 9.1 8.6 - 10.2 mg/dL   Total Protein 5.8 (L) 6.0 - 8.5 g/dL   Albumin 4.0 3.5 - 4.8 g/dL   Globulin, Total 1.8 1.5 - 4.5 g/dL   Albumin/Globulin Ratio 2.2 1.2 - 2.2   Bilirubin Total 0.4 0.0 - 1.2 mg/dL   Alkaline Phosphatase 92 39 - 117 IU/L   AST 13 0 - 40 IU/L   ALT 9 0 - 44 IU/L  Lipid Panel w/o Chol/HDL Ratio  Result Value Ref Range   Cholesterol, Total 171 100 - 199 mg/dL    Triglycerides 103 0 - 149 mg/dL   HDL 46 >39 mg/dL   VLDL Cholesterol Cal 21 5 - 40 mg/dL  LDL Calculated 104 (H) 0 - 99 mg/dL  Bayer DCA Hb A1c Waived  Result Value Ref Range   Bayer DCA Hb A1c Waived 5.9 <7.0 %      Assessment & Plan:   Problem List Items Addressed This Visit      Unprioritized   COPD (chronic obstructive pulmonary disease) (HCC)    Stable, continue present medications.        Diabetes mellitus with chronic kidney disease (HCC)    Check Hgb A1C and creatnine.  Good control through the past years.        Relevant Orders   Comprehensive metabolic panel   Lipid Panel w/o Chol/HDL Ratio   Hypertensive CKD (chronic kidney disease)    Stable, continue present medications.        Relevant Orders   Lipid Panel w/o Chol/HDL Ratio   Tobacco use disorder   Relevant Orders   DG Chest 2 View    Other Visit Diagnoses    Weight loss    -  Primary   CT scan of lungs on Tuesday. Get chest x-ray today to make sure correct CT is being edone.  FIT test negative   Relevant Orders   DG Chest 2 View   CBC with Differential/Platelet   TSH   PSA       Follow up plan: Return in about 1 month (around 08/14/2017).

## 2017-07-17 NOTE — Assessment & Plan Note (Signed)
Check Hgb A1C and creatnine.  Good control through the past years.

## 2017-07-18 ENCOUNTER — Other Ambulatory Visit: Payer: Self-pay | Admitting: Unknown Physician Specialty

## 2017-07-18 LAB — COMPREHENSIVE METABOLIC PANEL
ALK PHOS: 90 IU/L (ref 39–117)
ALT: 8 IU/L (ref 0–44)
AST: 9 IU/L (ref 0–40)
Albumin/Globulin Ratio: 1.9 (ref 1.2–2.2)
Albumin: 3.8 g/dL (ref 3.5–4.8)
BUN/Creatinine Ratio: 13 (ref 10–24)
BUN: 14 mg/dL (ref 8–27)
Bilirubin Total: 0.5 mg/dL (ref 0.0–1.2)
CO2: 27 mmol/L (ref 20–29)
CREATININE: 1.04 mg/dL (ref 0.76–1.27)
Calcium: 9.2 mg/dL (ref 8.6–10.2)
Chloride: 103 mmol/L (ref 96–106)
GFR calc Af Amer: 83 mL/min/{1.73_m2} (ref >59)
GFR calc non Af Amer: 72 mL/min/{1.73_m2} (ref >59)
GLOBULIN, TOTAL: 2 g/dL (ref 1.5–4.5)
Glucose: 96 mg/dL (ref 65–99)
Potassium: 5.3 mmol/L — ABNORMAL HIGH (ref 3.5–5.2)
SODIUM: 142 mmol/L (ref 134–144)
Total Protein: 5.8 g/dL — ABNORMAL LOW (ref 6.0–8.5)

## 2017-07-18 LAB — CBC WITH DIFFERENTIAL/PLATELET
Basophils Absolute: 0 10*3/uL (ref 0.0–0.2)
Basos: 0 %
EOS (ABSOLUTE): 0.7 10*3/uL — AB (ref 0.0–0.4)
Eos: 11 %
Hematocrit: 44 % (ref 37.5–51.0)
Hemoglobin: 15.1 g/dL (ref 13.0–17.7)
Immature Grans (Abs): 0 10*3/uL (ref 0.0–0.1)
Immature Granulocytes: 0 %
LYMPHS ABS: 2 10*3/uL (ref 0.7–3.1)
Lymphs: 33 %
MCH: 31.7 pg (ref 26.6–33.0)
MCHC: 34.3 g/dL (ref 31.5–35.7)
MCV: 92 fL (ref 79–97)
Monocytes Absolute: 0.6 10*3/uL (ref 0.1–0.9)
Monocytes: 9 %
Neutrophils Absolute: 2.8 10*3/uL (ref 1.4–7.0)
Neutrophils: 47 %
Platelets: 166 10*3/uL (ref 150–379)
RBC: 4.76 x10E6/uL (ref 4.14–5.80)
RDW: 13.8 % (ref 12.3–15.4)
WBC: 6.1 10*3/uL (ref 3.4–10.8)

## 2017-07-18 LAB — LIPID PANEL W/O CHOL/HDL RATIO
CHOLESTEROL TOTAL: 166 mg/dL (ref 100–199)
HDL: 42 mg/dL (ref >39)
LDL CALC: 102 mg/dL — AB (ref 0–99)
TRIGLYCERIDES: 111 mg/dL (ref 0–149)
VLDL Cholesterol Cal: 22 mg/dL (ref 5–40)

## 2017-07-18 LAB — PSA: Prostate Specific Ag, Serum: 2.4 ng/mL (ref 0.0–4.0)

## 2017-07-18 LAB — TSH: TSH: 2.68 u[IU]/mL (ref 0.450–4.500)

## 2017-07-20 ENCOUNTER — Other Ambulatory Visit: Payer: Self-pay | Admitting: Unknown Physician Specialty

## 2017-07-20 MED ORDER — ATORVASTATIN CALCIUM 40 MG PO TABS
40.0000 mg | ORAL_TABLET | Freq: Every day | ORAL | 3 refills | Status: DC
Start: 2017-07-20 — End: 2018-02-12

## 2017-07-21 ENCOUNTER — Inpatient Hospital Stay: Payer: Medicare Other | Attending: Oncology | Admitting: Oncology

## 2017-07-21 ENCOUNTER — Ambulatory Visit
Admission: RE | Admit: 2017-07-21 | Discharge: 2017-07-21 | Disposition: A | Discharge status: Home / Self Care | Payer: Medicare Other | Source: Ambulatory Visit | Attending: Oncology | Admitting: Oncology

## 2017-07-21 ENCOUNTER — Encounter: Payer: Self-pay | Admitting: Oncology

## 2017-07-21 DIAGNOSIS — Z87891 Personal history of nicotine dependence: Secondary | ICD-10-CM

## 2017-07-21 DIAGNOSIS — Z8521 Personal history of malignant neoplasm of larynx: Secondary | ICD-10-CM | POA: Insufficient documentation

## 2017-07-21 DIAGNOSIS — Z122 Encounter for screening for malignant neoplasm of respiratory organs: Secondary | ICD-10-CM

## 2017-07-21 DIAGNOSIS — I2510 Atherosclerotic heart disease of native coronary artery without angina pectoris: Secondary | ICD-10-CM | POA: Diagnosis not present

## 2017-07-21 DIAGNOSIS — J439 Emphysema, unspecified: Secondary | ICD-10-CM | POA: Diagnosis not present

## 2017-07-21 DIAGNOSIS — I712 Thoracic aortic aneurysm, without rupture: Secondary | ICD-10-CM | POA: Diagnosis not present

## 2017-07-21 DIAGNOSIS — I700 Atherosclerosis of aorta: Secondary | ICD-10-CM | POA: Insufficient documentation

## 2017-07-21 DIAGNOSIS — R918 Other nonspecific abnormal finding of lung field: Secondary | ICD-10-CM | POA: Insufficient documentation

## 2017-07-21 IMAGING — CT CT CHEST LUNG CANCER SCREENING LOW DOSE W/O CM
2 of 5 series · 15 of 40 positions shown, 18 images · non-contrast
Comparison: None.

CLINICAL DATA: Current smoker, 32 pack-year history, lung cancer
screening.

EXAM:
CT CHEST WITHOUT CONTRAST LOW-DOSE FOR LUNG CANCER SCREENING
TECHNIQUE: Multidetector CT imaging of the chest was performed following the
standard protocol without IV contrast.

[Series 4: lung · axial · 0.69mm/px · z∈[-1212,-918]mm · 12 of 324 slices shown, 15 images (1 of 2)]
[im 15/324  mediastinal]
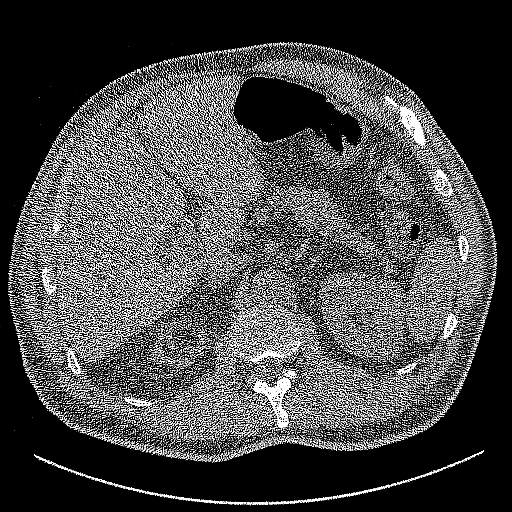
[im 15/324  lung]
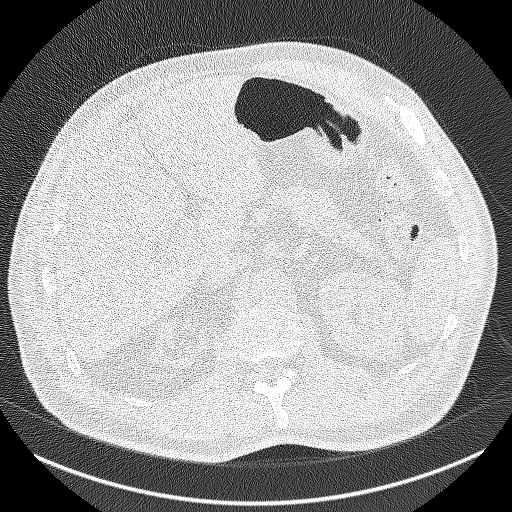
[im 45/324  lung]
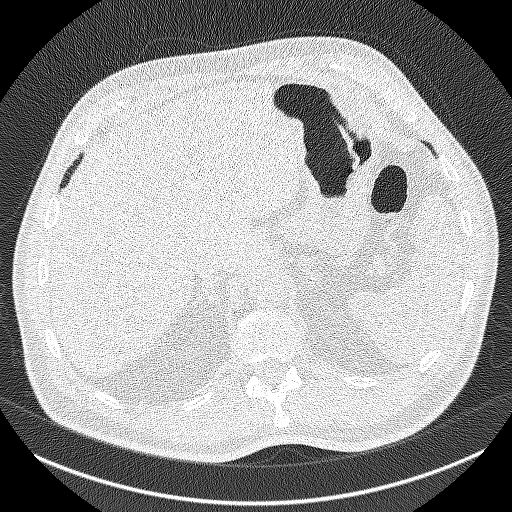
[im 74/324  lung]
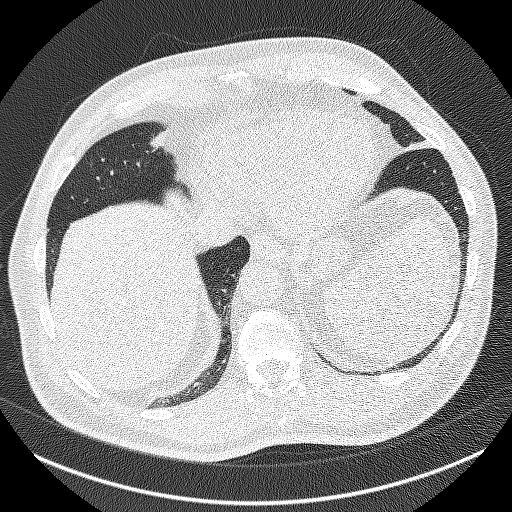
[im 103/324  lung]
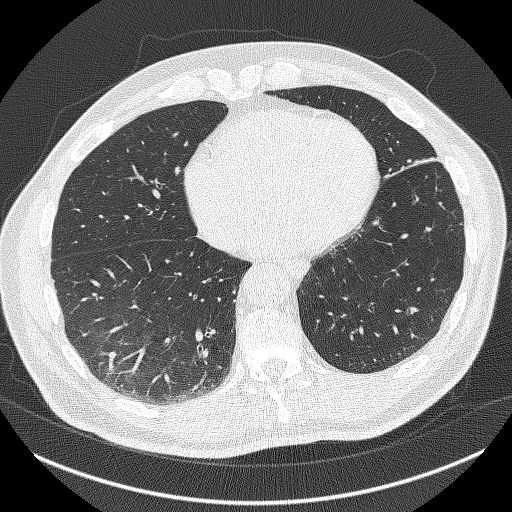
[im 118/324  mediastinal]
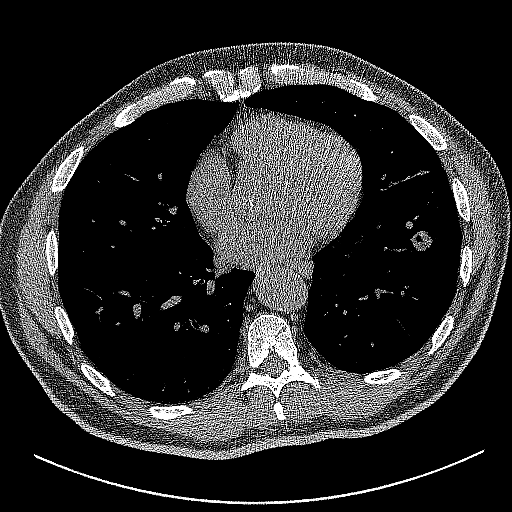
[im 118/324  lung]
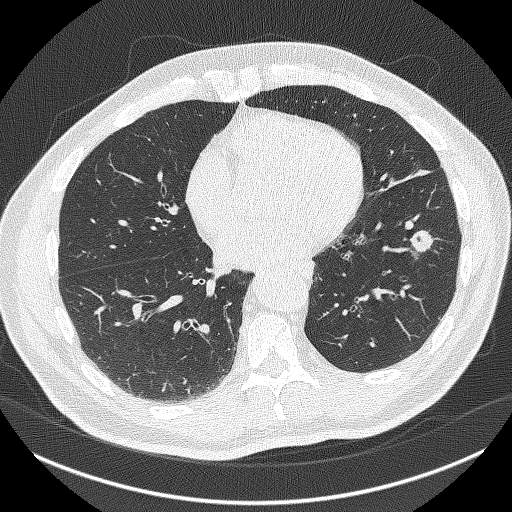
[im 147/324  lung]
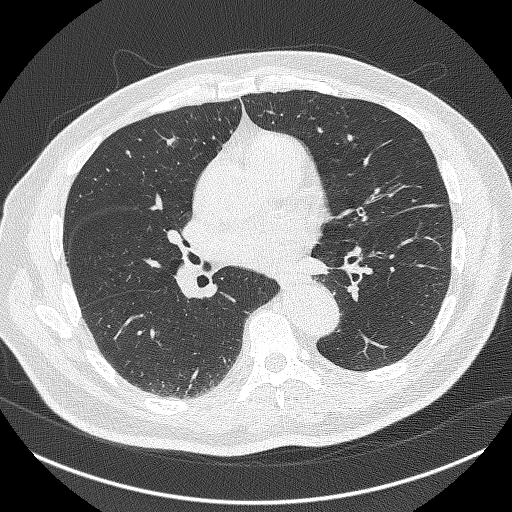
[im 177/324  lung]
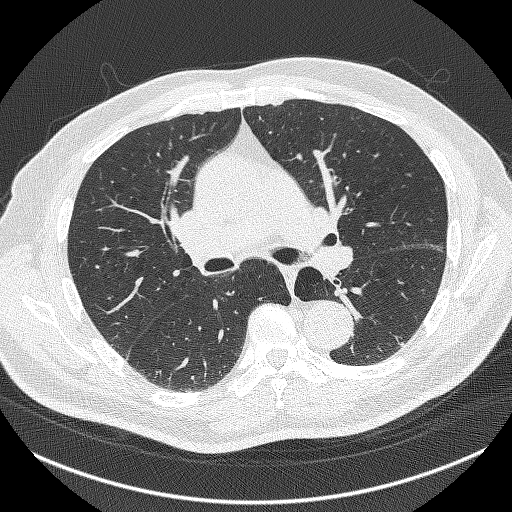
[im 206/324  lung]
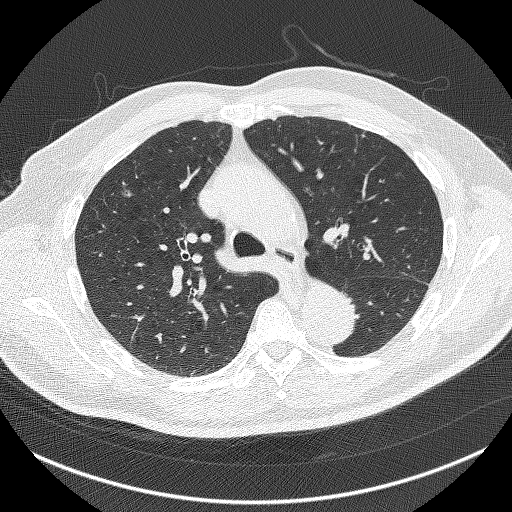
[im 221/324  mediastinal]
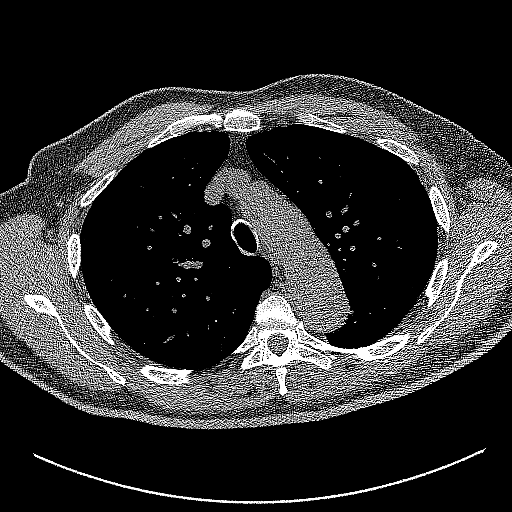
[im 221/324  lung]
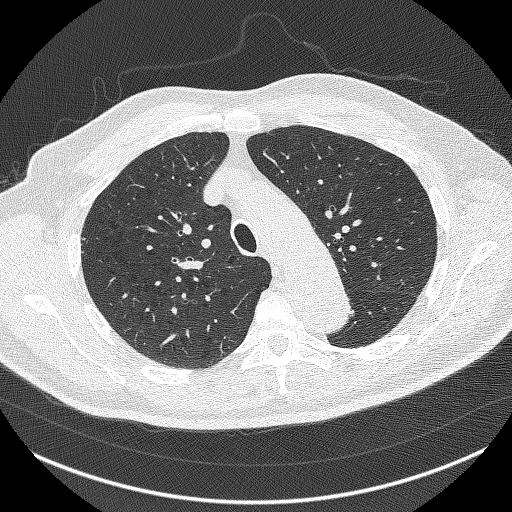
[im 250/324  lung]
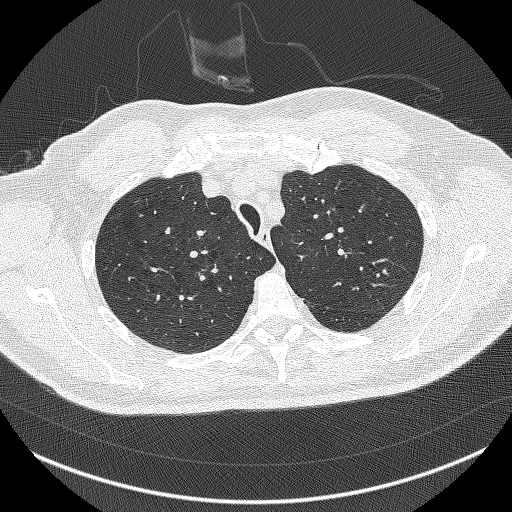
[im 279/324  lung]
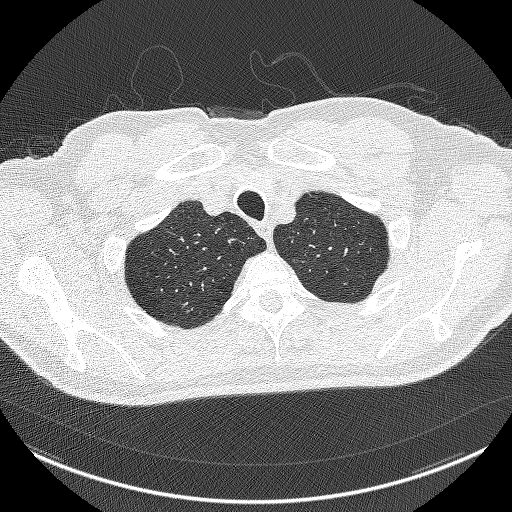
[im 309/324  lung]
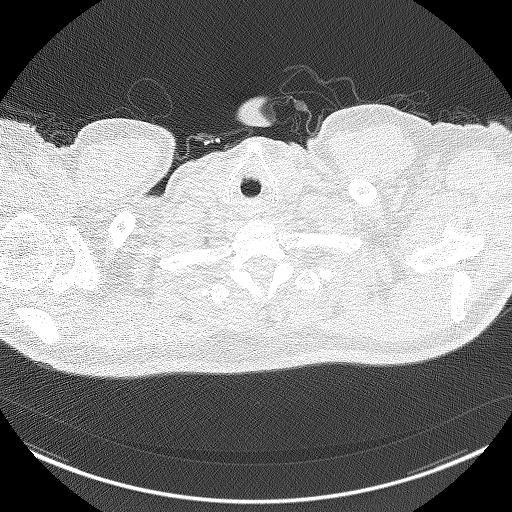

[Series 5: lung · coronal · 0.63mm/px · 3 of 281 slices shown (2 of 2)]
[im 57/281  lung]
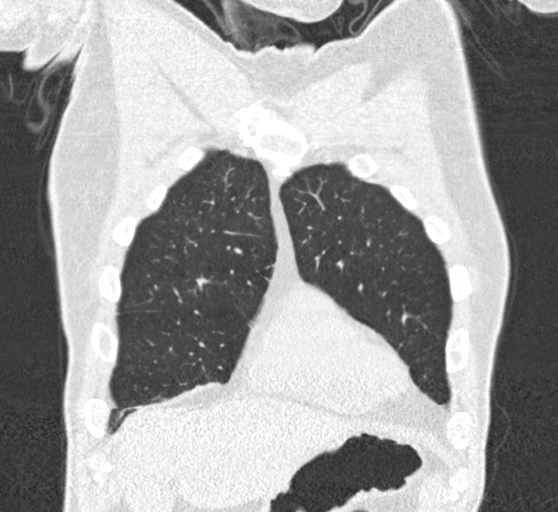
[im 113/281  lung]
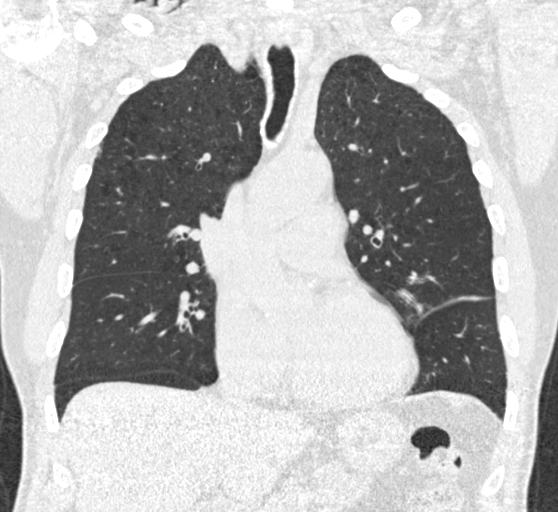
[im 169/281  lung]
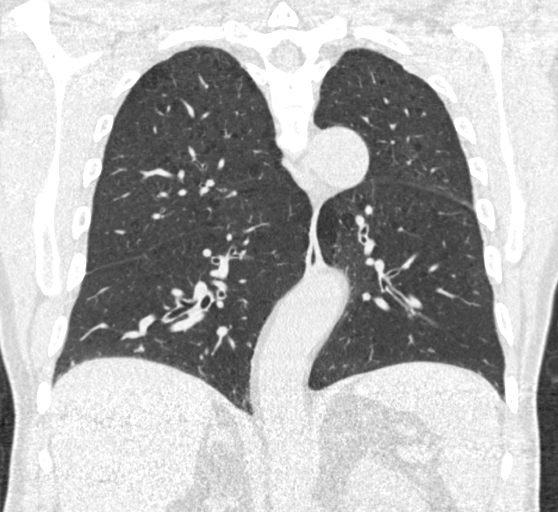

[15 of 40 positions shown; findings below may reference images not displayed]

FINDINGS: Cardiovascular: Atherosclerotic calcification of the arterial
vasculature, including coronary arteries and aortic valve. Ascending
aorta measures approximately 4.1 cm. Pulmonary arteries are
enlarged. Heart size normal. No pericardial effusion.

Mediastinum/Nodes: No pathologically enlarged mediastinal or
axillary lymph nodes. Hilar regions are difficult to evaluate
without IV contrast. Esophagus is grossly unremarkable.

Lungs/Pleura: Moderate centrilobular emphysema. A 9 mm nodule is
seen within a focal bed of emphysema in the anterior segment right
upper lobe (CT image 123). A minimally cavitary nodule in the left
lower lobe (CT image 208) measures 15.1 cm. No pleural fluid. Debris
is seen in bronchus intermedius and left mainstem bronchus.

Upper Abdomen: Visualized portions of the liver and adrenal glands
are unremarkable. Right kidney is atrophic. Visualized portions of
the left kidney, spleen, pancreas, stomach and bowel are grossly
unremarkable.

Musculoskeletal: Degenerative changes in the spine. No worrisome
lytic or sclerotic lesions.
IMPRESSION: 1. Right upper and left lower lobe nodules, worrisome for
synchronous bronchogenic carcinomas. Lung-RADS 4B, suspicious.
Additional imaging evaluation or consultation with Pulmonology or
Thoracic Surgery recommended. These results will be called to the
ordering clinician or representative by the Radiologist Assistant,
and communication documented in the PACS or zVision Dashboard.
2. Aortic atherosclerosis ([FU]-170.0). Coronary artery
calcification.
3. Ascending aortic aneurysm NOS ([FU]-[FU]). Recommend annual
imaging followup by CTA or MRA. This recommendation follows [FU]
ACCF/AHA/AATS/ACR/ASA/SCA/JACQUELINE MARISOL/JACQUELINE MARISOL/JACQUELINE MARISOL/JACQUELINE MARISOL Guidelines for the
Diagnosis and Management of Patients with Thoracic Aortic Disease.
Circulation. [FU]; 121: [id number].
4.  Emphysema ([FU]-[FU]).

## 2017-07-21 NOTE — Progress Notes (Signed)
In accordance with CMS guidelines, patient has met eligibility criteria including age, absence of signs or symptoms of lung cancer.  Social History   Tobacco Use  . Smoking status: Current Every Day Smoker    Packs/day: 0.60    Years: 53.00    Pack years: 31.80    Types: Cigarettes  . Smokeless tobacco: Never Used  Substance Use Topics  . Alcohol use: No    Alcohol/week: 0.0 oz  . Drug use: No     A shared decision-making session was conducted prior to the performance of CT scan. This includes one or more decision aids, includes benefits and harms of screening, follow-up diagnostic testing, over-diagnosis, false positive rate, and total radiation exposure.  Counseling on the importance of adherence to annual lung cancer LDCT screening, impact of co-morbidities, and ability or willingness to undergo diagnosis and treatment is imperative for compliance of the program.  Counseling on the importance of continued smoking cessation for former smokers; the importance of smoking cessation for current smokers, and information about tobacco cessation interventions have been given to patient including [place] and 1800 quit [place] programs.  Written order for lung cancer screening with LDCT has been given to the patient and any and all questions have been answered to the best of my abilities.   Yearly follow up will be coordinated by Burgess Estelle, Thoracic Navigator.  Faythe Casa, NP 07/21/2017 2:26 PM

## 2017-07-23 ENCOUNTER — Encounter: Payer: Self-pay | Admitting: *Deleted

## 2017-07-23 ENCOUNTER — Telehealth: Payer: Self-pay | Admitting: Oncology

## 2017-07-23 ENCOUNTER — Telehealth: Payer: Self-pay | Admitting: *Deleted

## 2017-07-23 DIAGNOSIS — R918 Other nonspecific abnormal finding of lung field: Secondary | ICD-10-CM

## 2017-07-23 NOTE — Progress Notes (Signed)
  Oncology Nurse Navigator Documentation  Navigator Location: [facility] (07/23/17 1500) Referral date to RadOnc/MedOnc: 07/23/17 (07/23/17 1500) )Navigator Encounter Type: Other (07/23/17 1500)   Abnormal Finding Date: 07/21/17 (07/23/17 1500)                     Barriers/Navigation Needs: Coordination of Care (07/23/17 1500)   Interventions: Coordination of Care (07/23/17 1500)   Coordination of Care: Appts;Radiology (07/23/17 1500)        Acuity: Level 2 (07/23/17 1500)   Acuity Level 2: Initial guidance, education and coordination as needed;Educational needs;Assistance expediting appointments (07/23/17 1500)  referral received from [facility] for lung nodules seen on low-dose CT Scan. Orders entered for PET and PFT's. Pt to be scheduled for consult on Friday 07/31/17 with Dr. Tasia Catchings in the lung multidisciplinary clinic. Pt will be contacted once all appts scheduled.    Time Spent with Patient: 30 (07/23/17 1500)

## 2017-07-23 NOTE — Telephone Encounter (Signed)
Reviewed lung screening results with Interventional radiology today in multidisciplinary thoracic conference with recommendation for PET scan as next step for patient's abnormal lung rads 4B findings.   Contacted wife as patient was unavailable, reviewed results and plan for PET, PFT's and to see MD next week for results. Wife is agreeable to plan and is expecting call with specific appointments tomorrow.   Wife will have patient call me for any questions or concerns.

## 2017-07-23 NOTE — Telephone Encounter (Signed)
Patient is a 72 year old male who was seen in the lung cancer screening program for personal history of tobacco use. Pt currently smokes with 31.8 pack year history and is asymptomatic. Low-dose CT scan was performed on 07/21/2017 as a part of the lung cancer screening program. CT scan revealed right upper and left lower lobe nodules, worrisome for synchronous bronchogenic carcinomas.   Patient has been referred to the cancer center and will require PET scan for further evaluation. PET scan is also needed to assist in further diagnostic studies including possible biopsy.   Faythe Casa, NP 07/23/2017 4:21 PM

## 2017-07-24 ENCOUNTER — Telehealth: Payer: Self-pay | Admitting: Oncology

## 2017-07-24 NOTE — Telephone Encounter (Signed)
New patient pkt mailed to patient, per Hayley/schd msg.

## 2017-07-28 ENCOUNTER — Ambulatory Visit: Payer: Medicare Other

## 2017-07-28 ENCOUNTER — Ambulatory Visit: Payer: Medicare Other | Attending: Oncology

## 2017-07-28 ENCOUNTER — Telehealth: Payer: Self-pay | Admitting: *Deleted

## 2017-07-28 DIAGNOSIS — R918 Other nonspecific abnormal finding of lung field: Secondary | ICD-10-CM | POA: Diagnosis not present

## 2017-07-28 LAB — BLOOD GAS, ARTERIAL
ACID-BASE DEFICIT: 1 mmol/L (ref 0.0–2.0)
BICARBONATE: 23.5 mmol/L (ref 20.0–28.0)
FIO2: 0.21
O2 SAT: 96.8 %
PCO2 ART: 38 mmHg (ref 32.0–48.0)
Patient temperature: 37
pH, Arterial: 7.4 (ref 7.350–7.450)
pO2, Arterial: 89 mmHg (ref 83.0–108.0)

## 2017-07-28 MED ORDER — ALBUTEROL SULFATE (2.5 MG/3ML) 0.083% IN NEBU
2.5000 mg | INHALATION_SOLUTION | Freq: Once | RESPIRATORY_TRACT | Status: AC
  Administered 2017-07-28: 2.5 mg via RESPIRATORY_TRACT
  Filled 2017-07-28: qty 3

## 2017-07-28 NOTE — Telephone Encounter (Signed)
Phone call made to patient with no answer. Voicemail message left with patient to inform him that PET has been authorized with insurance and has been rescheduled to tomorrow morning at 8:30am. Instructed pt to arrive at 8am at the medical mall to register and to remain NPO 6 hours before scan. Informed that needs to hold morning dose on diabetic medication and may resume after scan. Instructed pt to call back to confirm appt. Awaiting call back.

## 2017-07-28 NOTE — Telephone Encounter (Signed)
Spoke with pt's spouse Deneise Lever and confirmed appt for PET scan tomorrow morning. All questions answered. States they have instructions for prep.

## 2017-07-29 ENCOUNTER — Encounter
Admission: RE | Admit: 2017-07-29 | Discharge: 2017-07-29 | Disposition: A | Discharge status: Home / Self Care | Payer: Medicare Other | Source: Ambulatory Visit | Attending: Oncology | Admitting: Oncology

## 2017-07-29 DIAGNOSIS — R911 Solitary pulmonary nodule: Secondary | ICD-10-CM | POA: Diagnosis not present

## 2017-07-29 DIAGNOSIS — R918 Other nonspecific abnormal finding of lung field: Secondary | ICD-10-CM | POA: Insufficient documentation

## 2017-07-29 LAB — GLUCOSE, CAPILLARY: GLUCOSE-CAPILLARY: 94 mg/dL (ref 65–99)

## 2017-07-29 IMAGING — CT NM PET TUM IMG INITIAL (PI) SKULL BASE T - THIGH
1 of 11 series · 1 of 25 positions shown · non-contrast
Comparison: Multiple exams, including CT chest [DATE], CT
abdomen [DATE], and CT neck from [DATE]

CLINICAL DATA: Initial treatment strategy for lung nodule. History
of throat cancer 7 years ago, with radiation therapy..

EXAM:
NUCLEAR MEDICINE PET SKULL BASE TO THIGH
TECHNIQUE: 9.4 mCi F-18 FDG was injected intravenously. Full-ring PET imaging
was performed from the skull base to thigh after the radiotracer. CT
data was obtained and used for attenuation correction and anatomic
localization.
Fasting blood glucose: 94 mg/dl

[Series 3: ct wb 5.0 b30f · axial · 5.0mm · 0.98mm/px · 1 of 329 slices shown]
[im 329/329  brain]
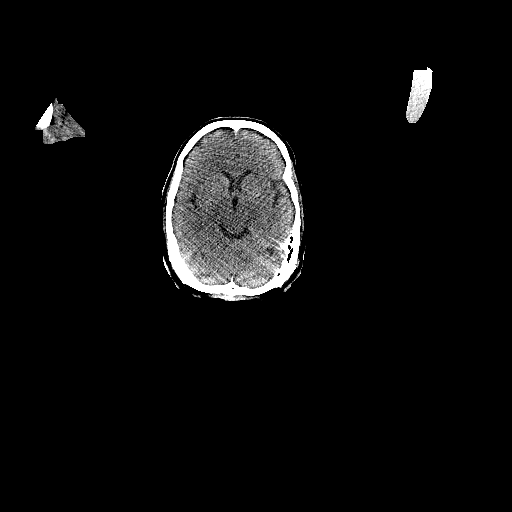

[1 of 25 positions shown; findings below may reference images not displayed]

FINDINGS: Mediastinal blood pool activity: SUV max

NECK: No appreciable hypermetabolic activity is identified in the
neck.

Incidental CT findings: Small mucous retention cyst in the right
maxillary sinus.

CHEST: [DATE] by 0.7 cm right upper lobe nodule with possible adjacent
interstitial accentuation along its anterior margin on image 91/3,
no change in size, maximum SUV 4.5.

5 mm peribronchovascular nodule in the right upper lobe on image
86/3, maximum SUV 1.6.

1.5 by 1.5 cm left lower lobe nodule on image 115/3, maximum SUV

No hypermetabolic adenopathy in the chest.

Incidental CT findings: Atherosclerosis of the coronary arteries and
aortic arch. Centrilobular emphysema.

ABDOMEN/PELVIS: Accentuated activity in bowel is fairly widespread
and likely physiologic. There is some speckled activity in the liver
but no definite hypermetabolic liver lesion.

Incidental CT findings: Cholecystectomy. Severely atrophic right
kidney. Prominent stool throughout the colon favors constipation.
Descending and proximal sigmoid colon diverticulosis.
Prostatomegaly. Fatty spermatic cords.

SKELETON: No abnormal skeletal hypermetabolic activity.

Incidental CT findings: none
IMPRESSION: 1. The 1.5 cm left lower lobe nodule and the 1.0 by 0.7 cm right
upper lobe solid nodule are both hypermetabolic and suspicious for
malignancy. In addition, there a 5 mm peribronchovascular nodule in
the right upper lobe with an SUV which, although only 1.6, is
disproportionate to the small size of the lesion and accordingly
concerning for malignancy. No abnormal hypermetabolic activity in
the neck, abdomen/pelvis, or skeleton is identified. No
hypermetabolic adenopathy in the chest.
2. Other imaging findings of potential clinical significance: Small
mucous retention cyst in the right maxillary sinus. Aortic
Atherosclerosis ([CN]-[CN]) and Emphysema ([CN]-[CN]). Coronary
atherosclerosis. Prostatomegaly. Prominent stool throughout the
colon favors constipation. Descending and proximal sigmoid colon
diverticulosis.

## 2017-07-29 MED ORDER — FLUDEOXYGLUCOSE F - 18 (FDG) INJECTION
9.4000 | Freq: Once | INTRAVENOUS | Status: AC | PRN
  Administered 2017-07-29: 9.4 via INTRAVENOUS

## 2017-07-29 NOTE — Addendum Note (Signed)
Addended by: Telford Nab on: 07/29/2017 08:34 AM   Modules accepted: Orders

## 2017-07-30 NOTE — Progress Notes (Signed)
[facility] Consultation      Assessment and Plan:  Lung nodule.  -Bilateral lung nodules, appears cavitary.  Largest nodule was left lower lobe 14 mm with elevated SUV of 5.4. - We will send for ACE, Anka, TB QuantiFERON. -We will have Dr. Mortimer Fries review films to see if this is amenable to ENB bronchoscopy, if not the patient will be referred for needle biopsy CT guidance.  Nicotine abuse.  -Discussed importance of smoking cessation, patient appears to be committed, and wants to try to quit.  Spent greater than 3 minutes in discussion.  COPD.  - COPD, with scattered bilateral wheezing.  Review of CBC absolute eosinophil counts appear elevated suggesting an inflammatory component. -Continue Spiriva, will add Brio.  If too expensive can change to generic Advair.  Date: 07/31/2017  MRN# [id number] KIEFER OPHEIM [birth year]    Delena Bali Hickson is a 72 y.o. old male seen in consultation for chief complaint of:    Chief Complaint  Patient presents with  . Consult    Referred by Dr. Tasia Catchings for eval of lung nodule.  Marland Kitchen Shortness of Breath    with exertion  . Cough    mostly in am  . Wheezing    HPI:   The patient was initally noted to have a LLL nodule on CXR on 07/17/17, he then underwent a  CT scan on 4/2 which showed emphysematous changes and RUL and LLL nodules both of which had a cavitary component. PET scan was then performed on 4/10 which showed elevated SUV on both, greater in the LLL nodule.  He has a history of COPD and takes spiriva once daily. No known history of CAD, takes  Baby aspirin daily.  He is smoking half ppd, he quit previously when he had throat cancer but then restarted. He thinks he is ready to quit and does not think he requires chantix or patches.   Imaging personally reviewed, CT low dose 07/21/17, and PET 07/29/17. There is a 14 mm nodule in the LLL SUV 5.2 and 62mm nodule in RUL SUV 4.5. Minimal lymphadenopathy.   **CBC 07/17/17; abs eos  count=700.    PMHX:   Past Medical History:  Diagnosis Date  . Cancer (HCC)    laryngeal  . Chronic kidney disease   . COPD (chronic obstructive pulmonary disease) ([place])   . Diabetes mellitus without complication ([place])   . Hyperlipidemia   . Hypertension   . Lumbago   . Tobacco abuse disorder    Surgical Hx:  Past Surgical History:  Procedure Laterality Date  . CHOLECYSTECTOMY    . THROAT SURGERY     Family Hx:  Family History  Problem Relation Age of Onset  . Heart disease Father   . Diabetes Sister   . Stroke Sister   . Cancer Brother        lung  . Cancer Brother        lung  . Cancer Brother        throat  . Diabetes Sister   . Cancer Brother        lung   Social Hx:   Social History   Tobacco Use  . Smoking status: Current Every Day Smoker    Packs/day: 0.60    Years: 53.00    Pack years: 31.80    Types: Cigarettes  . Smokeless tobacco: Never Used  Substance Use Topics  . Alcohol use: No    Alcohol/week: 0.0 oz  .  Drug use: No   Medication:    Current Outpatient Medications:  .  allopurinol (ZYLOPRIM) 100 MG tablet, TAKE 1 TABLET BY MOUTH ONCE DAILY, Disp: 90 tablet, Rfl: 0 .  aspirin 81 MG tablet, Take 81 mg by mouth daily., Disp: , Rfl:  .  atorvastatin (LIPITOR) 40 MG tablet, Take 1 tablet (40 mg total) by mouth daily., Disp: 90 tablet, Rfl: 3 .  metFORMIN (GLUCOPHAGE) 500 MG tablet, Take 1 tablet (500 mg total) by mouth 2 (two) times daily with a meal., Disp: 180 tablet, Rfl: 1 .  metoprolol tartrate (LOPRESSOR) 25 MG tablet, Take 1 tablet (25 mg total) by mouth 2 (two) times daily., Disp: 180 tablet, Rfl: 1 .  ramipril (ALTACE) 10 MG capsule, TAKE 1 CAPSULE BY MOUTH ONCE DAILY, Disp: 90 capsule, Rfl: 3 .  SPIRIVA HANDIHALER 18 MCG inhalation capsule, PLACE 1 CAPSULE INTO INHALER AND INHALE DAILY, Disp: 90 capsule, Rfl: 1   Allergies:  Patient has no known allergies.  Review of Systems: Gen:  Denies  fever, sweats, chills HEENT: Denies  blurred vision, double vision. bleeds, sore throat Cvc:  No dizziness, chest pain. Resp:   Denies cough or sputum production, shortness of breath Gi: Denies swallowing difficulty, stomach pain. Gu:  Denies bladder incontinence, burning urine Ext:   No Joint pain, stiffness. Skin: No skin rash,  hives  Endoc:  No polyuria, polydipsia. Psych: No depression, insomnia. Other:  All other systems were reviewed with the patient and were negative other that what is mentioned in the HPI.   Physical Examination:   VS: BP 126/70 (BP Location: Left Arm, Cuff Size: Normal)   Pulse 62   Resp 16   Ht 5\' 8"  (1.727 m)   Wt 171 lb (77.6 kg)   SpO2 96%   BMI 26.00 kg/m   General Appearance: No distress  Neuro:without focal findings,  speech normal,  HEENT: PERRLA, EOM intact.   Pulmonary: normal breath sounds, scattered bilateral wheezing.  CardiovascularNormal S1,S2.  No m/r/g.   Abdomen: Benign, Soft, non-tender. Renal:  No costovertebral tenderness  GU:  No performed at this time. Endoc: No evident thyromegaly, no signs of acromegaly. Skin:   warm, no rashes, no ecchymosis  Extremities: normal, no cyanosis, clubbing.  Other findings:    LABORATORY PANEL:   CBC No results for input(s): WBC, HGB, HCT, PLT in the last 168 hours. ------------------------------------------------------------------------------------------------------------------  Chemistries  No results for input(s): NA, K, CL, CO2, GLUCOSE, BUN, CREATININE, CALCIUM, MG, AST, ALT, ALKPHOS, BILITOT in the last 168 hours.  Invalid input(s): GFRCGP ------------------------------------------------------------------------------------------------------------------  Cardiac Enzymes No results for input(s): TROPONINI in the last 168 hours. ------------------------------------------------------------  RADIOLOGY:  No results found.     Thank  you for the consultation and for allowing [facility] to  assist in the care of your patient. Our recommendations are noted above.  Please contact us if we can be of further service.   Marda Stalker, MD.  Board Certified in Internal Medicine, Pulmonary Medicine, [place], and Sleep Medicine.  [facility] Office Number: [phone]  Patricia Pesa, M.D.  Merton Border, M.D  07/31/2017

## 2017-07-31 ENCOUNTER — Encounter: Payer: Self-pay | Admitting: Internal Medicine

## 2017-07-31 ENCOUNTER — Encounter: Payer: Self-pay | Admitting: Oncology

## 2017-07-31 ENCOUNTER — Encounter: Payer: Self-pay | Admitting: *Deleted

## 2017-07-31 ENCOUNTER — Other Ambulatory Visit
Admission: RE | Admit: 2017-07-31 | Discharge: 2017-07-31 | Disposition: A | Discharge status: Home / Self Care | Payer: Medicare Other | Source: Ambulatory Visit | Attending: Internal Medicine | Admitting: Internal Medicine

## 2017-07-31 ENCOUNTER — Other Ambulatory Visit: Payer: Self-pay

## 2017-07-31 ENCOUNTER — Inpatient Hospital Stay (HOSPITAL_BASED_OUTPATIENT_CLINIC_OR_DEPARTMENT_OTHER): Payer: Medicare Other | Admitting: Oncology

## 2017-07-31 ENCOUNTER — Ambulatory Visit (INDEPENDENT_AMBULATORY_CARE_PROVIDER_SITE_OTHER): Payer: Medicare Other | Admitting: Internal Medicine

## 2017-07-31 VITALS — BP 126/70 | HR 62 | Resp 16 | Ht 68.0 in | Wt 171.0 lb

## 2017-07-31 VITALS — BP 132/87 | HR 54 | Temp 95.6°F | Ht 70.0 in | Wt 171.2 lb

## 2017-07-31 DIAGNOSIS — C329 Malignant neoplasm of larynx, unspecified: Secondary | ICD-10-CM

## 2017-07-31 DIAGNOSIS — Z8521 Personal history of malignant neoplasm of larynx: Secondary | ICD-10-CM

## 2017-07-31 DIAGNOSIS — R918 Other nonspecific abnormal finding of lung field: Secondary | ICD-10-CM

## 2017-07-31 DIAGNOSIS — R911 Solitary pulmonary nodule: Secondary | ICD-10-CM | POA: Insufficient documentation

## 2017-07-31 DIAGNOSIS — J449 Chronic obstructive pulmonary disease, unspecified: Secondary | ICD-10-CM | POA: Diagnosis not present

## 2017-07-31 DIAGNOSIS — F17210 Nicotine dependence, cigarettes, uncomplicated: Secondary | ICD-10-CM | POA: Diagnosis not present

## 2017-07-31 DIAGNOSIS — Z720 Tobacco use: Secondary | ICD-10-CM

## 2017-07-31 MED ORDER — FLUTICASONE FUROATE-VILANTEROL 100-25 MCG/INH IN AEPB: 1.0000 | INHALATION_SPRAY | Freq: Every day | RESPIRATORY_TRACT | 0 refills | Status: AC

## 2017-07-31 MED ORDER — FLUTICASONE FUROATE-VILANTEROL 200-25 MCG/INH IN AEPB: 1.0000 | INHALATION_SPRAY | Freq: Every day | RESPIRATORY_TRACT | 2 refills | Status: DC

## 2017-07-31 MED ORDER — FLUTICASONE FUROATE-VILANTEROL 100-25 MCG/INH IN AEPB: 1.0000 | INHALATION_SPRAY | Freq: Every day | RESPIRATORY_TRACT | 5 refills | Status: DC

## 2017-07-31 NOTE — Progress Notes (Signed)
  Oncology Nurse Navigator Documentation  Navigator Location: [facility]Med Onc (07/31/17 1200)   )Navigator Encounter Type: Initial MedOnc (07/31/17 1200)                       Treatment Phase: Abnormal Scans (07/31/17 1200) Barriers/Navigation Needs: Coordination of Care (07/31/17 1200)   Interventions: Coordination of Care (07/31/17 1200)   Coordination of Care: Appts;Broncoscopy (07/31/17 1200)       met with patient during initial consultation with Dr. Tasia Catchings to review scans and discuss next steps. All questions answered at the time of visit. Pt and wife given contact info and instructed to call with any further questions or needs. Pt escorted to [facility] Pulmonary clinic to see Dr. Ashby Dawes for bronchoscopy evaluation. Informed pt that once is scheduled for biopsy then will get him scheduled to follow up with Dr. Tasia Catchings a few days after biopsy to review results and discuss treatment options. Pt and wife verbalized understanding. Nothing further needed at this time.           Time Spent with Patient: 60 (07/31/17 1200)

## 2017-07-31 NOTE — Progress Notes (Signed)
Hematology/Oncology Consult note [facility] Telephone:([phone] [phone]   Patient Care Team: Kathrine Haddock, NP as PCP - General (Nurse Practitioner) Beverly Gust, MD (Otolaryngology) Telford Nab, RN as Registered Nurse  REFERRING PROVIDER: Kathrine Haddock, NP CHIEF COMPLAINTS/PURPOSE OF CONSULTATION:  Evaluation of lung nodule  HISTORY OF PRESENTING ILLNESS:  Philip Wood is a  72 y.o.  male with PMH listed below who was referred to me for evaluation of lung nodule.  Patient follows up with primary care physician and reports significant unintentional weight loss for the past couple of months.   he has history of 31 pack a day smoking history, current smoker so he was referred to lung cancer screening clinic and had a CT chest scan done.  Image Study  CT chest wo 07/21/2017  1. Right upper and left lower lobe nodules, worrisome for synchronous bronchogenic carcinomas. Lung-RADS 4B, suspicious. Additional imaging evaluation or consultation with Pulmonology or Thoracic Surgery recommended. These results will be called to the ordering clinician or representative by the Radiologist Assistant, and communication documented in the PACS or zVision Dashboard. 2. Aortic atherosclerosis (ICD10-170.0). Coronary artery calcification. 3. Ascending aortic aneurysm NOS (ICD10-I71.9). Recommend annual imaging followup by CTA or MRA. This recommendation follows 2010 ACCF/AHA/AATS/ACR/ASA/SCA/SCAI/SIR/STS/SVM Guidelines for the Diagnosis and Management of Patients with Thoracic Aortic Disease. Circulation. 2010; 121: Q947-M546. 4.  Emphysema (ICD10-J43.9).  PET scan 07/29/2017  1. The 1.5 cm left lower lobe nodule and the 1.0 by 0.7 cm right upper lobe solid nodule are both hypermetabolic and suspicious for malignancy. In addition, there a 5 mm peribronchovascular nodule in the right upper lobe with an SUV which, although only 1.6, is disproportionate to the  small size of the lesion and accordingly concerning for malignancy. No abnormal hypermetabolic activity in the neck, abdomen/pelvis, or skeleton is identified. No hypermetabolic adenopathy in the chest. 2. Other imaging findings of potential clinical significance: Small mucous retention cyst in the right maxillary sinus. Aortic Atherosclerosis (ICD10-I70.0) and Emphysema (ICD10-J43.9). Coronary atherosclerosis. Prostatomegaly. Prominent stool throughout the colon favors constipation. Descending and proximal sigmoid colon diverticulosis.  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  He got definitive radiation to the larynx, and the patient has been doing annual checkup with ENT Dr. Tami Ribas.  Patient's case was discussed on tumor conference on 07/30/2017 and consensus recommendation is to proceed with ENB biopsy of both lung nodule.  Patient present to clinic today accompanied by his wife.  He denies any shortness of breath, chest pain, abdominal pain, hemoptysis, headache   Or double vision.  He continues to smoke half a pack of cigarettes a day  Review of Systems  Constitutional: Positive for weight loss. Negative for chills, fever and malaise/fatigue.  HENT: Negative for nosebleeds.   Eyes: Negative for photophobia and pain.  Respiratory: Positive for cough. Negative for hemoptysis, sputum production and shortness of breath.   Cardiovascular: Negative for chest pain, orthopnea and claudication.  Gastrointestinal: Negative for nausea and vomiting.  Genitourinary: Negative for dysuria and urgency.  Musculoskeletal: Negative for myalgias and neck pain.  Skin: Negative for rash.  Neurological: Negative for dizziness, speech change, focal weakness and headaches.  Endo/Heme/Allergies: Does not  bruise/bleed easily.  Psychiatric/Behavioral: Negative for depression and hallucinations.    MEDICAL HISTORY:  Past Medical History:  Diagnosis Date  . Cancer (HCC)    laryngeal  . Chronic kidney disease   . COPD (chronic obstructive pulmonary disease) ([place])   . Diabetes mellitus without complication ([place])   . Hyperlipidemia   . Hypertension   . Lumbago   . Tobacco abuse disorder     SURGICAL HISTORY: Past Surgical History:  Procedure Laterality Date  . CHOLECYSTECTOMY    . THROAT SURGERY      SOCIAL HISTORY: Social History   Socioeconomic History  . Marital status: Married    Spouse name: Not on file  . Number of children: Not on file  . Years of education: Not on file  . Highest education level: Not on file  Occupational History  . Not on file  Social Needs  . Financial resource strain: Not hard at all  . Food insecurity:    Worry: Never true    Inability: Never true  . Transportation needs:    Medical: No    Non-medical: No  Tobacco Use  . Smoking status: Current Every Day Smoker    Packs/day: 0.60    Years: 53.00    Pack years: 31.80    Types: Cigarettes  . Smokeless tobacco: Never Used  Substance and Sexual Activity  . Alcohol use: No    Alcohol/week: 0.0 oz  . Drug use: No  . Sexual activity: Not Currently  Lifestyle  . Physical activity:    Days per week: 0 days    Minutes per session: 0 min  . Stress: Not at all  Relationships  . Social connections:    Talks on phone: Twice a week    Gets together: Once a week    Attends religious service: More than 4 times per year    Active member of club or organization: No    Attends meetings of clubs or organizations: Never    Relationship status: Married  . Intimate partner violence:    Fear of current or ex partner: No    Emotionally abused: No    Physically abused: No    Forced sexual activity: No  Other Topics Concern  . Not on file  Social History Narrative   Works part time at [employer]     FAMILY HISTORY: Family History  Problem Relation Age of Onset  . Heart disease Father   . Diabetes Sister   . Stroke Sister   . Cancer Brother        lung  . Cancer Brother        lung  . Cancer Brother        throat  . Diabetes Sister   . Cancer Brother        lung    ALLERGIES:  has No Known Allergies.  MEDICATIONS:  Current Outpatient Medications  Medication Sig Dispense Refill  . allopurinol (ZYLOPRIM) 100 MG tablet TAKE 1 TABLET BY MOUTH ONCE DAILY 90 tablet 0  . aspirin 81 MG tablet Take 81 mg by mouth daily.    Marland Kitchen atorvastatin (LIPITOR) 40 MG tablet Take 1 tablet (40 mg total) by mouth daily. 90 tablet 3  . metFORMIN (GLUCOPHAGE) 500 MG tablet Take 1 tablet (500 mg total) by mouth 2 (two) times daily with a meal. 180 tablet 1  . metoprolol tartrate (LOPRESSOR) 25 MG tablet Take 1 tablet (25 mg total) by mouth 2 (two) times daily. 180 tablet 1  . ramipril (ALTACE) 10 MG capsule TAKE 1 CAPSULE BY MOUTH ONCE DAILY 90 capsule 3  .  SPIRIVA HANDIHALER 18 MCG inhalation capsule PLACE 1 CAPSULE INTO INHALER AND INHALE DAILY 90 capsule 1   No current facility-administered medications for this visit.      PHYSICAL EXAMINATION: ECOG PERFORMANCE STATUS: 1 - Symptomatic but completely ambulatory Vitals:   07/31/17 1313  BP: 132/87  Pulse: (!) 54  Temp: (!) 95.6 F (35.3 C)   Filed Weights   07/31/17 1313  Weight: 171 lb 3 oz (77.7 kg)    Physical Exam  Constitutional: He is oriented to person, place, and time. He appears well-developed and well-nourished. No distress.  HENT:  Head: Normocephalic and atraumatic.  Mouth/Throat: No oropharyngeal exudate.  Eyes: Pupils are equal, round, and reactive to light. Conjunctivae and EOM are normal.  Neck: Normal range of motion. Neck supple.  Cardiovascular: Normal rate, regular rhythm and normal heart sounds.  No murmur heard. Pulmonary/Chest: Effort normal and breath sounds normal. No respiratory distress.  Abdominal:  Soft. Bowel sounds are normal. There is no tenderness. There is no guarding.  Musculoskeletal: Normal range of motion. He exhibits no edema.  Neurological: He is alert and oriented to person, place, and time. No cranial nerve deficit.  Skin: Skin is warm and dry.  Psychiatric: He has a normal mood and affect.     LABORATORY DATA:  I have reviewed the data as listed Lab Results  Component Value Date   WBC 6.1 07/17/2017   HGB 15.1 07/17/2017   HCT 44.0 07/17/2017   MCV 92 07/17/2017   PLT 166 07/17/2017   Recent Labs    01/12/17 1020 07/17/17 1131  NA 140 142  K 5.3* 5.3*  CL 101 103  CO2 25 27  GLUCOSE 96 96  BUN 15 14  CREATININE 1.18 1.04  CALCIUM 9.1 9.2  GFRNONAA 62 72  GFRAA 71 83  PROT 5.8* 5.8*  ALBUMIN 4.0 3.8  AST 13 9  ALT 9 8  ALKPHOS 92 90  BILITOT 0.4 0.5       ASSESSMENT & PLAN:  1. Lung nodules   2. Tobacco abuse   3. Squamous cell carcinoma of larynx ([place])    I had a lengthy discussion with patient and his wife regarding patient's image scan results and consensus recommendation from tumor board. Given that both lung nodules are hypermetabolic on PET scan, suspicion of primary lung cancer is high. The 2 nodules can be synchronous primary lung cancer, or metastasis.  The other possibility will be metastasis from previous larynx squamous carcinoma. I advised patient to establish care with pulmonologist.  Patient has an appointment with Dr. Felicie Morn this afternoon and will be scheduled to have ENB biopsy of both nodules. Patient can return to discuss about pathology results in the future management plan.  All questions were answered. The patient knows to call the clinic with any problems questions or concerns.  Return of visit: To be determined.  Lung cancer clinic nurse navigator Hildred Alamin will coordinate. Thank you for this kind referral and the opportunity to participate in the care of this patient. A copy of today's note is routed to referring  provider    Earlie Server, MD, PhD Hematology Oncology [facility] Pager- [phone] 07/31/2017

## 2017-07-31 NOTE — Patient Instructions (Addendum)
Will have Dr. Mortimer Fries review your CT scan to see if the nodule can be diagnosed with bronchoscopy, if not will refer you for needle biopsy.   Will start Breo once daily, rinse mouth after use. If the prescription is too expensive let us know and we will change it.

## 2017-08-01 LAB — ANGIOTENSIN CONVERTING ENZYME: Angiotensin-Converting Enzyme: <15 U/L (ref 14–82)

## 2017-08-03 ENCOUNTER — Telehealth: Payer: Self-pay | Admitting: Internal Medicine

## 2017-08-03 ENCOUNTER — Other Ambulatory Visit
Admission: RE | Admit: 2017-08-03 | Discharge: 2017-08-03 | Disposition: A | Discharge status: Home / Self Care | Payer: Medicare Other | Source: Ambulatory Visit | Attending: Internal Medicine | Admitting: Internal Medicine

## 2017-08-03 DIAGNOSIS — R911 Solitary pulmonary nodule: Secondary | ICD-10-CM | POA: Diagnosis not present

## 2017-08-03 NOTE — Telephone Encounter (Signed)
Patient wife calling back about a voicemail directing patient to have an additional lab test done.  Please call to discuss .

## 2017-08-03 NOTE — Telephone Encounter (Signed)
Pt aware.ss

## 2017-08-04 ENCOUNTER — Telehealth: Payer: Self-pay | Admitting: *Deleted

## 2017-08-04 DIAGNOSIS — E119 Type 2 diabetes mellitus without complications: Secondary | ICD-10-CM | POA: Diagnosis not present

## 2017-08-04 LAB — HM DIABETES EYE EXAM

## 2017-08-04 NOTE — Telephone Encounter (Signed)
Pt spouse notified. 

## 2017-08-04 NOTE — Telephone Encounter (Signed)
ENB scheduled for 08/19/17 patient needs to report at 12noon medical mall. Pre-assessment is 08/13/17 in the office at 12:45. LMCTB

## 2017-08-04 NOTE — Telephone Encounter (Signed)
Pt spouse calling us back   Please call

## 2017-08-05 LAB — ANCA TITERS: Atypical P-ANCA titer: <1:20 {titer}

## 2017-08-06 LAB — QUANTIFERON-TB GOLD PLUS (RQFGPL)
QUANTIFERON NIL VALUE: 0.02 [IU]/mL
QUANTIFERON TB1 AG VALUE: 0.02 [IU]/mL
QUANTIFERON TB2 AG VALUE: 0.02 [IU]/mL

## 2017-08-06 LAB — QUANTIFERON-TB GOLD PLUS: QUANTIFERON-TB GOLD PLUS: NEGATIVE

## 2017-08-07 ENCOUNTER — Telehealth: Payer: Self-pay | Admitting: Internal Medicine

## 2017-08-07 NOTE — Telephone Encounter (Signed)
Informed pt pt needs to continue his inhalers and the day of procedure PAT will inform him what to take. Nothing further needed.

## 2017-08-07 NOTE — Telephone Encounter (Signed)
Pt wife calling asking about patient upcoming procedure   She states she was advised to call us for she talked to Pre-Admit  And they did not know if patient was to stay on both inhalers until then  He is using Memory Dance and Reunion   Please call back

## 2017-08-13 ENCOUNTER — Encounter
Admission: RE | Admit: 2017-08-13 | Discharge: 2017-08-13 | Disposition: A | Discharge status: Home / Self Care | Payer: Medicare Other | Source: Ambulatory Visit | Attending: Internal Medicine | Admitting: Internal Medicine

## 2017-08-13 ENCOUNTER — Other Ambulatory Visit: Payer: Self-pay

## 2017-08-13 DIAGNOSIS — I10 Essential (primary) hypertension: Secondary | ICD-10-CM | POA: Insufficient documentation

## 2017-08-13 DIAGNOSIS — Z01810 Encounter for preprocedural cardiovascular examination: Secondary | ICD-10-CM | POA: Diagnosis not present

## 2017-08-13 DIAGNOSIS — Z01812 Encounter for preprocedural laboratory examination: Secondary | ICD-10-CM | POA: Insufficient documentation

## 2017-08-13 HISTORY — DX: Angina pectoris, unspecified: I20.9

## 2017-08-13 HISTORY — DX: Polyneuropathy, unspecified: G62.9

## 2017-08-13 HISTORY — DX: Gastro-esophageal reflux disease without esophagitis: K21.9

## 2017-08-13 LAB — POTASSIUM: Potassium: 4 mmol/L (ref 3.5–5.1)

## 2017-08-13 NOTE — Patient Instructions (Signed)
Your procedure is scheduled on: 08/19/17 Wed Report to Same Day Surgery 2nd floor medical mall [facility] Entrance-take elevator on left to 2nd floor.  Check in with surgery information desk.) To find out your arrival time please call [phone] between 1PM - 3PM on 08/18/17 Tues  Remember: Instructions that are not followed completely may result in serious medical risk, up to and including death, or upon the discretion of your surgeon and anesthesiologist your surgery may need to be rescheduled.    _x___ 1. Do not eat food after midnight the night before your procedure. You may drink clear liquids up to 2 hours before you are scheduled to arrive at the hospital for your procedure.  Do not drink clear liquids within 2 hours of your scheduled arrival to the hospital.  Clear liquids include  --Water or Apple juice without pulp  --Clear carbohydrate beverage such as ClearFast or Gatorade  --Black Coffee or Clear Tea (No milk, no creamers, do not add anything to                  the coffee or Tea Type 1 and type 2 diabetics should only drink water.  No gum chewing or hard candies.     __x__ 2. No Alcohol for 24 hours before or after surgery.   __x__3. No Smoking or e-cigarettes for 24 prior to surgery.  Do not use any chewable tobacco products for at least 6 hour prior to surgery   ____  4. Bring all medications with you on the day of surgery if instructed.    __x__ 5. Notify your doctor if there is any change in your medical condition     (cold, fever, infections).    x___6. On the morning of surgery brush your teeth with toothpaste and water.  You may rinse your mouth with mouth wash if you wish.  Do not swallow any toothpaste or mouthwash.   Do not wear jewelry, make-up, hairpins, clips or nail polish.  Do not wear lotions, powders, or perfumes. You may wear deodorant.  Do not shave 48 hours prior to surgery. Men may shave face and neck.  Do not bring valuables to the hospital.     [facility] is not responsible for any belongings or valuables.               Contacts, dentures or bridgework may not be worn into surgery.  Leave your suitcase in the car. After surgery it may be brought to your room.  For patients admitted to the hospital, discharge time is determined by your                       treatment team.  _  Patients discharged the day of surgery will not be allowed to drive home.  You will need someone to drive you home and stay with you the night of your procedure.    Please read over the following fact sheets that you were given:   [facility] Preparing for Surgery and or MRSA Information   _x___ Take anti-hypertensive listed below, cardiac, seizure, asthma,     anti-reflux and psychiatric medicines. These include:  1. fluticasone furoate-vilanterol (BREO ELLIPTA) 100-25 MCG/INH AEPB  2.metoprolol tartrate (LOPRESSOR) 25 MG tablet  3.SPIRIVA HANDIHALER 18 MCG inhalation capsule  4.  5.  6.  ____Fleets enema or Magnesium Citrate as directed.   ____ Use CHG Soap or sage wipes as directed on instruction sheet  ____ Use inhalers on the day of surgery and bring to hospital day of surgery  _x___ Stop Metformin and Janumet 2 days prior to surgery.    ____ Take 1/2 of usual insulin dose the night before surgery and none on the morning     surgery.   _x___ Follow recommendations from Cardiologist, Pulmonologist or PCP regarding          stopping Aspirin, Coumadin, Plavix ,Eliquis, Effient, or Pradaxa, and Pletal.  X____Stop Anti-inflammatories such as Advil, Aleve, Ibuprofen, Motrin, Naproxen, Naprosyn, Goodies powders or aspirin products. OK to take Tylenol and                          Celebrex.   _x___ Stop supplements until after surgery.  But may continue Vitamin D, Vitamin B,       and multivitamin.   ____ Bring C-Pap to the hospital.

## 2017-08-14 ENCOUNTER — Encounter: Payer: Self-pay | Admitting: Unknown Physician Specialty

## 2017-08-14 ENCOUNTER — Ambulatory Visit (INDEPENDENT_AMBULATORY_CARE_PROVIDER_SITE_OTHER): Payer: Medicare Other | Admitting: Unknown Physician Specialty

## 2017-08-14 DIAGNOSIS — E785 Hyperlipidemia, unspecified: Secondary | ICD-10-CM

## 2017-08-14 DIAGNOSIS — I129 Hypertensive chronic kidney disease with stage 1 through stage 4 chronic kidney disease, or unspecified chronic kidney disease: Secondary | ICD-10-CM | POA: Diagnosis not present

## 2017-08-14 DIAGNOSIS — C3490 Malignant neoplasm of unspecified part of unspecified bronchus or lung: Secondary | ICD-10-CM | POA: Diagnosis not present

## 2017-08-14 DIAGNOSIS — E0822 Diabetes mellitus due to underlying condition with diabetic chronic kidney disease: Secondary | ICD-10-CM

## 2017-08-14 NOTE — Assessment & Plan Note (Signed)
Stable, continue present medications.   

## 2017-08-14 NOTE — Progress Notes (Signed)
BP 136/77   Pulse (!) 50   Temp 98.2 F (36.8 C) (Oral)   Ht 5\' 9"  (1.753 m)   Wt 175 lb 6.4 oz (79.6 kg)   SpO2 99%   BMI 25.90 kg/m    Subjective:    Patient ID: Philip Wood, male    DOB: 01/26/1946, 72 y.o.   MRN: [id number]  HPI: Philip Wood is a 72 y.o. male  Chief Complaint  Patient presents with  . Follow-up    1 month f/up   Diabetes: Taking metformin No hypoglycemic episodes No hyperglycemic episodes Feet problems:none BLast Hgb A1C: 5.9  Hypertension  Using medications without difficulty Average home BPs none   Using medication without problems or lightheadedness No chest pain with exertion or shortness of breath No Edema  Elevated Cholesterol Using medications without problems.  Last visit increased Atorvastatin.   No Muscle aches   Lung cancer Under the care of [facility].      Relevant past medical, surgical, family and social history reviewed and updated as indicated. Interim medical history since our last visit reviewed. Allergies and medications reviewed and updated.  Review of Systems  Per HPI unless specifically indicated above     Objective:    BP 136/77   Pulse (!) 50   Temp 98.2 F (36.8 C) (Oral)   Ht 5\' 9"  (1.753 m)   Wt 175 lb 6.4 oz (79.6 kg)   SpO2 99%   BMI 25.90 kg/m   Wt Readings from Last 3 Encounters:  08/14/17 175 lb 6.4 oz (79.6 kg)  08/13/17 171 lb (77.6 kg)  07/31/17 171 lb (77.6 kg)    Physical Exam  Constitutional: He is oriented to person, place, and time. He appears well-developed and well-nourished. No distress.  HENT:  Head: Normocephalic and atraumatic.  Eyes: Conjunctivae and lids are normal. Right eye exhibits no discharge. Left eye exhibits no discharge. No scleral icterus.  Neck: Normal range of motion. Neck supple. No JVD present. Carotid bruit is not present.  Cardiovascular: Normal rate, regular rhythm and normal heart sounds.  Pulmonary/Chest: Effort normal. No  respiratory distress. He has decreased breath sounds.  Abdominal: Normal appearance. There is no splenomegaly or hepatomegaly.  Musculoskeletal: Normal range of motion.  Neurological: He is alert and oriented to person, place, and time.  Skin: Skin is warm, dry and intact. No rash noted. No pallor.  Psychiatric: He has a normal mood and affect. His behavior is normal. Judgment and thought content normal.    Results for orders placed or performed during the hospital encounter of 08/13/17  Potassium  Result Value Ref Range   Potassium 4.0 3.5 - 5.1 mmol/L      Assessment & Plan:   Problem List Items Addressed This Visit      Unprioritized   Diabetes mellitus with chronic kidney disease ([place])    Check Hgb A1C. Stable, continue present medications.        Hyperlipidemia    Check lipid panel with latest increase in Atorvastatin.  No side-effects to dosage increase      Relevant Orders   Lipid Panel w/o Chol/HDL Ratio   Hypertensive CKD (chronic kidney disease)    Stable, continue present medications.        Relevant Orders   Comprehensive metabolic panel   Lung cancer ([place])    Getting biopsy next week.  No treatments yet          Follow up plan: Return in  about 6 months (around 02/13/2018).

## 2017-08-14 NOTE — Assessment & Plan Note (Signed)
Check lipid panel with latest increase in Atorvastatin.  No side-effects to dosage increase

## 2017-08-14 NOTE — Assessment & Plan Note (Addendum)
Check Hgb A1C. Stable, continue present medications.

## 2017-08-14 NOTE — Assessment & Plan Note (Signed)
Getting biopsy next week.  No treatments yet

## 2017-08-15 LAB — LIPID PANEL W/O CHOL/HDL RATIO
Cholesterol, Total: 152 mg/dL (ref 100–199)
HDL: 43 mg/dL (ref >39)
LDL Calculated: 91 mg/dL (ref 0–99)
Triglycerides: 90 mg/dL (ref 0–149)
VLDL Cholesterol Cal: 18 mg/dL (ref 5–40)

## 2017-08-15 LAB — COMPREHENSIVE METABOLIC PANEL
ALBUMIN: 3.6 g/dL (ref 3.5–4.8)
ALK PHOS: 86 IU/L (ref 39–117)
ALT: 9 IU/L (ref 0–44)
AST: 12 IU/L (ref 0–40)
Albumin/Globulin Ratio: 1.9 (ref 1.2–2.2)
BUN / CREAT RATIO: 17 (ref 10–24)
BUN: 18 mg/dL (ref 8–27)
Bilirubin Total: 0.4 mg/dL (ref 0.0–1.2)
CALCIUM: 8.9 mg/dL (ref 8.6–10.2)
CO2: 25 mmol/L (ref 20–29)
Chloride: 103 mmol/L (ref 96–106)
Creatinine, Ser: 1.03 mg/dL (ref 0.76–1.27)
GFR, EST AFRICAN AMERICAN: 84 mL/min/{1.73_m2} (ref >59)
GFR, EST NON AFRICAN AMERICAN: 73 mL/min/{1.73_m2} (ref >59)
GLOBULIN, TOTAL: 1.9 g/dL (ref 1.5–4.5)
Glucose: 98 mg/dL (ref 65–99)
Potassium: 5.1 mmol/L (ref 3.5–5.2)
SODIUM: 141 mmol/L (ref 134–144)
TOTAL PROTEIN: 5.5 g/dL — AB (ref 6.0–8.5)

## 2017-08-17 ENCOUNTER — Encounter: Payer: Self-pay | Admitting: Unknown Physician Specialty

## 2017-08-18 ENCOUNTER — Ambulatory Visit: Payer: Self-pay | Admitting: Unknown Physician Specialty

## 2017-08-19 ENCOUNTER — Ambulatory Visit: Payer: Medicare Other | Admitting: Anesthesiology

## 2017-08-19 ENCOUNTER — Other Ambulatory Visit: Payer: Self-pay

## 2017-08-19 ENCOUNTER — Ambulatory Visit
Admission: RE | Admit: 2017-08-19 | Discharge: 2017-08-19 | Disposition: A | Discharge status: Home / Self Care | Payer: Medicare Other | Source: Ambulatory Visit | Attending: Internal Medicine | Admitting: Internal Medicine

## 2017-08-19 ENCOUNTER — Encounter
Admission: RE | Disposition: A | Discharge status: Home / Self Care | Payer: Self-pay | Source: Ambulatory Visit | Attending: Internal Medicine

## 2017-08-19 DIAGNOSIS — E7800 Pure hypercholesterolemia, unspecified: Secondary | ICD-10-CM | POA: Insufficient documentation

## 2017-08-19 DIAGNOSIS — C3490 Malignant neoplasm of unspecified part of unspecified bronchus or lung: Secondary | ICD-10-CM | POA: Diagnosis not present

## 2017-08-19 DIAGNOSIS — E1122 Type 2 diabetes mellitus with diabetic chronic kidney disease: Secondary | ICD-10-CM | POA: Diagnosis not present

## 2017-08-19 DIAGNOSIS — I129 Hypertensive chronic kidney disease with stage 1 through stage 4 chronic kidney disease, or unspecified chronic kidney disease: Secondary | ICD-10-CM | POA: Insufficient documentation

## 2017-08-19 DIAGNOSIS — E785 Hyperlipidemia, unspecified: Secondary | ICD-10-CM | POA: Insufficient documentation

## 2017-08-19 DIAGNOSIS — Z7984 Long term (current) use of oral hypoglycemic drugs: Secondary | ICD-10-CM | POA: Insufficient documentation

## 2017-08-19 DIAGNOSIS — N189 Chronic kidney disease, unspecified: Secondary | ICD-10-CM | POA: Insufficient documentation

## 2017-08-19 DIAGNOSIS — Z538 Procedure and treatment not carried out for other reasons: Secondary | ICD-10-CM | POA: Insufficient documentation

## 2017-08-19 HISTORY — PX: ELECTROMAGNETIC NAVIGATION BROCHOSCOPY: SHX5369

## 2017-08-19 LAB — POCT I-STAT 4, (NA,K, GLUC, HGB,HCT)
Glucose, Bld: 98 mg/dL (ref 65–99)
HEMATOCRIT: 43 % (ref 39.0–52.0)
Hemoglobin: 14.6 g/dL (ref 13.0–17.0)
Potassium: 4.4 mmol/L (ref 3.5–5.1)
Sodium: 141 mmol/L (ref 135–145)

## 2017-08-19 LAB — GLUCOSE, CAPILLARY: Glucose-Capillary: 96 mg/dL (ref 65–99)

## 2017-08-19 SURGERY — ELECTROMAGNETIC NAVIGATION BRONCHOSCOPY
Anesthesia: Choice

## 2017-08-19 MED ORDER — FAMOTIDINE 20 MG PO TABS
ORAL_TABLET | ORAL | Status: AC
  Administered 2017-08-19: 20 mg via ORAL
  Filled 2017-08-19: qty 1

## 2017-08-19 MED ORDER — SODIUM CHLORIDE 0.9 % IV SOLN
INTRAVENOUS | Status: DC
  Administered 2017-08-19: 12:00:00 via INTRAVENOUS

## 2017-08-19 MED ORDER — FAMOTIDINE 20 MG PO TABS
20.0000 mg | ORAL_TABLET | Freq: Once | ORAL | Status: AC
  Administered 2017-08-19: 20 mg via ORAL

## 2017-08-19 NOTE — Progress Notes (Signed)
Per Dr. Mortimer Fries and Dr. Andree Elk, surgery cancelled pending cardiac clearance.  Pt and family's questions answered.

## 2017-08-20 ENCOUNTER — Other Ambulatory Visit: Payer: Self-pay | Admitting: Unknown Physician Specialty

## 2017-08-20 ENCOUNTER — Encounter: Payer: Self-pay | Admitting: Internal Medicine

## 2017-08-20 ENCOUNTER — Other Ambulatory Visit: Payer: Self-pay | Admitting: *Deleted

## 2017-08-20 ENCOUNTER — Telehealth: Payer: Self-pay | Admitting: *Deleted

## 2017-08-20 DIAGNOSIS — R079 Chest pain, unspecified: Secondary | ICD-10-CM

## 2017-08-20 NOTE — Telephone Encounter (Signed)
spoke with wife and informed her pt is scheduled to see Dr. Fletcher Anon on 08/21/17 @2pm  for cardiology clearance for his ENB. Nothing further needed.

## 2017-08-21 ENCOUNTER — Encounter: Payer: Self-pay | Admitting: Cardiovascular Disease

## 2017-08-21 ENCOUNTER — Ambulatory Visit (INDEPENDENT_AMBULATORY_CARE_PROVIDER_SITE_OTHER): Payer: Medicare Other | Admitting: Cardiovascular Disease

## 2017-08-21 VITALS — BP 150/84 | HR 63 | Ht 69.0 in | Wt 176.5 lb

## 2017-08-21 DIAGNOSIS — R0989 Other specified symptoms and signs involving the circulatory and respiratory systems: Secondary | ICD-10-CM

## 2017-08-21 DIAGNOSIS — R079 Chest pain, unspecified: Secondary | ICD-10-CM | POA: Diagnosis not present

## 2017-08-21 DIAGNOSIS — I739 Peripheral vascular disease, unspecified: Secondary | ICD-10-CM

## 2017-08-21 DIAGNOSIS — I10 Essential (primary) hypertension: Secondary | ICD-10-CM | POA: Diagnosis not present

## 2017-08-21 DIAGNOSIS — Z01810 Encounter for preprocedural cardiovascular examination: Secondary | ICD-10-CM

## 2017-08-21 DIAGNOSIS — E785 Hyperlipidemia, unspecified: Secondary | ICD-10-CM

## 2017-08-21 NOTE — Patient Instructions (Addendum)
Medication Instructions:  Your physician recommends that you continue on your current medications as directed. Please refer to the Current Medication list given to you today.   Labwork: none  Testing/Procedures: Your physician has requested that you have a carotid duplex. This test is an ultrasound of the carotid arteries in your neck. It looks at blood flow through these arteries that supply the brain with blood. Allow one hour for this exam. There are no restrictions or special instructions.   Your physician has requested that you have a lexiscan myoview. For further information please visit [email or web address]. Please follow instruction sheet, as given.  [place]  Your caregiver has ordered a Stress Test with nuclear imaging. The purpose of this test is to evaluate the blood supply to your heart muscle. This procedure is referred to as a "Non-Invasive Stress Test." This is because other than having an IV started in your vein, nothing is inserted or "invades" your body. Cardiac stress tests are done to find areas of poor blood flow to the heart by determining the extent of coronary artery disease (CAD). Some patients exercise on a treadmill, which naturally increases the blood flow to your heart, while others who are  unable to walk on a treadmill due to physical limitations have a pharmacologic/chemical stress agent called Lexiscan . This medicine will mimic walking on a treadmill by temporarily increasing your coronary blood flow.   Please note: these test may take anywhere between 2-4 hours to complete  PLEASE REPORT TO [facility] AT THE FIRST DESK WILL DIRECT YOU WHERE TO GO  Date of Procedure:_____________________________________  Arrival Time for Procedure:______________________________  Instructions regarding medication:   _XX_ : Hold diabetes medication (METFORMIN) morning of procedure   PLEASE NOTIFY THE OFFICE AT LEAST 24 HOURS IN ADVANCE  IF YOU ARE UNABLE TO KEEP YOUR APPOINTMENT.  [phone] AND  PLEASE NOTIFY NUCLEAR MEDICINE AT [facility] AT LEAST 24 HOURS IN ADVANCE IF YOU ARE UNABLE TO KEEP YOUR APPOINTMENT. [phone]  How to prepare for your Myoview test:  1. Do not eat or drink after midnight 2. No caffeine for 24 hours prior to test 3. No smoking 24 hours prior to test. 4. Your medication may be taken with water.  If your doctor stopped a medication because of this test, do not take that medication. 5. Ladies, please do not wear dresses.  Skirts or pants are appropriate. Please wear a short sleeve shirt. 6. No perfume, cologne or lotion. 7. Wear comfortable walking shoes. No heels!        Follow-Up: Your physician recommends that you schedule a follow-up appointment in: [place].  If you need a refill on your cardiac medications before your next appointment, please call your pharmacy.   Cardiac Nuclear Scan A cardiac nuclear scan is a test that measures blood flow to the heart when a person is resting and when he or she is exercising. The test looks for problems such as:  Not enough blood reaching a portion of the heart.  The heart muscle not working normally.  You may need this test if:  You have heart disease.  You have had abnormal lab results.  You have had heart surgery or angioplasty.  You have chest pain.  You have shortness of breath.  In this test, a radioactive dye (tracer) is injected into your bloodstream. After the tracer has traveled to your heart, an imaging device is used to measure how much of  the tracer is absorbed by or distributed to various areas of your heart. This procedure is usually done at a hospital and takes 2-4 hours. Tell a health care provider about:  Any allergies you have.  All medicines you are taking, including vitamins, herbs, eye drops, creams, and over-the-counter medicines.  Any problems you or family members have had with the use of  anesthetic medicines.  Any blood disorders you have.  Any surgeries you have had.  Any medical conditions you have.  Whether you are pregnant or may be pregnant. What are the risks? Generally, this is a safe procedure. However, problems may occur, including:  Serious chest pain and heart attack. This is only a risk if the stress portion of the test is done.  Rapid heartbeat.  Sensation of warmth in your chest. This usually passes quickly.  What happens before the procedure?  Ask your health care provider about changing or stopping your regular medicines. This is especially important if you are taking diabetes medicines or blood thinners.  Remove your jewelry on the day of the procedure. What happens during the procedure?  An IV tube will be inserted into one of your veins.  Your health care provider will inject a small amount of radioactive tracer through the tube.  You will wait for 20-40 minutes while the tracer travels through your bloodstream.  Your heart activity will be monitored with an electrocardiogram (ECG).  You will lie down on an exam table.  Images of your heart will be taken for about 15-20 minutes.  You may be asked to exercise on a treadmill or stationary bike. While you exercise, your heart's activity will be monitored with an ECG, and your blood pressure will be checked. If you are unable to exercise, you may be given a medicine to increase blood flow to parts of your heart.  When blood flow to your heart has peaked, a tracer will again be injected through the IV tube.  After 20-40 minutes, you will get back on the exam table and have more images taken of your heart.  When the procedure is over, your IV tube will be removed. The procedure may vary among health care providers and hospitals. Depending on the type of tracer used, scans may need to be repeated 3-4 hours later. What happens after the procedure?  Unless your health care provider tells you  otherwise, you may return to your normal schedule, including diet, activities, and medicines.  Unless your health care provider tells you otherwise, you may increase your fluid intake. This will help flush the contrast dye from your body. Drink enough fluid to keep your urine clear or pale yellow.  It is up to you to get your test results. Ask your health care provider, or the department that is doing the test, when your results will be ready. Summary  A cardiac nuclear scan measures the blood flow to the heart when a person is resting and when he or she is exercising.  You may need this test if you are at risk for heart disease.  Tell your health care provider if you are pregnant.  Unless your health care provider tells you otherwise, increase your fluid intake. This will help flush the contrast dye from your body. Drink enough fluid to keep your urine clear or pale yellow. This information is not intended to replace advice given to you by your health care provider. Make sure you discuss any questions you have with your health care provider.  Document Released: 05/02/2004 Document Revised: 04/09/2016 Document Reviewed: 03/16/2013 Elsevier Interactive Patient Education  2017 [employer].

## 2017-08-21 NOTE — Progress Notes (Signed)
Cardiology Office Note   Date:  08/21/2017   ID:  Philip Wood, DOB Sep 30, 1945, MRN [id number]  PCP:  Kathrine Haddock, NP  Cardiologist:   Kathlyn Sacramento, MD   Chief Complaint  Patient presents with  . OTHER    Cardiac clearance lung biopsy c/o chest pain. Meds reviewed verbally with pt.      History of Present Illness: Philip Wood is a 72 y.o. male who was referred by Dr. Mortimer Fries for preoperative cardiovascular evaluation before bronchoscopy.  The patient has prolonged history of tobacco use and was recently found to have pulmonary nodules. He has multiple chronic medical conditions that include type 2 diabetes, hypertension, hyperlipidemia, tobacco use, COPD and peripheral arterial disease with bilateral leg claudication followed by [facility]. He reports previous work-up for atypical chest pain more than 10 years ago by Dr. Clayborn Bigness which was negative. The patient reports intermittent episodes of left-sided chest pain described as sharp discomfort lasting for a few seconds.  This happens mainly with physical activities.  He denies any tightness or heaviness.  He reports stable exertional dyspnea with no orthopnea, PND or leg edema. He is also bothered by bilateral leg claudication which has been relatively stable over the last year. He smokes half a pack per day and has been smoking for about 55 years.  He has family history of coronary artery disease.  His father died at the age of 90 of myocardial infarction.   Past Medical History:  Diagnosis Date  . Anginal pain ([place])   . Cancer (HCC)    laryngeal  . Chronic kidney disease   . COPD (chronic obstructive pulmonary disease) ([place])   . Diabetes mellitus without complication ([place])   . GERD (gastroesophageal reflux disease)   . Hyperlipidemia   . Hypertension   . Lumbago   . Neuropathy   . Tobacco abuse disorder     Past Surgical History:  Procedure Laterality Date  . CHOLECYSTECTOMY    .  ELECTROMAGNETIC NAVIGATION BROCHOSCOPY N/A 08/19/2017   Procedure: ELECTROMAGNETIC NAVIGATION BRONCHOSCOPY;  Surgeon: Flora Lipps, MD;  Location: [facility] ORS;  Service: Cardiopulmonary;  Laterality: N/A;  . THROAT SURGERY       Current Outpatient Medications  Medication Sig Dispense Refill  . allopurinol (ZYLOPRIM) 100 MG tablet TAKE 1 TABLET BY MOUTH ONCE DAILY 90 tablet 0  . aspirin 81 MG tablet Take 81 mg by mouth daily.    Marland Kitchen atorvastatin (LIPITOR) 40 MG tablet Take 1 tablet (40 mg total) by mouth daily. (Patient taking differently: Take 40 mg by mouth daily at 6 PM. ) 90 tablet 3  . fluticasone furoate-vilanterol (BREO ELLIPTA) 100-25 MCG/INH AEPB Inhale 1 puff into the lungs daily. 60 each 5  . metFORMIN (GLUCOPHAGE) 500 MG tablet TAKE 1 TABLET BY MOUTH TWICE DAILY WITH A MEAL 180 tablet 1  . metoprolol tartrate (LOPRESSOR) 25 MG tablet TAKE 1 TABLET BY MOUTH TWICE DAILY 180 tablet 1  . ramipril (ALTACE) 10 MG capsule TAKE 1 CAPSULE BY MOUTH ONCE DAILY 90 capsule 3  . SPIRIVA HANDIHALER 18 MCG inhalation capsule PLACE 1 CAPSULE INTO INHALER AND INHALE DAILY (Patient taking differently: PLACE 1 CAPSULE INTO INHALER AND INHALE DAILY in the afternoon) 90 capsule 1   No current facility-administered medications for this visit.     Allergies:   Patient has no known allergies.    Social History:  The patient  reports that he has been smoking cigarettes.  He has a  31.80 pack-year smoking history. He has never used smokeless tobacco. He reports that he does not drink alcohol or use drugs.   Family History:  The patient's family history includes Brain cancer in his sister; Cervical cancer in his sister; Diabetes in his sister; Heart attack in his father; Heart disease in his father; Lung cancer in his brother, brother, brother, and brother; Lupus in his sister; Rectal cancer in his brother; Stomach cancer in his sister and sister.    ROS:  Please see the history of present illness.   Otherwise,  review of systems are positive for none.   All other systems are reviewed and negative.    PHYSICAL EXAM: VS:  BP (!) 150/84 (BP Location: Right Arm, Patient Position: Sitting, Cuff Size: Normal)   Pulse 63   Ht 5\' 9"  (1.753 m)   Wt 176 lb 8 oz (80.1 kg)   BMI 26.06 kg/m  , BMI Body mass index is 26.06 kg/m. GEN: Well nourished, well developed, in no acute distress  HEENT: normal  Neck: no JVD,  or masses.  Left carotid bruit Cardiac: RRR; no murmurs, rubs, or gallops,no edema  Respiratory:  clear to auscultation bilaterally, normal work of breathing GI: soft, nontender, nondistended, + BS MS: no deformity or atrophy  Skin: warm and dry, no rash Neuro:  Strength and sensation are intact Psych: euthymic mood, full affect   EKG:  EKG is ordered today. The ekg ordered today demonstrates sinus rhythm with a PVC and  no significant ST or T wave changes.   Recent Labs: 07/17/2017: Platelets 166; TSH 2.680 08/14/2017: ALT 9; BUN 18; Creatinine, Ser 1.03 08/19/2017: Hemoglobin 14.6; Potassium 4.4; Sodium 141    Lipid Panel    Component Value Date/Time   CHOL 152 08/14/2017 0956   CHOL 175 01/11/2016 0914   TRIG 90 08/14/2017 0956   TRIG 364 (H) 01/11/2016 0914   HDL 43 08/14/2017 0956   VLDL 73 (H) 01/11/2016 0914   LDLCALC 91 08/14/2017 0956      Wt Readings from Last 3 Encounters:  08/21/17 176 lb 8 oz (80.1 kg)  08/19/17 175 lb (79.4 kg)  08/14/17 175 lb 6.4 oz (79.6 kg)      PAD Screen 08/21/2017  Previous PAD dx? No  Previous surgical procedure? Yes  Pain with walking? No  Feet/toe relief with dangling? No  Painful, non-healing ulcers? No  Extremities discolored? No      ASSESSMENT AND PLAN:  1.  Preoperative cardiovascular evaluation for bronchoscopy and possible biopsy: The patient reports atypical chest pain.  EKG does not show significant ischemic changes other than some PVCs.  The patient has multiple risk factors for coronary artery disease and reduced  functional capacity due to claudication.  Due to that, I recommend evaluation with a pharmacologic nuclear stress test for risk stratification.  2.  Left carotid bruit: I requested carotid Doppler.  3.  Peripheral arterial disease with bilateral leg claudication: Followed by [facility].  4.  Hyperlipidemia: Continue treatment with atorvastatin with a target LDL of less than 70.  5.  Essential hypertension: Blood pressure is not optimally controlled.  Consider switching metoprolol to carvedilol or increasing ramipril.   Disposition:   FU with me in 6 weeks  Signed,  Kathlyn Sacramento, MD  08/21/2017 2:50 PM    [place]

## 2017-08-21 NOTE — H&P (View-Only) (Signed)
Cardiology Office Note   Date:  08/21/2017   ID:  Philip Wood, DOB 12-Jun-1945, MRN [id number]  PCP:  Kathrine Haddock, NP  Cardiologist:   Kathlyn Sacramento, MD   Chief Complaint  Patient presents with  . OTHER    Cardiac clearance lung biopsy c/o chest pain. Meds reviewed verbally with pt.      History of Present Illness: Philip Wood is a 72 y.o. male who was referred by Dr. Mortimer Fries for preoperative cardiovascular evaluation before bronchoscopy.  The patient has prolonged history of tobacco use and was recently found to have pulmonary nodules. He has multiple chronic medical conditions that include type 2 diabetes, hypertension, hyperlipidemia, tobacco use, COPD and peripheral arterial disease with bilateral leg claudication followed by [facility]. He reports previous work-up for atypical chest pain more than 10 years ago by Dr. Clayborn Bigness which was negative. The patient reports intermittent episodes of left-sided chest pain described as sharp discomfort lasting for a few seconds.  This happens mainly with physical activities.  He denies any tightness or heaviness.  He reports stable exertional dyspnea with no orthopnea, PND or leg edema. He is also bothered by bilateral leg claudication which has been relatively stable over the last year. He smokes half a pack per day and has been smoking for about 55 years.  He has family history of coronary artery disease.  His father died at the age of 29 of myocardial infarction.   Past Medical History:  Diagnosis Date  . Anginal pain ([place])   . Cancer (HCC)    laryngeal  . Chronic kidney disease   . COPD (chronic obstructive pulmonary disease) ([place])   . Diabetes mellitus without complication ([place])   . GERD (gastroesophageal reflux disease)   . Hyperlipidemia   . Hypertension   . Lumbago   . Neuropathy   . Tobacco abuse disorder     Past Surgical History:  Procedure Laterality Date  . CHOLECYSTECTOMY    .  ELECTROMAGNETIC NAVIGATION BROCHOSCOPY N/A 08/19/2017   Procedure: ELECTROMAGNETIC NAVIGATION BRONCHOSCOPY;  Surgeon: Flora Lipps, MD;  Location: [facility] ORS;  Service: Cardiopulmonary;  Laterality: N/A;  . THROAT SURGERY       Current Outpatient Medications  Medication Sig Dispense Refill  . allopurinol (ZYLOPRIM) 100 MG tablet TAKE 1 TABLET BY MOUTH ONCE DAILY 90 tablet 0  . aspirin 81 MG tablet Take 81 mg by mouth daily.    Marland Kitchen atorvastatin (LIPITOR) 40 MG tablet Take 1 tablet (40 mg total) by mouth daily. (Patient taking differently: Take 40 mg by mouth daily at 6 PM. ) 90 tablet 3  . fluticasone furoate-vilanterol (BREO ELLIPTA) 100-25 MCG/INH AEPB Inhale 1 puff into the lungs daily. 60 each 5  . metFORMIN (GLUCOPHAGE) 500 MG tablet TAKE 1 TABLET BY MOUTH TWICE DAILY WITH A MEAL 180 tablet 1  . metoprolol tartrate (LOPRESSOR) 25 MG tablet TAKE 1 TABLET BY MOUTH TWICE DAILY 180 tablet 1  . ramipril (ALTACE) 10 MG capsule TAKE 1 CAPSULE BY MOUTH ONCE DAILY 90 capsule 3  . SPIRIVA HANDIHALER 18 MCG inhalation capsule PLACE 1 CAPSULE INTO INHALER AND INHALE DAILY (Patient taking differently: PLACE 1 CAPSULE INTO INHALER AND INHALE DAILY in the afternoon) 90 capsule 1   No current facility-administered medications for this visit.     Allergies:   Patient has no known allergies.    Social History:  The patient  reports that he has been smoking cigarettes.  He has a  31.80 pack-year smoking history. He has never used smokeless tobacco. He reports that he does not drink alcohol or use drugs.   Family History:  The patient's family history includes Brain cancer in his sister; Cervical cancer in his sister; Diabetes in his sister; Heart attack in his father; Heart disease in his father; Lung cancer in his brother, brother, brother, and brother; Lupus in his sister; Rectal cancer in his brother; Stomach cancer in his sister and sister.    ROS:  Please see the history of present illness.   Otherwise,  review of systems are positive for none.   All other systems are reviewed and negative.    PHYSICAL EXAM: VS:  BP (!) 150/84 (BP Location: Right Arm, Patient Position: Sitting, Cuff Size: Normal)   Pulse 63   Ht 5\' 9"  (1.753 m)   Wt 176 lb 8 oz (80.1 kg)   BMI 26.06 kg/m  , BMI Body mass index is 26.06 kg/m. GEN: Well nourished, well developed, in no acute distress  HEENT: normal  Neck: no JVD,  or masses.  Left carotid bruit Cardiac: RRR; no murmurs, rubs, or gallops,no edema  Respiratory:  clear to auscultation bilaterally, normal work of breathing GI: soft, nontender, nondistended, + BS MS: no deformity or atrophy  Skin: warm and dry, no rash Neuro:  Strength and sensation are intact Psych: euthymic mood, full affect   EKG:  EKG is ordered today. The ekg ordered today demonstrates sinus rhythm with a PVC and  no significant ST or T wave changes.   Recent Labs: 07/17/2017: Platelets 166; TSH 2.680 08/14/2017: ALT 9; BUN 18; Creatinine, Ser 1.03 08/19/2017: Hemoglobin 14.6; Potassium 4.4; Sodium 141    Lipid Panel    Component Value Date/Time   CHOL 152 08/14/2017 0956   CHOL 175 01/11/2016 0914   TRIG 90 08/14/2017 0956   TRIG 364 (H) 01/11/2016 0914   HDL 43 08/14/2017 0956   VLDL 73 (H) 01/11/2016 0914   LDLCALC 91 08/14/2017 0956      Wt Readings from Last 3 Encounters:  08/21/17 176 lb 8 oz (80.1 kg)  08/19/17 175 lb (79.4 kg)  08/14/17 175 lb 6.4 oz (79.6 kg)      PAD Screen 08/21/2017  Previous PAD dx? No  Previous surgical procedure? Yes  Pain with walking? No  Feet/toe relief with dangling? No  Painful, non-healing ulcers? No  Extremities discolored? No      ASSESSMENT AND PLAN:  1.  Preoperative cardiovascular evaluation for bronchoscopy and possible biopsy: The patient reports atypical chest pain.  EKG does not show significant ischemic changes other than some PVCs.  The patient has multiple risk factors for coronary artery disease and reduced  functional capacity due to claudication.  Due to that, I recommend evaluation with a pharmacologic nuclear stress test for risk stratification.  2.  Left carotid bruit: I requested carotid Doppler.  3.  Peripheral arterial disease with bilateral leg claudication: Followed by [facility].  4.  Hyperlipidemia: Continue treatment with atorvastatin with a target LDL of less than 70.  5.  Essential hypertension: Blood pressure is not optimally controlled.  Consider switching metoprolol to carvedilol or increasing ramipril.   Disposition:   FU with me in 6 weeks  Signed,  Kathlyn Sacramento, MD  08/21/2017 2:50 PM    [place]

## 2017-08-25 ENCOUNTER — Ambulatory Visit: Payer: Medicare Other | Admitting: Oncology

## 2017-08-25 ENCOUNTER — Ambulatory Visit
Admission: RE | Admit: 2017-08-25 | Discharge: 2017-08-25 | Disposition: A | Discharge status: Home / Self Care | Payer: Medicare Other | Source: Ambulatory Visit | Attending: Cardiovascular Disease | Admitting: Cardiovascular Disease

## 2017-08-25 ENCOUNTER — Institutional Professional Consult (permissible substitution): Payer: Medicare Other | Admitting: Radiation Oncology

## 2017-08-25 DIAGNOSIS — Z01810 Encounter for preprocedural cardiovascular examination: Secondary | ICD-10-CM | POA: Diagnosis not present

## 2017-08-25 DIAGNOSIS — R9439 Abnormal result of other cardiovascular function study: Secondary | ICD-10-CM | POA: Insufficient documentation

## 2017-08-25 DIAGNOSIS — R079 Chest pain, unspecified: Secondary | ICD-10-CM

## 2017-08-25 LAB — NM MYOCAR MULTI W/SPECT W/WALL MOTION / EF
CSEPHR: 53 %
CSEPPHR: 80 {beats}/min
LVDIAVOL: 126 mL (ref 62–150)
LVSYSVOL: 72 mL
NUC STRESS TID: 0.95
Rest HR: 43 {beats}/min

## 2017-08-25 MED ORDER — TECHNETIUM TC 99M TETROFOSMIN IV KIT
32.1900 | PACK | Freq: Once | INTRAVENOUS | Status: AC | PRN
  Administered 2017-08-25: 32.19 via INTRAVENOUS

## 2017-08-25 MED ORDER — REGADENOSON 0.4 MG/5ML IV SOLN
0.4000 mg | Freq: Once | INTRAVENOUS | Status: AC
  Administered 2017-08-25: 0.4 mg via INTRAVENOUS

## 2017-08-25 MED ORDER — TECHNETIUM TC 99M TETROFOSMIN IV KIT
14.0010 | PACK | Freq: Once | INTRAVENOUS | Status: AC | PRN
  Administered 2017-08-25: 14.001 via INTRAVENOUS

## 2017-08-26 ENCOUNTER — Ambulatory Visit: Payer: Medicare Other | Admitting: Internal Medicine

## 2017-08-26 ENCOUNTER — Telehealth: Payer: Self-pay | Admitting: Cardiovascular Disease

## 2017-08-26 DIAGNOSIS — R9439 Abnormal result of other cardiovascular function study: Secondary | ICD-10-CM

## 2017-08-26 DIAGNOSIS — Z01810 Encounter for preprocedural cardiovascular examination: Secondary | ICD-10-CM

## 2017-08-26 NOTE — Telephone Encounter (Signed)
Notes recorded by Emily Filbert, RN on 08/26/2017 at 4:14 PM EDT I called and spoke with the patient's wife, Deneise Lever (ok per [facility]) about his stress test results. She is aware that the blood flow to his heart looks good, but we need to proceed with an echo to confirm his heart muscle function per Dr. Fletcher Anon- prior to his procedure on 09/02/17.  Per the patient's wife, it is ok to proceed with scheduling his echo. She is aware that we are trying to get the patient worked in for an echo and that we will be back in touch with her/ the patient with an appointment date/ time. The patient's wife is agreeable and will relay this information to the patient.   ------  Notes recorded by Wellington Hampshire, MD on 08/26/2017 at 12:22 PM EDT Inform patient that stress test showed no clear blockages but EF was reduced. I recommend an echo to evaluate this. This should be expedited given that his procedure is next week.

## 2017-08-26 NOTE — Telephone Encounter (Signed)
Per Lyman Bishop, the patient is coming in tomorrow morning at 10:30 am for his echo to be done. She states his wife is aware of the appointment date/ time.  Will forward to Dr. Fletcher Anon as an Jul[facility] [place]ier.

## 2017-08-27 ENCOUNTER — Other Ambulatory Visit: Payer: Self-pay

## 2017-08-27 ENCOUNTER — Ambulatory Visit (INDEPENDENT_AMBULATORY_CARE_PROVIDER_SITE_OTHER): Payer: Medicare Other

## 2017-08-27 DIAGNOSIS — R9439 Abnormal result of other cardiovascular function study: Secondary | ICD-10-CM

## 2017-08-27 DIAGNOSIS — Z01810 Encounter for preprocedural cardiovascular examination: Secondary | ICD-10-CM | POA: Diagnosis not present

## 2017-09-02 ENCOUNTER — Ambulatory Visit: Payer: Medicare Other | Admitting: Certified Registered"

## 2017-09-02 ENCOUNTER — Other Ambulatory Visit: Payer: Self-pay | Admitting: *Deleted

## 2017-09-02 ENCOUNTER — Ambulatory Visit
Admission: RE | Admit: 2017-09-02 | Discharge: 2017-09-02 | Disposition: A | Discharge status: Home / Self Care | Payer: Medicare Other | Source: Ambulatory Visit | Attending: Internal Medicine | Admitting: Internal Medicine

## 2017-09-02 ENCOUNTER — Ambulatory Visit: Payer: Medicare Other

## 2017-09-02 ENCOUNTER — Encounter
Admission: RE | Disposition: A | Discharge status: Home / Self Care | Payer: Self-pay | Source: Ambulatory Visit | Attending: Internal Medicine

## 2017-09-02 ENCOUNTER — Other Ambulatory Visit: Payer: Self-pay

## 2017-09-02 DIAGNOSIS — R911 Solitary pulmonary nodule: Secondary | ICD-10-CM | POA: Diagnosis not present

## 2017-09-02 DIAGNOSIS — N189 Chronic kidney disease, unspecified: Secondary | ICD-10-CM | POA: Insufficient documentation

## 2017-09-02 DIAGNOSIS — E1122 Type 2 diabetes mellitus with diabetic chronic kidney disease: Secondary | ICD-10-CM | POA: Diagnosis not present

## 2017-09-02 DIAGNOSIS — E1142 Type 2 diabetes mellitus with diabetic polyneuropathy: Secondary | ICD-10-CM | POA: Diagnosis not present

## 2017-09-02 DIAGNOSIS — R9389 Abnormal findings on diagnostic imaging of other specified body structures: Secondary | ICD-10-CM

## 2017-09-02 DIAGNOSIS — Z79899 Other long term (current) drug therapy: Secondary | ICD-10-CM | POA: Diagnosis not present

## 2017-09-02 DIAGNOSIS — E785 Hyperlipidemia, unspecified: Secondary | ICD-10-CM | POA: Insufficient documentation

## 2017-09-02 DIAGNOSIS — J449 Chronic obstructive pulmonary disease, unspecified: Secondary | ICD-10-CM | POA: Diagnosis not present

## 2017-09-02 DIAGNOSIS — I129 Hypertensive chronic kidney disease with stage 1 through stage 4 chronic kidney disease, or unspecified chronic kidney disease: Secondary | ICD-10-CM | POA: Diagnosis not present

## 2017-09-02 DIAGNOSIS — Z8249 Family history of ischemic heart disease and other diseases of the circulatory system: Secondary | ICD-10-CM | POA: Diagnosis not present

## 2017-09-02 DIAGNOSIS — F17210 Nicotine dependence, cigarettes, uncomplicated: Secondary | ICD-10-CM | POA: Insufficient documentation

## 2017-09-02 DIAGNOSIS — Z7982 Long term (current) use of aspirin: Secondary | ICD-10-CM | POA: Insufficient documentation

## 2017-09-02 DIAGNOSIS — I493 Ventricular premature depolarization: Secondary | ICD-10-CM | POA: Diagnosis not present

## 2017-09-02 DIAGNOSIS — R0789 Other chest pain: Secondary | ICD-10-CM | POA: Diagnosis not present

## 2017-09-02 DIAGNOSIS — Z7689 Persons encountering health services in other specified circumstances: Secondary | ICD-10-CM | POA: Diagnosis not present

## 2017-09-02 DIAGNOSIS — R918 Other nonspecific abnormal finding of lung field: Secondary | ICD-10-CM | POA: Diagnosis not present

## 2017-09-02 DIAGNOSIS — K219 Gastro-esophageal reflux disease without esophagitis: Secondary | ICD-10-CM | POA: Diagnosis not present

## 2017-09-02 DIAGNOSIS — E1151 Type 2 diabetes mellitus with diabetic peripheral angiopathy without gangrene: Secondary | ICD-10-CM | POA: Diagnosis not present

## 2017-09-02 DIAGNOSIS — Z7984 Long term (current) use of oral hypoglycemic drugs: Secondary | ICD-10-CM | POA: Diagnosis not present

## 2017-09-02 DIAGNOSIS — Z7951 Long term (current) use of inhaled steroids: Secondary | ICD-10-CM | POA: Diagnosis not present

## 2017-09-02 HISTORY — PX: ELECTROMAGNETIC NAVIGATION BROCHOSCOPY: SHX5369

## 2017-09-02 LAB — GLUCOSE, CAPILLARY
Glucose-Capillary: 92 mg/dL (ref 65–99)
Glucose-Capillary: 86 mg/dL (ref 65–99)

## 2017-09-02 SURGERY — ELECTROMAGNETIC NAVIGATION BRONCHOSCOPY
Anesthesia: Choice | Laterality: Left

## 2017-09-02 MED ORDER — MIDAZOLAM HCL 2 MG/2ML IJ SOLN
INTRAMUSCULAR | Status: AC
  Filled 2017-09-02: qty 2

## 2017-09-02 MED ORDER — FAMOTIDINE 20 MG PO TABS
20.0000 mg | ORAL_TABLET | Freq: Once | ORAL | Status: AC
  Administered 2017-09-02: 20 mg via ORAL

## 2017-09-02 MED ORDER — MIDAZOLAM HCL 2 MG/2ML IJ SOLN
INTRAMUSCULAR | Status: DC | PRN
  Administered 2017-09-02: 1 mg via INTRAVENOUS

## 2017-09-02 MED ORDER — SUGAMMADEX SODIUM 200 MG/2ML IV SOLN
INTRAVENOUS | Status: DC | PRN
  Administered 2017-09-02: 200 mg via INTRAVENOUS

## 2017-09-02 MED ORDER — LABETALOL HCL 5 MG/ML IV SOLN
INTRAVENOUS | Status: DC | PRN
  Administered 2017-09-02: 5 mg via INTRAVENOUS

## 2017-09-02 MED ORDER — DEXAMETHASONE SODIUM PHOSPHATE 10 MG/ML IJ SOLN
INTRAMUSCULAR | Status: DC | PRN
  Administered 2017-09-02: 6 mg via INTRAVENOUS

## 2017-09-02 MED ORDER — ROCURONIUM BROMIDE 100 MG/10ML IV SOLN
INTRAVENOUS | Status: DC | PRN
  Administered 2017-09-02: 20 mg via INTRAVENOUS

## 2017-09-02 MED ORDER — ONDANSETRON HCL 4 MG/2ML IJ SOLN: 4.0000 mg | Freq: Once | INTRAMUSCULAR | Status: DC | PRN

## 2017-09-02 MED ORDER — FAMOTIDINE 20 MG PO TABS
ORAL_TABLET | ORAL | Status: AC
  Administered 2017-09-02: 20 mg
  Filled 2017-09-02: qty 1

## 2017-09-02 MED ORDER — ETOMIDATE 2 MG/ML IV SOLN
INTRAVENOUS | Status: AC
  Filled 2017-09-02: qty 10

## 2017-09-02 MED ORDER — ETOMIDATE 2 MG/ML IV SOLN
INTRAVENOUS | Status: DC | PRN
  Administered 2017-09-02: 20 mg via INTRAVENOUS

## 2017-09-02 MED ORDER — SODIUM CHLORIDE 0.9 % IV SOLN
INTRAVENOUS | Status: DC
  Administered 2017-09-02: 12:00:00 via INTRAVENOUS

## 2017-09-02 MED ORDER — FENTANYL CITRATE (PF) 100 MCG/2ML IJ SOLN: 25.0000 ug | INTRAMUSCULAR | Status: DC | PRN

## 2017-09-02 MED ORDER — FENTANYL CITRATE (PF) 100 MCG/2ML IJ SOLN
INTRAMUSCULAR | Status: DC | PRN
  Administered 2017-09-02 (×2): 50 ug via INTRAVENOUS

## 2017-09-02 MED ORDER — GLYCOPYRROLATE 0.2 MG/ML IJ SOLN
INTRAMUSCULAR | Status: DC | PRN
  Administered 2017-09-02: 0.2 mg via INTRAVENOUS

## 2017-09-02 MED ORDER — FENTANYL CITRATE (PF) 100 MCG/2ML IJ SOLN
INTRAMUSCULAR | Status: AC
  Filled 2017-09-02: qty 2

## 2017-09-02 MED ORDER — ONDANSETRON HCL 4 MG/2ML IJ SOLN
INTRAMUSCULAR | Status: DC | PRN
  Administered 2017-09-02: 4 mg via INTRAVENOUS

## 2017-09-02 MED ORDER — PROPOFOL 10 MG/ML IV BOLUS
INTRAVENOUS | Status: AC
  Filled 2017-09-02: qty 20

## 2017-09-02 NOTE — Anesthesia Preprocedure Evaluation (Signed)
Anesthesia Evaluation  Patient identified by MRN, date of birth, ID band Patient awake    Reviewed: Allergy & Precautions, H&P , NPO status , Patient's Chart, lab work & pertinent test results, reviewed documented beta blocker date and time   Airway Mallampati: II  TM Distance: >3 FB Neck ROM: full    Dental  (+) Teeth Intact   Pulmonary neg pulmonary ROS, COPD, Current Smoker,    Pulmonary exam normal        Cardiovascular Exercise Tolerance: Good hypertension, On Medications + angina with exertion + Peripheral Vascular Disease  negative cardio ROS Normal cardiovascular exam Rhythm:regular Rate:Normal  Dr. Audelia Acton evaluates as moderate risk assessment.  Pt aware and we have reviewed the risks and benefits of the above plan.  JA   Neuro/Psych negative neurological ROS  negative psych ROS   GI/Hepatic negative GI ROS, Neg liver ROS, GERD  Medicated,  Endo/Other  negative endocrine ROSdiabetes  Renal/GU Renal diseasenegative Renal ROS  negative genitourinary   Musculoskeletal   Abdominal   Peds  Hematology negative hematology ROS (+)   Anesthesia Other Findings Past Medical History: No date: Anginal pain (HCC) No date: Cancer ([place])     Comment:  laryngeal No date: Chronic kidney disease No date: COPD (chronic obstructive pulmonary disease) (HCC) No date: Diabetes mellitus without complication (HCC) No date: GERD (gastroesophageal reflux disease) No date: Hyperlipidemia No date: Hypertension No date: Lumbago No date: Neuropathy No date: Tobacco abuse disorder Past Surgical History: No date: CHOLECYSTECTOMY 08/19/2017: ELECTROMAGNETIC NAVIGATION BROCHOSCOPY; N/A     Comment:  Procedure: ELECTROMAGNETIC NAVIGATION BRONCHOSCOPY;                Surgeon: Flora Lipps, MD;  Location: [facility] ORS;  Service:              Cardiopulmonary;  Laterality: N/A; No date: THROAT SURGERY BMI    Body Mass Index:  25.99 kg/m      Reproductive/Obstetrics negative OB ROS                             Anesthesia Physical Anesthesia Plan  ASA: IV  Anesthesia Plan: General ETT   Post-op Pain Management:    Induction:   PONV Risk Score and Plan: 2  Airway Management Planned:   Additional Equipment:   Intra-op Plan:   Post-operative Plan:   Informed Consent: I have reviewed the patients History and Physical, chart, labs and discussed the procedure including the risks, benefits and alternatives for the proposed anesthesia with the patient or authorized representative who has indicated his/her understanding and acceptance.   Dental Advisory Given  Plan Discussed with: CRNA  Anesthesia Plan Comments:         Anesthesia Quick Evaluation

## 2017-09-02 NOTE — Transfer of Care (Signed)
Immediate Anesthesia Transfer of Care Note  Patient: Philip Wood  Procedure(s) Performed: ELECTROMAGNETIC NAVIGATION BRONCHOSCOPY (Left )  Patient Location: PACU  Anesthesia Type:General  Level of Consciousness: awake, alert  and oriented  Airway & Oxygen Therapy: Patient Spontanous Breathing and Patient connected to face mask oxygen  Post-op Assessment: Report given to RN and Post -op Vital signs reviewed and stable  Post vital signs: Reviewed and stable  Last Vitals:  Vitals Value Taken Time  BP    Temp    Pulse 80 09/02/2017  2:02 PM  Resp 16 09/02/2017  2:02 PM  SpO2 92 % 09/02/2017  2:02 PM  Vitals shown include unvalidated device data.  Last Pain:  Vitals:   09/02/17 1126  TempSrc: Oral  PainSc: 0-No pain         Complications: No apparent anesthesia complications

## 2017-09-02 NOTE — Anesthesia Procedure Notes (Signed)
Procedure Name: Intubation Date/Time: 09/02/2017 1:03 PM Performed by: Philbert Riser, CRNA Pre-anesthesia Checklist: Patient identified, Emergency Drugs available, Suction available, Patient being monitored and Timeout performed Patient Re-evaluated:Patient Re-evaluated prior to induction Oxygen Delivery Method: Circle system utilized and Simple face mask Preoxygenation: Pre-oxygenation with 100% oxygen Induction Type: IV induction Ventilation: Mask ventilation without difficulty Laryngoscope Size: Mac and 4 Grade View: Grade II Tube type: Oral Tube size: 8.5 mm Number of attempts: 1 Airway Equipment and Method: Stylet Placement Confirmation: ETT inserted through vocal cords under direct vision,  positive ETCO2 and breath sounds checked- equal and bilateral Secured at: 22 cm Tube secured with: Tape Dental Injury: Teeth and Oropharynx as per pre-operative assessment

## 2017-09-02 NOTE — Progress Notes (Signed)
Ct chest w/o ordered per DK and pt to f/u after CT chest

## 2017-09-02 NOTE — Interval H&P Note (Signed)
History and Physical Interval Note: Patient alert and awake Denies any cardiac or resp symtpoms  No wheezing No crackles No focal deficits   The Risks and Benefits of the Bronchoscopy procedure with ENB were explained to patient  I have discussed the risk for Acute Bleeding, increased chance of Infection, increased chance of Respiratory Failure and Cardiac Arrest, Stroke and Death.  I have also explained to avoid all types of NSAIDs 1 week prior to procedure date  to decrease chance of bleeding, and also to avoid food and drinks the midnight prior to procedure.  The procedure consists of a video camera with a light source to be placed and inserted  into the lungs to  look for abnormal tissue and to obtain tissue samples by using needle and biopsy tools.  The patient understand the risks and benefits and have agreed to proceed with procedure.   09/02/2017 11:40 AM  Philip Wood  has presented today for surgery, with the diagnosis of LUNG NODULE  The various methods of treatment have been discussed with the patient and family. After consideration of risks, benefits and other options for treatment, the patient has consented to  Procedure(s): ELECTROMAGNETIC NAVIGATION BRONCHOSCOPY (Left) as a surgical intervention .  The patient's history has been reviewed, patient examined, no change in status, stable for surgery.  I have reviewed the patient's chart and labs.  Questions were answered to the patient's satisfaction.    I have explained to patient that he is moderate risk for intra and post op cardiac and pulmonary complications   [name]

## 2017-09-02 NOTE — Op Note (Signed)
Electromagnetic Navigation Bronchoscopy: Indication: lung mass/nodule RUL  Preoperative Diagnosis:lung nodule/mass Post Procedure Diagnosis:lung nodule/mass Consent: Verbal/Written  The Risks and Benefits of the procedure explained to patient/family prior to start of procedure and I have discussed the risk for acute bleeding, increased chance of infection, increased chance of respiratory failure and cardiac arrest and death.  I have also explained to avoid all types of NSAIDs to decrease chance of bleeding, and to avoid food and drinks the midnight prior to procedure.  The procedure consists of a video camera with a light source to be placed and inserted  into the lungs to  look for abnormal tissue and to obtain tissue samples by using needle and biopsy tools.  The patient/family understand the risks and benefits and have agreed to proceed with procedure.   Hand washing performed prior to starting the procedure.   Type of Anesthesia: see Anesthesiology records .   Procedure Performed:  Virtual Bronchoscopy with Multi-planar Image analysis, 3-D reconstruction of coronal, sagittal and multi-planar images for the purposes of planning real-time bronchoscopy using the iLogic Electromagnetic Navigation Bronchoscopy System (superDimension).  Description of Procedure: After obtaining informed consent from the patient, the above sedative and anesthetic measures were carried out, flexible fiberoptic bronchoscope was inserted via Endotracheal tube after patient was intubated by CNA/Anesthesiologist.   The virtual camera was then placed into the central portion of the trachea. The trachea itself was inspected.  The main carina, right and left midstem bronchus and all the segmental and subsegmental airways by virtual bronchoscopy were inspected. The camera was directed to standard registration points at the following centers: main carina, right upper lobe bronchus, right lower lobe bronchus, right  middle lobe bronchus, left upper lobe bronchus, and the left lower lobe bronchus. This data was transferred to the i-Logic ENB system for real-time bronchoscopy.   The scope was then navigated to the RUL  for tissue sampling  Specimans Obtained:  Transbronchial Forceps Biopsy times:3   Fluoroscopy:  Fluoroscopy was utilized during the course of this procedure to assure that biopsies were taken in a safe manner under fluoroscopic guidance with spot films required.   Complications:None  Estimated Blood Loss: minimal approx 1cc  Monitoring:  The patient was monitored with continuous oximetry and received supplemental nasal cannula oxygen throughout the procedure. In addition, serial blood pressure measurements and continuous electrocardiography showed these physiologic parameters to remain tolerable throughout the procedure.   Assessment and Plan/Additional Comments: Follow up Pathology Reports    Corrin Parker, M.D.  [facility]  Medical Director [place] Director [facility] Cardio-Pulmonary Department

## 2017-09-02 NOTE — OR Nursing (Signed)
Dr Andree Elk reviewed DR Jacklynn Ganong clearance note.

## 2017-09-02 NOTE — Discharge Instructions (Signed)
Flexible Bronchoscopy, Care After These instructions give you information on caring for yourself after your procedure. Your doctor may also give you more specific instructions. Call your doctor if you have any problems or questions after your procedure. Follow these instructions at home:  Do not eat or drink anything for 2 hours after your procedure. If you try to eat or drink before the medicine wears off, food or drink could go into your lungs. You could also burn yourself.  After 2 hours have passed and when you can cough and gag normally, you may eat soft food and drink liquids slowly.  The day after the test, you may eat your normal diet.  You may do your normal activities.  Keep all doctor visits. Get help right away if:  You get more and more short of breath.  You get light-headed.  You feel like you are going to pass out (faint).  You have chest pain.  You have new problems that worry you.  You cough up more than a little blood.  You cough up more blood than before. This information is not intended to replace advice given to you by your health care provider. Make sure you discuss any questions you have with your health care provider. Document Released: 02/02/2009 Document Revised: 09/13/2015 Document Reviewed: 12/10/2012 Elsevier Interactive Patient Education  2017 [place]   1) The drugs that you were given will stay in your system until tomorrow so for the next 24 hours you should not:  A) Drive an automobile B) Make any legal decisions C) Drink any alcoholic beverage   2) You may resume regular meals tomorrow.  Today it is better to start with liquids and gradually work up to solid foods.  You may eat anything you prefer, but it is better to start with liquids, then soup and crackers, and gradually work up to solid foods.   3) Please notify your doctor immediately if you have any unusual bleeding, trouble  breathing, redness and pain at the surgery site, drainage, fever, or pain not relieved by medication.    4) Additional Instructions:        Please contact your physician with any problems or Same Day Surgery at [phone], Monday through Friday 6 am to 4 pm, or [place] at [facility] number at [phone].AMBULATORY SURGERY  DISCHARGE INSTRUCTIONS   5) The drugs that you were given will stay in your system until tomorrow so for the next 24 hours you should not:  D) Drive an automobile E) Make any legal decisions F) Drink any alcoholic beverage   6) You may resume regular meals tomorrow.  Today it is better to start with liquids and gradually work up to solid foods.  You may eat anything you prefer, but it is better to start with liquids, then soup and crackers, and gradually work up to solid foods.   7) Please notify your doctor immediately if you have any unusual bleeding, trouble breathing, redness and pain at the surgery site, drainage, fever, or pain not relieved by medication.    8) Additional Instructions:        Please contact your physician with any problems or Same Day Surgery at [phone], Monday through Friday 6 am to 4 pm, or [place] at [facility] number at [phone].

## 2017-09-02 NOTE — Anesthesia Post-op Follow-up Note (Signed)
Anesthesia QCDR form completed.        

## 2017-09-02 NOTE — Op Note (Signed)
Electromagnetic Navigation Bronchoscopy: Indication: lung mass/nodule LLL  Preoperative Diagnosis:lung nodule/mass Post Procedure Diagnosis:lung nodule/mass Consent: Verbal/Written  The Risks and Benefits of the procedure explained to patient/family prior to start of procedure and I have discussed the risk for acute bleeding, increased chance of infection, increased chance of respiratory failure and cardiac arrest and death.  I have also explained to avoid all types of NSAIDs to decrease chance of bleeding, and to avoid food and drinks the midnight prior to procedure.  The procedure consists of a video camera with a light source to be placed and inserted  into the lungs to  look for abnormal tissue and to obtain tissue samples by using needle and biopsy tools.  The patient/family understand the risks and benefits and have agreed to proceed with procedure.   Hand washing performed prior to starting the procedure.   Type of Anesthesia: see Anesthesiology records .   Procedure Performed:  Virtual Bronchoscopy with Multi-planar Image analysis, 3-D reconstruction of coronal, sagittal and multi-planar images for the purposes of planning real-time bronchoscopy using the iLogic Electromagnetic Navigation Bronchoscopy System (superDimension).  Description of Procedure: After obtaining informed consent from the patient, the above sedative and anesthetic measures were carried out, flexible fiberoptic bronchoscope was inserted via Endotracheal tube after patient was intubated by CNA/Anesthesiologist.   The virtual camera was then placed into the central portion of the trachea. The trachea itself was inspected.  The main carina, right and left midstem bronchus and all the segmental and subsegmental airways by virtual bronchoscopy were inspected. The camera was directed to standard registration points at the following centers: main carina, right upper lobe bronchus, right lower lobe bronchus, right  middle lobe bronchus, left upper lobe bronchus, and the left lower lobe bronchus. This data was transferred to the i-Logic ENB system for real-time bronchoscopy.   The scope was then navigated to the LLL nodule for tissue sampling  Specimans Obtained:  Transbronchial Fine Needle Aspirations 21G times:1  Transbronchial Forceps Biopsy times:3   Fluoroscopy:  Fluoroscopy was utilized during the course of this procedure to assure that biopsies were taken in a safe manner under fluoroscopic guidance with spot films required.   Complications:None  Estimated Blood Loss: minimal approx 1cc  Monitoring:  The patient was monitored with continuous oximetry and received supplemental nasal cannula oxygen throughout the procedure. In addition, serial blood pressure measurements and continuous electrocardiography showed these physiologic parameters to remain tolerable throughout the procedure.   Assessment and Plan/Additional Comments: Follow up Pathology Reports    Corrin Parker, M.D.  [facility]  Medical Director [place] Director [facility] Cardio-Pulmonary Department

## 2017-09-02 NOTE — Progress Notes (Signed)
Spoke with Dr Andree Elk regarding potassium results, no new orders

## 2017-09-03 ENCOUNTER — Other Ambulatory Visit: Payer: Self-pay | Admitting: *Deleted

## 2017-09-03 ENCOUNTER — Encounter: Payer: Self-pay | Admitting: Internal Medicine

## 2017-09-03 DIAGNOSIS — R918 Other nonspecific abnormal finding of lung field: Secondary | ICD-10-CM

## 2017-09-04 LAB — CYTOLOGY - NON PAP

## 2017-09-04 LAB — SURGICAL PATHOLOGY

## 2017-09-07 ENCOUNTER — Ambulatory Visit: Payer: Self-pay | Admitting: *Deleted

## 2017-09-07 ENCOUNTER — Ambulatory Visit (INDEPENDENT_AMBULATORY_CARE_PROVIDER_SITE_OTHER): Payer: Medicare Other

## 2017-09-07 DIAGNOSIS — R0989 Other specified symptoms and signs involving the circulatory and respiratory systems: Secondary | ICD-10-CM

## 2017-09-07 NOTE — Telephone Encounter (Signed)
Patient had IV in his  l  Arm 5 days ago for an x ray procedure.Pt states two days ago pain started - he has some redness and tenderness and swelling.Appointment made with Kathrine Haddock for tommorow  Reason for Disposition . Localized pain, redness or hard lump along vein  Answer Assessment - Initial Assessment Questions 1. ONSET: "When did the pain start?"      TWO DAYS  AGO 2. LOCATION: "Where is the pain located?"       l ARM  WRIST  AREA   3. PAIN: "How bad is the pain?" (Scale 1-10; or mild, moderate, severe)   - MILD (1-3): doesn't interfere with normal activities   - MODERATE (4-7): interferes with normal activities (e.g., work or school) or awakens from sleep   - SEVERE (8-10): excruciating pain, unable to do any normal activities, unable to hold a cup of water        7-8   4. WORK OR EXERCISE: "Has there been any recent work or exercise that involved this part of the body?"         HAD  IV 5  DAYS  AGO   5. CAUSE: "What do you think is causing the arm pain?"       IV    6. OTHER SYMPTOMS: "Do you have any other symptoms?" (e.g., neck pain, swelling, rash, fever, numbness, weakness)     Pain / Swelling  /redness   7. PREGNANCY: "Is there any chance you are pregnant?" "When was your last menstrual period?"     n/a  Protocols used: ARM PAIN-A-AH

## 2017-09-07 NOTE — Anesthesia Postprocedure Evaluation (Signed)
Anesthesia Post Note  Patient: Philip Wood  Procedure(s) Performed: ELECTROMAGNETIC NAVIGATION BRONCHOSCOPY (Left )  Patient location during evaluation: PACU Anesthesia Type: General Level of consciousness: awake and alert Pain management: pain level controlled Vital Signs Assessment: post-procedure vital signs reviewed and stable Respiratory status: spontaneous breathing, nonlabored ventilation, respiratory function stable and patient connected to nasal cannula oxygen Cardiovascular status: blood pressure returned to baseline and stable Postop Assessment: no apparent nausea or vomiting Anesthetic complications: no     Last Vitals:  Vitals:   09/02/17 1445 09/02/17 1455  BP:  136/89  Pulse: 64 62  Resp: (!) 22 18  Temp:  36.7 C  SpO2: 94% 94%    Last Pain:  Vitals:   09/03/17 0832  TempSrc:   PainSc: 0-No pain                 Molli Barrows

## 2017-09-08 ENCOUNTER — Encounter: Payer: Self-pay | Admitting: Unknown Physician Specialty

## 2017-09-08 ENCOUNTER — Other Ambulatory Visit: Payer: Self-pay | Admitting: *Deleted

## 2017-09-08 ENCOUNTER — Ambulatory Visit (INDEPENDENT_AMBULATORY_CARE_PROVIDER_SITE_OTHER): Payer: Medicare Other | Admitting: Unknown Physician Specialty

## 2017-09-08 VITALS — BP 155/81 | HR 58 | Temp 97.7°F | Ht 69.0 in | Wt 175.9 lb

## 2017-09-08 DIAGNOSIS — L03114 Cellulitis of left upper limb: Secondary | ICD-10-CM

## 2017-09-08 MED ORDER — DOXYCYCLINE HYCLATE 100 MG PO TABS: 100.0000 mg | ORAL_TABLET | Freq: Two times a day (BID) | ORAL | 0 refills | Status: DC

## 2017-09-08 NOTE — Progress Notes (Signed)
BP (!) 155/81   Pulse (!) 58   Temp 97.7 F (36.5 C) (Oral)   Ht 5\' 9"  (1.753 m)   Wt 175 lb 14.4 oz (79.8 kg)   SpO2 98%   BMI 25.98 kg/m    Subjective:    Patient ID: Philip Wood, male    DOB: 15-Feb-1946, 72 y.o.   MRN: [id number]  HPI: Philip Wood is a 72 y.o. male  Chief Complaint  Patient presents with  . Arm Swelling    pt states he had an IV in last Wednesday, has had L arm pain and swelling since then    Philip Wood is newly diagnosed with lung cancer who presents with arm swelling and pain starting  about 3 days ago.  IV was on Thursday before that.  It has gotten better since then. Had a red patch which has faded.   Cannot ID aggravating or alleviating factors.   No fever, N/V/D, headache.    Relevant past medical, surgical, family and social history reviewed and updated as indicated. Interim medical history since our last visit reviewed. Allergies and medications reviewed and updated.  Review of Systems  Constitutional: Negative.   HENT: Negative.   Respiratory: Positive for cough.   Gastrointestinal: Negative.   Genitourinary: Negative.   Psychiatric/Behavioral: Negative.     Per HPI unless specifically indicated above     Objective:    BP (!) 155/81   Pulse (!) 58   Temp 97.7 F (36.5 C) (Oral)   Ht 5\' 9"  (1.753 m)   Wt 175 lb 14.4 oz (79.8 kg)   SpO2 98%   BMI 25.98 kg/m   Wt Readings from Last 3 Encounters:  09/08/17 175 lb 14.4 oz (79.8 kg)  09/02/17 176 lb (79.8 kg)  08/21/17 176 lb 8 oz (80.1 kg)    Physical Exam  Constitutional: He is oriented to person, place, and time. He appears well-developed and well-nourished. No distress.  HENT:  Head: Normocephalic and atraumatic.  Eyes: Conjunctivae and lids are normal. Right eye exhibits no discharge. Left eye exhibits no discharge. No scleral icterus.  Cardiovascular: Normal rate.  Pulmonary/Chest: Effort normal.  Abdominal: Normal appearance. There is no splenomegaly or  hepatomegaly.  Musculoskeletal: Normal range of motion.       Left forearm: He exhibits tenderness and swelling.  Swelling and tenderness left medial arm above elbow.    Neurological: He is alert and oriented to person, place, and time.  Skin: Skin is intact. No rash noted. No pallor.  Psychiatric: He has a normal mood and affect. His behavior is normal. Judgment and thought content normal.    Results for orders placed or performed during the hospital encounter of 09/02/17  Glucose, capillary  Result Value Ref Range   Glucose-Capillary 92 65 - 99 mg/dL  Glucose, capillary  Result Value Ref Range   Glucose-Capillary 86 65 - 99 mg/dL  Surgical pathology  Result Value Ref Range   SURGICAL PATHOLOGY      Surgical Pathology CASE: [id number] PATIENT: Philip Wood Surgical Pathology Report     SPECIMEN SUBMITTED: A. Lung, left lower lobe; ENB transbronchial forceps biopsy  CLINICAL HISTORY: Bilateral lung nodules detected on screening CT,  LLL 1.5 cm 5.2 SUV, RUL 1.0 cm 4.5 SUV  PRE-OPERATIVE DIAGNOSIS: Suspicious lung nodules  POST-OPERATIVE DIAGNOSIS: None provided.     DIAGNOSIS: A. LUNG, LEFT LOWER LOBE; ENB TRANSBRONCHIAL FORCEPS BIOPSY: - NO LESIONAL CELLS IDENTIFIED.  Comment: Additional deeper sections were reviewed.  The sample consists of alveolar tissue with pigmented macrophages, anthracotic pigment, and scant background mucin.  There are no features which would correlate with the imaging findings.   GROSS DESCRIPTION: A. Labeled: LLL biopsy Received: In formalin Tissue fragment(s): Multiple Size: Aggregate, 0.6 x 0.2 x 0.1 cm Description: Pink-Tan fragments Entirely submitted in one cassette  2 Diff Quik stain slides prepared a t procedure.     Final Diagnosis performed by Bryan Lemma, MD.   Electronically signed 09/04/2017 4:55:01PM The electronic signature indicates that the named Attending Pathologist has evaluated the specimen   Technical component performed at [facility], [address] [place], [place] [address] Lab: [phone] Dir: Rush Farmer, MD, MMM  Professional component performed at [facility], [facility], [place], [place], [place] [address] Lab: [phone] Dir: Dellia Nims. Reuel Derby, MD   Cytology - Non PAP;  Result Value Ref Range   CYTOLOGY - NON GYN      Cytology - Non PAP CASE: [id number] PATIENT: Philip Wood Non-Gyn Cytology Report     SPECIMEN SUBMITTED: A. Lung, left lower lobe; ENB FNA  CLINICAL HISTORY: Bilateral lung nodules detected on screening CT,  LLL 1.5 cm 5.2 SUV, RUL 1.0 cm 4.5 SUV  PRE-OPERATIVE DIAGNOSIS: Suspicious lung nodules  POST-OPERATIVE DIAGNOSIS: None provided.     DIAGNOSIS: A. LUNG, LEFT LOWER LOBE; ENB FNA: - NON-DIAGNOSTIC. - ABUNDANT MACROPHAGES, SCANT BRONCHIAL EPITHELIAL CELLS.  Comment: Slides reviewed: 1 Diff-Quik stained slide, 1 Pap stained slide, 1 ThinPrep slide, and 1 cell block   GROSS DESCRIPTION: A. Site: Lower left lobe Procedure: ENB Cytotechnologist: Rivka Barbara; Shon Baton Specimen(s) collected: 1 diff Quik stained slides 1 Pap stained slides Specimen labeled: L LL FNA                      Volume: Not applicable                      Description: Pink CytoLyt solution                      Submitted for: ThinPrep and one cell block   Fin al Diagnosis performed by Bryan Lemma, MD.   Electronically signed 09/04/2017 5:11:00PM The electronic signature indicates that the named Attending Pathologist has evaluated the specimen  Technical component performed at [facility], [address] [place], [place] [address] Lab: [phone] Dir: Rush Farmer, MD, MMM  Professional component performed at [facility], [facility], [place], [place], [place] [address] Lab: [phone] Dir: Dellia Nims. Reuel Derby, MD   Surgical pathology  Result Value Ref Range   SURGICAL PATHOLOGY      Surgical  Pathology CASE: [id number] PATIENT: Philip Wood Surgical Pathology Report     SPECIMEN SUBMITTED: A. Lung, right upper lobe; ENB transbronchial forceps biopsy  CLINICAL HISTORY: Bilateral lung nodules detected on screening CT,  LLL 1.5 cm 5.2 SUV, RUL 1.0 cm 4.5 SUV  PRE-OPERATIVE DIAGNOSIS: Suspicious lung nodules  POST-OPERATIVE DIAGNOSIS: None provided.     DIAGNOSIS: A. LUNG, RIGHT UPPER LOBE; ENB TRANSBRONCHIAL FORCEPS BIOPSY: - NO LESIONAL CELLS IDENTIFIED.  Comment: Additional deeper sections were reviewed. The sample consists of alveolar tissue with abundant pigmented macrophages, blood, sparse anthracotic pigment, and scant background mucin.  There are no features which would correlate with the imaging findings.   GROSS DESCRIPTION: A. Labeled: RUL BX Received: In formalin Tissue fragment(s): Multiple Size: Aggregate, 0.8 x 0.3 x 0.1 cm Description: Pink-Tan fragments Entirely submitted  in one cassette  2 Diff Quik s tain slides prepared at the procedure.    Final Diagnosis performed by Bryan Lemma, MD.   Electronically signed 09/04/2017 5:00:18PM The electronic signature indicates that the named Attending Pathologist has evaluated the specimen  Technical component performed at [facility], [address] [place], [place] [address] Lab: [phone] Dir: Rush Farmer, MD, MMM  Professional component performed at [facility], [facility], [place], [place], [place] [address] Lab: [phone] Dir: Dellia Nims. Reuel Derby, MD       Assessment & Plan:   Problem List Items Addressed This Visit    None    Visit Diagnoses    Cellulitis of left upper extremity    -  Primary   New problem.  This seems to be improving but still swollen and tender around medial elbow.  Will rx Doxycycline for 10 days.  Warm soaks.         Follow up plan: Return if symptoms worsen or fail to improve.

## 2017-09-09 ENCOUNTER — Ambulatory Visit
Admission: RE | Admit: 2017-09-09 | Discharge: 2017-09-09 | Disposition: A | Discharge status: Home / Self Care | Payer: Medicare Other | Source: Ambulatory Visit | Attending: Radiation Oncology | Admitting: Radiation Oncology

## 2017-09-09 ENCOUNTER — Inpatient Hospital Stay: Payer: Medicare Other | Attending: Oncology | Admitting: Oncology

## 2017-09-09 ENCOUNTER — Other Ambulatory Visit: Payer: Self-pay

## 2017-09-09 ENCOUNTER — Encounter: Payer: Self-pay | Admitting: *Deleted

## 2017-09-09 ENCOUNTER — Encounter: Payer: Self-pay | Admitting: Radiation Oncology

## 2017-09-09 ENCOUNTER — Encounter: Payer: Self-pay | Admitting: Oncology

## 2017-09-09 VITALS — BP 139/72 | HR 60 | Temp 96.3°F | Wt 176.5 lb

## 2017-09-09 VITALS — BP 128/54 | Temp 96.8°F | Resp 18 | Wt 176.7 lb

## 2017-09-09 DIAGNOSIS — Z8521 Personal history of malignant neoplasm of larynx: Secondary | ICD-10-CM | POA: Diagnosis not present

## 2017-09-09 DIAGNOSIS — N189 Chronic kidney disease, unspecified: Secondary | ICD-10-CM | POA: Diagnosis not present

## 2017-09-09 DIAGNOSIS — J439 Emphysema, unspecified: Secondary | ICD-10-CM | POA: Insufficient documentation

## 2017-09-09 DIAGNOSIS — J449 Chronic obstructive pulmonary disease, unspecified: Secondary | ICD-10-CM | POA: Insufficient documentation

## 2017-09-09 DIAGNOSIS — K219 Gastro-esophageal reflux disease without esophagitis: Secondary | ICD-10-CM | POA: Insufficient documentation

## 2017-09-09 DIAGNOSIS — Z7984 Long term (current) use of oral hypoglycemic drugs: Secondary | ICD-10-CM | POA: Diagnosis not present

## 2017-09-09 DIAGNOSIS — F17210 Nicotine dependence, cigarettes, uncomplicated: Secondary | ICD-10-CM | POA: Insufficient documentation

## 2017-09-09 DIAGNOSIS — Z79899 Other long term (current) drug therapy: Secondary | ICD-10-CM | POA: Diagnosis not present

## 2017-09-09 DIAGNOSIS — Z809 Family history of malignant neoplasm, unspecified: Secondary | ICD-10-CM

## 2017-09-09 DIAGNOSIS — Z7982 Long term (current) use of aspirin: Secondary | ICD-10-CM | POA: Insufficient documentation

## 2017-09-09 DIAGNOSIS — I129 Hypertensive chronic kidney disease with stage 1 through stage 4 chronic kidney disease, or unspecified chronic kidney disease: Secondary | ICD-10-CM | POA: Diagnosis not present

## 2017-09-09 DIAGNOSIS — I712 Thoracic aortic aneurysm, without rupture: Secondary | ICD-10-CM | POA: Diagnosis not present

## 2017-09-09 DIAGNOSIS — C3432 Malignant neoplasm of lower lobe, left bronchus or lung: Secondary | ICD-10-CM | POA: Insufficient documentation

## 2017-09-09 DIAGNOSIS — I2510 Atherosclerotic heart disease of native coronary artery without angina pectoris: Secondary | ICD-10-CM | POA: Diagnosis not present

## 2017-09-09 DIAGNOSIS — R918 Other nonspecific abnormal finding of lung field: Secondary | ICD-10-CM | POA: Diagnosis not present

## 2017-09-09 DIAGNOSIS — I700 Atherosclerosis of aorta: Secondary | ICD-10-CM | POA: Insufficient documentation

## 2017-09-09 DIAGNOSIS — I209 Angina pectoris, unspecified: Secondary | ICD-10-CM | POA: Insufficient documentation

## 2017-09-09 DIAGNOSIS — R911 Solitary pulmonary nodule: Secondary | ICD-10-CM

## 2017-09-09 DIAGNOSIS — E1122 Type 2 diabetes mellitus with diabetic chronic kidney disease: Secondary | ICD-10-CM | POA: Diagnosis not present

## 2017-09-09 DIAGNOSIS — Z720 Tobacco use: Secondary | ICD-10-CM

## 2017-09-09 NOTE — Consult Note (Signed)
NEW PATIENT EVALUATION  Name: Philip Wood  MRN: [id number]  Date:   09/09/2017     DOB: May 01, 1945   This 72 y.o. 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  REFERRING PHYSICIAN: Kathrine Haddock, NP  CHIEF COMPLAINT:  Chief Complaint  Patient presents with  . Lung Mass    Initial Eval    DIAGNOSIS: The encounter diagnosis was Lung nodule.   PREVIOUS INVESTIGATIONS:  CT scans and PET/CT scan reviewed Bronchoscopy report reviewed Clinical notes reviewed  HPI: patient is a 72 year old male well known to our department having been treated for head and neck cancer over 6 years prior with complete response. He is been followed by his primary care physician and is had significant unintentional weight loss past couple months. He despite multiple warnings continue to smoke and was referred lung cancer screening clinic. CT scan of chest performed 07/21/2017 showed right upper and left lower lobe nodules worrisome for cigarettes bronchogenic carcinoma.he went on to have a PET CT scan showing a 1.5 sorry her left lower lobe nodule and aproximal 1 cm right upper lobe nodule both hypermetabolic and suspicious for malignancy.patient is fairly asymptomatic except for weight loss. He has continued follow-up care with ENT with no evidence of recurrent laryngeal cancer. Patient underwent EBUS biopsy of both lung nodules unfortunately no cellular tissue was retrieved. He is now referred to radiation oncology for opinion. He is doing well has a slight mucus productive cough no hemoptysis. He is suffering from dyspnea on exertion and some shortness of breath.  PLANNED TREATMENT REGIMEN: SB RT to left lung  PAST MEDICAL HISTORY:  has a past medical history of Anginal pain ([place]), Cancer ([place]), Chronic kidney disease, COPD (chronic obstructive pulmonary disease) ([place]), Diabetes mellitus without complication ([place]), GERD (gastroesophageal  reflux disease), Hyperlipidemia, Hypertension, Lumbago, Neuropathy, and Tobacco abuse disorder.    PAST SURGICAL HISTORY:  Past Surgical History:  Procedure Laterality Date  . CHOLECYSTECTOMY    . ELECTROMAGNETIC NAVIGATION BROCHOSCOPY N/A 08/19/2017   Procedure: ELECTROMAGNETIC NAVIGATION BRONCHOSCOPY;  Surgeon: Flora Lipps, MD;  Location: [facility] ORS;  Service: Cardiopulmonary;  Laterality: N/A;  . ELECTROMAGNETIC NAVIGATION BROCHOSCOPY Left 09/02/2017   Procedure: ELECTROMAGNETIC NAVIGATION BRONCHOSCOPY;  Surgeon: Flora Lipps, MD;  Location: [facility] ORS;  Service: Cardiopulmonary;  Laterality: Left;  . THROAT SURGERY      FAMILY HISTORY: family history includes Brain cancer in his sister; Cervical cancer in his sister; Diabetes in his sister; Heart attack in his father; Heart disease in his father; Lung cancer in his brother, brother, brother, and brother; Lupus in his sister; Rectal cancer in his brother; Stomach cancer in his sister and sister.  SOCIAL HISTORY:  reports that he has been smoking cigarettes.  He has a 31.80 pack-year smoking history. He has never used smokeless tobacco. He reports that he does not drink alcohol or use drugs.  ALLERGIES: Patient has no known allergies.  MEDICATIONS:  Current Outpatient Medications  Medication Sig Dispense Refill  . allopurinol (ZYLOPRIM) 100 MG tablet TAKE 1 TABLET BY MOUTH ONCE DAILY 90 tablet 0  . aspirin 81 MG tablet Take 81 mg by mouth daily.    Marland Kitchen atorvastatin (LIPITOR) 40 MG tablet Take 1 tablet (40 mg total) by mouth daily. (Patient taking differently: Take 40 mg by mouth daily at 6 PM. ) 90 tablet 3  . doxycycline (VIBRA-TABS) 100 MG tablet Take 1 tablet (100 mg total) by mouth 2 (  two) times daily. 20 tablet 0  . fluticasone furoate-vilanterol (BREO ELLIPTA) 100-25 MCG/INH AEPB Inhale 1 puff into the lungs daily. 60 each 5  . metFORMIN (GLUCOPHAGE) 500 MG tablet TAKE 1 TABLET BY MOUTH TWICE DAILY WITH A MEAL 180 tablet 1  . metoprolol  tartrate (LOPRESSOR) 25 MG tablet TAKE 1 TABLET BY MOUTH TWICE DAILY 180 tablet 1  . ramipril (ALTACE) 10 MG capsule TAKE 1 CAPSULE BY MOUTH ONCE DAILY 90 capsule 3  . SPIRIVA HANDIHALER 18 MCG inhalation capsule PLACE 1 CAPSULE INTO INHALER AND INHALE DAILY (Patient taking differently: PLACE 1 CAPSULE INTO INHALER AND INHALE DAILY in the afternoon) 90 capsule 1   No current facility-administered medications for this encounter.     ECOG PERFORMANCE STATUS:  0 - Asymptomatic  REVIEW OF SYSTEMS:  Patient denies any weight loss, fatigue, weakness, fever, chills or night sweats. Patient denies any loss of vision, blurred vision. Patient denies any ringing  of the ears or hearing loss. No irregular heartbeat. Patient denies heart murmur or history of fainting. Patient denies any chest pain or pain radiating to her upper extremities. Patient denies any shortness of breath, difficulty breathing at night, cough or hemoptysis. Patient denies any swelling in the lower legs. Patient denies any nausea vomiting, vomiting of blood, or coffee ground material in the vomitus. Patient denies any stomach pain. Patient states has had normal bowel movements no significant constipation or diarrhea. Patient denies any dysuria, hematuria or significant nocturia. Patient denies any problems walking, swelling in the joints or loss of balance. Patient denies any skin changes, loss of hair or loss of weight. Patient denies any excessive worrying or anxiety or significant depression. Patient denies any problems with insomnia. Patient denies excessive thirst, polyuria, polydipsia. Patient denies any swollen glands, patient denies easy bruising or easy bleeding. Patient denies any recent infections, allergies or URI. Patient "s visual fields have not changed significantly in recent time.    PHYSICAL EXAM: BP (!) 128/54   Temp (!) 96.8 F (36 C)   Resp 18   Wt 176 lb 11.2 oz (80.2 kg)   BMI 26.09 kg/m  Oral cavity is clear no  oral mucosal lesions are identified. No evidence of subject gastric cervical or supraclavicular adenopathy is noted. Well-developed well-nourished patient in NAD. HEENT reveals PERLA, EOMI, discs not visualized.  Oral cavity is clear. No oral mucosal lesions are identified. Neck is clear without evidence of cervical or supraclavicular adenopathy. Lungs are clear to A&P. Cardiac examination is essentially unremarkable with regular rate and rhythm without murmur rub or thrill. Abdomen is benign with no organomegaly or masses noted. Motor sensory and DTR levels are equal and symmetric in the upper and lower extremities. Cranial nerves II through XII are grossly intact. Proprioception is intact. No peripheral adenopathy or edema is identified. No motor or sensory levels are noted. Crude visual fields are within normal range.  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 A my review the other lesions are quite small and barely hypermetabolic although worrisome for other primary bronchogenic carcinomas. I would plan on delivering 6000 cGy in 5 fractions using SB RT treatment planning and delivery. Would use4D motion study as well as breathing restricting  protocol for his simulation. Risks and benefits of treatment including possibility of dysphasia cough fatigue alteration of blood counts were all discussed in detail. I personally set up and ordered CT simulation for next week. I've discussed discussed the case personally with medical oncology. Medical oncology concurs with my treatment plan. Will keep an INR his other lesions and can offer SB RT later date should those lesions progress. Patient and wife both and to comprehend my treatment plan  well.  I would like to take this opportunity to thank you for allowing me to participate in the care of your patient.[name], MD

## 2017-09-09 NOTE — Progress Notes (Signed)
Hematology/Oncology Follow up note [facility] Telephone:([phone] Fax:([phone]   Patient Care Team: Kathrine Haddock, NP as PCP - General (Nurse Practitioner) Beverly Gust, MD (Otolaryngology) Telford Nab, RN as Registered Nurse  REFERRING PROVIDER: Kathrine Haddock, NP CHIEF COMPLAINTS/PURPOSE OF CONSULTATION:  Evaluation of lung nodule  HISTORY OF PRESENTING ILLNESS:  Philip Wood is a  72 y.o.  male with PMH listed below who was referred to me for evaluation of lung nodule.  Patient follows up with primary care physician and reports significant unintentional weight loss for the past couple of months.   he has history of 31 pack a day smoking history, current smoker so he was referred to lung cancer screening clinic and had a CT chest scan done.  Image Study  CT chest wo 07/21/2017  1. Right upper and left lower lobe nodules, worrisome for synchronous bronchogenic carcinomas. Lung-RADS 4B, suspicious. Additional imaging evaluation or consultation with Pulmonology or Thoracic Surgery recommended. These results will be called to the ordering clinician or representative by the Radiologist Assistant, and communication documented in the PACS or zVision Dashboard. 2. Aortic atherosclerosis (ICD10-170.0). Coronary artery calcification. 3. Ascending aortic aneurysm NOS (ICD10-I71.9). Recommend annual imaging followup by CTA or MRA. This recommendation follows 2010 ACCF/AHA/AATS/ACR/ASA/SCA/SCAI/SIR/STS/SVM Guidelines for the Diagnosis and Management of Patients with Thoracic Aortic Disease. Circulation. 2010; 121: B284-X324. 4.  Emphysema (ICD10-J43.9).  PET scan 07/29/2017  1. The 1.5 cm left lower lobe nodule and the 1.0 by 0.7 cm right upper lobe solid nodule are both hypermetabolic and suspicious for malignancy. In addition, there a 5 mm peribronchovascular nodule in the right upper lobe with an SUV which, although only 1.6, is disproportionate to  the small size of the lesion and accordingly concerning for malignancy. No abnormal hypermetabolic activity in the neck, abdomen/pelvis, or skeleton is identified. No hypermetabolic adenopathy in the chest. 2. Other imaging findings of potential clinical significance: Small mucous retention cyst in the right maxillary sinus. Aortic Atherosclerosis (ICD10-I70.0) and Emphysema (ICD10-J43.9). Coronary atherosclerosis. Prostatomegaly. Prominent stool throughout the colon favors constipation. Descending and proximal sigmoid colon diverticulosis.  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  He got definitive radiation to the larynx, and the patient has been doing annual checkup with ENT Dr. Tami Ribas.  Patient's case was discussed on tumor conference on 07/30/2017 and consensus recommendation is to proceed with ENB biopsy of both lung nodule.  Patient present to clinic today accompanied by his wife.  He denies any shortness of breath, chest pain, abdominal pain, hemoptysis, headache   Or double vision.  He continues to smoke half a pack of cigarettes a day  INTERVAL HISTORY Philip Wood is a 72 y.o. male who has above history reviewed by me today presents for follow up visit for management of  Lung nodules. During the interval he has underwent broncoscopy biopsy of right upper lobe nodule and left lower lobe nodule.  He presents to discuss pathology results.  Continue to smoke a pack daily. Chronic cough. Other wise feeling fine.   Review of Systems  Constitutional: Negative for chills, fever, malaise/fatigue and weight loss.  HENT: Negative for congestion, ear discharge, ear pain, nosebleeds, sinus pain and sore throat.   Eyes: Negative for double vision, photophobia, pain, discharge and redness.    Respiratory:  Positive for cough. Negative for hemoptysis, sputum production, shortness of breath and wheezing.   Cardiovascular: Negative for chest pain, palpitations, orthopnea, claudication and leg swelling.  Gastrointestinal: Negative for abdominal pain, blood in stool, constipation, diarrhea, heartburn, melena, nausea and vomiting.  Genitourinary: Negative for dysuria, flank pain, frequency, hematuria and urgency.  Musculoskeletal: Negative for back pain, myalgias and neck pain.  Skin: Negative for itching and rash.  Neurological: Negative for dizziness, tingling, tremors, speech change, focal weakness, weakness and headaches.  Endo/Heme/Allergies: Negative for environmental allergies. Does not bruise/bleed easily.  Psychiatric/Behavioral: Negative for depression and hallucinations. The patient is not nervous/anxious.     MEDICAL HISTORY:  Past Medical History:  Diagnosis Date  . Anginal pain ([place])   . Cancer (H[facility])    laryngeal  . Chronic kidney disease   . COPD (chronic obstructive pulmonary disease) ([place])   . Diabetes mellitus without complication ([place])   . GERD (gastroesophageal reflux disease)   . Hyperlipidemia   . Hypertension   . Lumbago   . Neuropathy   . Tobacco abuse disorder     SURGICAL HISTORY: Past Surgical History:  Procedure Laterality Date  . CHOLECYSTECTOMY    . ELECTROMAGNETIC NAVIGATION BROCHOSCOPY N/A 08/19/2017   Procedure: ELECTROMAGNETIC NAVIGATION BRONCHOSCOPY;  Surgeon: Flora Lipps, MD;  Location: [facility] ORS;  Service: Cardiopulmonary;  Laterality: N/A;  . ELECTROMAGNETIC NAVIGATION BROCHOSCOPY Left 09/02/2017   Procedure: ELECTROMAGNETIC NAVIGATION BRONCHOSCOPY;  Surgeon: Flora Lipps, MD;  Location: [facility] ORS;  Service: Cardiopulmonary;  Laterality: Left;  . THROAT SURGERY      SOCIAL HISTORY: Social History   Socioeconomic History  . Marital status: Married    Spouse name: Not on file  . Number of children: Not on file  . Years of education:  Not on file  . Highest education level: Not on file  Occupational History  . Not on file  Social Needs  . Financial resource strain: Not hard at all  . Food insecurity:    Worry: Never true    Inability: Never true  . Transportation needs:    Medical: No    Non-medical: No  Tobacco Use  . Smoking status: Current Every Day Smoker    Packs/day: 0.60    Years: 53.00    Pack years: 31.80    Types: Cigarettes  . Smokeless tobacco: Never Used  Substance and Sexual Activity  . Alcohol use: No    Alcohol/week: 0.0 oz  . Drug use: No  . Sexual activity: Not Currently  Lifestyle  . Physical activity:    Days per week: 0 days    Minutes per session: 0 min  . Stress: Not at all  Relationships  . Social connections:    Talks on phone: Twice a week    Gets together: Once a week    Attends religious service: More than 4 times per year    Active member of club or organization: No    Attends meetings of clubs or organizations: Never    Relationship status: Married  . Intimate partner violence:    Fear of current or ex partner: No    Emotionally abused: No    Physically abused: No    Forced sexual activity: No  Other Topics Concern  . Not on file  Social History Narrative   Works part time at [employer]    FAMILY HISTORY: Family History  Problem Relation Age of Onset  . Heart disease Father   . Heart attack Father   . Brain cancer  Sister   . Cervical cancer Sister   . Rectal cancer Brother   . Lung cancer Brother   . Lung cancer Brother   . Lung cancer Brother   . Stomach cancer Sister   . Lung cancer Brother   . Stomach cancer Sister   . Diabetes Sister   . Lupus Sister     ALLERGIES:  has No Known Allergies.  MEDICATIONS:  Current Outpatient Medications  Medication Sig Dispense Refill  . allopurinol (ZYLOPRIM) 100 MG tablet TAKE 1 TABLET BY MOUTH ONCE DAILY 90 tablet 0  . aspirin 81 MG tablet Take 81 mg by mouth daily.    Marland Kitchen atorvastatin (LIPITOR) 40 MG tablet Take 1  tablet (40 mg total) by mouth daily. (Patient taking differently: Take 40 mg by mouth daily at 6 PM. ) 90 tablet 3  . doxycycline (VIBRA-TABS) 100 MG tablet Take 1 tablet (100 mg total) by mouth 2 (two) times daily. 20 tablet 0  . fluticasone furoate-vilanterol (BREO ELLIPTA) 100-25 MCG/INH AEPB Inhale 1 puff into the lungs daily. 60 each 5  . metFORMIN (GLUCOPHAGE) 500 MG tablet TAKE 1 TABLET BY MOUTH TWICE DAILY WITH A MEAL 180 tablet 1  . metoprolol tartrate (LOPRESSOR) 25 MG tablet TAKE 1 TABLET BY MOUTH TWICE DAILY 180 tablet 1  . ramipril (ALTACE) 10 MG capsule TAKE 1 CAPSULE BY MOUTH ONCE DAILY 90 capsule 3  . SPIRIVA HANDIHALER 18 MCG inhalation capsule PLACE 1 CAPSULE INTO INHALER AND INHALE DAILY (Patient taking differently: PLACE 1 CAPSULE INTO INHALER AND INHALE DAILY in the afternoon) 90 capsule 1   No current facility-administered medications for this visit.      PHYSICAL EXAMINATION: ECOG PERFORMANCE STATUS: 1 - Symptomatic but completely ambulatory Vitals:   09/09/17 1430  BP: 139/72  Pulse: 60  Temp: (!) 96.3 F (35.7 C)  SpO2: 98%   Filed Weights   09/09/17 1430  Weight: 176 lb 8 oz (80.1 kg)    Physical Exam  Constitutional: He is oriented to person, place, and time. He appears well-developed and well-nourished. No distress.  HENT:  Head: Normocephalic and atraumatic.  Right Ear: External ear normal.  Left Ear: External ear normal.  Mouth/Throat: Oropharynx is clear and moist. No oropharyngeal exudate.  Eyes: Pupils are equal, round, and reactive to light. Conjunctivae and EOM are normal. No scleral icterus.  Neck: Normal range of motion. Neck supple.  Cardiovascular: Normal rate, regular rhythm and normal heart sounds.  No murmur heard. Pulmonary/Chest: Effort normal and breath sounds normal. No respiratory distress. He has no wheezes. He has no rales. He exhibits no tenderness.  Abdominal: Soft. Bowel sounds are normal. He exhibits no distension and no  mass. There is no tenderness. There is no guarding.  Musculoskeletal: Normal range of motion. He exhibits no edema or deformity.  Lymphadenopathy:    He has no cervical adenopathy.  Neurological: He is alert and oriented to person, place, and time. No cranial nerve deficit. Coordination normal.  Skin: Skin is warm and dry. No rash noted.  Psychiatric: He has a normal mood and affect. His behavior is normal. Thought content normal.     LABORATORY DATA:  I have reviewed the data as listed Lab Results  Component Value Date   WBC 6.1 07/17/2017   HGB 14.6 08/19/2017   HCT 43.0 08/19/2017   MCV 92 07/17/2017   PLT 166 07/17/2017   Recent Labs    01/12/17 1020 07/17/17 1131 08/13/17 1315 08/14/17 0956 08/19/17 1202  NA 140 142  --  141 141  K 5.3* 5.3* 4.0 5.1 4.4  CL 101 103  --  103  --   CO2 25 27  --  25  --   GLUCOSE 96 96  --  98 98  BUN 15 14  --  18  --   CREATININE 1.18 1.04  --  1.03  --   CALCIUM 9.1 9.2  --  8.9  --   GFRNONAA 62 72  --  73  --   GFRAA 71 83  --  84  --   PROT 5.8* 5.8*  --  5.5*  --   ALBUMIN 4.0 3.8  --  3.6  --   AST 13 9  --  12  --   ALT 9 8  --  9  --   ALKPHOS 92 90  --  86  --   BILITOT 0.4 0.5  --  0.4  --        ASSESSMENT & PLAN:  1. Lung nodules   2. Abnormal findings on diagnostic imaging of lung   3. Tobacco abuse   4. Family history of cancer    Pathology of biopsies showed no lesional cells identified. However he is a high risk patient and negative biopsy results do not exclude malignancy.  Discussed with RadOnc Dr.Chrystal. Dr. Baruch Gouty plan to SBRT of left lung lesion.  Plan repeat CT chest wo contrast in 3 months to monitor right lung lesion. Patient agrees with plan.   ## Smoking cessation was discussed with patient and cessation program information was given to patient.  # family history of stomach cancer, brain cancer, refer to meet with genetic counselor for genetic testing.   All questions were answered. The  patient knows to call the clinic with any problems questions or concerns.  Return of visit: 3 months to discuss image results.  Thank you for this kind referral and the opportunity to participate in the care of this patient. A copy of today's note is routed to referring provider    Earlie Server, MD, PhD Hematology Oncology [facility] at [facility] Pager- [phone] 09/09/2017

## 2017-09-10 NOTE — Progress Notes (Signed)
  Oncology Nurse Navigator Documentation  Navigator Location: [facility]Med [facility]c (09/09/17 1500)   )Navigator Encounter Type: Follow-up Appt;Initial RadOnc (09/09/17 1500)                     Patient Visit Type: MedOnc;RadOnc (09/09/17 1500) Treatment Phase: Pre-Tx/Tx Discussion (09/09/17 1500) Barriers/Navigation Needs: Coordination of Care (09/09/17 1500)   Interventions: Coordination of Care (09/09/17 1500)   Coordination of Care: Appts;Radiology (09/09/17 1500)       met with patient during follow up appointments to review pathology results and discuss treatment planning. All questions answered at the time of visit. Reviewed upcoming appts with patient. Pt and wife did not have any further questions at this time. Encouraged to call with any further questions or needs. Will follow up with patient again at [place] on 5/29. Pt and wife verbalized understanding. Nothing further needed.            Time Spent with Patient: 60 (09/09/17 1500)

## 2017-09-16 ENCOUNTER — Ambulatory Visit
Admission: RE | Admit: 2017-09-16 | Discharge: 2017-09-16 | Disposition: A | Discharge status: Home / Self Care | Payer: Medicare Other | Source: Ambulatory Visit | Attending: Radiation Oncology | Admitting: Radiation Oncology

## 2017-09-16 DIAGNOSIS — F17210 Nicotine dependence, cigarettes, uncomplicated: Secondary | ICD-10-CM | POA: Diagnosis not present

## 2017-09-16 DIAGNOSIS — C3432 Malignant neoplasm of lower lobe, left bronchus or lung: Secondary | ICD-10-CM | POA: Insufficient documentation

## 2017-09-17 ENCOUNTER — Ambulatory Visit: Payer: Medicare Other

## 2017-09-21 ENCOUNTER — Ambulatory Visit: Payer: Medicare Other | Admitting: Internal Medicine

## 2017-09-23 ENCOUNTER — Telehealth: Payer: Self-pay | Admitting: Cardiovascular Disease

## 2017-09-23 NOTE — Telephone Encounter (Signed)
Patient was told by provider he did not need 6/10 fu appt. Please advise if ok to cancel or patient needs to come in.

## 2017-09-23 NOTE — Telephone Encounter (Signed)
I called and spoke with the patient regarding his appointment on 09/28/17. He states that a nurse at the cancer center said he didn't need to keep his appointment on 6/10 with our office. I advised the patient that per his echo report, Dr. Fletcher Anon had wanted him to follow up in 1 month (echo done 08/27/17) to discuss him mildly reduced EF.   I advised the patient that if he can't keep this appointment or needs to reschedule, then call us back. He states he is due to start taking radiation and this will be on Tuesdays & Thursdays.

## 2017-09-23 NOTE — Telephone Encounter (Signed)
Patient wife returning call  °

## 2017-09-23 NOTE — Telephone Encounter (Signed)
Left a message to call back.

## 2017-09-24 DIAGNOSIS — F17210 Nicotine dependence, cigarettes, uncomplicated: Secondary | ICD-10-CM | POA: Insufficient documentation

## 2017-09-24 DIAGNOSIS — C3432 Malignant neoplasm of lower lobe, left bronchus or lung: Secondary | ICD-10-CM | POA: Diagnosis not present

## 2017-09-24 DIAGNOSIS — Z510 Encounter for antineoplastic radiation therapy: Secondary | ICD-10-CM | POA: Insufficient documentation

## 2017-09-25 NOTE — Progress Notes (Signed)
Cardiology Office Note Date:  09/28/2017  Patient ID:  [name], [name] 15-Jul-1945, MRN [id number] PCP:  Kathrine Haddock, NP  Cardiologist:  Dr. Fletcher Anon, MD    Chief Complaint: Follow up  History of Present Illness: [name] is a 72 y.o. 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     He was initially evaluated by Dr. Fletcher Anon on 08/21/2017 for pre-procedural cardiac evaluation prior to having a bronchoscopy secondary to pulmonary nodules. He previously had a negative work up by outside cardiology group in the remote past for atypical chest pain. He noted intermittent episodes of left-sided chest pain described as sharp as well as stable exertional dyspnea on 5/3. His EKG did not show any ischemic changes other than some PVCs. He underwent Lexiscan Myoview on 5/7 for further risk stratification given his risk factors that showed a small in size, severe, fixed defect at the apex that most likely represented artifact and less likely scar. No significant ischemia was noted. The LVEF was moderately decreased at 30-44%. This was read as an intermediate risk study due to the moderately reduced LVEF with recommendation to correlate with an echo. He underwent TTE on 5/9 that showed an EF of 45-50%, diffuse HK, Gr2DD, mild MR, RVSF normal, mild to moderate TR, PASP 44 mmHg.   Patient underwent bronchoscopy on 5/15 with pathology demonstrating no lesional cells. He has been evaluated by oncology with recommendation to proceed with SBRT of the left lung lesion given he is a high risk patient. Of note, he continued to smoke as of his last oncology visit.   He comes  in doing well from a cardiac perspective.  No symptoms concerning for angina.  No shortness of breath.  He reports today was his last cigarette.  Weight has been stable.  No lower extremity swelling, abdominal fullness, PND, orthopnea, or early satiety.  He does not watch his p.o. fluid intake or salt consumption.  He has a prior history of severe alcohol abuse though has not drank in approximately 15 to 20 years.  He does not check his blood pressure at home.  He is due to start radiation therapy this week.  Past Medical History:  Diagnosis Date  . Anginal pain ([place])   . Cancer (HCC)    laryngeal  . Chronic kidney disease   . COPD (chronic obstructive pulmonary disease) ([place])   . Diabetes mellitus without complication ([place])   . GERD (gastroesophageal reflux disease)   . Hyperlipidemia   . Hypertension   . Lumbago   . Neuropathy   . Tobacco abuse disorder     Past Surgical History:  Procedure Laterality Date  . CHOLECYSTECTOMY    . ELECTROMAGNETIC NAVIGATION BROCHOSCOPY N/A 08/19/2017   Procedure: ELECTROMAGNETIC NAVIGATION BRONCHOSCOPY;  Surgeon: Flora Lipps, MD;  Location: [facility] ORS;  Service: Cardiopulmonary;  Laterality: N/A;  . ELECTROMAGNETIC NAVIGATION BROCHOSCOPY Left 09/02/2017   Procedure: ELECTROMAGNETIC NAVIGATION BRONCHOSCOPY;  Surgeon: Flora Lipps, MD;  Location: [facility] ORS;  Service: Cardiopulmonary;  Laterality: Left;  . THROAT SURGERY      Current Meds  Medication Sig  . allopurinol (ZYLOPRIM) 100 MG tablet TAKE 1 TABLET BY MOUTH ONCE DAILY  . aspirin 81 MG tablet  Take 81 mg by mouth daily.  Marland Kitchen atorvastatin (LIPITOR) 40 MG tablet Take 1 tablet (40 mg total) by mouth daily. (Patient taking differently: Take 40 mg by mouth daily at 6 PM. )  . metFORMIN (GLUCOPHAGE) 500 MG tablet TAKE 1 TABLET BY MOUTH TWICE DAILY WITH A MEAL  . ramipril (ALTACE) 10 MG capsule TAKE 1 CAPSULE BY MOUTH ONCE DAILY  . SPIRIVA HANDIHALER 18 MCG inhalation capsule PLACE 1 CAPSULE INTO INHALER  AND INHALE DAILY (Patient taking differently: PLACE 1 CAPSULE INTO INHALER AND INHALE DAILY in the afternoon)  . [DISCONTINUED] metoprolol tartrate (LOPRESSOR) 25 MG tablet TAKE 1 TABLET BY MOUTH TWICE DAILY    Allergies:   Patient has no known allergies.   Social History:  The patient  reports that he has been smoking cigarettes.  He has a 31.80 pack-year smoking history. He has never used smokeless tobacco. He reports that he does not drink alcohol or use drugs.   Family History:  The patient's family history includes Brain cancer in his sister; Cervical cancer in his sister; Diabetes in his sister; Heart attack in his father; Heart disease in his father; Lung cancer in his brother, brother, brother, and brother; Lupus in his sister; Rectal cancer in his brother; Stomach cancer in his sister and sister.  ROS:   Review of Systems  Constitutional: Positive for weight loss. Negative for chills, diaphoresis, fever and malaise/fatigue.  HENT: Negative for congestion.   Eyes: Negative for discharge and redness.  Respiratory: Negative for cough, hemoptysis, sputum production, shortness of breath and wheezing.   Cardiovascular: Negative for chest pain, palpitations, orthopnea, claudication, leg swelling and PND.  Gastrointestinal: Negative for abdominal pain, blood in stool, heartburn, melena, nausea and vomiting.  Genitourinary: Negative for hematuria.  Musculoskeletal: Negative for falls and myalgias.  Skin: Negative for rash.  Neurological: Negative for dizziness, tingling, tremors, sensory change, speech change, focal weakness, loss of consciousness and weakness.  Endo/Heme/Allergies: Does not bruise/bleed easily.  Psychiatric/Behavioral: Negative for substance abuse. The patient is not nervous/anxious.   All other systems reviewed and are negative.    PHYSICAL EXAM:  VS:  BP 106/60 (BP Location: Left Arm, Patient Position: Sitting, Cuff Size: Normal)   Pulse 64   Ht 5\' 9"  (1.753 m)   Wt  174 lb (78.9 kg)   BMI 25.70 kg/m  BMI: Body mass index is 25.7 kg/m.  Physical Exam  Constitutional: He is oriented to person, place, and time. He appears well-developed and well-nourished.  HENT:  Head: Normocephalic and atraumatic.  Eyes: Right eye exhibits no discharge. Left eye exhibits no discharge.  Neck: Normal range of motion. No JVD present.  Cardiovascular: Normal rate, regular rhythm, S1 normal, S2 normal and normal heart sounds. Exam reveals no distant heart sounds, no friction rub, no midsystolic click and no opening snap.  No murmur heard. Pulses:      Carotid pulses are on the left side with bruit.      Posterior tibial pulses are 2+ on the right side, and 2+ on the left side.  Pulmonary/Chest: Effort normal. No respiratory distress. He has decreased breath sounds. He has no wheezes. He has no rales. He exhibits no tenderness.  Abdominal: Soft. He exhibits no distension. There is no tenderness.  Musculoskeletal: He exhibits no edema.  Neurological: He is alert and oriented to person, place, and time.  Skin: Skin is warm and dry. No cyanosis. Nails show no clubbing.  Psychiatric: He has a normal mood and affect.  His speech is normal and behavior is normal. Judgment and thought content normal.     EKG:  Was not ordered today.   Recent Labs: 07/17/2017: Platelets 166; TSH 2.680 08/14/2017: ALT 9; BUN 18; Creatinine, Ser 1.03 08/19/2017: Hemoglobin 14.6; Potassium 4.4; Sodium 141  08/14/2017: Cholesterol, Total 152; HDL 43; LDL Calculated 91; Triglycerides 90   CrCl cannot be calculated (Patient's most recent lab result is older than the maximum 21 days allowed.).   Wt Readings from Last 3 Encounters:  09/28/17 174 lb (78.9 kg)  09/09/17 176 lb 11.2 oz (80.2 kg)  09/09/17 176 lb 8 oz (80.1 kg)     Other studies reviewed: Additional studies/records reviewed today include: summarized above  ASSESSMENT AND PLAN:  1. Chronic systolic CHF/pulmonary hypertension: He  does not appear grossly volume overloaded at this time.  Recent Myoview negative for ischemia.  His systolic CHF is likely secondary to nonischemic cardiomyopathy in the setting of prior alcohol abuse as well as poorly controlled hypertension.  His pulmonary hypertension is likely secondary to underlying COPD secondary to long history of tobacco abuse.  Change metoprolol tartrate to metoprolol succinate 25 mg daily.  Continue ramipril 10 mg daily.  Follow-up RN visit in 1 week to evaluate blood pressure.  At that time, consider addition of spironolactone.  CHF education discussed in detail.  No current indication for standing diuretic therapy.  2. Mitral regurgitation: Asymptomatic.  Continue to monitor clinically with periodic echocardiogram.  3. Carotid bruit: Recent carotid artery ultrasound showing mild nonobstructive disease.  4. PAD: Followed by [facility].  Continue ASA.  Stable.  5. Hyperlipidemia: Continue Lipitor 40 mg daily.  Goal LDL less than 70.  6. Hypertension: Blood pressure well controlled today.  Change Lopressor to Toprol as above.  Continue ramipril.  Disposition: F/u with Dr. Fletcher Anon in 3 months.   Current medicines are reviewed at length with the patient today.  The patient did not have any concerns regarding medicines.  Signed, Christell Faith, PA-C 09/28/2017 2:08 PM     [address] [place], [place] [address] [phone]

## 2017-09-28 ENCOUNTER — Encounter: Payer: Self-pay | Admitting: Physician Assistant

## 2017-09-28 ENCOUNTER — Ambulatory Visit (INDEPENDENT_AMBULATORY_CARE_PROVIDER_SITE_OTHER): Payer: Medicare Other | Admitting: Physician Assistant

## 2017-09-28 VITALS — BP 106/60 | HR 64 | Ht 69.0 in | Wt 174.0 lb

## 2017-09-28 DIAGNOSIS — I10 Essential (primary) hypertension: Secondary | ICD-10-CM

## 2017-09-28 DIAGNOSIS — E785 Hyperlipidemia, unspecified: Secondary | ICD-10-CM

## 2017-09-28 DIAGNOSIS — I5022 Chronic systolic (congestive) heart failure: Secondary | ICD-10-CM

## 2017-09-28 DIAGNOSIS — I739 Peripheral vascular disease, unspecified: Secondary | ICD-10-CM | POA: Diagnosis not present

## 2017-09-28 DIAGNOSIS — I340 Nonrheumatic mitral (valve) insufficiency: Secondary | ICD-10-CM

## 2017-09-28 DIAGNOSIS — R0989 Other specified symptoms and signs involving the circulatory and respiratory systems: Secondary | ICD-10-CM

## 2017-09-28 DIAGNOSIS — I272 Pulmonary hypertension, unspecified: Secondary | ICD-10-CM

## 2017-09-28 MED ORDER — METOPROLOL SUCCINATE ER 25 MG PO TB24: 25.0000 mg | ORAL_TABLET | Freq: Every day | ORAL | 3 refills | Status: DC

## 2017-09-28 NOTE — Patient Instructions (Signed)
Medication Instructions:  Your physician has recommended you make the following change in your medication:  1- Stop Metoprolol tartrate. 2- START Toprol 25 mg (1 tablet) by mouth once a day.   Labwork: none  Testing/Procedures: none  Follow-Up: Your physician recommends that you schedule a follow-up appointment in: [place].  Your physician recommends that you schedule a follow-up appointment in: [place].  If you need a refill on your cardiac medications before your next appointment, please call your pharmacy.

## 2017-09-29 ENCOUNTER — Ambulatory Visit
Admission: RE | Admit: 2017-09-29 | Discharge: 2017-09-29 | Disposition: A | Discharge status: Home / Self Care | Payer: Medicare Other | Source: Ambulatory Visit | Attending: Radiation Oncology | Admitting: Radiation Oncology

## 2017-09-29 DIAGNOSIS — C3432 Malignant neoplasm of lower lobe, left bronchus or lung: Secondary | ICD-10-CM | POA: Diagnosis not present

## 2017-09-29 DIAGNOSIS — Z510 Encounter for antineoplastic radiation therapy: Secondary | ICD-10-CM | POA: Diagnosis not present

## 2017-10-01 ENCOUNTER — Ambulatory Visit
Admission: RE | Admit: 2017-10-01 | Discharge: 2017-10-01 | Disposition: A | Discharge status: Home / Self Care | Payer: Medicare Other | Source: Ambulatory Visit | Attending: Radiation Oncology | Admitting: Radiation Oncology

## 2017-10-01 DIAGNOSIS — C3432 Malignant neoplasm of lower lobe, left bronchus or lung: Secondary | ICD-10-CM | POA: Diagnosis not present

## 2017-10-01 DIAGNOSIS — Z510 Encounter for antineoplastic radiation therapy: Secondary | ICD-10-CM | POA: Diagnosis not present

## 2017-10-06 ENCOUNTER — Ambulatory Visit (INDEPENDENT_AMBULATORY_CARE_PROVIDER_SITE_OTHER): Payer: Medicare Other | Admitting: *Deleted

## 2017-10-06 ENCOUNTER — Ambulatory Visit
Admission: RE | Admit: 2017-10-06 | Discharge: 2017-10-06 | Disposition: A | Discharge status: Home / Self Care | Payer: Medicare Other | Source: Ambulatory Visit | Attending: Radiation Oncology | Admitting: Radiation Oncology

## 2017-10-06 VITALS — BP 134/70 | HR 60 | Ht 69.0 in | Wt 176.0 lb

## 2017-10-06 DIAGNOSIS — Z510 Encounter for antineoplastic radiation therapy: Secondary | ICD-10-CM | POA: Diagnosis not present

## 2017-10-06 DIAGNOSIS — C3432 Malignant neoplasm of lower lobe, left bronchus or lung: Secondary | ICD-10-CM | POA: Diagnosis not present

## 2017-10-06 DIAGNOSIS — I10 Essential (primary) hypertension: Secondary | ICD-10-CM

## 2017-10-06 NOTE — Patient Instructions (Signed)
Medication Instructions: - no changes   Labwork: - none ordered  Procedures/Testing: - none ordered  Follow-Up: - as scheduled  Any Additional Special Instructions Will Be Listed Below (If Applicable).     If you need a refill on your cardiac medications before your next appointment, please call your pharmacy.

## 2017-10-06 NOTE — Progress Notes (Signed)
1.) Reason for visit: BP check  2.) Name of MD requesting visit: Christell Faith, PA  3.) H&P: The patient was recently seen by Thurmond Butts, [place] on 09/28/17 for follow up of recently diagnosed chronic systolic heart failure/ cardiomyopathy/ pulmonary HTN. At his last office visit, metoprolol tartrate was changed to metoprolol succinate 25 mg once daily. The patient confirms today that he is taking this and did have all of his medications prior to coming in for his BP check today.   4.) ROS related to problem: The patient reports that he is feeling well today. He is scheduled for radiation today after his BP check here. He states that he quit smoking "cold [place]" after his office visit with Thurmond Butts on 09/28/17 and feels he is doing well with this  5.) Assessment and plan per MD: The patient is aware that his BP results from today will be sent to [place], [place] for review as the plan was to potentially start spironolactone for his CHF/ pulmonary HTN. I have advised the patient that it will be later in the week before we touch base with him with further recommendations as Thurmond Butts is currently out of the office. The patient is agreeable and voices understanding.

## 2017-10-07 ENCOUNTER — Telehealth: Payer: Self-pay | Admitting: Nurse Practitioner

## 2017-10-07 DIAGNOSIS — I10 Essential (primary) hypertension: Secondary | ICD-10-CM

## 2017-10-07 DIAGNOSIS — Z79899 Other long term (current) drug therapy: Secondary | ICD-10-CM

## 2017-10-07 NOTE — Telephone Encounter (Signed)
1. Reason for visit: BP check  2. Name of MD requesting visit: Christell Faith, PA  3. H&P: The patient was recently seen by Thurmond Butts, [place] on 09/28/17 for follow up of recently diagnosed chronic systolic heart failure/ cardiomyopathy/ pulmonary HTN. At his last office visit, metoprolol tartrate was changed to metoprolol succinate 25 mg once daily. The patient confirms today that he is taking this and did have all of his medications prior to coming in for his BP check today.   4. ROS related to problem: The patient reports that he is feeling well today. He is scheduled for radiation today after his BP check here. He states that he quit smoking "cold [place]" after his office visit with Thurmond Butts on 09/28/17 and feels he is doing well with this  5. Assessment and plan per MD: The patient is aware that his BP results from today will be sent to [facility], [place] for review as the plan was to potentially start spironolactone for his CHF/ pulmonary HTN. I have advised the patient that it will be later in the week before we touch base with him with further recommendations as Thurmond Butts is currently out of the office. The patient is agreeable and voices understanding.  **BP looks good @ 134/70.  As planned, please add spironolactone 12.5 mg daily. F/u bmet in 1 wk after starting - Murray Hodgkins, NP 10/07/2017 5:23 PM

## 2017-10-08 ENCOUNTER — Ambulatory Visit
Admission: RE | Admit: 2017-10-08 | Discharge: 2017-10-08 | Disposition: A | Discharge status: Home / Self Care | Payer: Medicare Other | Source: Ambulatory Visit | Attending: Radiation Oncology | Admitting: Radiation Oncology

## 2017-10-08 ENCOUNTER — Other Ambulatory Visit: Payer: Self-pay | Admitting: Family Medicine

## 2017-10-08 DIAGNOSIS — C3432 Malignant neoplasm of lower lobe, left bronchus or lung: Secondary | ICD-10-CM | POA: Diagnosis not present

## 2017-10-08 DIAGNOSIS — Z510 Encounter for antineoplastic radiation therapy: Secondary | ICD-10-CM | POA: Diagnosis not present

## 2017-10-08 MED ORDER — SPIRONOLACTONE 25 MG PO TABS: 12.5000 mg | ORAL_TABLET | Freq: Every day | ORAL | 3 refills | Status: DC

## 2017-10-08 NOTE — Telephone Encounter (Signed)
Patient verbalized understanding to start spironolactone 12.5 mg (0.5 tablet) by mouth once a day [facility] for [facility] work. BMET entered and Rx sent to pharmacy.

## 2017-10-13 ENCOUNTER — Ambulatory Visit
Admission: RE | Admit: 2017-10-13 | Discharge: 2017-10-13 | Disposition: A | Discharge status: Home / Self Care | Payer: Medicare Other | Source: Ambulatory Visit | Attending: Radiation Oncology | Admitting: Radiation Oncology

## 2017-10-13 ENCOUNTER — Encounter: Payer: Self-pay | Admitting: *Deleted

## 2017-10-13 DIAGNOSIS — Z510 Encounter for antineoplastic radiation therapy: Secondary | ICD-10-CM | POA: Diagnosis not present

## 2017-10-13 DIAGNOSIS — C3432 Malignant neoplasm of lower lobe, left bronchus or lung: Secondary | ICD-10-CM | POA: Diagnosis not present

## 2017-10-14 ENCOUNTER — Other Ambulatory Visit
Admission: RE | Admit: 2017-10-14 | Discharge: 2017-10-14 | Disposition: A | Discharge status: Home / Self Care | Payer: Medicare Other | Source: Ambulatory Visit | Attending: Nurse Practitioner | Admitting: Nurse Practitioner

## 2017-10-14 ENCOUNTER — Other Ambulatory Visit: Payer: Self-pay | Admitting: *Deleted

## 2017-10-14 DIAGNOSIS — Z79899 Other long term (current) drug therapy: Secondary | ICD-10-CM

## 2017-10-14 DIAGNOSIS — I5022 Chronic systolic (congestive) heart failure: Secondary | ICD-10-CM

## 2017-10-14 DIAGNOSIS — I10 Essential (primary) hypertension: Secondary | ICD-10-CM | POA: Insufficient documentation

## 2017-10-14 LAB — BASIC METABOLIC PANEL
Anion gap: 7 (ref 5–15)
BUN: 17 mg/dL (ref 8–23)
CALCIUM: 8.6 mg/dL — AB (ref 8.9–10.3)
CO2: 28 mmol/L (ref 22–32)
CREATININE: 1.14 mg/dL (ref 0.61–1.24)
Chloride: 104 mmol/L (ref 98–111)
GFR calc Af Amer: >60 mL/min (ref >60)
GFR calc non Af Amer: >60 mL/min (ref >60)
GLUCOSE: 98 mg/dL (ref 70–99)
Potassium: 4 mmol/L (ref 3.5–5.1)
Sodium: 139 mmol/L (ref 135–145)

## 2017-10-21 ENCOUNTER — Other Ambulatory Visit: Payer: Self-pay | Admitting: *Deleted

## 2017-10-21 DIAGNOSIS — I6529 Occlusion and stenosis of unspecified carotid artery: Secondary | ICD-10-CM

## 2017-10-28 ENCOUNTER — Other Ambulatory Visit
Admission: RE | Admit: 2017-10-28 | Discharge: 2017-10-28 | Disposition: A | Discharge status: Home / Self Care | Payer: Medicare Other | Source: Ambulatory Visit | Attending: Nurse Practitioner | Admitting: Nurse Practitioner

## 2017-10-28 ENCOUNTER — Other Ambulatory Visit: Payer: Self-pay | Admitting: *Deleted

## 2017-10-28 DIAGNOSIS — I5022 Chronic systolic (congestive) heart failure: Secondary | ICD-10-CM | POA: Diagnosis not present

## 2017-10-28 DIAGNOSIS — E875 Hyperkalemia: Secondary | ICD-10-CM

## 2017-10-28 DIAGNOSIS — Z79899 Other long term (current) drug therapy: Secondary | ICD-10-CM | POA: Diagnosis not present

## 2017-10-28 LAB — BASIC METABOLIC PANEL
Anion gap: 6 (ref 5–15)
BUN: 19 mg/dL (ref 8–23)
CO2: 30 mmol/L (ref 22–32)
CREATININE: 1.31 mg/dL — AB (ref 0.61–1.24)
Calcium: 9.3 mg/dL (ref 8.9–10.3)
Chloride: 105 mmol/L (ref 98–111)
GFR, EST NON AFRICAN AMERICAN: 53 mL/min — AB (ref >60)
Glucose, Bld: 109 mg/dL — ABNORMAL HIGH (ref 70–99)
POTASSIUM: 5.8 mmol/L — AB (ref 3.5–5.1)
SODIUM: 141 mmol/L (ref 135–145)

## 2017-10-29 ENCOUNTER — Other Ambulatory Visit: Payer: Self-pay | Admitting: *Deleted

## 2017-10-29 ENCOUNTER — Other Ambulatory Visit
Admission: RE | Admit: 2017-10-29 | Discharge: 2017-10-29 | Disposition: A | Discharge status: Home / Self Care | Payer: Medicare Other | Source: Ambulatory Visit | Attending: Nurse Practitioner | Admitting: Nurse Practitioner

## 2017-10-29 DIAGNOSIS — Z79899 Other long term (current) drug therapy: Secondary | ICD-10-CM

## 2017-10-29 DIAGNOSIS — E875 Hyperkalemia: Secondary | ICD-10-CM | POA: Insufficient documentation

## 2017-10-29 DIAGNOSIS — N183 Chronic kidney disease, stage 3 unspecified: Secondary | ICD-10-CM

## 2017-10-29 DIAGNOSIS — I10 Essential (primary) hypertension: Secondary | ICD-10-CM

## 2017-10-29 LAB — BASIC METABOLIC PANEL
Anion gap: 6 (ref 5–15)
BUN: 21 mg/dL (ref 8–23)
CHLORIDE: 106 mmol/L (ref 98–111)
CO2: 29 mmol/L (ref 22–32)
CREATININE: 1.2 mg/dL (ref 0.61–1.24)
Calcium: 9.2 mg/dL (ref 8.9–10.3)
GFR calc Af Amer: >60 mL/min (ref >60)
GFR calc non Af Amer: 59 mL/min — ABNORMAL LOW (ref >60)
GLUCOSE: 107 mg/dL — AB (ref 70–99)
Potassium: 5 mmol/L (ref 3.5–5.1)
SODIUM: 141 mmol/L (ref 135–145)

## 2017-11-03 ENCOUNTER — Other Ambulatory Visit
Admission: RE | Admit: 2017-11-03 | Discharge: 2017-11-03 | Disposition: A | Discharge status: Home / Self Care | Payer: Medicare Other | Source: Ambulatory Visit | Attending: Nurse Practitioner | Admitting: Nurse Practitioner

## 2017-11-03 DIAGNOSIS — Z79899 Other long term (current) drug therapy: Secondary | ICD-10-CM

## 2017-11-03 DIAGNOSIS — N183 Chronic kidney disease, stage 3 unspecified: Secondary | ICD-10-CM

## 2017-11-03 DIAGNOSIS — I10 Essential (primary) hypertension: Secondary | ICD-10-CM

## 2017-11-03 LAB — BASIC METABOLIC PANEL
ANION GAP: 7 (ref 5–15)
BUN: 21 mg/dL (ref 8–23)
CHLORIDE: 107 mmol/L (ref 98–111)
CO2: 27 mmol/L (ref 22–32)
Calcium: 8.9 mg/dL (ref 8.9–10.3)
Creatinine, Ser: 1.11 mg/dL (ref 0.61–1.24)
Glucose, Bld: 123 mg/dL — ABNORMAL HIGH (ref 70–99)
POTASSIUM: 4.6 mmol/L (ref 3.5–5.1)
SODIUM: 141 mmol/L (ref 135–145)

## 2017-11-23 ENCOUNTER — Ambulatory Visit: Admission: RE | Admit: 2017-11-23 | Payer: Medicare Other | Source: Ambulatory Visit

## 2017-11-23 ENCOUNTER — Telehealth: Payer: Self-pay | Admitting: *Deleted

## 2017-11-23 NOTE — Telephone Encounter (Signed)
Patient was a no show for his CT Chest this morning, She attempted to reach him and was unsuccessful.

## 2017-11-25 ENCOUNTER — Ambulatory Visit
Admission: RE | Admit: 2017-11-25 | Discharge: 2017-11-25 | Disposition: A | Discharge status: Home / Self Care | Payer: Medicare Other | Source: Ambulatory Visit | Attending: Oncology | Admitting: Oncology

## 2017-11-25 DIAGNOSIS — I700 Atherosclerosis of aorta: Secondary | ICD-10-CM | POA: Diagnosis not present

## 2017-11-25 DIAGNOSIS — J432 Centrilobular emphysema: Secondary | ICD-10-CM | POA: Insufficient documentation

## 2017-11-25 DIAGNOSIS — R918 Other nonspecific abnormal finding of lung field: Secondary | ICD-10-CM | POA: Diagnosis not present

## 2017-11-25 DIAGNOSIS — C3490 Malignant neoplasm of unspecified part of unspecified bronchus or lung: Secondary | ICD-10-CM | POA: Diagnosis not present

## 2017-11-25 IMAGING — CT CT CHEST W/O CM
1 series · 15 of 34 positions shown, 19 images · non-contrast
Comparison: PET-CT dated [DATE].  CT chest dated [DATE].

ADDENDUM:
Dictation error in the impression of the original report. The 10 x
14 mm cavitary nodule is located in the right UPPER lobe (as noted
in the findings), not the right lower lobe.

Also, for clarification:
When compared to the prior, if considering the lesion a cystic/solid
nodule (rather than simply measuring the solid component), the
overall lesion size is unchanged. However, the internal solid
component has decreased in size, now measuring 3 x 5 mm, previously
7 x 9 mm.
This was discussed with Dr NIVIRUS at the time of addendum.
CLINICAL DATA: Lung cancer, status post radiation
EXAM:
CT CHEST WITHOUT CONTRAST
TECHNIQUE: Multidetector CT imaging of the chest was performed following the
standard protocol without IV contrast.

[Series 2: thorax · axial · 0.74mm/px · z∈[-536,-258]mm · 15 of 165 slices shown, 19 images]
[im 13/165  mediastinal]
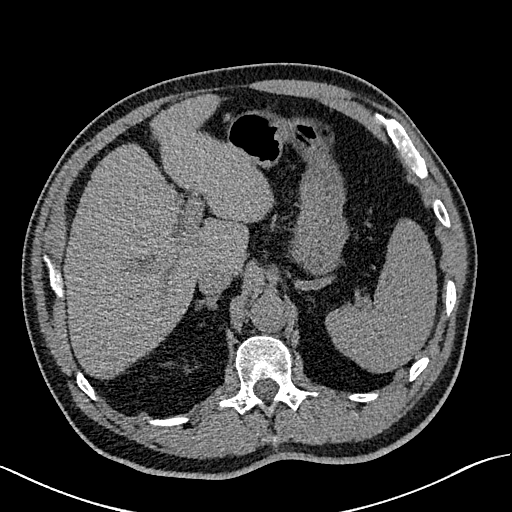
[im 13/165  lung]
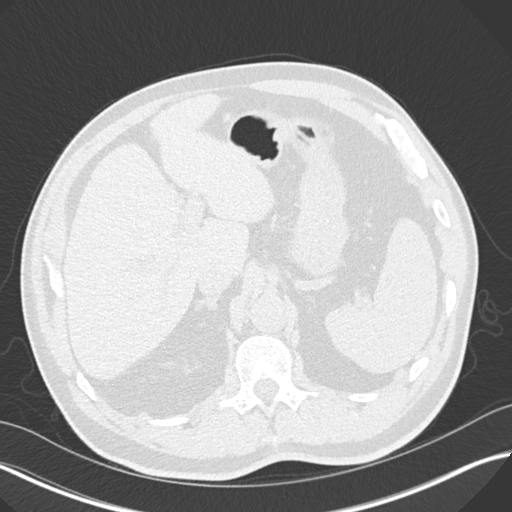
[im 25/165  lung]
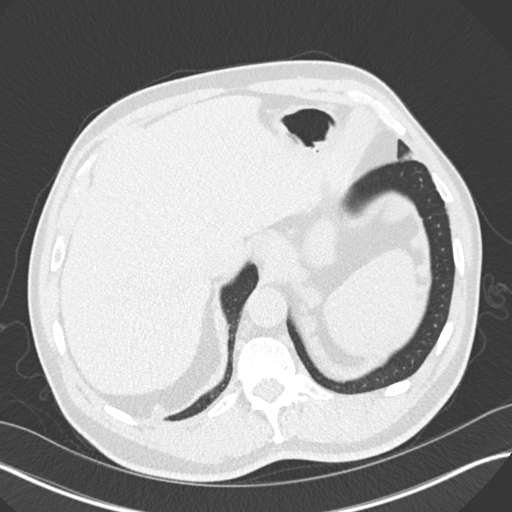
[im 33/165  lung]
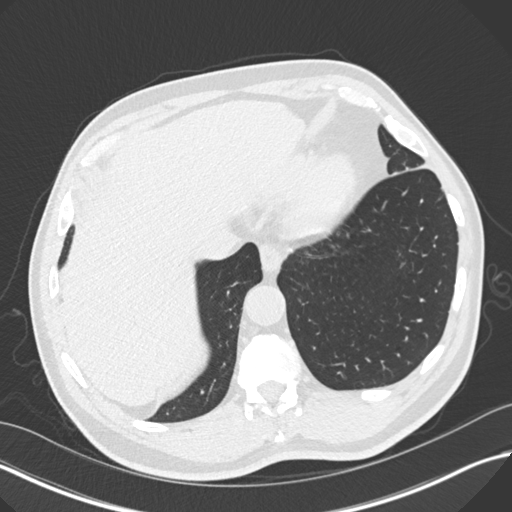
[im 43/165  lung]
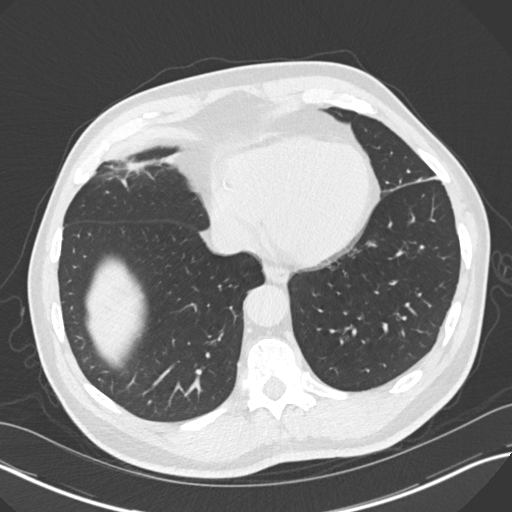
[im 55/165  mediastinal]
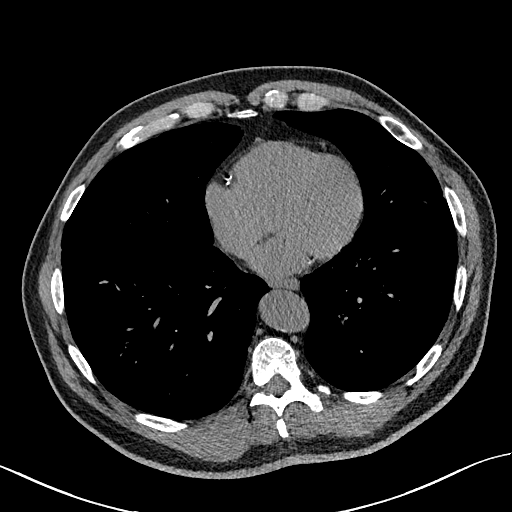
[im 55/165  lung]
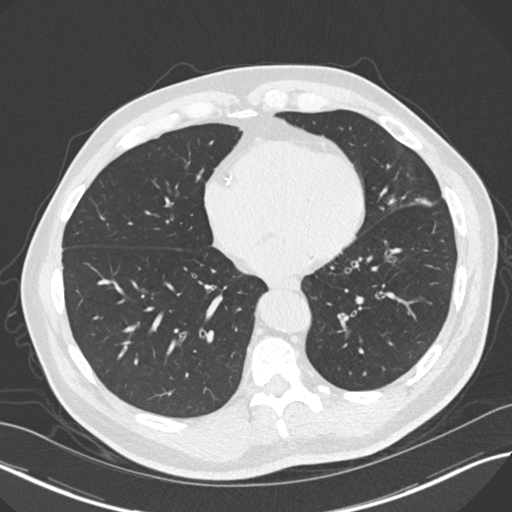
[im 66/165  lung]
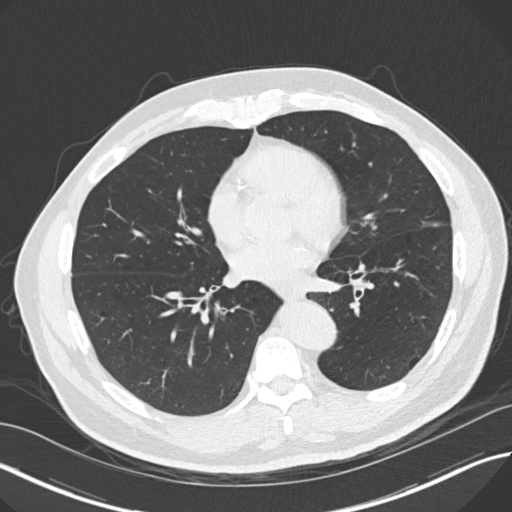
[im 73/165  lung]
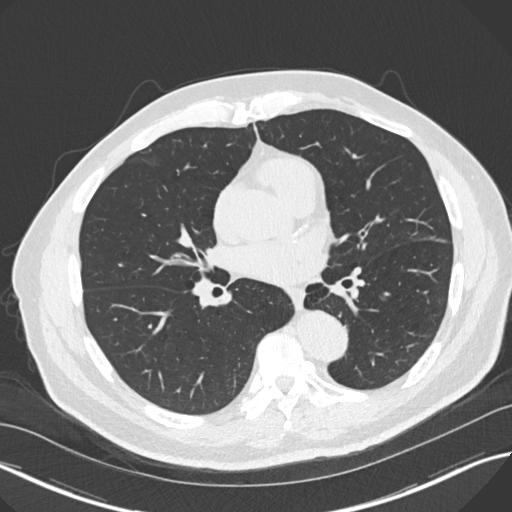
[im 86/165  lung]
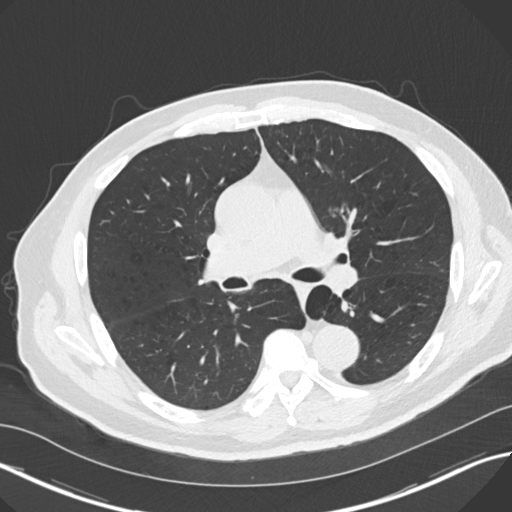
[im 92/165  mediastinal]
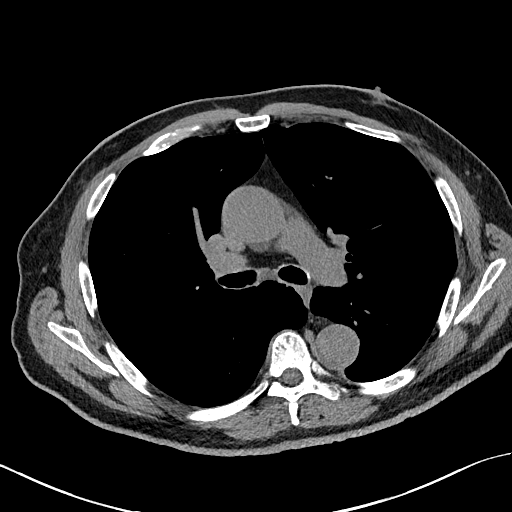
[im 92/165  lung]
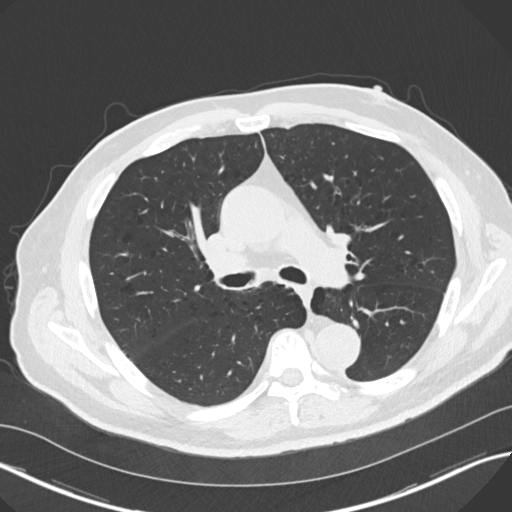
[im 99/165  lung]
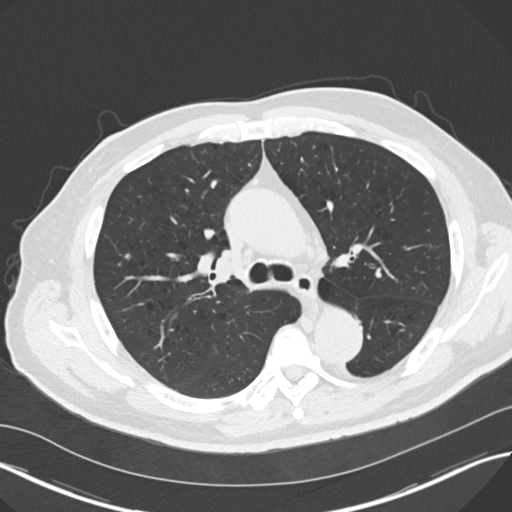
[im 110/165  lung]
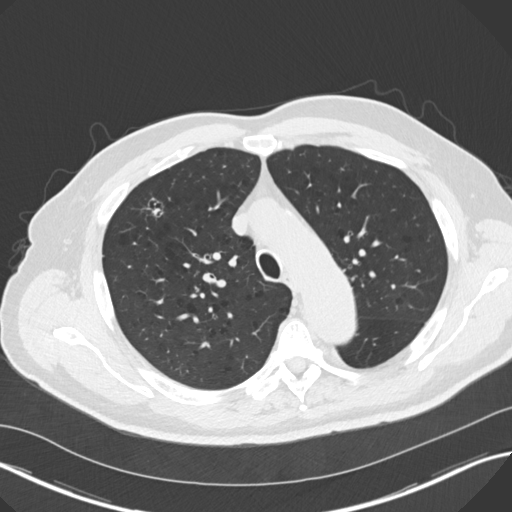
[im 122/165  lung]
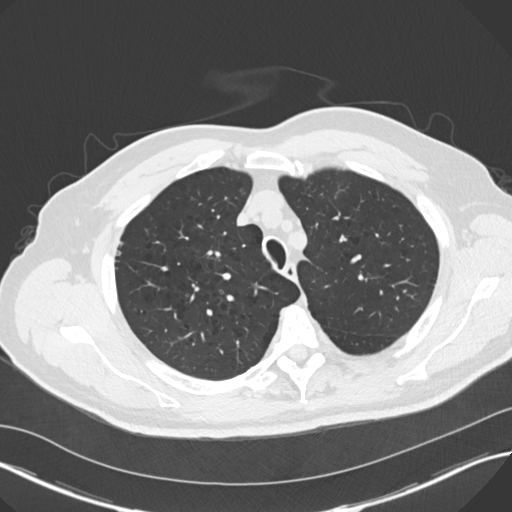
[im 132/165  mediastinal]
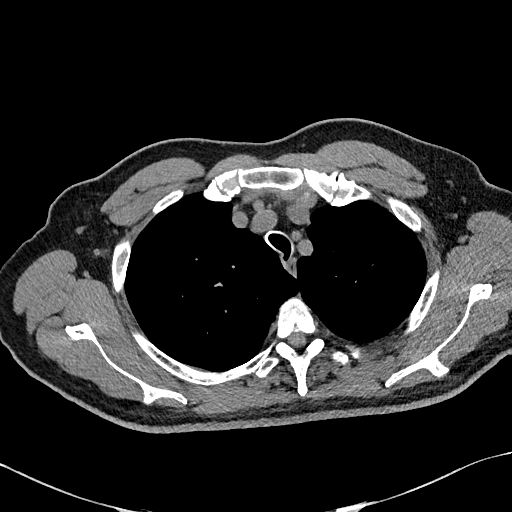
[im 132/165  lung]
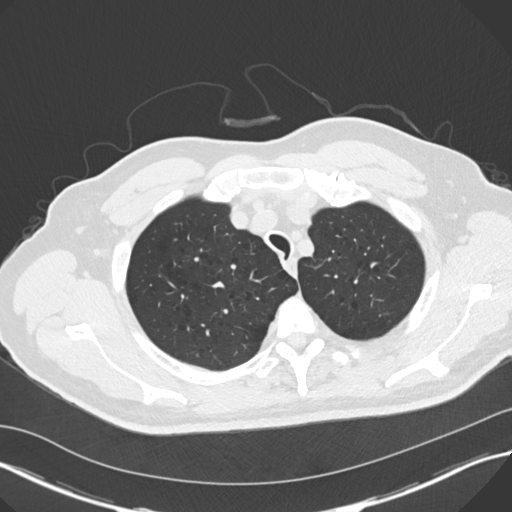
[im 140/165  lung]
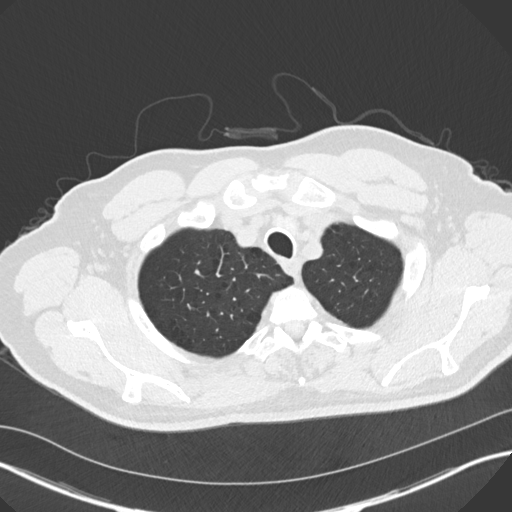
[im 152/165  lung]
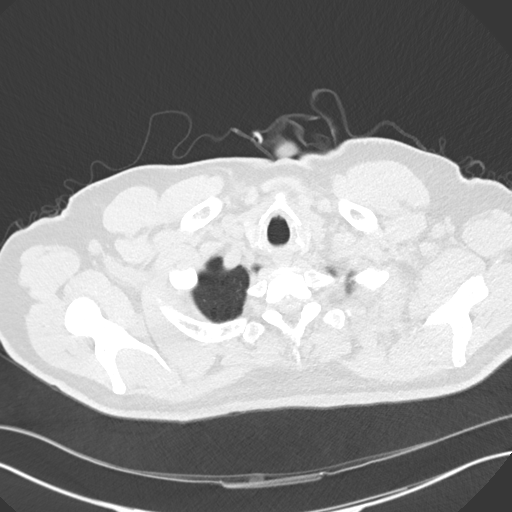

[15 of 34 positions shown; findings below may reference images not displayed]

FINDINGS: Cardiovascular: The heart is normal in size. No pericardial
effusion.

No evidence of thoracic aortic aneurysm. Mild atherosclerotic
calcifications of the aortic arch.

Three vessel coronary atherosclerosis.

Mediastinum/Nodes: Small mediastinal lymph nodes, including a 7 mm
short axis subcarinal node, within normal limits.

Visualized thyroid is unremarkable.

Lungs/Pleura: 10 x 11 mm irregular left lower lobe nodule (series
3/image 116), corresponding to suspected primary bronchogenic
neoplasm, previously 14 x 15 mm.

10 x 14 mm cavitary nodule in the right upper lobe (series 3/image
56), unchanged, but with decreasing 3 x 5 mm solid component,
previously 7 x 9 mm.

Possible endobronchial nodule in the central right upper lobe
(series 3/image 54) is not well visualized on the current CT.

Underlying mild centrilobular emphysematous changes.

No focal consolidation.

No pleural effusion or pneumothorax.

Upper Abdomen: Visualized upper abdomen is notable for prior
cholecystectomy and right renal cortical scarring/atrophy.

Musculoskeletal: Visualized osseous structures are within normal
limits.
IMPRESSION: 10 x 11 mm left lower lobe pulmonary nodule, decreased.

10 x 14 mm cavitary nodule in the right lower lobe with decreasing
solid component, as above.

Aortic Atherosclerosis ([EQ]-[EQ]) and Emphysema ([EQ]-[EQ]).

## 2017-11-26 ENCOUNTER — Inpatient Hospital Stay: Payer: Medicare Other | Admitting: Oncology

## 2017-11-27 ENCOUNTER — Inpatient Hospital Stay: Payer: Medicare Other | Attending: Oncology | Admitting: Oncology

## 2017-11-27 ENCOUNTER — Other Ambulatory Visit: Payer: Self-pay

## 2017-11-27 ENCOUNTER — Encounter: Payer: Self-pay | Admitting: Oncology

## 2017-11-27 VITALS — BP 130/64 | HR 57 | Temp 95.7°F | Wt 181.4 lb

## 2017-11-27 DIAGNOSIS — R918 Other nonspecific abnormal finding of lung field: Secondary | ICD-10-CM | POA: Diagnosis not present

## 2017-11-27 DIAGNOSIS — Z8521 Personal history of malignant neoplasm of larynx: Secondary | ICD-10-CM | POA: Insufficient documentation

## 2017-11-27 DIAGNOSIS — C329 Malignant neoplasm of larynx, unspecified: Secondary | ICD-10-CM

## 2017-11-27 DIAGNOSIS — Z720 Tobacco use: Secondary | ICD-10-CM

## 2017-11-27 NOTE — Progress Notes (Signed)
Patient here today for follow up.  Patient states no new concerns today  

## 2017-11-27 NOTE — Progress Notes (Signed)
Hematology/Oncology Follow up note [facility] [phone] [phone]   Patient Care Team: Kathrine Haddock, NP as PCP - General (Nurse Practitioner) Beverly Gust, MD (Otolaryngology) Telford Nab, RN as Registered Nurse  REFERRING PROVIDER: Kathrine Haddock, NP CHIEF COMPLAINTS/PURPOSE OF CONSULTATION:  Evaluation of lung nodule  HISTORY OF PRESENTING ILLNESS:  Shown Philip Wood is a  72 y.o.  male with PMH listed below who was referred to me for evaluation of lung nodule.  Patient follows up with primary care physician and reports significant unintentional weight loss for the past couple of months.   he has history of 31 pack a day smoking history, current smoker so he was referred to lung cancer screening clinic and had a CT chest scan done.  Image Study  CT chest wo 07/21/2017  1. Right upper and left lower lobe nodules, worrisome for synchronous bronchogenic carcinomas. Lung-RADS 4B, suspicious. Additional imaging evaluation or consultation with Pulmonology or Thoracic Surgery recommended. These results will be called to the ordering clinician or representative by the Radiologist Assistant, and communication documented in the PACS or zVision Dashboard. 2. Aortic atherosclerosis (ICD10-170.0). Coronary artery calcification. 3. Ascending aortic aneurysm NOS (ICD10-I71.9). Recommend annual imaging followup by CTA or MRA. This recommendation follows 2010 ACCF/AHA/AATS/ACR/ASA/SCA/SCAI/SIR/STS/SVM Guidelines for the Diagnosis and Management of Patients with Thoracic Aortic Disease. Circulation. 2010; 121: B510-C585. 4.  Emphysema (ICD10-J43.9).  PET scan 07/29/2017  1. The 1.5 cm left lower lobe nodule and the 1.0 by 0.7 cm right upper lobe solid nodule are both hypermetabolic and suspicious for malignancy. In addition, there a 5 mm peribronchovascular nodule in the right upper lobe with an SUV which, although only 1.6, is disproportionate to  the small size of the lesion and accordingly concerning for malignancy. No abnormal hypermetabolic activity in the neck, abdomen/pelvis, or skeleton is identified. No hypermetabolic adenopathy in the chest. 2. Other imaging findings of potential clinical significance: Small mucous retention cyst in the right maxillary sinus. Aortic Atherosclerosis (ICD10-I70.0) and Emphysema (ICD10-J43.9). Coronary atherosclerosis. Prostatomegaly. Prominent stool throughout the colon favors constipation. Descending and proximal sigmoid colon diverticulosis.  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  He got definitive radiation to the larynx, and the patient has been doing annual checkup with ENT Dr. Tami Ribas.  Patient's case was discussed on tumor conference on 07/30/2017 and consensus recommendation is to proceed with ENB biopsy of both lung nodule. Patient present to clinic today accompanied by his wife.  He denies any shortness of breath, chest pain, abdominal pain, hemoptysis, headache   Or double vision.  He continues to smoke half a pack of cigarettes a day  # Pathology of biopsies showed no lesional cells identified. However he is a high risk patient and negative biopsy results do not exclude malignancy.  Discussed with RadOnc Dr.Chrystal. Patient underwent SBRT to the left lung lesion.    INTERVAL HISTORY [name] is a 72 y.o. male who has above history reviewed by me today presents for follow-up visit for management of low nausea.  During the interval patient has underwent SBRT radiation to the left lung nodule. He has had CT scan during the interval.  Presents to discuss about image results. Reports doing really well.  Denies any cough, chest pain, weight loss fever or chills.   Review  of  Systems  Constitutional: Negative for chills, fever, malaise/fatigue and weight loss.  HENT: Negative for congestion, ear discharge, ear pain, nosebleeds, sinus pain and sore throat.   Eyes: Negative for double vision, photophobia, pain, discharge and redness.  Respiratory: Positive for cough. Negative for hemoptysis, sputum production, shortness of breath and wheezing.   Cardiovascular: Negative for chest pain, palpitations, orthopnea, claudication and leg swelling.  Gastrointestinal: Negative for abdominal pain, blood in stool, constipation, diarrhea, heartburn, melena, nausea and vomiting.  Genitourinary: Negative for dysuria, flank pain, frequency, hematuria and urgency.  Musculoskeletal: Negative for back pain, myalgias and neck pain.  Skin: Negative for itching and rash.  Neurological: Negative for dizziness, tingling, tremors, speech change, focal weakness, weakness and headaches.  Endo/Heme/Allergies: Negative for environmental allergies. Does not bruise/bleed easily.  Psychiatric/Behavioral: Negative for depression and hallucinations. The patient is not nervous/anxious.     MEDICAL HISTORY:  Past Medical History:  Diagnosis Date  . Anginal pain ([place])   . Cancer (HCC)    laryngeal  . Chronic kidney disease   . COPD (chronic obstructive pulmonary disease) ([place])   . Diabetes mellitus without complication ([place])   . GERD (gastroesophageal reflux disease)   . Hyperlipidemia   . Hypertension   . Lumbago   . Neuropathy   . Tobacco abuse disorder     SURGICAL HISTORY: Past Surgical History:  Procedure Laterality Date  . CHOLECYSTECTOMY    . ELECTROMAGNETIC NAVIGATION BROCHOSCOPY N/A 08/19/2017   Procedure: ELECTROMAGNETIC NAVIGATION BRONCHOSCOPY;  Surgeon: Flora Lipps, MD;  Location: [facility] ORS;  Service: Cardiopulmonary;  Laterality: N/A;  . ELECTROMAGNETIC NAVIGATION BROCHOSCOPY Left 09/02/2017   Procedure: ELECTROMAGNETIC NAVIGATION BRONCHOSCOPY;  Surgeon: Flora Lipps, MD;   Location: [facility] ORS;  Service: Cardiopulmonary;  Laterality: Left;  . THROAT SURGERY      SOCIAL HISTORY: Social History   Socioeconomic History  . Marital status: Married    Spouse name: Not on file  . Number of children: Not on file  . Years of education: Not on file  . Highest education level: Not on file  Occupational History  . Not on file  Social Needs  . Financial resource strain: Not hard at all  . Food insecurity:    Worry: Never true    Inability: Never true  . Transportation needs:    Medical: No    Non-medical: No  Tobacco Use  . Smoking status: Light Tobacco Smoker    Packs/day: 0.60    Years: 53.00    Pack years: 31.80    Types: Cigarettes  . Smokeless tobacco: Never Used  . Tobacco comment: Approx 6 cigs a week   Substance and Sexual Activity  . Alcohol use: No    Alcohol/week: 0.0 standard drinks  . Drug use: No  . Sexual activity: Not Currently  Lifestyle  . Physical activity:    Days per week: 0 days    Minutes per session: 0 min  . Stress: Not at all  Relationships  . Social connections:    Talks on phone: Twice a week    Gets together: Once a week    Attends religious service: More than 4 times per year    Active member of club or organization: No    Attends meetings of clubs or organizations: Never    Relationship status: Married  . Intimate partner violence:    Fear of current or ex partner: No    Emotionally abused: No    Physically abused: No    Forced  sexual activity: No  Other Topics Concern  . Not on file  Social History Narrative   Works part time at [employer]    FAMILY HISTORY: Family History  Problem Relation Age of Onset  . Heart disease Father   . Heart attack Father   . Brain cancer Sister   . Cervical cancer Sister   . Rectal cancer Brother   . Lung cancer Brother   . Lung cancer Brother   . Lung cancer Brother   . Stomach cancer Sister   . Lung cancer Brother   . Stomach cancer Sister   . Diabetes Sister   . Lupus  Sister     ALLERGIES:  has No Known Allergies.  MEDICATIONS:  Current Outpatient Medications  Medication Sig Dispense Refill  . allopurinol (ZYLOPRIM) 100 MG tablet TAKE 1 TABLET BY MOUTH ONCE DAILY 90 tablet 0  . aspirin 81 MG tablet Take 81 mg by mouth daily.    Marland Kitchen atorvastatin (LIPITOR) 40 MG tablet Take 1 tablet (40 mg total) by mouth daily. (Patient taking differently: Take 40 mg by mouth daily at 6 PM. ) 90 tablet 3  . metFORMIN (GLUCOPHAGE) 500 MG tablet TAKE 1 TABLET BY MOUTH TWICE DAILY WITH A MEAL 180 tablet 1  . metoprolol succinate (TOPROL XL) 25 MG 24 hr tablet Take 1 tablet (25 mg total) by mouth daily. 90 tablet 3  . ramipril (ALTACE) 10 MG capsule TAKE 1 CAPSULE BY MOUTH ONCE DAILY 90 capsule 3  . SPIRIVA HANDIHALER 18 MCG inhalation capsule PLACE 1 CAPSULE INTO INHALER AND INHALE DAILY (Patient taking differently: PLACE 1 CAPSULE INTO INHALER AND INHALE DAILY in the afternoon) 90 capsule 1   No current facility-administered medications for this visit.      PHYSICAL EXAMINATION: ECOG PERFORMANCE STATUS: 1 - Symptomatic but completely ambulatory Vitals:   11/27/17 1058  BP: 130/64  Pulse: (!) 57  Temp: (!) 95.7 F (35.4 C)  SpO2: 94%   Filed Weights   11/27/17 1058  Weight: 181 lb 7 oz (82.3 kg)    Physical Exam  Constitutional: He is oriented to person, place, and time. He appears well-developed and well-nourished. No distress.  HENT:  Head: Normocephalic and atraumatic.  Right Ear: External ear normal.  Left Ear: External ear normal.  Mouth/Throat: Oropharynx is clear and moist. No oropharyngeal exudate.  Eyes: Pupils are equal, round, and reactive to light. Conjunctivae and EOM are normal. No scleral icterus.  Neck: Normal range of motion. Neck supple.  Cardiovascular: Normal rate, regular rhythm and normal heart sounds.  No murmur heard. Pulmonary/Chest: Effort normal. No respiratory distress. He has no wheezes. He has no rales. He exhibits no  tenderness.  .  Decreased breath sound bilateral  Abdominal: Soft. Bowel sounds are normal. He exhibits no distension and no mass. There is no tenderness. There is no guarding.  Musculoskeletal: Normal range of motion. He exhibits no edema or deformity.  Lymphadenopathy:    He has no cervical adenopathy.  Neurological: He is alert and oriented to person, place, and time. No cranial nerve deficit. Coordination normal.  Skin: Skin is warm and dry. No rash noted. No erythema.  Psychiatric: He has a normal mood and affect. His behavior is normal. Thought content normal.     LABORATORY DATA:  I have reviewed the data as listed Lab Results  Component Value Date   WBC 6.1 07/17/2017   HGB 14.6 08/19/2017   HCT 43.0 08/19/2017   MCV 92 07/17/2017  PLT 166 07/17/2017   Recent Labs    01/12/17 1020 07/17/17 1131  08/14/17 0956  10/28/17 0825 10/29/17 0836 11/03/17 1209  NA 140 142  --  141   < > 141 141 141  K 5.3* 5.3*   < > 5.1   < > 5.8* 5.0 4.6  CL 101 103  --  103   < > 105 106 107  CO2 25 27  --  25   < > 30 29 27   GLUCOSE 96 96  --  98   < > 109* 107* 123*  BUN 15 14  --  18   < > 19 21 21   CREATININE 1.18 1.04  --  1.03   < > 1.31* 1.20 1.11  CALCIUM 9.1 9.2  --  8.9   < > 9.3 9.2 8.9  GFRNONAA 62 72  --  73   < > 53* 59* >60  GFRAA 71 83  --  84   < > >60 >60 >60  PROT 5.8* 5.8*  --  5.5*  --   --   --   --   ALBUMIN 4.0 3.8  --  3.6  --   --   --   --   AST 13 9  --  12  --   --   --   --   ALT 9 8  --  9  --   --   --   --   ALKPHOS 92 90  --  86  --   --   --   --   BILITOT 0.4 0.5  --  0.4  --   --   --   --    < > = values in this interval not displayed.   RADIOGRAPHIC STUDIES: I have personally reviewed the radiological images as listed and agreed with the findings in the report.  11/25/2017 CT chest without 10 x 11 mm left lower lobe pulmonary nodule decreased.  10 x 14 mm right lower lobe with decreasing solid component.  As above.  Aortic atherosclerosis  and emphysema.   ASSESSMENT & PLAN:  1. Lung nodules   2. Squamous cell carcinoma of larynx (HCC)   3. Tobacco abuse    Skin was showed response of left lower lobe nodule. Right upper lobe nodule solid component has decreased in size as well, supporting ?inflammatory process.  Discussed with radiologist Dr.Krishnan Bertis Ruddy.  Recommend repeat CT chest without contrast in 3 months to monitor both lung lesion.  Smoke cessation All questions were answered. The patient knows to call the clinic with any problems questions or concerns.  Return of visit: 3 months Total face to face encounter time for this patient visit was 25 min. >50% of the time was  spent in counseling and coordination of care.     Earlie Server, MD, PhD Hematology Oncology [facility] Pager- [phone] 11/27/2017

## 2017-11-30 ENCOUNTER — Encounter: Payer: Self-pay | Admitting: Radiation Oncology

## 2017-11-30 ENCOUNTER — Other Ambulatory Visit: Payer: Self-pay

## 2017-11-30 ENCOUNTER — Ambulatory Visit
Admission: RE | Admit: 2017-11-30 | Discharge: 2017-11-30 | Disposition: A | Discharge status: Home / Self Care | Payer: Medicare Other | Source: Ambulatory Visit | Attending: Radiation Oncology | Admitting: Radiation Oncology

## 2017-11-30 DIAGNOSIS — C3492 Malignant neoplasm of unspecified part of left bronchus or lung: Secondary | ICD-10-CM | POA: Diagnosis not present

## 2017-11-30 DIAGNOSIS — Z08 Encounter for follow-up examination after completed treatment for malignant neoplasm: Secondary | ICD-10-CM | POA: Diagnosis not present

## 2017-11-30 DIAGNOSIS — R911 Solitary pulmonary nodule: Secondary | ICD-10-CM

## 2017-11-30 NOTE — Progress Notes (Signed)
Radiation Oncology Follow up Note  Name: Philip Wood   Date:   11/30/2017 MRN:  [id number] DOB: Apr 18, 1946    This 72 y.o. male presents to the clinic today for one-month follow-up status post SB RTto his left lung for stage I non-small cell lung cancer.  REFERRING PROVIDER: Kathrine Haddock, NP  HPI: patient is a 72 year oldmale now seen out 1 month having completed SB RT.he does have a right upper lobe lesion under 1 cm which we are following. We did treat his left lower lobe noduleand he is seen today in routine follow-up he is doing well. Specifically denies hemoptysis chest tightness or any change in his pulmonary pattern. Does have a slight nonproductive cough.  COMPLICATIONS OF TREATMENT: none  FOLLOW UP COMPLIANCE: keeps appointments   PHYSICAL EXAM:  BP (!) (P) 148/72 (BP Location: Left Arm, Patient Position: Sitting)   Pulse (P) 70   Temp (!) (P) 95.5 F (35.3 C) (Tympanic)   Wt (P) 182 lb 8.7 oz (82.8 kg)   BMI (P) 26.96 kg/m  Well-developed well-nourished patient in NAD. HEENT reveals PERLA, EOMI, discs not visualized.  Oral cavity is clear. No oral mucosal lesions are identified. Neck is clear without evidence of cervical or supraclavicular adenopathy. Lungs are clear to A&P. Cardiac examination is essentially unremarkable with regular rate and rhythm without murmur rub or thrill. Abdomen is benign with no organomegaly or masses noted. Motor sensory and DTR levels are equal and symmetric in the upper and lower extremities. Cranial nerves II through XII are grossly intact. Proprioception is intact. No peripheral adenopathy or edema is identified. No motor or sensory levels are noted. Crude visual fields are within normal range.  RADIOLOGY RESULTS: CT scan in 3 months has been ordered  PLAN: present time patient is doing well no significant side effects or complaints from his most recent SB RT. We are keeping an eye on his right upper lobe lesion. He has a follow-up CT scan  in 3 months I will see him shortly thereafter. Should there be progression may offer SB RT to his right upper lobe lesion also. Patient knows to call with any concerns.  I would like to take this opportunity to thank you for allowing me to participate in the care of your patient.[name], MD

## 2017-12-29 ENCOUNTER — Ambulatory Visit: Payer: Medicare Other | Admitting: Cardiovascular Disease

## 2018-01-01 ENCOUNTER — Ambulatory Visit (INDEPENDENT_AMBULATORY_CARE_PROVIDER_SITE_OTHER): Payer: Medicare Other | Admitting: Cardiovascular Disease

## 2018-01-01 ENCOUNTER — Encounter: Payer: Self-pay | Admitting: Cardiovascular Disease

## 2018-01-01 VITALS — BP 130/70 | HR 64 | Ht 69.0 in | Wt 179.2 lb

## 2018-01-01 DIAGNOSIS — Z720 Tobacco use: Secondary | ICD-10-CM

## 2018-01-01 DIAGNOSIS — I5022 Chronic systolic (congestive) heart failure: Secondary | ICD-10-CM

## 2018-01-01 DIAGNOSIS — E785 Hyperlipidemia, unspecified: Secondary | ICD-10-CM

## 2018-01-01 DIAGNOSIS — I739 Peripheral vascular disease, unspecified: Secondary | ICD-10-CM | POA: Diagnosis not present

## 2018-01-01 DIAGNOSIS — I10 Essential (primary) hypertension: Secondary | ICD-10-CM | POA: Diagnosis not present

## 2018-01-01 NOTE — Progress Notes (Signed)
Cardiology Office Note   Date:  01/01/2018   ID:  [name], DOB Sep 05, 1945, MRN [id number]  PCP:  Kathrine Haddock, NP  Cardiologist:   Kathlyn Sacramento, MD   Chief Complaint  Patient presents with  . other    3 month follow up. Meds reviewed by the pt. verbally. "doing well."       History of Present Illness: Philip Wood is a 72 y.o. male who is here today for a follow-up visit regarding chronic systolic heart failure.  He was seen in May for preoperative cardiovascular evaluation before bronchoscopy.  The patient has prolonged history of tobacco use and was recently found to have pulmonary nodules. He has multiple chronic medical conditions that include type 2 diabetes, hypertension, hyperlipidemia, tobacco use, COPD and peripheral arterial disease with bilateral leg claudication followed by [facility]. He reported atypical chest pain during his initial evaluation.  Lexiscan Myoview in May showed small severe defect in the apex most likely artifact with no significant ischemia.  However, his EF was moderately reduced at 30 to 45%.  He underwent an echocardiogram which showed an EF of 45 to 50% with grade 2 diastolic dysfunction, mild to moderate TR and mild to moderate pulmonary hypertension with peak systolic pressure of 44 mmHg. He has history of severe alcohol abuse but he quit 15 to 20 years ago. He underwent bronchoscopy with biopsy which was suspicious for cancer and he was treated with radiation therapy.  He has been doing well with no recent chest pain or shortness of breath.  No orthopnea, PND or leg edema.  He cut down on tobacco use and currently he smokes 3 to 4 cigarettes a day.  He used to smoke 1 pack/day.  Coming to quit smoking bedside   Past Medical History:  Diagnosis Date  . Anginal pain ([place])   . Cancer (HCC)    laryngeal  . Chronic kidney disease   . COPD (chronic obstructive pulmonary disease) ([place])   . Diabetes mellitus without  complication ([place])   . GERD (gastroesophageal reflux disease)   . Hyperlipidemia   . Hypertension   . Lumbago   . Neuropathy   . Tobacco abuse disorder     Past Surgical History:  Procedure Laterality Date  . CHOLECYSTECTOMY    . ELECTROMAGNETIC NAVIGATION BROCHOSCOPY N/A 08/19/2017   Procedure: ELECTROMAGNETIC NAVIGATION BRONCHOSCOPY;  Surgeon: Flora Lipps, MD;  Location: [facility] ORS;  Service: Cardiopulmonary;  Laterality: N/A;  . ELECTROMAGNETIC NAVIGATION BROCHOSCOPY Left 09/02/2017   Procedure: ELECTROMAGNETIC NAVIGATION BRONCHOSCOPY;  Surgeon: Flora Lipps, MD;  Location: [facility] ORS;  Service: Cardiopulmonary;  Laterality: Left;  . THROAT SURGERY       Current Outpatient Medications  Medication Sig Dispense Refill  . allopurinol (ZYLOPRIM) 100 MG tablet TAKE 1 TABLET BY MOUTH ONCE DAILY 90 tablet 0  . aspirin 81 MG tablet Take 81 mg by mouth daily.    Marland Kitchen atorvastatin (LIPITOR) 40 MG tablet Take 1 tablet (40 mg total) by mouth daily. (Patient taking differently: Take 40 mg by mouth daily at 6 PM. ) 90 tablet 3  . metFORMIN (GLUCOPHAGE) 500 MG tablet TAKE 1 TABLET BY MOUTH TWICE DAILY WITH A MEAL 180 tablet 1  . metoprolol succinate (TOPROL XL) 25 MG 24 hr tablet Take 1 tablet (25 mg total) by mouth daily. 90 tablet 3  . ramipril (ALTACE) 10 MG capsule TAKE 1 CAPSULE BY MOUTH ONCE DAILY 90 capsule 3  . SPIRIVA HANDIHALER 18  MCG inhalation capsule PLACE 1 CAPSULE INTO INHALER AND INHALE DAILY (Patient taking differently: PLACE 1 CAPSULE INTO INHALER AND INHALE DAILY in the afternoon) 90 capsule 1   No current facility-administered medications for this visit.     Allergies:   Patient has no known allergies.    Social History:  The patient  reports that he has been smoking cigarettes. He has a 31.80 pack-year smoking history. He has never used smokeless tobacco. He reports that he does not drink alcohol or use drugs.   Family History:  The patient's family history includes Brain cancer  in his sister; Cervical cancer in his sister; Diabetes in his sister; Heart attack in his father; Heart disease in his father; Lung cancer in his brother, brother, brother, and brother; Lupus in his sister; Rectal cancer in his brother; Stomach cancer in his sister and sister.    ROS:  Please see the history of present illness.   Otherwise, review of systems are positive for none.   All other systems are reviewed and negative.    PHYSICAL EXAM: VS:  BP 130/70 (BP Location: Left Arm, Patient Position: Sitting, Cuff Size: Normal)   Pulse 64   Ht 5\' 9"  (1.753 m)   Wt 179 lb 4 oz (81.3 kg)   BMI 26.47 kg/m  , BMI Body mass index is 26.47 kg/m. GEN: Well nourished, well developed, in no acute distress  HEENT: normal  Neck: no JVD,  or masses.  Left carotid bruit Cardiac: RRR; no murmurs, rubs, or gallops,no edema  Respiratory:  clear to auscultation bilaterally, normal work of breathing GI: soft, nontender, nondistended, + BS MS: no deformity or atrophy  Skin: warm and dry, no rash Neuro:  Strength and sensation are intact Psych: euthymic mood, full affect   EKG:  EKG is ordered today. The ekg ordered today demonstrates sinus rhythm with occasional PVCs and nonspecific ST changes.   Recent Labs: 07/17/2017: Platelets 166; TSH 2.680 08/14/2017: ALT 9 08/19/2017: Hemoglobin 14.6 11/03/2017: BUN 21; Creatinine, Ser 1.11; Potassium 4.6; Sodium 141    Lipid Panel    Component Value Date/Time   CHOL 152 08/14/2017 0956   CHOL 175 01/11/2016 0914   TRIG 90 08/14/2017 0956   TRIG 364 (H) 01/11/2016 0914   HDL 43 08/14/2017 0956   VLDL 73 (H) 01/11/2016 0914   LDLCALC 91 08/14/2017 0956      Wt Readings from Last 3 Encounters:  01/01/18 179 lb 4 oz (81.3 kg)  11/30/17 (P) 182 lb 8.7 oz (82.8 kg)  11/27/17 181 lb 7 oz (82.3 kg)      PAD Screen 08/21/2017  Previous PAD dx? No  Previous surgical procedure? Yes  Pain with walking? No  Feet/toe relief with dangling? No  Painful,  non-healing ulcers? No  Extremities discolored? No      ASSESSMENT AND PLAN:  1.  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  Continue treatment with Toprol and ramipril.  2.  Left carotid bruit: Carotid Doppler showed mild nonobstructive disease.  3.  Peripheral arterial disease with bilateral leg claudication: Followed by [facility].  4.  Hyperlipidemia: Continue treatment with atorvastatin with a target LDL of less than 70.  Most recent LDL was 91.  We can consider switching him to rosuvastatin or adding Zetia.  5.  Essential hypertension: Blood pressure is controlled on current medications.  6.  Tobacco use: I again discussed with him the importance  of smoking cessation and provided him with written instructions to have him quit.  Disposition:   FU with me in 6 months  Signed,  Kathlyn Sacramento, MD  01/01/2018 8:30 AM    [place]

## 2018-01-01 NOTE — Patient Instructions (Addendum)
Follow-Up: Your physician wants you to follow-up in: 6 months with Dr. Fletcher Anon. You will receive a reminder letter in the mail two months in advance. If you don't receive a letter, please call our office to schedule the follow-up appointment.  It was a pleasure seeing you today here in the office. Please do not hesitate to give [place] a call back if you have any further questions. [place], BSN     Steps to Quit Smoking Smoking tobacco can be bad for your health. It can also affect almost every organ in your body. Smoking puts you and people around you at risk for many serious long-lasting (chronic) diseases. Quitting smoking is hard, but it is one of the best things that you can do for your health. It is never too late to quit. What are the benefits of quitting smoking? When you quit smoking, you lower your risk for getting serious diseases and conditions. They can include:  Lung cancer or lung disease.  Heart disease.  Stroke.  Heart attack.  Not being able to have children (infertility).  Weak bones (osteoporosis) and broken bones (fractures).  If you have coughing, wheezing, and shortness of breath, those symptoms may get better when you quit. You may also get sick less often. If you are pregnant, quitting smoking can help to lower your chances of having a baby of low birth weight. What can I do to help me quit smoking? Talk with your doctor about what can help you quit smoking. Some things you can do (strategies) include:  Quitting smoking totally, instead of slowly cutting back how much you smoke over a period of time.  Going to in-person counseling. You are more likely to quit if you go to many counseling sessions.  Using resources and support systems, such as: ? Database administrator with a Social worker. ? Phone quitlines. ? Careers information officer. ? Support groups or group counseling. ? Text messaging programs. ? Mobile phone apps or applications.  Taking  medicines. Some of these medicines may have nicotine in them. If you are pregnant or breastfeeding, do not take any medicines to quit smoking unless your doctor says it is okay. Talk with your doctor about counseling or other things that can help you.  Talk with your doctor about using more than one strategy at the same time, such as taking medicines while you are also going to in-person counseling. This can help make quitting easier. What things can I do to make it easier to quit? Quitting smoking might feel very hard at first, but there is a lot that you can do to make it easier. Take these steps:  Talk to your family and friends. Ask them to support and encourage you.  Call phone quitlines, reach out to support groups, or work with a Social worker.  Ask people who smoke to not smoke around you.  Avoid places that make you want (trigger) to smoke, such as: ? Bars. ? Parties. ? Smoke-break areas at work.  Spend time with people who do not smoke.  Lower the stress in your life. Stress can make you want to smoke. Try these things to help your stress: ? Getting regular exercise. ? Deep-breathing exercises. ? Yoga. ? Meditating. ? Doing a body scan. To do this, close your eyes, focus on one area of your body at a time from head to toe, and notice which parts of your body are tense. Try to relax the muscles in those areas.  Download  or buy apps on your mobile phone or tablet that can help you stick to your quit plan. There are many free apps, such as QuitGuide from the [employer] Office manager for Disease Control and Prevention). You can find more support from smokefree.gov and other websites.  This information is not intended to replace advice given to you by your health care provider. Make sure you discuss any questions you have with your health care provider. Document Released: 02/01/2009 Document Revised: 12/04/2015 Document Reviewed: 08/22/2014 Elsevier Interactive Patient Education  2018 [place] Risks of Smoking Smoking cigarettes is very bad for your health. Tobacco smoke has over 200 known poisons in it. It contains the poisonous gases nitrogen oxide and carbon monoxide. There are over 60 chemicals in tobacco smoke that cause cancer. Smoking is difficult to quit because a chemical in tobacco, called nicotine, causes addiction or dependence. When you smoke and inhale, nicotine is absorbed rapidly into the bloodstream through your lungs. Both inhaled and non-inhaled nicotine may be addictive. What are the risks of cigarette smoke? Cigarette smokers have an increased risk of many serious medical problems, including:  Lung cancer.  Lung disease, such as pneumonia, bronchitis, and emphysema.  Chest pain (angina) and heart attack because the heart is not getting enough oxygen.  Heart disease and peripheral blood vessel disease.  High blood pressure (hypertension).  Stroke.  Oral cancer, including cancer of the lip, mouth, or voice box.  Bladder cancer.  Pancreatic cancer.  Cervical cancer.  Pregnancy complications, including premature birth.  Stillbirths and smaller newborn babies, birth defects, and genetic damage to sperm.  Early menopause.  Lower estrogen level for women.  Infertility.  Facial wrinkles.  Blindness.  Increased risk of broken bones (fractures).  Senile dementia.  Stomach ulcers and internal bleeding.  Delayed wound healing and increased risk of complications during surgery.  Even smoking lightly shortens your life expectancy by several years.  Because of secondhand smoke exposure, children of smokers have an increased risk of the following:  Sudden infant death syndrome (SIDS).  Respiratory infections.  Lung cancer.  Heart disease.  Ear infections.  What are the benefits of quitting? There are many health benefits of quitting smoking. Here are some of them:  Within days of quitting smoking, your risk of having a  heart attack decreases, your blood flow improves, and your lung capacity improves. Blood pressure, pulse rate, and breathing patterns start returning to normal soon after quitting.  Within months, your lungs may clear up completely.  Quitting for 10 years reduces your risk of developing lung cancer and heart disease to almost that of a nonsmoker.  People who quit may see an improvement in their overall quality of life.  How do I quit smoking? Smoking is an addiction with both physical and psychological effects, and longtime habits can be hard to change. Your health care provider can recommend:  Programs and community resources, which may include group support, education, or talk therapy.  Prescription medicines to help reduce cravings.  Nicotine replacement products, such as patches, gum, and nasal sprays. Use these products only as directed. Do not replace cigarette smoking with electronic cigarettes, which are commonly called e-cigarettes. The safety of e-cigarettes is not known, and some may contain harmful chemicals.  A combination of two or more of these methods.  Where to find more information:  American Lung Association: www.lung.org  American Cancer Society: www.cancer.org Summary  Smoking cigarettes is very bad for your health. Cigarette smokers have an  increased risk of many serious medical problems, including several cancers, heart disease, and stroke.  Smoking is an addiction with both physical and psychological effects, and longtime habits can be hard to change.  By stopping right away, you can greatly reduce the risk of medical problems for you and your family.  To help you quit smoking, your health care provider can recommend programs, community resources, prescription medicines, and nicotine replacement products such as patches, gum, and nasal sprays. This information is not intended to replace advice given to you by your health care provider. Make sure you discuss  any questions you have with your health care provider. Document Released: 05/15/2004 Document Revised: 04/11/2016 Document Reviewed: 04/11/2016 Elsevier Interactive Patient Education  2017 [place] with Quitting Smoking Quitting smoking is a physical and mental challenge. You will face cravings, withdrawal symptoms, and temptation. Before quitting, work with your health care provider to make a plan that can help you cope. Preparation can help you quit and keep you from giving in. How can I cope with cravings? Cravings usually last for 5-10 minutes. If you get through it, the craving will pass. Consider taking the following actions to help you cope with cravings:  Keep your mouth busy: ? Chew sugar-free gum. ? Suck on hard candies or a straw. ? Brush your teeth.  Keep your hands and body busy: ? Immediately change to a different activity when you feel a craving. ? Squeeze or play with a ball. ? Do an activity or a hobby, like making bead jewelry, practicing needlepoint, or working with wood. ? Mix up your normal routine. ? Take a short exercise break. Go for a quick walk or run up and down stairs. ? Spend time in public places where smoking is not allowed.  Focus on doing something kind or helpful for someone else.  Call a friend or family member to talk during a craving.  Join a support group.  Call a quit line, such as 1-800-QUIT-NOW.  Talk with your health care provider about medicines that might help you cope with cravings and make quitting easier for you.  How can I deal with withdrawal symptoms? Your body may experience negative effects as it tries to get used to not having nicotine in the system. These effects are called withdrawal symptoms. They may include:  Feeling hungrier than normal.  Trouble concentrating.  Irritability.  Trouble sleeping.  Feeling depressed.  Restlessness and agitation.  Craving a cigarette.  To manage withdrawal  symptoms:  Avoid places, people, and activities that trigger your cravings.  Remember why you want to quit.  Get plenty of sleep.  Avoid coffee and other caffeinated drinks. These may worsen some of your symptoms.  How can I handle social situations? Social situations can be difficult when you are quitting smoking, especially in the first few weeks. To manage this, you can:  Avoid parties, bars, and other social situations where people might be smoking.  Avoid alcohol.  Leave right away if you have the urge to smoke.  Explain to your family and friends that you are quitting smoking. Ask for understanding and support.  Plan activities with friends or family where smoking is not an option.  What are some ways I can cope with stress? Wanting to smoke may cause stress, and stress can make you want to smoke. Find ways to manage your stress. Relaxation techniques can help. For example:  Breathe slowly and deeply, in through your nose and out through your  mouth.  Listen to soothing, relaxing music.  Talk with a family member or friend about your stress.  Light a candle.  Soak in a bath or take a shower.  Think about a peaceful place.  What are some ways I can prevent weight gain? Be aware that many people gain weight after they quit smoking. However, not everyone does. To keep from gaining weight, have a plan in place before you quit and stick to the plan after you quit. Your plan should include:  Having healthy snacks. When you have a craving, it may help to: ? Eat plain popcorn, crunchy carrots, celery, or other cut vegetables. ? Chew sugar-free gum.  Changing how you eat: ? Eat small portion sizes at meals. ? Eat 4-6 small meals throughout the day instead of 1-2 large meals a day. ? Be mindful when you eat. Do not watch television or do other things that might distract you as you eat.  Exercising regularly: ? Make time to exercise each day. If you do not have time for a  long workout, do short bouts of exercise for 5-10 minutes several times a day. ? Do some form of strengthening exercise, like weight lifting, and some form of aerobic exercise, like running or swimming.  Drinking plenty of water or other low-calorie or no-calorie drinks. Drink 6-8 glasses of water daily, or as much as instructed by your health care provider.  Summary  Quitting smoking is a physical and mental challenge. You will face cravings, withdrawal symptoms, and temptation to smoke again. Preparation can help you as you go through these challenges.  You can cope with cravings by keeping your mouth busy (such as by chewing gum), keeping your body and hands busy, and making calls to family, friends, or a helpline for people who want to quit smoking.  You can cope with withdrawal symptoms by avoiding places where people smoke, avoiding drinks with caffeine, and getting plenty of rest.  Ask your health care provider about the different ways to prevent weight gain, avoid stress, and handle social situations. This information is not intended to replace advice given to you by your health care provider. Make sure you discuss any questions you have with your health care provider. Document Released: 04/04/2016 Document Revised: 04/04/2016 Document Reviewed: 04/04/2016 Elsevier Interactive Patient Education  [employer].

## 2018-01-07 ENCOUNTER — Other Ambulatory Visit: Payer: Self-pay | Admitting: Family Medicine

## 2018-02-04 ENCOUNTER — Other Ambulatory Visit: Payer: Self-pay | Admitting: Unknown Physician Specialty

## 2018-02-04 NOTE — Telephone Encounter (Signed)
Requested Prescriptions  Pending Prescriptions Disp Refills  . SPIRIVA HANDIHALER 18 MCG inhalation capsule [Pharmacy Med Name: SPIRIVA HANDIHLR 18MCG CAP] 90 capsule 1    Sig: PLACE 1 CAPSULE INTO INHALER AND INHALE DAILY     Pulmonology:  Anticholinergic Agents Passed - 02/04/2018 10:39 AM      Passed - Valid encounter within last 12 months    Recent Outpatient Visits          4 months ago Cellulitis of left upper extremity   [facility] Kathrine Haddock, NP   5 months ago Hypertensive chronic kidney disease, unspecified CKD stage   [facility] Kathrine Haddock, NP   6 months ago Weight loss   [facility] Kathrine Haddock, NP   1 year ago Need for influenza vaccination   [facility] Kathrine Haddock, NP   1 year ago Diabetes mellitus due to underlying condition with chronic kidney disease, without long-term current use of insulin, unspecified CKD stage ([place])   [facility] Kathrine Haddock, NP      Future Appointments            In 1 week Kathrine Haddock, NP [place], [place]   In 64 months  [employer]

## 2018-02-12 ENCOUNTER — Ambulatory Visit (INDEPENDENT_AMBULATORY_CARE_PROVIDER_SITE_OTHER): Payer: Medicare Other | Admitting: Unknown Physician Specialty

## 2018-02-12 ENCOUNTER — Encounter: Payer: Self-pay | Admitting: Unknown Physician Specialty

## 2018-02-12 VITALS — BP 148/86 | HR 66 | Wt 178.2 lb

## 2018-02-12 DIAGNOSIS — Z23 Encounter for immunization: Secondary | ICD-10-CM

## 2018-02-12 DIAGNOSIS — N183 Chronic kidney disease, stage 3 unspecified: Secondary | ICD-10-CM

## 2018-02-12 DIAGNOSIS — F17200 Nicotine dependence, unspecified, uncomplicated: Secondary | ICD-10-CM

## 2018-02-12 DIAGNOSIS — E0822 Diabetes mellitus due to underlying condition with diabetic chronic kidney disease: Secondary | ICD-10-CM | POA: Diagnosis not present

## 2018-02-12 DIAGNOSIS — I129 Hypertensive chronic kidney disease with stage 1 through stage 4 chronic kidney disease, or unspecified chronic kidney disease: Secondary | ICD-10-CM | POA: Diagnosis not present

## 2018-02-12 DIAGNOSIS — C3490 Malignant neoplasm of unspecified part of unspecified bronchus or lung: Secondary | ICD-10-CM

## 2018-02-12 DIAGNOSIS — E785 Hyperlipidemia, unspecified: Secondary | ICD-10-CM | POA: Diagnosis not present

## 2018-02-12 LAB — BAYER DCA HB A1C WAIVED: HB A1C: 5.7 % (ref <7.0)

## 2018-02-12 MED ORDER — TIOTROPIUM BROMIDE MONOHYDRATE 18 MCG IN CAPS: 18.0000 ug | ORAL_CAPSULE | Freq: Every day | RESPIRATORY_TRACT | 1 refills | Status: DC

## 2018-02-12 MED ORDER — ALLOPURINOL 100 MG PO TABS: 100.0000 mg | ORAL_TABLET | Freq: Every day | ORAL | 0 refills | Status: DC

## 2018-02-12 MED ORDER — RAMIPRIL 10 MG PO CAPS: 10.0000 mg | ORAL_CAPSULE | Freq: Every day | ORAL | 3 refills | Status: DC

## 2018-02-12 MED ORDER — ATORVASTATIN CALCIUM 40 MG PO TABS: 40.0000 mg | ORAL_TABLET | Freq: Every day | ORAL | 3 refills | Status: DC

## 2018-02-12 MED ORDER — METFORMIN HCL 500 MG PO TABS: 500.0000 mg | ORAL_TABLET | Freq: Two times a day (BID) | ORAL | 1 refills | Status: DC

## 2018-02-12 MED ORDER — METOPROLOL SUCCINATE ER 25 MG PO TB24: 25.0000 mg | ORAL_TABLET | Freq: Every day | ORAL | 3 refills | Status: DC

## 2018-02-12 NOTE — Assessment & Plan Note (Signed)
Encouraged to quit.  Smoking 5 cigarettes/day and unable to wean further

## 2018-02-12 NOTE — Assessment & Plan Note (Signed)
Hgb A1C was 5.7.  Continue present medications

## 2018-02-12 NOTE — Assessment & Plan Note (Signed)
Stable, continue present medications.   

## 2018-02-12 NOTE — Progress Notes (Signed)
BP (!) 148/86 (BP Location: Left Arm, Patient Position: Sitting, Cuff Size: Normal)   Pulse 66   Wt 178 lb 4 oz (80.9 kg)   SpO2 97%   BMI 26.32 kg/m    Subjective:    Patient ID: Philip Wood, male    DOB: 07/10/1945, 72 y.o.   MRN: [id number]  HPI: Philip Wood is a 72 y.o. male  Chief Complaint  Patient presents with  . Hypertension  . Hyperlipidemia  . Diabetes   Diabetes: Using medications without difficulties No hypoglycemic episodes No hyperglycemic episodes Feet problems:none Blood Sugars averaging:not checking eye exam within last year Last Hgb A1C: 5.9  Hypertension  Using medications without difficulty Average home BPs Not checking   Using medication without problems or lightheadedness No chest pain with exertion or shortness of breath No Edema  Elevated Cholesterol Using medications without problems No Muscle aches  Diet: Eats a lot of processed food Exercise: At work  Lung cancer - following with cancer center.  Smokes 5-6 cigs/day.  Nots reviewed.  Getting radiation treatments.  Stable disease with restaging scheduled next month  COPD Feels symptoms are well controlled: Using medications without problems Night time symptoms: no ER visits since last visit:none Missed work or school:: No.  Continues to work part time Increased cough:no Increased SOB:no Using O2:no  Hyperkalemia K ws elevated on Spironalactone.  Normalized now on Ramipril last checked on 7/16 through cardiology.  Notes from cardiology reviewed.    Relevant past medical, surgical, family and social history reviewed and updated as indicated. Interim medical history since our last visit reviewed. Allergies and medications reviewed and updated.  Review of Systems  Per HPI unless specifically indicated above     Objective:    BP (!) 148/86 (BP Location: Left Arm, Patient Position: Sitting, Cuff Size: Normal)   Pulse 66   Wt 178 lb 4 oz (80.9 kg)   SpO2 97%   BMI  26.32 kg/m   Wt Readings from Last 3 Encounters:  02/12/18 178 lb 4 oz (80.9 kg)  01/01/18 179 lb 4 oz (81.3 kg)  11/30/17 (P) 182 lb 8.7 oz (82.8 kg)    Physical Exam  Constitutional: He is oriented to person, place, and time. He appears well-developed and well-nourished. No distress.  HENT:  Head: Normocephalic and atraumatic.  Eyes: Conjunctivae and lids are normal. Right eye exhibits no discharge. Left eye exhibits no discharge. No scleral icterus.  Neck: Normal range of motion. Neck supple. No JVD present. Carotid bruit is not present.  Cardiovascular: Normal rate, regular rhythm and normal heart sounds.  Pulmonary/Chest: Effort normal. No respiratory distress.  Left base diminished with a wheeze  Abdominal: Normal appearance. There is no splenomegaly or hepatomegaly.  Musculoskeletal: Normal range of motion.  Neurological: He is alert and oriented to person, place, and time.  Skin: Skin is warm, dry and intact. No rash noted. No pallor.  Psychiatric: He has a normal mood and affect. His behavior is normal. Judgment and thought content normal.    Results for orders placed or performed during the hospital encounter of 40/98/11  Basic metabolic panel  Result Value Ref Range   Sodium 141 135 - 145 mmol/L   Potassium 4.6 3.5 - 5.1 mmol/L   Chloride 107 98 - 111 mmol/L   CO2 27 22 - 32 mmol/L   Glucose, Bld 123 (H) 70 - 99 mg/dL   BUN 21 8 - 23 mg/dL   Creatinine, Ser 1.11 0.61 -  1.24 mg/dL   Calcium 8.9 8.9 - 10.3 mg/dL   GFR calc non Af Amer >60 >60 mL/min   GFR calc Af Amer >60 >60 mL/min   Anion gap 7 5 - 15      Assessment & Plan:   Problem List Items Addressed This Visit      Unprioritized   CKD (chronic kidney disease), stage III ([place]) - Primary    Check GFR today.  This has improved since not taking NSAIDs      Relevant Orders   Comprehensive metabolic panel   Diabetes mellitus with chronic kidney disease (HCC)    Hgb A1C was 5.7.  Continue present  medications      Relevant Medications   atorvastatin (LIPITOR) 40 MG tablet   metFORMIN (GLUCOPHAGE) 500 MG tablet   ramipril (ALTACE) 10 MG capsule   Other Relevant Orders   Bayer DCA Hb A1c Waived   Hyperlipidemia   Relevant Medications   atorvastatin (LIPITOR) 40 MG tablet   metoprolol succinate (TOPROL XL) 25 MG 24 hr tablet   ramipril (ALTACE) 10 MG capsule   Other Relevant Orders   Lipid Panel w/o Chol/HDL Ratio   Hypertensive CKD (chronic kidney disease)    Stable, continue present medications.        Relevant Orders   Comprehensive metabolic panel   Lung cancer ([place])    Restaging with CT de next month through Oncologist      Relevant Medications   allopurinol (ZYLOPRIM) 100 MG tablet   Tobacco use disorder    Encouraged to quit.  Smoking 5 cigarettes/day and unable to wean further          Follow up plan: Return in about 6 months (around 08/14/2018).

## 2018-02-12 NOTE — Assessment & Plan Note (Signed)
Restaging with CT de next month through Oncologist

## 2018-02-12 NOTE — Assessment & Plan Note (Signed)
Check GFR today.  This has improved since not taking NSAIDs

## 2018-02-12 NOTE — Patient Instructions (Signed)

## 2018-02-13 LAB — COMPREHENSIVE METABOLIC PANEL
ALT: 8 IU/L (ref 0–44)
AST: 9 IU/L (ref 0–40)
Albumin/Globulin Ratio: 1.9 (ref 1.2–2.2)
Albumin: 3.8 g/dL (ref 3.5–4.8)
Alkaline Phosphatase: 90 IU/L (ref 39–117)
BUN/Creatinine Ratio: 13 (ref 10–24)
BUN: 14 mg/dL (ref 8–27)
Bilirubin Total: 0.4 mg/dL (ref 0.0–1.2)
CALCIUM: 9.5 mg/dL (ref 8.6–10.2)
CO2: 25 mmol/L (ref 20–29)
CREATININE: 1.1 mg/dL (ref 0.76–1.27)
Chloride: 102 mmol/L (ref 96–106)
GFR, EST AFRICAN AMERICAN: 77 mL/min/{1.73_m2} (ref >59)
GFR, EST NON AFRICAN AMERICAN: 67 mL/min/{1.73_m2} (ref >59)
GLOBULIN, TOTAL: 2 g/dL (ref 1.5–4.5)
Glucose: 92 mg/dL (ref 65–99)
POTASSIUM: 5.1 mmol/L (ref 3.5–5.2)
SODIUM: 143 mmol/L (ref 134–144)
TOTAL PROTEIN: 5.8 g/dL — AB (ref 6.0–8.5)

## 2018-02-13 LAB — LIPID PANEL W/O CHOL/HDL RATIO
Cholesterol, Total: 148 mg/dL (ref 100–199)
HDL: 38 mg/dL — AB (ref >39)
LDL CALC: 89 mg/dL (ref 0–99)
Triglycerides: 107 mg/dL (ref 0–149)
VLDL Cholesterol Cal: 21 mg/dL (ref 5–40)

## 2018-02-15 ENCOUNTER — Encounter: Payer: Self-pay | Admitting: Unknown Physician Specialty

## 2018-03-01 ENCOUNTER — Ambulatory Visit
Admission: RE | Admit: 2018-03-01 | Discharge: 2018-03-01 | Disposition: A | Discharge status: Home / Self Care | Payer: Medicare Other | Source: Ambulatory Visit | Attending: Oncology | Admitting: Oncology

## 2018-03-01 DIAGNOSIS — I700 Atherosclerosis of aorta: Secondary | ICD-10-CM | POA: Insufficient documentation

## 2018-03-01 DIAGNOSIS — I2510 Atherosclerotic heart disease of native coronary artery without angina pectoris: Secondary | ICD-10-CM | POA: Diagnosis not present

## 2018-03-01 DIAGNOSIS — J432 Centrilobular emphysema: Secondary | ICD-10-CM | POA: Diagnosis not present

## 2018-03-01 DIAGNOSIS — I358 Other nonrheumatic aortic valve disorders: Secondary | ICD-10-CM | POA: Insufficient documentation

## 2018-03-01 DIAGNOSIS — R918 Other nonspecific abnormal finding of lung field: Secondary | ICD-10-CM | POA: Insufficient documentation

## 2018-03-01 IMAGING — CT CT CHEST W/O CM
2 of 4 series · 14 of 36 positions shown, 17 images · non-contrast
Comparison: Chest CT [DATE].

CLINICAL DATA: 72-year-old male with history of left lower lobe and
right upper lobe pulmonary nodules. Follow-up study.

EXAM:
CT CHEST WITHOUT CONTRAST
TECHNIQUE: Multidetector CT imaging of the chest was performed following the
standard protocol without IV contrast.

[Series 2: chest · axial · 0.63mm/px · z∈[-1351,-1051]mm · 11 of 178 slices shown, 14 images (1 of 2)]
[im 14/178  mediastinal]
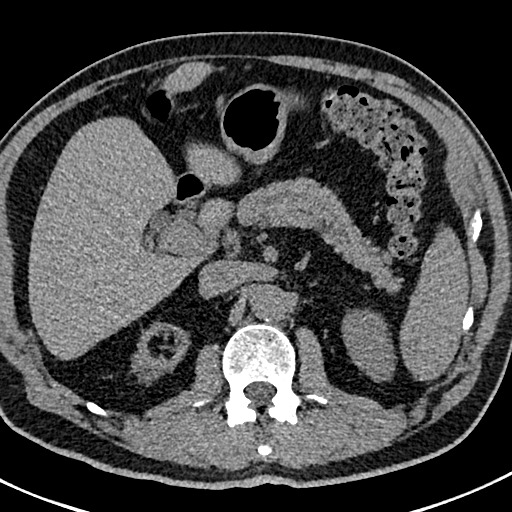
[im 14/178  lung]
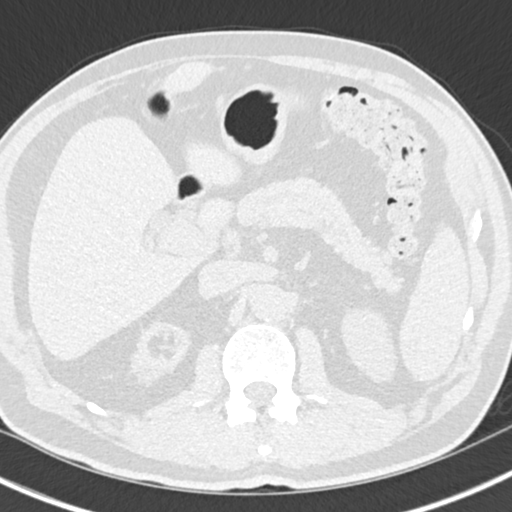
[im 28/178  lung]
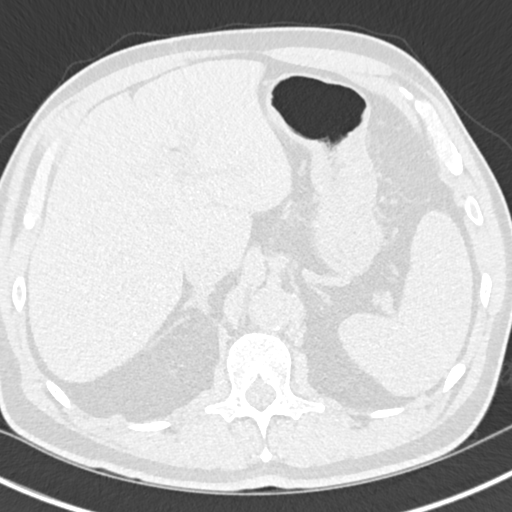
[im 41/178  lung]
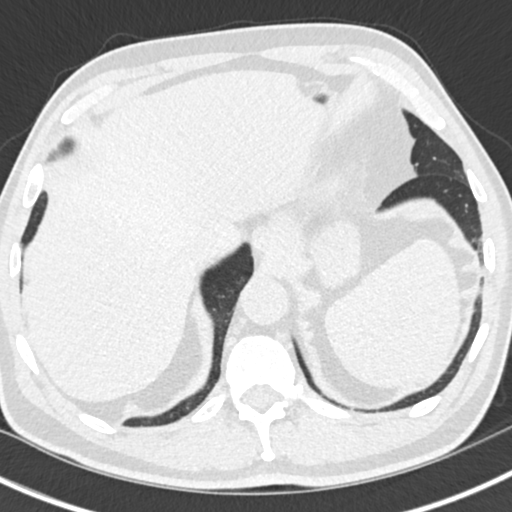
[im 55/178  lung]
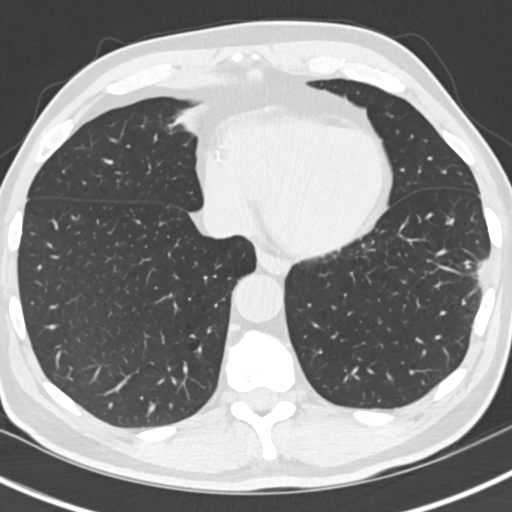
[im 69/178  mediastinal]
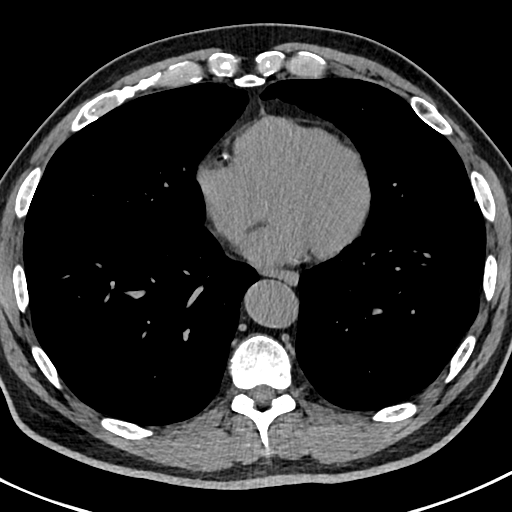
[im 69/178  lung]
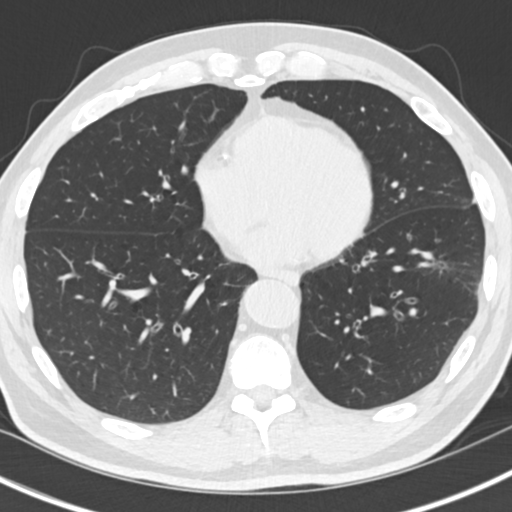
[im 96/178  lung]
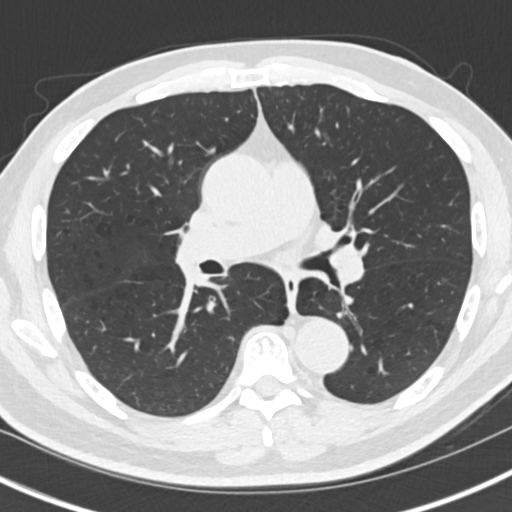
[im 109/178  lung]
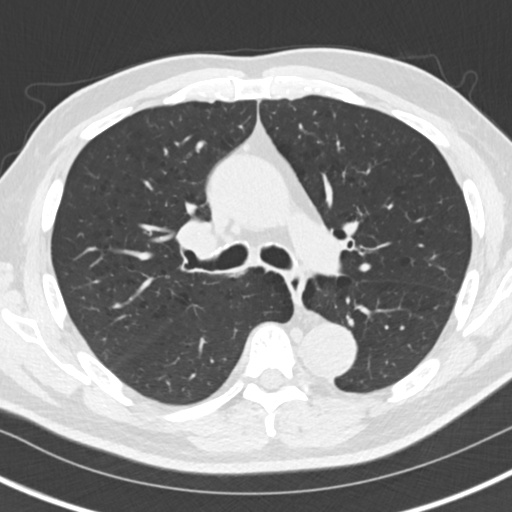
[im 123/178  lung]
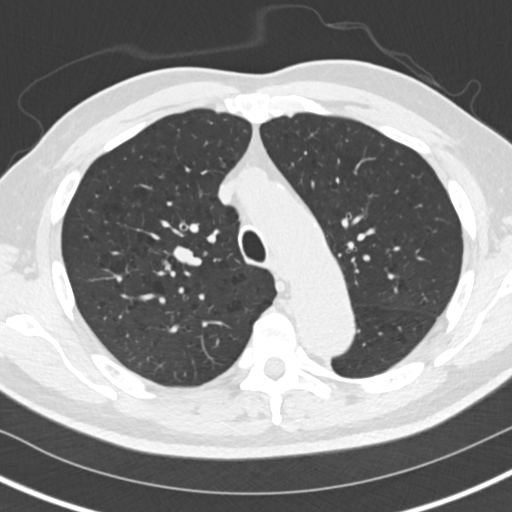
[im 137/178  mediastinal]
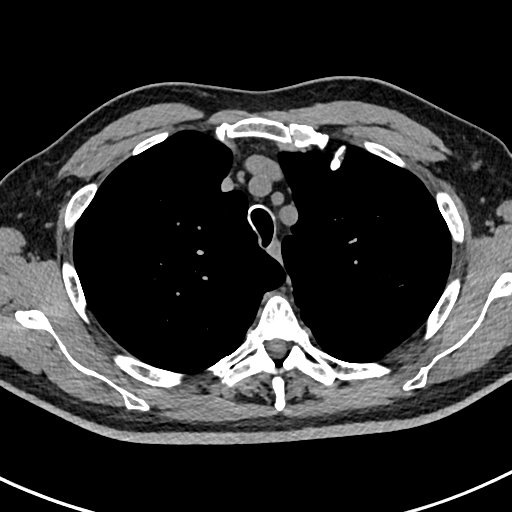
[im 137/178  lung]
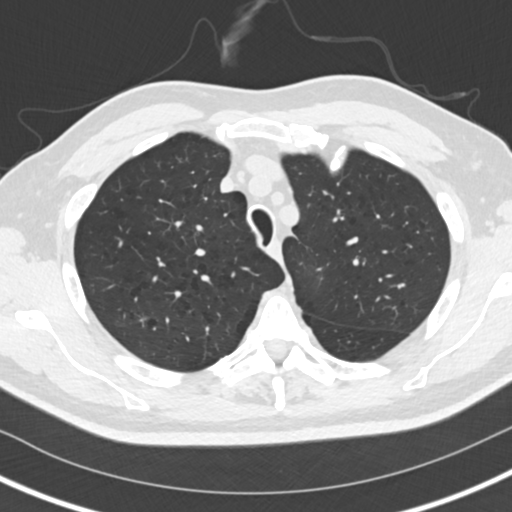
[im 150/178  lung]
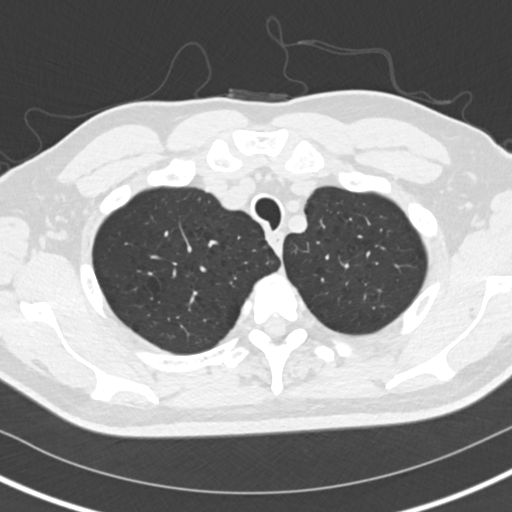
[im 164/178  lung]
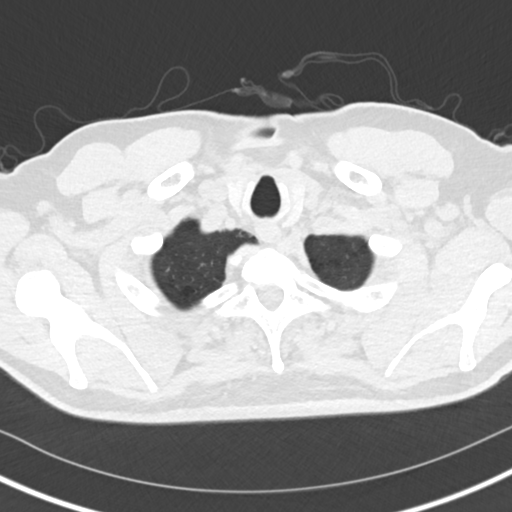

[Series 5: chest · coronal · 0.66mm/px · 3 of 151 slices shown (2 of 2)]
[im 31/151  lung]
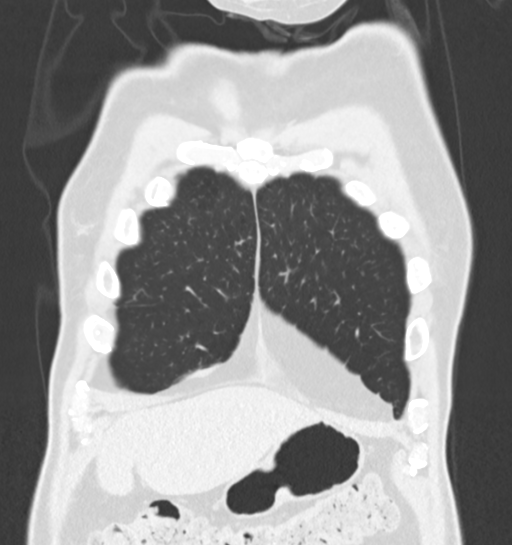
[im 61/151  lung]
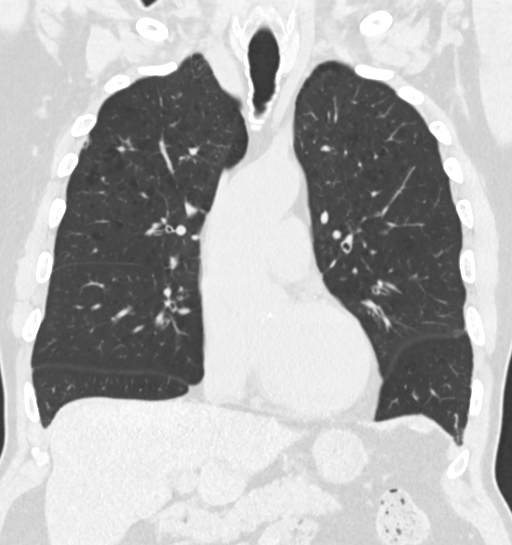
[im 91/151  lung]
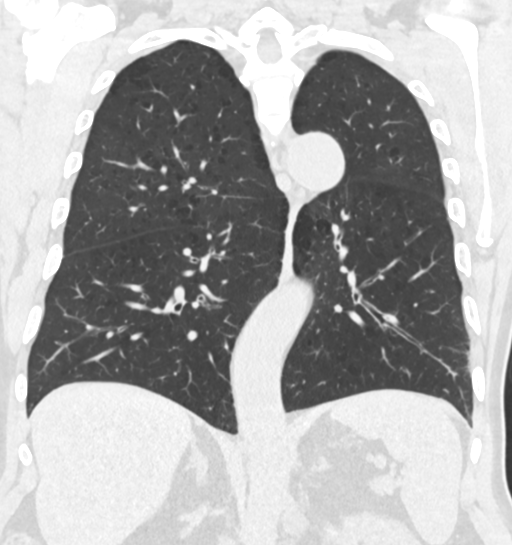

[14 of 36 positions shown; findings below may reference images not displayed]

FINDINGS: Cardiovascular: Heart size is normal. There is no significant
pericardial fluid, thickening or pericardial calcification. There is
aortic atherosclerosis, as well as atherosclerosis of the great
vessels of the mediastinum and the coronary arteries, including
calcified atherosclerotic plaque in the left main, left anterior
descending, left circumflex and right coronary arteries.
Calcifications of the aortic valve.

Mediastinum/Nodes: No pathologically enlarged mediastinal or hilar
lymph nodes. Please note that accurate exclusion of hilar adenopathy
is limited on noncontrast CT scans. Esophagus is unremarkable in
appearance. No axillary lymphadenopathy.

Lungs/Pleura: Mild enlargement of previously noted right upper lobe
nodule which currently measures 12 x 15 mm (axial image 61 of series
3). This nodule is again subsolid in appearance with large internal
cystic areas and several areas of mural thickening and nodularity,
similar to the prior study, with the largest solid area of
nodularity along the posterior aspect of the lesion measuring 6 mm.
Previously noted left lower lobe nodule appears slightly smaller
than the prior study, currently measuring 7 x 11 mm (axial image 111
of series 3). Diffuse bronchial wall with mild to moderate
centrilobular emphysema. No acute consolidative airspace disease. No
pleural effusions.

Upper Abdomen: Status post cholecystectomy. Small calcified
granuloma in the left lobe of the liver incidentally noted. Aortic
atherosclerosis. Severe atrophy of the right kidney. Enlarged left
para-aortic lymph node measuring 1.7 cm in short axis (axial image
171 of series 2), stable in retrospect compared to prior PET-CT
[DATE] at which time this was not hypermetabolic.

Musculoskeletal: There are no aggressive appearing lytic or blastic
lesions noted in the visualized portions of the skeleton.
IMPRESSION: 1. Slight interval enlargement of what is now a 12 x 15 mm mixed
solid and sub solid nodule in the right upper lobe (axial image 61
of series 3). This remains concerning for potential slow-growing
neoplasm such as an adenocarcinoma.
2. Previously noted left lower lobe nodule appears slightly smaller
than the prior study, currently measuring 7 x 11 mm.
3. Mild diffuse bronchial wall thickening with mild to moderate
centrilobular emphysema; imaging findings suggestive of underlying
COPD.
4. Aortic atherosclerosis, in addition to left main and 3 vessel
coronary artery disease. Assessment for potential risk factor
modification, dietary therapy or pharmacologic therapy may be
warranted, if clinically indicated.
5. There are calcifications of the aortic valve. Echocardiographic
correlation for evaluation of potential valvular dysfunction may be
warranted if clinically indicated.

Aortic Atherosclerosis ([2H]-[2H]) and Emphysema ([2H]-[2H]).

## 2018-03-03 ENCOUNTER — Ambulatory Visit
Admission: RE | Admit: 2018-03-03 | Discharge: 2018-03-03 | Disposition: A | Discharge status: Home / Self Care | Payer: Medicare Other | Source: Ambulatory Visit | Attending: Radiation Oncology | Admitting: Radiation Oncology

## 2018-03-03 ENCOUNTER — Encounter: Payer: Self-pay | Admitting: Radiation Oncology

## 2018-03-03 ENCOUNTER — Other Ambulatory Visit: Payer: Self-pay | Admitting: *Deleted

## 2018-03-03 ENCOUNTER — Encounter: Payer: Self-pay | Admitting: Oncology

## 2018-03-03 ENCOUNTER — Inpatient Hospital Stay: Payer: Medicare Other | Attending: Oncology | Admitting: Oncology

## 2018-03-03 ENCOUNTER — Other Ambulatory Visit: Payer: Self-pay

## 2018-03-03 VITALS — BP 138/77 | HR 67 | Temp 95.4°F | Resp 18 | Wt 178.9 lb

## 2018-03-03 VITALS — BP 129/80 | HR 71 | Temp 96.6°F | Resp 18 | Wt 178.9 lb

## 2018-03-03 DIAGNOSIS — R918 Other nonspecific abnormal finding of lung field: Secondary | ICD-10-CM | POA: Diagnosis not present

## 2018-03-03 DIAGNOSIS — C3432 Malignant neoplasm of lower lobe, left bronchus or lung: Secondary | ICD-10-CM | POA: Insufficient documentation

## 2018-03-03 DIAGNOSIS — F17210 Nicotine dependence, cigarettes, uncomplicated: Secondary | ICD-10-CM | POA: Insufficient documentation

## 2018-03-03 DIAGNOSIS — R911 Solitary pulmonary nodule: Secondary | ICD-10-CM

## 2018-03-03 DIAGNOSIS — Z720 Tobacco use: Secondary | ICD-10-CM

## 2018-03-03 DIAGNOSIS — C329 Malignant neoplasm of larynx, unspecified: Secondary | ICD-10-CM | POA: Diagnosis not present

## 2018-03-03 DIAGNOSIS — Z923 Personal history of irradiation: Secondary | ICD-10-CM | POA: Diagnosis not present

## 2018-03-03 NOTE — Progress Notes (Signed)
Radiation Oncology Follow up Note  Name: Philip Wood   Date:   03/03/2018 MRN:  [id number] DOB: 1945-08-31    This 72 y.o. male presents to the clinic today for 4 month follow-up status post SB RT to his left lung for stage I non-small cell lung cancer.  REFERRING PROVIDER: Kathrine Haddock, NP  HPI: patient is a 72 year old male now out 4 months having completed SB RT to hisleft lung for stage I non-small cell lung cancer. He is seen today in routine follow-up and is doing well he specifically denies cough hemoptysis or chest tightness. He's P when take is good his weight is stable..he recently had a CT scan showing now slight enlargement of a 1.5 cm solid and some subtle nodule in the right upper lobe concerning for potential slow-growing adenocarcinoma. Left lower lobe nodule which we treated is slightly smaller currently measuring 7 x 11 mm.  COMPLICATIONS OF TREATMENT: none  FOLLOW UP COMPLIANCE: keeps appointments   PHYSICAL EXAM:  BP 138/77 (BP Location: Left Arm, Patient Position: Sitting)   Pulse 67   Temp (!) 95.4 F (35.2 C) (Tympanic)   Resp 18   Wt 178 lb 14.5 oz (81.1 kg)   BMI 26.42 kg/m  Well-developed well-nourished patient in NAD. HEENT reveals PERLA, EOMI, discs not visualized.  Oral cavity is clear. No oral mucosal lesions are identified. Neck is clear without evidence of cervical or supraclavicular adenopathy. Lungs are clear to A&P. Cardiac examination is essentially unremarkable with regular rate and rhythm without murmur rub or thrill. Abdomen is benign with no organomegaly or masses noted. Motor sensory and DTR levels are equal and symmetric in the upper and lower extremities. Cranial nerves II through XII are grossly intact. Proprioception is intact. No peripheral adenopathy or edema is identified. No motor or sensory levels are noted. Crude visual fields are within normal range.  RADIOLOGY RESULTS: CT scans are reviewed and compatible with the above-stated  findings  PLAN: at the present time patient is doing well. I am comfortable continue to monitor and repeating his CT scan in 6 months should this area of the right upper lobe continue to grow would offer SB RT treatment to that at that time. Patient is comfortable with my treatment decision and treatment planning. Follow-up in 6 months with CT scan prior to visit was arranged. Patient is to call sooner with any concerns.  I would like to take this opportunity to thank you for allowing me to participate in the care of your patient.[name], MD

## 2018-03-03 NOTE — Progress Notes (Signed)
Hematology/Oncology Follow up note [facility] [phone] [phone]   Patient Care Team: Kathrine Haddock, NP as PCP - General (Nurse Practitioner) Beverly Gust, MD (Otolaryngology) Telford Nab, RN as Registered Nurse  REFERRING PROVIDER: Kathrine Haddock, NP REASON FOR VISIT Follow up for lung nodules and presumed lung cancer s/p SBRT  HISTORY OF PRESENTING ILLNESS:  Philip Wood is a  72 y.o.  male with PMH listed below who was referred to me for evaluation of lung nodule.  Patient follows up with primary care physician and reports significant unintentional weight loss for the past couple of months.   he has history of 31 pack a day smoking history, current smoker so he was referred to lung cancer screening clinic and had a CT chest scan done.  Image Study  CT chest wo 07/21/2017  1. Right upper and left lower lobe nodules, worrisome for synchronous bronchogenic carcinomas. Lung-RADS 4B, suspicious. Additional imaging evaluation or consultation with Pulmonology or Thoracic Surgery recommended. These results will be called to the ordering clinician or representative by the Radiologist Assistant, and communication documented in the PACS or zVision Dashboard. 2. Aortic atherosclerosis (ICD10-170.0). Coronary artery calcification. 3. Ascending aortic aneurysm NOS (ICD10-I71.9). Recommend annual imaging followup by CTA or MRA. This recommendation follows 2010 ACCF/AHA/AATS/ACR/ASA/SCA/SCAI/SIR/STS/SVM Guidelines for the Diagnosis and Management of Patients with Thoracic Aortic Disease. Circulation. 2010; 121: W258-N277. 4.  Emphysema (ICD10-J43.9).  PET scan 07/29/2017  1. The 1.5 cm left lower lobe nodule and the 1.0 by 0.7 cm right upper lobe solid nodule are both hypermetabolic and suspicious for malignancy. In addition, there a 5 mm peribronchovascular nodule in the right upper lobe with an SUV which, although only 1.6,  is disproportionate to the small size of the lesion and accordingly concerning for malignancy. No abnormal hypermetabolic activity in the neck, abdomen/pelvis, or skeleton is identified. No hypermetabolic adenopathy in the chest. 2. Other imaging findings of potential clinical significance: Small mucous retention cyst in the right maxillary sinus. Aortic Atherosclerosis (ICD10-I70.0) and Emphysema (ICD10-J43.9). Coronary atherosclerosis. Prostatomegaly. Prominent stool throughout the colon favors constipation. Descending and proximal sigmoid colon diverticulosis.  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  He got definitive radiation to the larynx, and the patient has been doing annual checkup with ENT Dr. Tami Ribas.  Patient's case was discussed on tumor conference on 07/30/2017 and consensus recommendation is to proceed with ENB biopsy of both lung nodule. Patient present to clinic today accompanied by his wife.  He denies any shortness of breath, chest pain, abdominal pain, hemoptysis, headache   Or double vision.  He continues to smoke half a pack of cigarettes a day  # Pathology of biopsies showed no lesional cells identified. However he is a high risk patient and negative biopsy results do not exclude malignancy.  Discussed with RadOnc Dr.Chrystal. Patient underwent SBRT to the left lung lesion.    INTERVAL HISTORY Philip Wood is a 72 y.o. male who has above history reviewed by me today presents for follow-up visit for management of lung nodules and left lung nodule for presumed SBRT.   Reports doing well. Denies any cough, chest pain, weight loss, fever or chills. Chronic cough.  He still smokes some days.  Had CT chest done and presents to discuss results and further  management  plans.    Review of Systems  Constitutional: Negative for chills, fever, malaise/fatigue and weight loss.  HENT: Negative for congestion, ear discharge, ear pain, nosebleeds, sinus pain and sore throat.   Eyes: Negative for double vision, photophobia, pain, discharge and redness.  Respiratory: Positive for cough. Negative for hemoptysis, sputum production, shortness of breath and wheezing.   Cardiovascular: Negative for chest pain, palpitations, orthopnea, claudication and leg swelling.  Gastrointestinal: Negative for abdominal pain, blood in stool, constipation, diarrhea, heartburn, melena, nausea and vomiting.  Genitourinary: Negative for dysuria, flank pain, frequency, hematuria and urgency.  Musculoskeletal: Negative for back pain, myalgias and neck pain.  Skin: Negative for itching and rash.  Neurological: Negative for dizziness, tingling, tremors, speech change, focal weakness, weakness and headaches.  Endo/Heme/Allergies: Negative for environmental allergies. Does not bruise/bleed easily.  Psychiatric/Behavioral: Negative for depression and hallucinations. The patient is not nervous/anxious.     MEDICAL HISTORY:  Past Medical History:  Diagnosis Date  . Anginal pain ([place])   . Cancer (HCC)    laryngeal  . Chronic kidney disease   . COPD (chronic obstructive pulmonary disease) ([place])   . Diabetes mellitus without complication ([place])   . GERD (gastroesophageal reflux disease)   . Hyperlipidemia   . Hypertension   . Lumbago   . Neuropathy   . Tobacco abuse disorder     SURGICAL HISTORY: Past Surgical History:  Procedure Laterality Date  . CHOLECYSTECTOMY    . ELECTROMAGNETIC NAVIGATION BROCHOSCOPY N/A 08/19/2017   Procedure: ELECTROMAGNETIC NAVIGATION BRONCHOSCOPY;  Surgeon: Flora Lipps, MD;  Location: [facility] ORS;  Service: Cardiopulmonary;  Laterality: N/A;  . ELECTROMAGNETIC NAVIGATION BROCHOSCOPY Left 09/02/2017   Procedure: ELECTROMAGNETIC NAVIGATION BRONCHOSCOPY;  Surgeon:  Flora Lipps, MD;  Location: [facility] ORS;  Service: Cardiopulmonary;  Laterality: Left;  . THROAT SURGERY      SOCIAL HISTORY: Social History   Socioeconomic History  . Marital status: Married    Spouse name: Not on file  . Number of children: Not on file  . Years of education: Not on file  . Highest education level: Not on file  Occupational History  . Not on file  Social Needs  . Financial resource strain: Not hard at all  . Food insecurity:    Worry: Never true    Inability: Never true  . Transportation needs:    Medical: No    Non-medical: No  Tobacco Use  . Smoking status: Light Tobacco Smoker    Packs/day: 0.60    Years: 53.00    Pack years: 31.80    Types: Cigarettes  . Smokeless tobacco: Never Used  . Tobacco comment: Approx 6 cigs a week   Substance and Sexual Activity  . Alcohol use: No    Alcohol/week: 0.0 standard drinks  . Drug use: No  . Sexual activity: Not Currently  Lifestyle  . Physical activity:    Days per week: 0 days    Minutes per session: 0 min  . Stress: Not at all  Relationships  . Social connections:    Talks on phone: Twice a week    Gets together: Once a week    Attends religious service: More than 4 times per year    Active member of club or organization: No    Attends meetings of clubs or organizations: Never    Relationship status: Married  . Intimate partner violence:    Fear of current or ex partner: No    Emotionally abused: No    Physically  abused: No    Forced sexual activity: No  Other Topics Concern  . Not on file  Social History Narrative   Works part time at [employer]    FAMILY HISTORY: Family History  Problem Relation Age of Onset  . Heart disease Father   . Heart attack Father   . Brain cancer Sister   . Cervical cancer Sister   . Rectal cancer Brother   . Lung cancer Brother   . Lung cancer Brother   . Lung cancer Brother   . Stomach cancer Sister   . Lung cancer Brother   . Stomach cancer Sister   . Diabetes  Sister   . Lupus Sister     ALLERGIES:  has No Known Allergies.  MEDICATIONS:  Current Outpatient Medications  Medication Sig Dispense Refill  . allopurinol (ZYLOPRIM) 100 MG tablet Take 1 tablet (100 mg total) by mouth daily. 90 tablet 0  . aspirin 81 MG tablet Take 81 mg by mouth daily.    Marland Kitchen atorvastatin (LIPITOR) 40 MG tablet Take 1 tablet (40 mg total) by mouth daily. 90 tablet 3  . metFORMIN (GLUCOPHAGE) 500 MG tablet Take 1 tablet (500 mg total) by mouth 2 (two) times daily with a meal. 180 tablet 1  . metoprolol succinate (TOPROL XL) 25 MG 24 hr tablet Take 1 tablet (25 mg total) by mouth daily. 90 tablet 3  . ramipril (ALTACE) 10 MG capsule Take 1 capsule (10 mg total) by mouth daily. 90 capsule 3  . tiotropium (SPIRIVA HANDIHALER) 18 MCG inhalation capsule Place 1 capsule (18 mcg total) into inhaler and inhale daily. 90 capsule 1   No current facility-administered medications for this visit.      PHYSICAL EXAMINATION: ECOG PERFORMANCE STATUS: 1 - Symptomatic but completely ambulatory Vitals:   03/03/18 1343  BP: 129/80  Pulse: 71  Resp: 18  Temp: (!) 96.6 F (35.9 C)  SpO2: 97%   Filed Weights   03/03/18 1343  Weight: 178 lb 14.4 oz (81.1 kg)    Physical Exam  Constitutional: He is oriented to person, place, and time. No distress.  HENT:  Head: Normocephalic and atraumatic.  Mouth/Throat: Oropharynx is clear and moist. No oropharyngeal exudate.  Eyes: Pupils are equal, round, and reactive to light. Conjunctivae and EOM are normal. No scleral icterus.  Neck: Normal range of motion. Neck supple.  Cardiovascular: Normal rate, regular rhythm and normal heart sounds.  No murmur heard. Pulmonary/Chest: Effort normal. No respiratory distress. He has no wheezes. He has no rales. He exhibits no tenderness.  .  Decreased breath sound bilateral  Abdominal: Soft. Bowel sounds are normal. He exhibits no distension and no mass. There is no tenderness. There is no guarding.    Musculoskeletal: Normal range of motion. He exhibits no edema or deformity.  Lymphadenopathy:    He has no cervical adenopathy.  Neurological: He is alert and oriented to person, place, and time. No cranial nerve deficit. Coordination normal.  Skin: Skin is warm and dry. No rash noted. No erythema.  Psychiatric: He has a normal mood and affect. His behavior is normal. Thought content normal.     LABORATORY DATA:  I have reviewed the data as listed Lab Results  Component Value Date   WBC 6.1 07/17/2017   HGB 14.6 08/19/2017   HCT 43.0 08/19/2017   MCV 92 07/17/2017   PLT 166 07/17/2017   Recent Labs    07/17/17 1131  08/14/17 0956  10/29/17 0836 11/03/17 1209 02/12/18  1008  NA 142  --  141   < > 141 141 143  K 5.3*   < > 5.1   < > 5.0 4.6 5.1  CL 103  --  103   < > 106 107 102  CO2 27  --  25   < > 29 27 25   GLUCOSE 96  --  98   < > 107* 123* 92  BUN 14  --  18   < > 21 21 14   CREATININE 1.04  --  1.03   < > 1.20 1.11 1.10  CALCIUM 9.2  --  8.9   < > 9.2 8.9 9.5  GFRNONAA 72  --  73   < > 59* >60 67  GFRAA 83  --  84   < > >60 >60 77  PROT 5.8*  --  5.5*  --   --   --  5.8*  ALBUMIN 3.8  --  3.6  --   --   --  3.8  AST 9  --  12  --   --   --  9  ALT 8  --  9  --   --   --  8  ALKPHOS 90  --  86  --   --   --  90  BILITOT 0.5  --  0.4  --   --   --  0.4   < > = values in this interval not displayed.   RADIOGRAPHIC STUDIES: I have personally reviewed the radiological images as listed and agreed with the findings in the report.  11/25/2017 CT chest wo 10 x 11 mm left lower lobe pulmonary nodule decreased.  10 x 14 mm right lower lobe with decreasing solid component.  As above.  Aortic atherosclerosis and emphysema.  03/01/2018 CT chest wo Slight interval enlargement of what is now a 12 x 15 mm mixed solid and sub solid nodule in the right upper lobe (axial image 61 of series 3). This remains concerning for potential slow-growing neoplasm such as an  adenocarcinoma. 2. Previously noted left lower lobe nodule appears slightly smaller than the prior study, currently measuring 7 x 11 mm. 3. Mild diffuse bronchial wall thickening with mild to moderate centrilobular emphysema; imaging findings suggestive of underlying COPD. 4. Aortic atherosclerosis, in addition to left main and 3 vessel coronary artery disease. Assessment for potential risk factor modification, dietary therapy or pharmacologic therapy may be warranted, if clinically indicated. 5. There are calcifications of the aortic valve. Echocardiographic correlation for evaluation of potential valvular dysfunction may be warranted if clinically indicated.Aortic Atherosclerosis (ICD10-I70.0) and Emphysema ([id number]  ASSESSMENT & PLAN:  1. Lung nodules   2. Tobacco abuse   3. Squamous cell carcinoma of larynx ([place])    # CT chest was independently reviewed, and discussed with patient.  Right upper lobe nodule continues to grow slowly, similar size comparing to 3 and 6 months ago.  I agree with Dr.Chrystal and recommend repeat repeat CT scan in 6 months for surveillance.   # Smoking cessation was discussed with patient and cessation program information was given to patient.  # Squamous cell carcinoma of larynx, annual check up with ENT Dr.McQueen.   All questions were answered. The patient knows to call the clinic with any problems questions or concerns.  Follow up in 6 months Earlie Server, MD, PhD Hematology Oncology [facility] Pager- [phone] 03/03/2018

## 2018-03-03 NOTE — Progress Notes (Signed)
Patient here for follow up. States he feels well today.

## 2018-07-05 ENCOUNTER — Other Ambulatory Visit: Payer: Self-pay | Admitting: Unknown Physician Specialty

## 2018-07-05 NOTE — Telephone Encounter (Signed)
Requested medication (s) are due for refill today: Yes  Requested medication (s) are on the active medication list: Yes  Last refill:  02/12/18  Future visit scheduled: Yes  Notes to clinic:  Failed Uric Acid, last level in 2016, unable to refill     Requested Prescriptions  Pending Prescriptions Disp Refills   allopurinol (ZYLOPRIM) 100 MG tablet [Pharmacy Med Name: Allopurinol 100 MG Oral Tablet] 90 tablet 0    Sig: Take 1 tablet by mouth once daily     Endocrinology:  Gout Agents Failed - 07/05/2018  8:42 AM      Failed - Uric Acid in normal range and within 360 days    Uric Acid  Date Value Ref Range Status  12/22/2014 5.0 3.7 - 8.6 mg/dL Final    Comment:               Therapeutic target for gout patients: <6.0         Passed - Cr in normal range and within 360 days    Creatinine, Ser  Date Value Ref Range Status  02/12/2018 1.10 0.76 - 1.27 mg/dL Final         Passed - Valid encounter within last 12 months    Recent Outpatient Visits          4 months ago CKD (chronic kidney disease), stage III ([place])   [facility] Kathrine Haddock, NP   10 months ago Cellulitis of left upper extremity   [facility] Kathrine Haddock, NP   10 months ago Hypertensive chronic kidney disease, unspecified CKD stage   [facility] Kathrine Haddock, NP   11 months ago Weight loss   [facility] Kathrine Haddock, NP   1 year ago Need for influenza vaccination   [facility] Kathrine Haddock, NP      Future Appointments            In 2 weeks  [facility], [place]   In 1 month Cannady, [name], NP [employer]

## 2018-07-14 ENCOUNTER — Telehealth: Payer: Self-pay | Admitting: Unknown Physician Specialty

## 2018-07-14 NOTE — Telephone Encounter (Signed)
Spoke with patient by phone and received authorization to do his AWV on 07/19/2018 at 9:30 AM telephonically and to call phone number [phone].  Patient is asked if he can to have his vitals, if he has the equipment to do so, and to have a list of all medications and doctors patient is seeing.  Patient voiced understood.  Janace Hoard, Care Guide [phone].

## 2018-07-19 ENCOUNTER — Ambulatory Visit: Payer: Self-pay

## 2018-08-06 ENCOUNTER — Other Ambulatory Visit: Payer: Self-pay | Admitting: Nurse Practitioner

## 2018-08-06 DIAGNOSIS — M109 Gout, unspecified: Secondary | ICD-10-CM

## 2018-08-06 DIAGNOSIS — E785 Hyperlipidemia, unspecified: Secondary | ICD-10-CM

## 2018-08-06 DIAGNOSIS — E0822 Diabetes mellitus due to underlying condition with diabetic chronic kidney disease: Secondary | ICD-10-CM

## 2018-08-06 DIAGNOSIS — I129 Hypertensive chronic kidney disease with stage 1 through stage 4 chronic kidney disease, or unspecified chronic kidney disease: Secondary | ICD-10-CM

## 2018-08-12 ENCOUNTER — Other Ambulatory Visit: Payer: Medicare Other

## 2018-08-12 ENCOUNTER — Other Ambulatory Visit: Payer: Self-pay

## 2018-08-12 DIAGNOSIS — E0822 Diabetes mellitus due to underlying condition with diabetic chronic kidney disease: Secondary | ICD-10-CM

## 2018-08-12 DIAGNOSIS — I129 Hypertensive chronic kidney disease with stage 1 through stage 4 chronic kidney disease, or unspecified chronic kidney disease: Secondary | ICD-10-CM | POA: Diagnosis not present

## 2018-08-12 DIAGNOSIS — M109 Gout, unspecified: Secondary | ICD-10-CM | POA: Diagnosis not present

## 2018-08-12 DIAGNOSIS — E785 Hyperlipidemia, unspecified: Secondary | ICD-10-CM | POA: Diagnosis not present

## 2018-08-12 LAB — MICROALBUMIN, URINE WAIVED
Creatinine, Urine Waived: 50 mg/dL (ref 10–300)
Microalb, Ur Waived: 30 mg/L — ABNORMAL HIGH (ref 0–19)

## 2018-08-12 LAB — BAYER DCA HB A1C WAIVED: HB A1C (BAYER DCA - WAIVED): 5.7 % (ref <7.0)

## 2018-08-12 LAB — LIPID PANEL PICCOLO, WAIVED
Chol/HDL Ratio Piccolo,Waive: 3.1 mg/dL
Cholesterol Piccolo, Waived: 149 mg/dL (ref <200)
HDL Chol Piccolo, Waived: 48 mg/dL — ABNORMAL LOW (ref >59)
LDL Chol Calc Piccolo Waived: 79 mg/dL (ref <100)
Triglycerides Piccolo,Waived: 111 mg/dL (ref <150)
VLDL Chol Calc Piccolo,Waive: 22 mg/dL (ref <30)

## 2018-08-13 ENCOUNTER — Ambulatory Visit (INDEPENDENT_AMBULATORY_CARE_PROVIDER_SITE_OTHER): Payer: Medicare Other | Admitting: Nurse Practitioner

## 2018-08-13 ENCOUNTER — Encounter: Payer: Self-pay | Admitting: Nurse Practitioner

## 2018-08-13 DIAGNOSIS — J449 Chronic obstructive pulmonary disease, unspecified: Secondary | ICD-10-CM

## 2018-08-13 DIAGNOSIS — I5022 Chronic systolic (congestive) heart failure: Secondary | ICD-10-CM

## 2018-08-13 DIAGNOSIS — I130 Hypertensive heart and chronic kidney disease with heart failure and stage 1 through stage 4 chronic kidney disease, or unspecified chronic kidney disease: Secondary | ICD-10-CM

## 2018-08-13 DIAGNOSIS — C3411 Malignant neoplasm of upper lobe, right bronchus or lung: Secondary | ICD-10-CM

## 2018-08-13 DIAGNOSIS — E0822 Diabetes mellitus due to underlying condition with diabetic chronic kidney disease: Secondary | ICD-10-CM

## 2018-08-13 DIAGNOSIS — N183 Chronic kidney disease, stage 3 unspecified: Secondary | ICD-10-CM

## 2018-08-13 DIAGNOSIS — E785 Hyperlipidemia, unspecified: Secondary | ICD-10-CM

## 2018-08-13 DIAGNOSIS — F17219 Nicotine dependence, cigarettes, with unspecified nicotine-induced disorders: Secondary | ICD-10-CM

## 2018-08-13 LAB — COMPREHENSIVE METABOLIC PANEL
ALT: 8 IU/L (ref 0–44)
AST: 8 IU/L (ref 0–40)
Albumin/Globulin Ratio: 2.2 (ref 1.2–2.2)
Albumin: 4.1 g/dL (ref 3.7–4.7)
Alkaline Phosphatase: 101 IU/L (ref 39–117)
BUN/Creatinine Ratio: 12 (ref 10–24)
BUN: 14 mg/dL (ref 8–27)
Bilirubin Total: 0.4 mg/dL (ref 0.0–1.2)
CO2: 26 mmol/L (ref 20–29)
Calcium: 9.6 mg/dL (ref 8.6–10.2)
Chloride: 106 mmol/L (ref 96–106)
Creatinine, Ser: 1.13 mg/dL (ref 0.76–1.27)
GFR calc Af Amer: 75 mL/min/{1.73_m2} (ref >59)
GFR calc non Af Amer: 65 mL/min/{1.73_m2} (ref >59)
Globulin, Total: 1.9 g/dL (ref 1.5–4.5)
Glucose: 113 mg/dL — ABNORMAL HIGH (ref 65–99)
Potassium: 4.5 mmol/L (ref 3.5–5.2)
Sodium: 148 mmol/L — ABNORMAL HIGH (ref 134–144)
Total Protein: 6 g/dL (ref 6.0–8.5)

## 2018-08-13 LAB — URIC ACID: Uric Acid: 5.4 mg/dL (ref 3.7–8.6)

## 2018-08-13 MED ORDER — TIOTROPIUM BROMIDE MONOHYDRATE 18 MCG IN CAPS: 18.0000 ug | ORAL_CAPSULE | Freq: Every day | RESPIRATORY_TRACT | 1 refills | Status: DC

## 2018-08-13 MED ORDER — METFORMIN HCL 500 MG PO TABS: 500.0000 mg | ORAL_TABLET | Freq: Two times a day (BID) | ORAL | 1 refills | Status: DC

## 2018-08-13 MED ORDER — ALLOPURINOL 100 MG PO TABS: 100.0000 mg | ORAL_TABLET | Freq: Every day | ORAL | 2 refills | Status: DC

## 2018-08-13 NOTE — Patient Instructions (Signed)
COPD and Physical Activity  Chronic obstructive pulmonary disease (COPD) is a long-term (chronic) condition that affects the lungs. COPD is a general term that can be used to describe many different lung problems that cause lung swelling (inflammation) and limit airflow, including chronic bronchitis and emphysema.  The main symptom of COPD is shortness of breath, which makes it harder to do even simple tasks. This can also make it harder to exercise and be active. Talk with your health care provider about treatments to help you breathe better and actions you can take to prevent breathing problems during physical activity.  What are the benefits of exercising with COPD?  Exercising regularly is an important part of a healthy lifestyle. You can still exercise and do physical activities even though you have COPD. Exercise and physical activity improve your shortness of breath by increasing blood flow (circulation). This causes your heart to pump more oxygen through your body. Moderate exercise can improve your:  · Oxygen use.  · Energy level.  · Shortness of breath.  · Strength in your breathing muscles.  · Heart health.  · Sleep.  · Self-esteem and feelings of self-worth.  · Depression, stress, and anxiety levels.  Exercise can benefit everyone with COPD. The severity of your disease may affect how hard you can exercise, especially at first, but everyone can benefit. Talk with your health care provider about how much exercise is safe for you, and which activities and exercises are safe for you.  What actions can I take to prevent breathing problems during physical activity?  · Sign up for a pulmonary rehabilitation program. This type of program may include:  ? Education about lung diseases.  ? Exercise classes that teach you how to exercise and be more active while improving your breathing. This usually involves:  § Exercise using your lower extremities, such as a stationary bicycle.  § About 30 minutes of exercise, 2  to 5 times per week, for 6 to 12 weeks  § Strength training, such as push ups or leg lifts.  ? Nutrition education.  ? Group classes in which you can talk with others who also have COPD and learn ways to manage stress.  · If you use an oxygen tank, you should use it while you exercise. Work with your health care provider to adjust your oxygen for your physical activity. Your resting flow rate is different from your flow rate during physical activity.  · While you are exercising:  ? Take slow breaths.  ? Pace yourself and do not try to go too fast.  ? Purse your lips while breathing out. Pursing your lips is similar to a kissing or whistling position.  ? If doing exercise that uses a quick burst of effort, such as weight lifting:  § Breathe in before starting the exercise.  § Breathe out during the hardest part of the exercise (such as raising the weights).  Where to find support  You can find support for exercising with COPD from:  · Your health care provider.  · A pulmonary rehabilitation program.  · Your local health department or community health programs.  · Support groups, online or in-person. Your health care provider may be able to recommend support groups.  Where to find more information  You can find more information about exercising with COPD from:  · American Lung Association: lung.org.  · COPD Foundation: copdfoundation.org.  Contact a health care provider if:  · Your symptoms get worse.  · You   have chest pain.  · You have nausea.  · You have a fever.  · You have trouble talking or catching your breath.  · You want to start a new exercise program or a new activity.  Summary  · COPD is a general term that can be used to describe many different lung problems that cause lung swelling (inflammation) and limit airflow. This includes chronic bronchitis and emphysema.  · Exercise and physical activity improve your shortness of breath by increasing blood flow (circulation). This causes your heart to provide more  oxygen to your body.  · Contact your health care provider before starting any exercise program or new activity. Ask your health care provider what exercises and activities are safe for you.  This information is not intended to replace advice given to you by your health care provider. Make sure you discuss any questions you have with your health care provider.  Document Released: 04/30/2017 Document Revised: 04/30/2017 Document Reviewed: 04/30/2017  Elsevier Interactive Patient Education © 2019 Elsevier Inc.

## 2018-08-13 NOTE — Assessment & Plan Note (Signed)
Continue to collaborate with oncology.  Next CT for reassessment scheduled for May.

## 2018-08-13 NOTE — Assessment & Plan Note (Signed)
Chronic, ongoing.  BP slightly elevated at home today, at baseline often at goal per his report.  Recommend decreasing sodium intake and increasing oral water hydration.  Continue current medication regimen and adjust as needed.  Continue to collaborate with cardiology.

## 2018-08-13 NOTE — Assessment & Plan Note (Signed)
Chronic, ongoing.  Continue current medication regimen.  Recommend complete smoking cessation.  Consider addition of Albuterol at next visit as rescue inhaler.

## 2018-08-13 NOTE — Assessment & Plan Note (Signed)
Chronic, ongoing.  A1C 5.7%.  Continue current medication regimen and adjust as needed.

## 2018-08-13 NOTE — Assessment & Plan Note (Signed)
Chronic, ongoing.  Continue current medication regimen and collaboration with cardiology.

## 2018-08-13 NOTE — Assessment & Plan Note (Signed)
Chronic, ongoing.  Continue current medication regimen and adjust as needed.   

## 2018-08-13 NOTE — Assessment & Plan Note (Signed)
Chronic, ongoing.  Continue current medication regimen.  GFR 75 today.

## 2018-08-13 NOTE — Assessment & Plan Note (Signed)
I have recommended complete cessation of tobacco use. I have discussed various options available for assistance with tobacco cessation including over the counter methods (Nicotine gum, patch and lozenges). We also discussed prescription options (Chantix, Nicotine Inhaler / Nasal Spray). The patient is not interested in pursuing any prescription tobacco cessation options at this time.  

## 2018-08-13 NOTE — Progress Notes (Signed)
BP (!) 159/87 (BP Location: Left Arm, Patient Position: Sitting, Cuff Size: Normal)   Pulse 77   Temp 97.8 F (36.6 C) (Oral)   Ht 5\' 9"  (1.753 m)   Wt 180 lb (81.6 kg)   SpO2 97%   BMI 26.58 kg/m    Subjective:    Patient ID: Philip Wood, male    DOB: [birth year], 73 y.o.   MRN: [id number]  HPI: Philip Wood is a 73 y.o. male  Chief Complaint  Patient presents with  . Diabetes    6 month F/U. No Complaints.  . Hyperlipidemia  . Hypertension  . Chronic Kidney Disease  . Lung Cancer    . This visit was completed via telephone due to the restrictions of the COVID-19 pandemic. All issues as above were discussed and addressed but no physical exam was performed. If it was felt that the patient should be evaluated in the office, they were directed there. The patient verbally consented to this visit. Patient was unable to complete an audio/visual visit due to Lack of equipment. Due to the catastrophic nature of the COVID-19 pandemic, this visit was done through audio contact only. . Location of the patient: home . Location of the provider: home . Those involved with this call:  . Provider: Marnee Guarneri, DNP . CMA: Merilyn Baba, CMA . Front Desk/Registration: Don Perking  . Time spent on call: 15 minutes on the phone discussing health concerns. 10 minutes total spent in review of patient's record and preparation of their chart.   DIABETES Currently takes Metformin 500 MG twice a day.  Current A1C 5.7%.   Hypoglycemic episodes:no Polydipsia/polyuria: no Visual disturbance: no Chest pain: no Paresthesias: no Glucose Monitoring: no  Accucheck frequency: Not Checking  Fasting glucose:  Post prandial:  Evening:  Before meals: Taking Insulin?: no  Long acting insulin:  Short acting insulin: Blood Pressure Monitoring: rarely Retinal Examination: Up to Date Foot Exam: Up to Date Pneumovax: Up to Date Influenza: Up to Date Aspirin: yes   HYPERTENSION /  HYPERLIPIDEMIA Current treatment regimen includes Metoprolol, Ramipril, and Atorvastatin.  Had echo performed on 08/27/2017 noting EF 45-50% and mildly reduced systolic function.  Followed by cardiology, Dr. Fletcher Anon last seen 01/01/18. BP slightly elevated today from his reported normal readings at home.  He reports eating "some" higher sodium foods lately.  Sodium on labs slightly elevated at 148.  Recommended decreasing sodium intake and increasing oral water intake.   Satisfied with current treatment? yes Duration of hypertension: chronic BP monitoring frequency: rarely BP range: 130-140/80's when does check BP medication side effects: no Duration of hyperlipidemia: chronic Cholesterol medication side effects: no Cholesterol supplements: none Medication compliance: good compliance Aspirin: yes Recent stressors: no Recurrent headaches: no Visual changes: no Palpitations: no Dyspnea: no Chest pain: no Lower extremity edema: no Dizzy/lightheaded: no   CHRONIC KIDNEY DISEASE Current labs note microalbumin 30 and M:C 30-300, CRT 1.13, BUN 14, and GFR 75.  He remains on Ramipril for kidney protection. CKD status: stable Medications renally dose: yes Previous renal evaluation: no Pneumovax:  Up to Date Influenza Vaccine:  Up to Date   LUNG CANCER: Has a 31 pack a day smoking history.  Was seen last by oncology, Dr. Tasia Catchings on 03/03/2018.   Recent CT of chest noted right upper lobe nodule which "continues to grow slowly".  He is to have repeat CT in May.  Has history of squamous cell carcinoma of larynx.  No current treatments for lung  nodules, monitoring per oncology.  He reports his cough is at baseline, not increased.    COPD On daily Spiriva.  Continues to smoke 10 cigarettes a day. COPD status: stable Satisfied with current treatment?: yes Oxygen use: no Dyspnea frequency:  Cough frequency:  Rescue inhaler frequency:  none Limitation of activity: occasional, but only if he is doing  something fast Productive cough:  Last Spirometry:  Pneumovax: Up to Date Influenza: Up to Date  Relevant past medical, surgical, family and social history reviewed and updated as indicated. Interim medical history since our last visit reviewed. Allergies and medications reviewed and updated.  Review of Systems  Constitutional: Negative for activity change, diaphoresis, fatigue and fever.  Respiratory: Negative for cough, chest tightness, shortness of breath and wheezing.   Cardiovascular: Negative for chest pain, palpitations and leg swelling.  Gastrointestinal: Negative for abdominal distention, abdominal pain, constipation, diarrhea, nausea and vomiting.  Endocrine: Negative for cold intolerance, heat intolerance, polydipsia, polyphagia and polyuria.  Musculoskeletal: Negative.   Skin: Negative.   Neurological: Negative for dizziness, syncope, weakness, light-headedness, numbness and headaches.  Psychiatric/Behavioral: Negative.     Per HPI unless specifically indicated above     Objective:    BP (!) 159/87 (BP Location: Left Arm, Patient Position: Sitting, Cuff Size: Normal)   Pulse 77   Temp 97.8 F (36.6 C) (Oral)   Ht 5\' 9"  (1.753 m)   Wt 180 lb (81.6 kg)   SpO2 97%   BMI 26.58 kg/m   Wt Readings from Last 3 Encounters:  08/13/18 180 lb (81.6 kg)  03/03/18 178 lb 14.5 oz (81.1 kg)  03/03/18 178 lb 14.4 oz (81.1 kg)    Physical Exam   Unable to perform visual exam due to patient with no virtual access.  Results for orders placed or performed in visit on 08/12/18  Microalbumin, Urine Waived  Result Value Ref Range   Microalb, Ur Waived 30 (H) 0 - 19 mg/L   Creatinine, Urine Waived 50 10 - 300 mg/dL   Microalb/Creat Ratio 30-300 (H) <30 mg/g  Uric acid  Result Value Ref Range   Uric Acid 5.4 3.7 - 8.6 mg/dL  Lipid Panel Piccolo, Waived  Result Value Ref Range   Cholesterol Piccolo, Waived 149 <200 mg/dL   HDL Chol Piccolo, Waived 48 (L) >59 mg/dL    Triglycerides Piccolo,Waived 111 <150 mg/dL   Chol/HDL Ratio Piccolo,Waive 3.1 mg/dL   LDL Chol Calc Piccolo Waived 79 <100 mg/dL   VLDL Chol Calc Piccolo,Waive 22 <30 mg/dL  Comprehensive metabolic panel  Result Value Ref Range   Glucose 113 (H) 65 - 99 mg/dL   BUN 14 8 - 27 mg/dL   Creatinine, Ser 1.13 0.76 - 1.27 mg/dL   GFR calc non Af Amer 65 >59 mL/min/1.73   GFR calc Af Amer 75 >59 mL/min/1.73   BUN/Creatinine Ratio 12 10 - 24   Sodium 148 (H) 134 - 144 mmol/L   Potassium 4.5 3.5 - 5.2 mmol/L   Chloride 106 96 - 106 mmol/L   CO2 26 20 - 29 mmol/L   Calcium 9.6 8.6 - 10.2 mg/dL   Total Protein 6.0 6.0 - 8.5 g/dL   Albumin 4.1 3.7 - 4.7 g/dL   Globulin, Total 1.9 1.5 - 4.5 g/dL   Albumin/Globulin Ratio 2.2 1.2 - 2.2   Bilirubin Total 0.4 0.0 - 1.2 mg/dL   Alkaline Phosphatase 101 39 - 117 IU/L   AST 8 0 - 40 IU/L   ALT 8  0 - 44 IU/L  Bayer DCA Hb A1c Waived  Result Value Ref Range   HB A1C (BAYER DCA - WAIVED) 5.7 <7.0 %      Assessment & Plan:   Problem List Items Addressed This Visit      Cardiovascular and Mediastinum   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  BP slightly elevated at home today, at baseline often at goal per his report.  Recommend decreasing sodium intake and increasing oral water hydration.  Continue current medication regimen and adjust as needed.  Continue to collaborate with cardiology.       Chronic systolic heart failure (HCC)    Chronic, ongoing.  Continue current medication regimen and collaboration with cardiology.        Respiratory   COPD (chronic obstructive pulmonary disease) (HCC)    Chronic, ongoing.  Continue current medication regimen.  Recommend complete smoking cessation.  Consider addition of Albuterol at next visit as rescue inhaler.      Relevant Medications   tiotropium (SPIRIVA HANDIHALER) 18 MCG inhalation capsule   Lung cancer ([place])    Continue to collaborate  with oncology.  Next CT for reassessment scheduled for May.      Relevant Medications   allopurinol (ZYLOPRIM) 100 MG tablet     Endocrine   Diabetes mellitus with chronic kidney disease (HCC)    Chronic, ongoing.  A1C 5.7%.  Continue current medication regimen and adjust as needed.      Relevant Medications   metFORMIN (GLUCOPHAGE) 500 MG tablet     Nervous and Auditory   Nicotine dependence, cigarettes, w unsp disorders    I have recommended complete cessation of tobacco use. I have discussed various options available for assistance with tobacco cessation including over the counter methods (Nicotine gum, patch and lozenges). We also discussed prescription options (Chantix, Nicotine Inhaler / Nasal Spray). The patient is not interested in pursuing any prescription tobacco cessation options at this time.        Genitourinary   CKD (chronic kidney disease), stage III (HCC)    Chronic, ongoing.  Continue current medication regimen.  GFR 75 today.        Other   Hyperlipidemia    Chronic, ongoing.  Continue current medication regimen and adjust as needed.         I discussed the assessment and treatment plan with the patient. The patient was provided an opportunity to ask questions and all were answered. The patient agreed with the plan and demonstrated an understanding of the instructions.   The patient was advised to call back or seek an in-person evaluation if the symptoms worsen or if the condition fails to improve as anticipated.   I provided 15 minutes of time during this encounter.  Follow up plan: Return in about 3 months (around 11/12/2018) for T2DM, HTN/HLD, COPD, Lung CA.

## 2018-08-16 ENCOUNTER — Telehealth: Payer: Self-pay | Admitting: Nurse Practitioner

## 2018-08-16 DIAGNOSIS — E870 Hyperosmolality and hypernatremia: Secondary | ICD-10-CM

## 2018-08-16 NOTE — Telephone Encounter (Signed)
2 week lab check to recheck sodium only as it was slightly elevated.  I want him to continue to increase his oral water hydration and will recheck sodium level only in 2 weeks.

## 2018-08-16 NOTE — Telephone Encounter (Signed)
Called patient to schedule labs for 2 weeks and he states he does not need labs just had.  Please advise Thank you

## 2018-08-17 NOTE — Telephone Encounter (Signed)
Lab scheduled 5/12 @ 830

## 2018-08-23 ENCOUNTER — Other Ambulatory Visit: Payer: Self-pay

## 2018-08-23 ENCOUNTER — Ambulatory Visit
Admission: RE | Admit: 2018-08-23 | Discharge: 2018-08-23 | Disposition: A | Discharge status: Home / Self Care | Payer: Medicare Other | Source: Ambulatory Visit | Attending: Radiation Oncology | Admitting: Radiation Oncology

## 2018-08-23 DIAGNOSIS — R911 Solitary pulmonary nodule: Secondary | ICD-10-CM | POA: Insufficient documentation

## 2018-08-23 DIAGNOSIS — C3490 Malignant neoplasm of unspecified part of unspecified bronchus or lung: Secondary | ICD-10-CM | POA: Diagnosis not present

## 2018-08-23 IMAGING — CT CT CHEST WITH CONTRAST
2 of 4 series · 15 of 36 positions shown, 18 images · IV contrast (omnipaque)
Comparison: [DATE]

CLINICAL DATA: Followup non-small cell lung carcinoma. Status post
radiation therapy.

EXAM:
CT CHEST WITH CONTRAST
TECHNIQUE: Multidetector CT imaging of the chest was performed during
intravenous contrast administration.
CONTRAST:  75mL OMNIPAQUE IOHEXOL 300 MG/ML  SOLN

[Series 2: axial chest · axial · 0.69mm/px · z∈[-1299,-991]mm · 12 of 182 slices shown, 15 images]
[im 14/182  mediastinal]
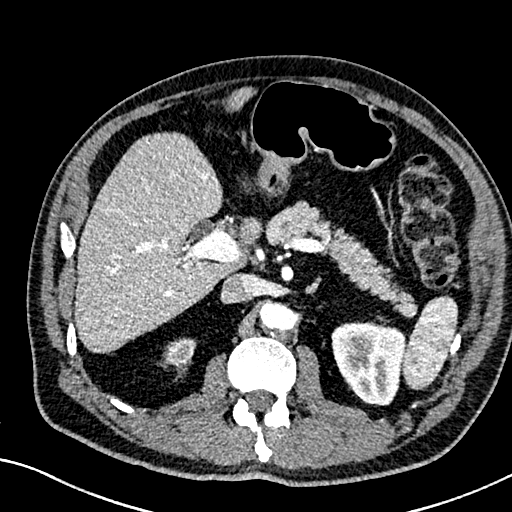
[im 14/182  lung]
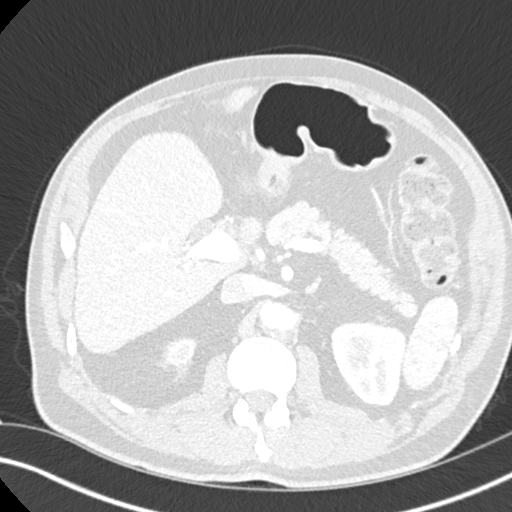
[im 28/182  lung]
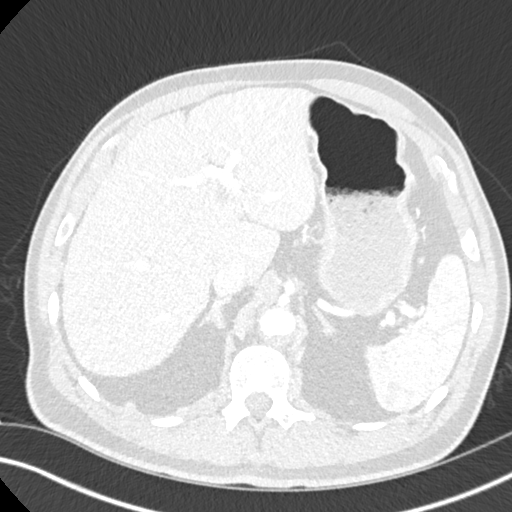
[im 42/182  lung]
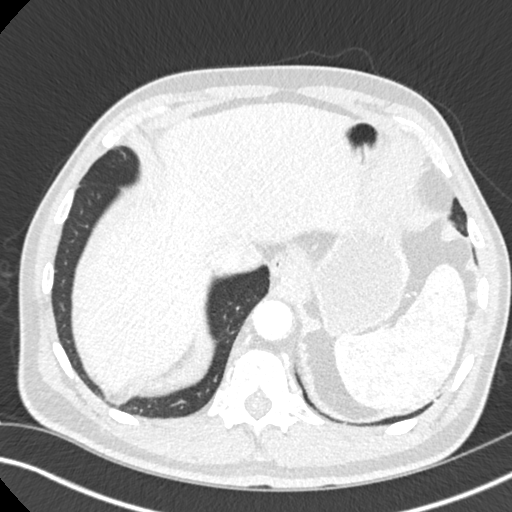
[im 56/182  lung]
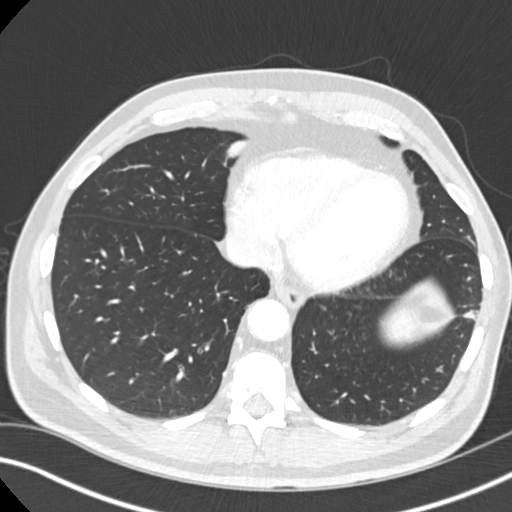
[im 70/182  mediastinal]
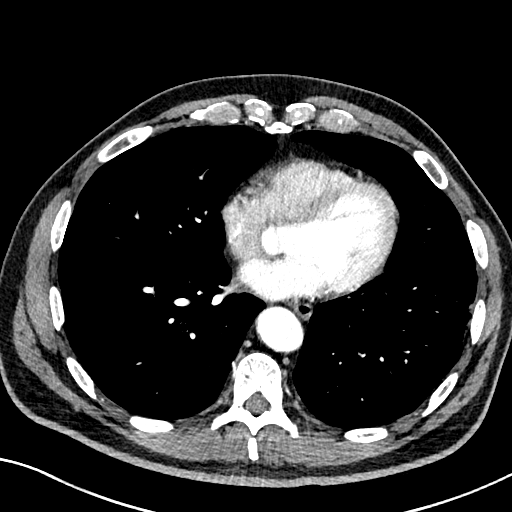
[im 70/182  lung]
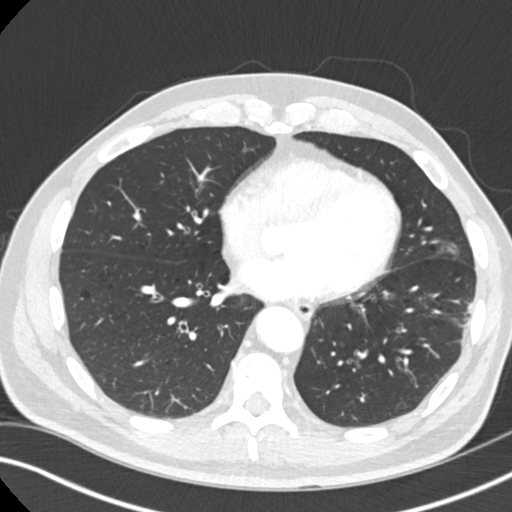
[im 84/182  lung]
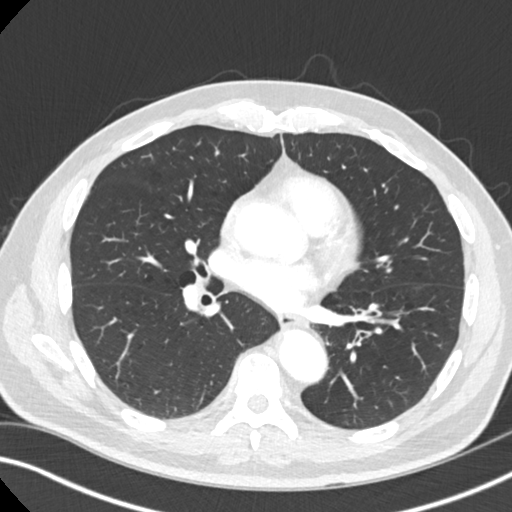
[im 98/182  lung]
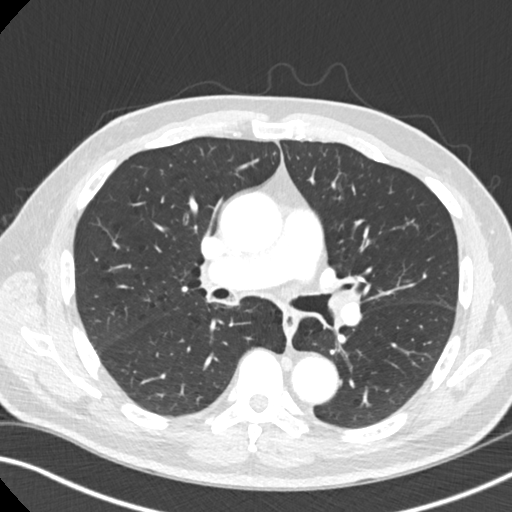
[im 112/182  lung]
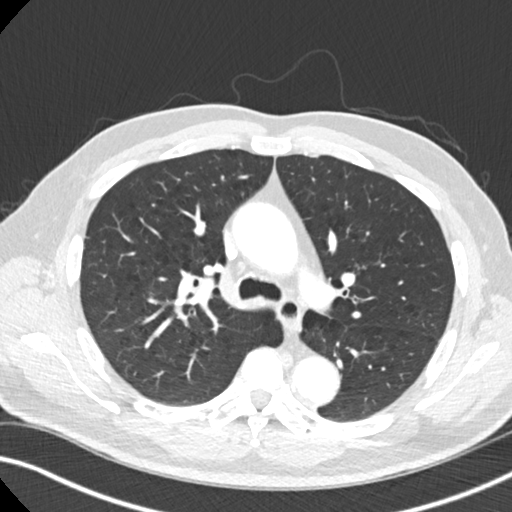
[im 126/182  mediastinal]
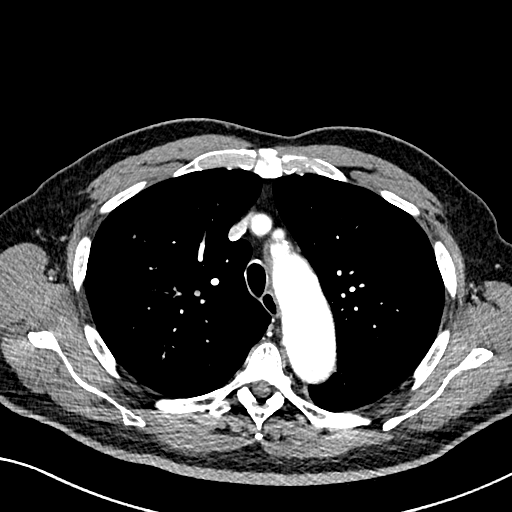
[im 126/182  lung]
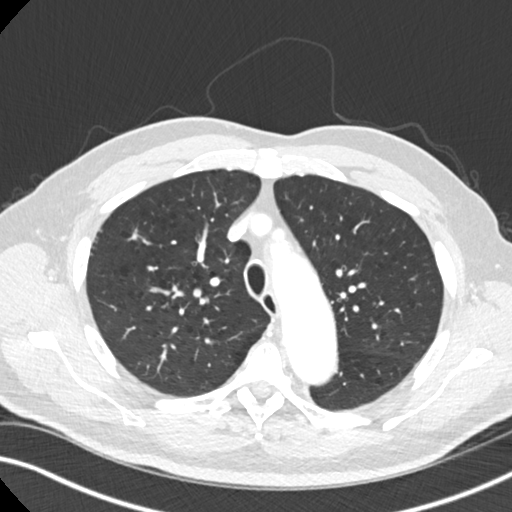
[im 140/182  lung]
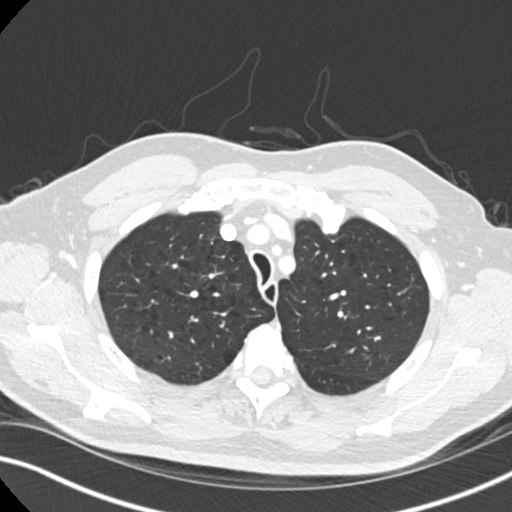
[im 154/182  lung]
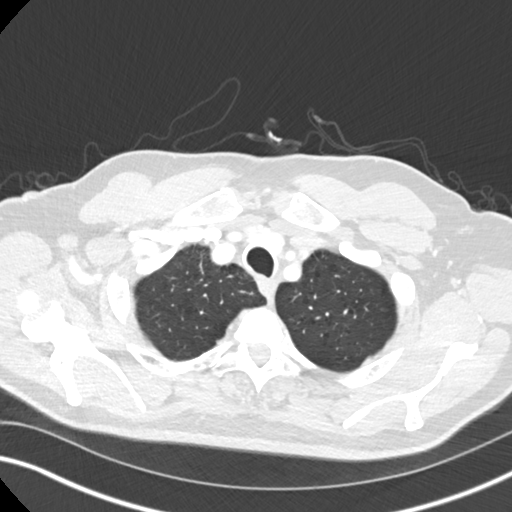
[im 168/182  lung]
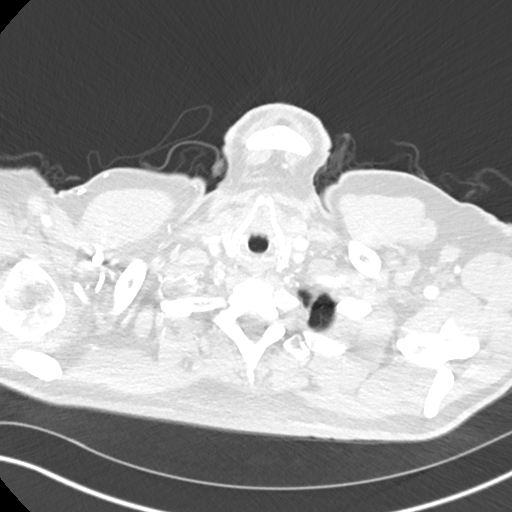

[Series 4: coronal chest · coronal · 0.69mm/px · 3 of 166 slices shown]
[im 34/166  lung]
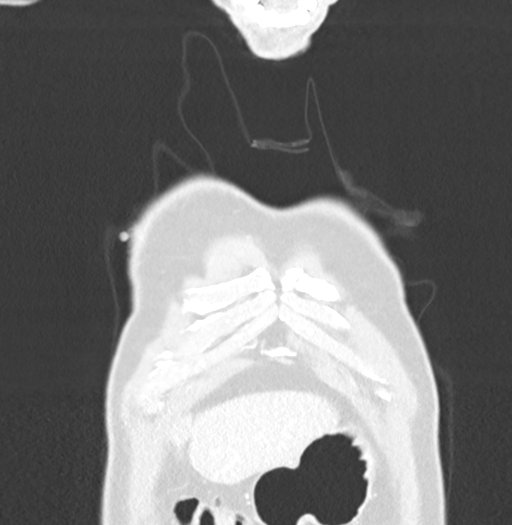
[im 67/166  lung]
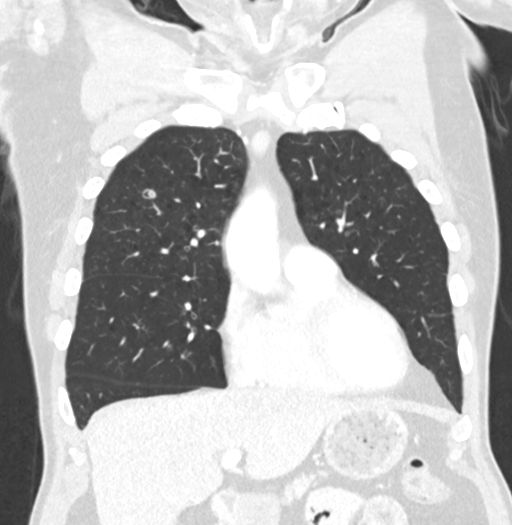
[im 100/166  lung]
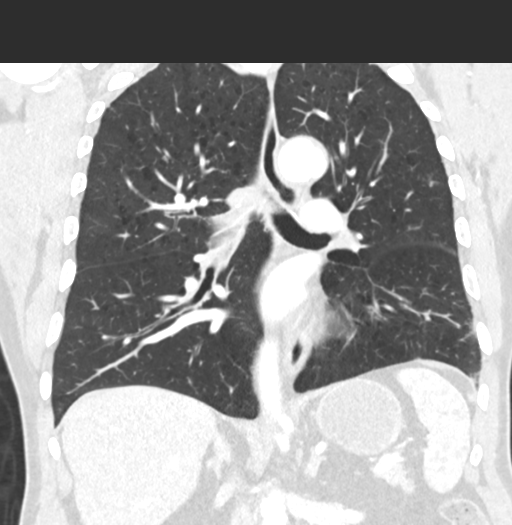

[15 of 36 positions shown; findings below may reference images not displayed]

FINDINGS: Cardiovascular: No acute findings. Aortic and coronary artery
atherosclerosis.

Mediastinum/Nodes: No masses or pathologically enlarged lymph nodes
identified.

Lungs/Pleura: Stable mild-to-moderate centrilobular emphysema. Small
thin walled cystic lesion in the anterior right upper lobe shows
resolution of mural nodularity since previous study. Left lower lobe
pulmonary nodule measures 10 x 6 mm on image [DATE], compared with 11
x 7 mm previously. A 4 mm pulmonary nodule in the anterior right
upper lobe is also stable.

No new or enlarging pulmonary nodules or masses identified.
Pleural-parenchymal scarring in the lateral left lower lobe remains
stable. No evidence of pulmonary infiltrate or pleural effusion.

Upper Abdomen:  Unremarkable.

Musculoskeletal:  No suspicious bone lesions.
IMPRESSION: 1. Decreased size of left lower lobe solid pulmonary nodule, and
small mural nodular density associated with cystic right upper lobe
lesion.
2. No new or progressive disease within the thorax.

Aortic Atherosclerosis ([TF]-[TF]) and Emphysema ([TF]-[TF]).
Coronary artery atherosclerosis.

## 2018-08-23 MED ORDER — IOHEXOL 300 MG/ML  SOLN
75.0000 mL | Freq: Once | INTRAMUSCULAR | Status: AC | PRN
  Administered 2018-08-23: 75 mL via INTRAVENOUS

## 2018-08-25 ENCOUNTER — Other Ambulatory Visit: Payer: Self-pay

## 2018-08-25 ENCOUNTER — Inpatient Hospital Stay: Payer: Medicare Other | Attending: Oncology | Admitting: Oncology

## 2018-08-25 ENCOUNTER — Encounter: Payer: Self-pay | Admitting: Oncology

## 2018-08-25 DIAGNOSIS — C3490 Malignant neoplasm of unspecified part of unspecified bronchus or lung: Secondary | ICD-10-CM

## 2018-08-25 DIAGNOSIS — C329 Malignant neoplasm of larynx, unspecified: Secondary | ICD-10-CM

## 2018-08-25 DIAGNOSIS — Z79899 Other long term (current) drug therapy: Secondary | ICD-10-CM

## 2018-08-25 DIAGNOSIS — F17210 Nicotine dependence, cigarettes, uncomplicated: Secondary | ICD-10-CM | POA: Diagnosis not present

## 2018-08-25 DIAGNOSIS — Z720 Tobacco use: Secondary | ICD-10-CM

## 2018-08-25 DIAGNOSIS — Z7982 Long term (current) use of aspirin: Secondary | ICD-10-CM | POA: Diagnosis not present

## 2018-08-25 NOTE — Progress Notes (Signed)
Called patient for Telehealth visit via Telephone.  Patient states no new concerns today

## 2018-08-25 NOTE — Progress Notes (Signed)
HEMATOLOGY-ONCOLOGY TeleHEALTH VISIT PROGRESS NOTE  I connected with Philip Wood on 08/25/18 at  8:30 AM EDT by telephone visit and verified that I am speaking with the correct person using two identifiers. I discussed the limitations, risks, security and privacy concerns of performing an evaluation and management service by telemedicine and the availability of in-person appointments. I also discussed with the patient that there may be a patient responsible charge related to this service. The patient expressed understanding and agreed to proceed.   Other persons participating in the visit and their role in the encounter:  Geraldine Solar, [place], check in patient     Patient's location: Home  Provider's location: Work Chief Complaint: Follow-up for lung nodules/ presumed lung cancer, status post SBRT.   INTERVAL HISTORY Philip Wood is a 73 y.o. male who has above history reviewed by me today presents for follow up visit for management of lung nodule/presumed lung cancer, status post SBRT, discussion of image results. Problems and complaints are listed below:  Patient reports feeling well at baseline.  Denies any shortness of breath, chest pain, abdominal pain, unintentional weight loss. Continue to smoke cigarettes. Today he has no new concerns.  He had CT scan done recently. Review of Systems  Constitutional: Positive for fatigue. Negative for appetite change, chills, fever and unexpected weight change.  HENT:   Negative for hearing loss and voice change.   Eyes: Negative for eye problems and icterus.  Respiratory: Negative for chest tightness, cough and shortness of breath.   Cardiovascular: Negative for chest pain and leg swelling.  Gastrointestinal: Negative for abdominal distention and abdominal pain.  Endocrine: Negative for hot flashes.  Genitourinary: Negative for difficulty urinating, dysuria and frequency.   Musculoskeletal: Negative for arthralgias.  Skin: Negative for  itching and rash.  Neurological: Negative for light-headedness and numbness.  Hematological: Negative for adenopathy. Does not bruise/bleed easily.  Psychiatric/Behavioral: Negative for confusion.    Past Medical History:  Diagnosis Date  . Anginal pain ([place])   . Cancer (HCC)    laryngeal  . Chronic kidney disease   . COPD (chronic obstructive pulmonary disease) ([place])   . Diabetes mellitus without complication ([place])   . GERD (gastroesophageal reflux disease)   . Hyperlipidemia   . Hypertension   . Lumbago   . Neuropathy   . Tobacco abuse disorder    Past Surgical History:  Procedure Laterality Date  . CHOLECYSTECTOMY    . ELECTROMAGNETIC NAVIGATION BROCHOSCOPY N/A 08/19/2017   Procedure: ELECTROMAGNETIC NAVIGATION BRONCHOSCOPY;  Surgeon: Flora Lipps, MD;  Location: [facility] ORS;  Service: Cardiopulmonary;  Laterality: N/A;  . ELECTROMAGNETIC NAVIGATION BROCHOSCOPY Left 09/02/2017   Procedure: ELECTROMAGNETIC NAVIGATION BRONCHOSCOPY;  Surgeon: Flora Lipps, MD;  Location: [facility] OR[facility];  Service: Cardiopulmonary;  Laterality: Left;  . THROAT SURGERY      Family History  Problem Relation Age of Onset  . Heart disease Father   . Heart attack Father   . Brain cancer Sister   . Cervical cancer Sister   . Rectal cancer Brother   . Lung cancer Brother   . Lung cancer Brother   . Lung cancer Brother   . Stomach cancer Sister   . Lung cancer Brother   . Stomach cancer Sister   . Diabetes Sister   . Lupus Sister     Social History   Socioeconomic History  . Marital status: Married    Spouse name: Not on file  . Number of children: Not on file  . Years  of education: Not on file  . Highest education level: Not on file  Occupational History  . Not on file  Social Needs  . Financial resource strain: Not hard at all  . Food insecurity:    Worry: Never true    Inability: Never true  . Transportation needs:    Medical: No    Non-medical: No  Tobacco Use  . Smoking status: Light  Tobacco Smoker    Packs/day: 0.60    Years: 53.00    Pack years: 31.80    Types: Cigarettes  . Smokeless tobacco: Never Used  . Tobacco comment: Approx 6 cigs a week   Substance and Sexual Activity  . Alcohol use: No    Alcohol/week: 0.0 standard drinks  . Drug use: No  . Sexual activity: Not Currently  Lifestyle  . Physical activity:    Days per week: 0 days    Minutes per session: 0 min  . Stress: Not at all  Relationships  . Social connections:    Talks on phone: Twice a week    Gets together: Once a week    Attends religious service: More than 4 times per year    Active member of club or organization: No    Attends meetings of clubs or organizations: Never    Relationship status: Married  . Intimate partner violence:    Fear of current or ex partner: No    Emotionally abused: No    Physically abused: No    Forced sexual activity: No  Other Topics Concern  . Not on file  Social History Narrative   Works part time at [employer]    Current Outpatient Medications on File Prior to Visit  Medication Sig Dispense Refill  . allopurinol (ZYLOPRIM) 100 MG tablet Take 1 tablet (100 mg total) by mouth daily. 90 tablet 2  . aspirin 81 MG tablet Take 81 mg by mouth daily.    Marland Kitchen atorvastatin (LIPITOR) 40 MG tablet Take 1 tablet (40 mg total) by mouth daily. 90 tablet 3  . metFORMIN (GLUCOPHAGE) 500 MG tablet Take 1 tablet (500 mg total) by mouth 2 (two) times daily with a meal. 180 tablet 1  . metoprolol succinate (TOPROL XL) 25 MG 24 hr tablet Take 1 tablet (25 mg total) by mouth daily. 90 tablet 3  . ramipril (ALTACE) 10 MG capsule Take 1 capsule (10 mg total) by mouth daily. 90 capsule 3  . tiotropium (SPIRIVA HANDIHALER) 18 MCG inhalation capsule Place 1 capsule (18 mcg total) into inhaler and inhale daily. 90 capsule 1   No current facility-administered medications on file prior to visit.     No Known Allergies     Observations/Objective: Today's Vitals   08/25/18 0829  PainSc:  0-No pain   There is no height or weight on file to calculate BMI.  Physical Exam  CBC    Component Value Date/Time   WBC 6.1 07/17/2017 1131   RBC 4.76 07/17/2017 1131   HGB 14.6 08/19/2017 1202   HGB 15.1 07/17/2017 1131   HCT 43.0 08/19/2017 1202   HCT 44.0 07/17/2017 1131   PLT 166 07/17/2017 1131   MCV 92 07/17/2017 1131   MCH 31.7 07/17/2017 1131   MCHC 34.3 07/17/2017 1131   RDW 13.8 07/17/2017 1131   LYMPHSABS 2.0 07/17/2017 1131   EOSABS 0.7 (H) 07/17/2017 1131   BASOSABS 0.0 07/17/2017 1131    CMP     Component Value Date/Time   NA 148 (H) 08/12/2018  0841   K 4.5 08/12/2018 0841   CL 106 08/12/2018 0841   CO2 26 08/12/2018 0841   GLUCOSE 113 (H) 08/12/2018 0841   GLUCOSE 123 (H) 11/03/2017 1209   BUN 14 08/12/2018 0841   CREATININE 1.13 08/12/2018 0841   CALCIUM 9.6 08/12/2018 0841   PROT 6.0 08/12/2018 0841   ALBUMIN 4.1 08/12/2018 0841   AST 8 08/12/2018 0841   ALT 8 08/12/2018 0841   ALKPHOS 101 08/12/2018 0841   BILITOT 0.4 08/12/2018 0841   GFRNONAA 65 08/12/2018 0841   GFRAA 75 08/12/2018 0841     Assessment and Plan: 1. Malignant neoplasm of lung, unspecified laterality, unspecified part of lung ([place])   2. Tobacco abuse   3. Squamous cell carcinoma of larynx (HCC)   Presumed stage I lung cancer-T1N10  CT chest with contrast 08/23/2018 was independently reviewed by me and discussed with patient. Interval decrease of left lower lobe solid pulmonary nodule and small mural nodular density associated with cystic right upper lobe lesion.  No new or progressive disease within the thorax. Recommend obtain CT chest in 6 months and follow-up in the clinic.  Smoke cessation discussed with patient again.  He is not interested. History of squamous cell carcinoma of larynx, recommend patient to continue annual checkup with ENT Dr. Tami Ribas.  Follow Up Instructions: CT chest in 6 months, lab MD assessment after CT scan. Orders Placed This Encounter   Procedures  . CT Chest Wo Contrast    Standing Status:   Future    Standing Expiration Date:   08/25/2019    Order Specific Question:   ** REASON FOR EXAM (FREE TEXT)    Answer:   lung nodule s/p SBRT, follow up    Order Specific Question:   Preferred imaging location?    Answer:   [facility]    Order Specific Question:   Radiology Contrast Protocol - do NOT remove file path    Answer:   [email or web address]  . CBC with Differential/Platelet    Standing Status:   Future    Standing Expiration Date:   08/25/2019  . Comprehensive metabolic panel    Standing Status:   Future    Standing Expiration Date:   08/25/2019    I discussed the assessment and treatment plan with the patient. The patient was provided an opportunity to ask questions and all were answered. The patient agreed with the plan and demonstrated an understanding of the instructions.  The patient was advised to call back or seek an in-person evaluation if the symptoms worsen or if the condition fails to improve as anticipated.   I provided 15 minutes of non face-to-face telephone visit time during this encounter, and > 50% was spent counseling as documented under my assessment & plan.  Earlie Server, MD 08/25/2018 8:43 AM

## 2018-08-26 ENCOUNTER — Ambulatory Visit: Payer: Medicare Other | Admitting: Oncology

## 2018-08-26 ENCOUNTER — Inpatient Hospital Stay: Payer: Medicare Other | Admitting: Oncology

## 2018-08-31 ENCOUNTER — Other Ambulatory Visit: Payer: Self-pay

## 2018-08-31 ENCOUNTER — Other Ambulatory Visit: Payer: Medicare Other

## 2018-08-31 DIAGNOSIS — E870 Hyperosmolality and hypernatremia: Secondary | ICD-10-CM

## 2018-08-31 NOTE — Addendum Note (Signed)
Addended by: Marnee Guarneri T on: 08/31/2018 08:35 AM   Modules accepted: Orders

## 2018-09-01 ENCOUNTER — Ambulatory Visit
Admission: RE | Admit: 2018-09-01 | Discharge: 2018-09-01 | Disposition: A | Discharge status: Home / Self Care | Payer: Medicare Other | Source: Ambulatory Visit | Attending: Radiation Oncology | Admitting: Radiation Oncology

## 2018-09-01 ENCOUNTER — Encounter: Payer: Self-pay | Admitting: Radiation Oncology

## 2018-09-01 ENCOUNTER — Other Ambulatory Visit: Payer: Self-pay

## 2018-09-01 VITALS — BP 161/88 | HR 47 | Temp 95.9°F | Resp 18 | Wt 180.8 lb

## 2018-09-01 DIAGNOSIS — C3432 Malignant neoplasm of lower lobe, left bronchus or lung: Secondary | ICD-10-CM | POA: Diagnosis not present

## 2018-09-01 DIAGNOSIS — Z923 Personal history of irradiation: Secondary | ICD-10-CM | POA: Diagnosis not present

## 2018-09-01 DIAGNOSIS — R918 Other nonspecific abnormal finding of lung field: Secondary | ICD-10-CM | POA: Insufficient documentation

## 2018-09-01 DIAGNOSIS — F17210 Nicotine dependence, cigarettes, uncomplicated: Secondary | ICD-10-CM | POA: Insufficient documentation

## 2018-09-01 DIAGNOSIS — R911 Solitary pulmonary nodule: Secondary | ICD-10-CM

## 2018-09-01 LAB — SODIUM: Sodium: 141 mmol/L (ref 134–144)

## 2018-09-01 NOTE — Progress Notes (Signed)
Radiation Oncology Follow up Note  Name: Philip Wood   Date:   09/01/2018 MRN:  [id number] DOB: 1946/03/16    This 73 y.o. male presents to the clinic today for 26-month follow-up status post SBRT to his left lung for stage I non-small cell lung cancer.  REFERRING PROVIDER: Kathrine Haddock, NP  HPI: Patient is a 73 year old male now about 10 months having completed SBRT to his left lung for stage I non-small cell lung cancer.  He is seen today in routine follow-up and is doing well.  He specifically denies cough hemoptysis chest tightness or any dysphasia.Marland Kitchen  He recently had a CT scan showing decreased size of left lower lobe pulmonary nodule as well as small nodular density associate with cystic right upper lobe lesion.  No new or progressive disease was noted.  COMPLICATIONS OF TREATMENT: none  FOLLOW UP COMPLIANCE: keeps appointments   PHYSICAL EXAM:  BP (!) 161/88   Pulse (!) 47   Temp (!) 95.9 F (35.5 C)   Resp 18   Wt 180 lb 12.4 oz (82 kg)   BMI 26.70 kg/m  Well-developed well-nourished patient in NAD. HEENT reveals PERLA, EOMI, discs not visualized.  Oral cavity is clear. No oral mucosal lesions are identified. Neck is clear without evidence of cervical or supraclavicular adenopathy. Lungs are clear to A&P. Cardiac examination is essentially unremarkable with regular rate and rhythm without murmur rub or thrill. Abdomen is benign with no organomegaly or masses noted. Motor sensory and DTR levels are equal and symmetric in the upper and lower extremities. Cranial nerves II through XII are grossly intact. Proprioception is intact. No peripheral adenopathy or edema is identified. No motor or sensory levels are noted. Crude visual fields are within normal range.  RADIOLOGY RESULTS: CT scans reviewed and compatible with above-stated findings  PLAN: Present time patient is doing well with no evidence of progressive disease by CT criteria to his chest.  I am pleased with his overall  progress.  I have asked to see him back in 1 year for follow-up.  Patient knows to call with any concerns at any time.  I would like to take this opportunity to thank you for allowing me to participate in the care of your patient.[name], MD

## 2018-09-15 ENCOUNTER — Telehealth: Payer: Self-pay

## 2018-09-15 NOTE — Telephone Encounter (Signed)
Patient scheduled for an AWV on 09/20/2018 with [facility], Due to Covid-19 pandemic this is unable to be done in office, called patient to see if they are able to do this virtually/telephonically.   Direct call back [phone]

## 2018-09-20 ENCOUNTER — Ambulatory Visit (INDEPENDENT_AMBULATORY_CARE_PROVIDER_SITE_OTHER): Payer: Medicare Other

## 2018-09-20 VITALS — Ht 70.0 in | Wt 180.0 lb

## 2018-09-20 DIAGNOSIS — Z0000 Encounter for general adult medical examination without abnormal findings: Secondary | ICD-10-CM

## 2018-09-20 NOTE — Patient Instructions (Signed)
[name] , Thank you for taking time to come for your Medicare Wellness Visit. I appreciate your ongoing commitment to your health goals. Please review the following plan we discussed and let me know if I can assist you in the future.   Screening recommendations/referrals: Colonoscopy: please pick up a fecal occult testing kit next time you are in the office.  Recommended yearly ophthalmology/optometry visit for glaucoma screening and checkup Recommended yearly dental visit for hygiene and checkup  Vaccinations: Influenza vaccine: up to date Pneumococcal vaccine: up to date Tdap vaccine: up to date Shingles vaccine: declined    Advanced directives: please pick up a copy of the paperwork next time you are in the office.   Conditions/risks identified: smoking cessation discussed.   Next appointment: follow up in one year for your annual wellness exam.   Preventive Care 65 Years and Older, Male Preventive care refers to lifestyle choices and visits with your health care provider that can promote health and wellness. What does preventive care include?  A yearly physical exam. This is also called an annual well check.  Dental exams once or twice a year.  Routine eye exams. Ask your health care provider how often you should have your eyes checked.  Personal lifestyle choices, including:  Daily care of your teeth and gums.  Regular physical activity.  Eating a healthy diet.  Avoiding tobacco and drug use.  Limiting alcohol use.  Practicing safe sex.  Taking low doses of aspirin every day.  Taking vitamin and mineral supplements as recommended by your health care provider. What happens during an annual well check? The services and screenings done by your health care provider during your annual well check will depend on your age, overall health, lifestyle risk factors, and family history of disease. Counseling  Your health care provider may ask you questions about your:   Alcohol use.  Tobacco use.  Drug use.  Emotional well-being.  Home and relationship well-being.  Sexual activity.  Eating habits.  History of falls.  Memory and ability to understand (cognition).  Work and work Statistician. Screening  You may have the following tests or measurements:  Height, weight, and BMI.  Blood pressure.  Lipid and cholesterol levels. These may be checked every 5 years, or more frequently if you are over 33 years old.  Skin check.  Lung cancer screening. You may have this screening every year starting at age 68 if you have a 30-pack-year history of smoking and currently smoke or have quit within the past 15 years.  Fecal occult blood test (FOBT) of the stool. You may have this test every year starting at age 68.  Flexible sigmoidoscopy or colonoscopy. You may have a sigmoidoscopy every 5 years or a colonoscopy every 10 years starting at age 77.  Prostate cancer screening. Recommendations will vary depending on your family history and other risks.  Hepatitis C blood test.  Hepatitis B blood test.  Sexually transmitted disease (STD) testing.  Diabetes screening. This is done by checking your blood sugar (glucose) after you have not eaten for a while (fasting). You may have this done every 1-3 years.  Abdominal aortic aneurysm (AAA) screening. You may need this if you are a current or former smoker.  Osteoporosis. You may be screened starting at age 71 if you are at high risk. Talk with your health care provider about your test results, treatment options, and if necessary, the need for more tests. Vaccines  Your health care provider may  recommend certain vaccines, such as:  Influenza vaccine. This is recommended every year.  Tetanus, diphtheria, and acellular pertussis (Tdap, Td) vaccine. You may need a Td booster every 10 years.  Zoster vaccine. You may need this after age 68.  Pneumococcal 13-valent conjugate (PCV13) vaccine. One dose is  recommended after age 35.  Pneumococcal polysaccharide (PPSV23) vaccine. One dose is recommended after age 14. Talk to your health care provider about which screenings and vaccines you need and how often you need them. This information is not intended to replace advice given to you by your health care provider. Make sure you discuss any questions you have with your health care provider. Document Released: 05/04/2015 Document Revised: 12/26/2015 Document Reviewed: 02/06/2015 Elsevier Interactive Patient Education  2017 [place] Prevention in the Home Falls can cause injuries. They can happen to people of all ages. There are many things you can do to make your home safe and to help prevent falls. What can I do on the outside of my home?  Regularly fix the edges of walkways and driveways and fix any cracks.  Remove anything that might make you trip as you walk through a door, such as a raised step or threshold.  Trim any bushes or trees on the path to your home.  Use bright outdoor lighting.  Clear any walking paths of anything that might make someone trip, such as rocks or tools.  Regularly check to see if handrails are loose or broken. Make sure that both sides of any steps have handrails.  Any raised decks and porches should have guardrails on the edges.  Have any leaves, snow, or ice cleared regularly.  Use sand or salt on walking paths during winter.  Clean up any spills in your garage right away. This includes oil or grease spills. What can I do in the bathroom?  Use night lights.  Install grab bars by the toilet and in the tub and shower. Do not use towel bars as grab bars.  Use non-skid mats or decals in the tub or shower.  If you need to sit down in the shower, use a plastic, non-slip stool.  Keep the floor dry. Clean up any water that spills on the floor as soon as it happens.  Remove soap buildup in the tub or shower regularly.  Attach bath mats  securely with double-sided non-slip rug tape.  Do not have throw rugs and other things on the floor that can make you trip. What can I do in the bedroom?  Use night lights.  Make sure that you have a light by your bed that is easy to reach.  Do not use any sheets or blankets that are too big for your bed. They should not hang down onto the floor.  Have a firm chair that has side arms. You can use this for support while you get dressed.  Do not have throw rugs and other things on the floor that can make you trip. What can I do in the kitchen?  Clean up any spills right away.  Avoid walking on wet floors.  Keep items that you use a lot in easy-to-reach places.  If you need to reach something above you, use a strong step stool that has a grab bar.  Keep electrical cords out of the way.  Do not use floor polish or wax that makes floors slippery. If you must use wax, use non-skid floor wax.  Do not have throw rugs and  other things on the floor that can make you trip. What can I do with my stairs?  Do not leave any items on the stairs.  Make sure that there are handrails on both sides of the stairs and use them. Fix handrails that are broken or loose. Make sure that handrails are as long as the stairways.  Check any carpeting to make sure that it is firmly attached to the stairs. Fix any carpet that is loose or worn.  Avoid having throw rugs at the top or bottom of the stairs. If you do have throw rugs, attach them to the floor with carpet tape.  Make sure that you have a light switch at the top of the stairs and the bottom of the stairs. If you do not have them, ask someone to add them for you. What else can I do to help prevent falls?  Wear shoes that:  Do not have high heels.  Have rubber bottoms.  Are comfortable and fit you well.  Are closed at the toe. Do not wear sandals.  If you use a stepladder:  Make sure that it is fully opened. Do not climb a closed  stepladder.  Make sure that both sides of the stepladder are locked into place.  Ask someone to hold it for you, if possible.  Clearly mark and make sure that you can see:  Any grab bars or handrails.  First and last steps.  Where the edge of each step is.  Use tools that help you move around (mobility aids) if they are needed. These include:  Canes.  Walkers.  Scooters.  Crutches.  Turn on the lights when you go into a dark area. Replace any light bulbs as soon as they burn out.  Set up your furniture so you have a clear path. Avoid moving your furniture around.  If any of your floors are uneven, fix them.  If there are any pets around you, be aware of where they are.  Review your medicines with your doctor. Some medicines can make you feel dizzy. This can increase your chance of falling. Ask your doctor what other things that you can do to help prevent falls. This information is not intended to replace advice given to you by your health care provider. Make sure you discuss any questions you have with your health care provider. Document Released: 02/01/2009 Document Revised: 09/13/2015 Document Reviewed: 05/12/2014 Elsevier Interactive Patient Education  2017 [employer].  If you wish to quit smoking, help is available. For free tobacco cessation program offerings call the [facility] at [phone] or Live Well Line at [phone]. You may also visit [email or web address] or email [email or web address] for more information on other programs.

## 2018-09-20 NOTE — Progress Notes (Signed)
Subjective:   Philip Wood is a 73 y.o. male who presents for Medicare Annual/Subsequent preventive examination.  This visit is being conducted via phone call  - after an attmept to do on video chat - due to the COVID-19 pandemic. This patient has given me verbal consent via phone to conduct this visit, patient states they are participating from their home address. Some vital signs may be absent or patient reported.   Patient identification: identified by name, DOB, and current address.    Review of Systems:   Cardiac Risk Factors include: advanced age (>69men, >74 women);male gender;diabetes mellitus;hypertension     Objective:    Vitals: Ht 5\' 10"  (1.778 m) Comment: patient reported  Wt 180 lb (81.6 kg) Comment: patient reported  BMI 25.83 kg/m   Body mass index is 25.83 kg/m.  Advanced Directives 09/20/2018 09/01/2018 08/25/2018 03/03/2018 03/03/2018 11/30/2017 11/27/2017  Does Patient Have a Medical Advance Directive? No No No No No No No  Would patient like information on creating a medical advance directive? - No - Patient declined - - No - Patient declined No - Patient declined No - Patient declined    Tobacco Social History   Tobacco Use  Smoking Status Light Tobacco Smoker  . Packs/day: 0.60  . Years: 53.00  . Pack years: 31.80  . Types: Cigarettes  Smokeless Tobacco Never Used  Tobacco Comment   Approx 6 cigs a day      Ready to quit: Yes Counseling given: Yes Comment: Approx 6 cigs a day    Clinical Intake:  Pre-visit preparation completed: Yes  Pain : No/denies pain     Nutritional Risks: None Diabetes: Yes CBG done?: No Did pt. bring in CBG monitor from home?: No  How often do you need to have someone help you when you read instructions, pamphlets, or other written materials from your doctor or pharmacy?: 1 - Never What is the last grade level you completed in school?: 10th grade  Nutrition Risk Assessment:  Has the patient had any N/V/D  within the last 2 months?  No  Does the patient have any non-healing wounds?  No  Has the patient had any unintentional weight loss or weight gain?  No   Diabetes:  Is the patient diabetic?  Yes  If diabetic, was a CBG obtained today?  No  Did the patient bring in their glucometer from home?  No  How often do you monitor your CBG's? Doesn't monitor at home.   Financial Strains and Diabetes Management:  Are you having any financial strains with the device, your supplies or your medication? No .  Does the patient want to be seen by Chronic Care Management for management of their diabetes?  No  Would the patient like to be referred to a Nutritionist or for Diabetic Management?  No   Diabetic Exams:  Diabetic Eye Exam: Completed 08/04/2017. Overdue for diabetic eye exam. Pt has been advised about the importance in completing this exam.   Diabetic Foot Exam: Completed 02/12/2018.   Interpreter Needed?: No  Information entered by :: Tiffany Hill,LPN   Past Medical History:  Diagnosis Date  . Anginal pain ([place])   . Cancer (HCC)    laryngeal  . Chronic kidney disease   . COPD (chronic obstructive pulmonary disease) ([place])   . Diabetes mellitus without complication ([place])   . GERD (gastroesophageal reflux disease)   . Hyperlipidemia   . Hypertension   . Lumbago   . Neuropathy   .  Tobacco abuse disorder    Past Surgical History:  Procedure Laterality Date  . CHOLECYSTECTOMY    . ELECTROMAGNETIC NAVIGATION BROCHOSCOPY N/A 08/19/2017   Procedure: ELECTROMAGNETIC NAVIGATION BRONCHOSCOPY;  Surgeon: Flora Lipps, MD;  Location: [facility] ORS;  Service: Cardiopulmonary;  Laterality: N/A;  . ELECTROMAGNETIC NAVIGATION BROCHOSCOPY Left 09/02/2017   Procedure: ELECTROMAGNETIC NAVIGATION BRONCHOSCOPY;  Surgeon: Flora Lipps, MD;  Location: [facility] ORS;  Service: Cardiopulmonary;  Laterality: Left;  . THROAT SURGERY     Family History  Problem Relation Age of Onset  . Heart disease Father   .  Heart attack Father   . Brain cancer Sister   . Cervical cancer Sister   . Rectal cancer Brother   . Lung cancer Brother   . Lung cancer Brother   . Lung cancer Brother   . Stomach cancer Sister   . Lung cancer Brother   . Stomach cancer Sister   . Diabetes Sister   . Lupus Sister    Social History   Socioeconomic History  . Marital status: Married    Spouse name: Not on file  . Number of children: Not on file  . Years of education: Not on file  . Highest education level: 10th grade  Occupational History  . Not on file  Social Needs  . Financial resource strain: Not hard at all  . Food insecurity:    Worry: Never true    Inability: Never true  . Transportation needs:    Medical: No    Non-medical: No  Tobacco Use  . Smoking status: Light Tobacco Smoker    Packs/day: 0.60    Years: 53.00    Pack years: 31.80    Types: Cigarettes  . Smokeless tobacco: Never Used  . Tobacco comment: Approx 6 cigs a day   Substance and Sexual Activity  . Alcohol use: No    Alcohol/week: 0.0 standard drinks  . Drug use: No  . Sexual activity: Not Currently  Lifestyle  . Physical activity:    Days per week: 0 days    Minutes per session: 0 min  . Stress: Not at all  Relationships  . Social connections:    Talks on phone: Twice a week    Gets together: Once a week    Attends religious service: More than 4 times per year    Active member of club or organization: No    Attends meetings of clubs or organizations: Never    Relationship status: Married  Other Topics Concern  . Not on file  Social History Narrative   Works part time at [employer]    Outpatient Encounter Medications as of 09/20/2018  Medication Sig  . allopurinol (ZYLOPRIM) 100 MG tablet Take 1 tablet (100 mg total) by mouth daily.  Marland Kitchen aspirin 81 MG tablet Take 81 mg by mouth daily.  Marland Kitchen atorvastatin (LIPITOR) 40 MG tablet Take 1 tablet (40 mg total) by mouth daily.  . metFORMIN (GLUCOPHAGE) 500 MG tablet Take 1 tablet (500  mg total) by mouth 2 (two) times daily with a meal.  . metoprolol succinate (TOPROL XL) 25 MG 24 hr tablet Take 1 tablet (25 mg total) by mouth daily.  . ramipril (ALTACE) 10 MG capsule Take 1 capsule (10 mg total) by mouth daily.  Marland Kitchen tiotropium (SPIRIVA HANDIHALER) 18 MCG inhalation capsule Place 1 capsule (18 mcg total) into inhaler and inhale daily.   No facility-administered encounter medications on file as of 09/20/2018.     Activities of Daily Living In  your present state of health, do you have any difficulty performing the following activities: 09/20/2018  Hearing? N  Vision? N  Difficulty concentrating or making decisions? N  Walking or climbing stairs? N  Dressing or bathing? N  Doing errands, shopping? N  Preparing Food and eating ? N  Using the Toilet? N  In the past six months, have you accidently leaked urine? N  Do you have problems with loss of bowel control? N  Managing your Medications? N  Managing your Finances? N  Housekeeping or managing your Housekeeping? N  Some recent data might be hidden    Patient Care Team: Venita Lick, NP as PCP - General (Nurse Practitioner) Beverly Gust, MD (Otolaryngology) Telford Nab, RN as Registered Nurse   Assessment:   This is a routine wellness examination for [place].  Exercise Activities and Dietary recommendations Current Exercise Habits: The patient does not participate in regular exercise at present, Exercise limited by: None identified  Goals    . Quit Smoking     Smoking cessation discussed    . Quit smoking / using tobacco       Fall Risk Fall Risk  09/20/2018 07/13/2017 07/11/2016 03/28/2015  Falls in the past year? 0 No No No   FALL RISK PREVENTION PERTAINING TO THE HOME:  Any stairs in or around the home? Yes  If so, are there any without handrails? No   Home free of loose throw rugs in walkways, pet beds, electrical cords, etc? Yes  Adequate lighting in your home to reduce risk of falls? Yes    ASSISTIVE DEVICES UTILIZED TO PREVENT FALLS:  Life alert? No  Use of a cane, walker or w/c? No  Grab bars in the bathroom? No  Shower chair or bench in shower? No  Elevated toilet seat or a handicapped toilet? No    TIMED UP AND GO:  Unable to perform    Depression Screen PHQ 2/9 Scores 09/20/2018 07/13/2017 07/11/2016 03/28/2015  PHQ - 2 Score 0 0 0 0  PHQ- 9 Score - - 0 -    Cognitive Function     6CIT Screen 09/20/2018 07/13/2017  What Year? 0 points 0 points  What month? 0 points 0 points  What time? 0 points 0 points  Count back from 20 0 points 0 points  Months in reverse 0 points 0 points  Repeat phrase 2 points 0 points  Total Score 2 0    Immunization History  Administered Date(s) Administered  . Influenza, High Dose Seasonal PF 01/11/2016, 01/12/2017, 02/12/2018  . Influenza,inj,Quad PF,6+ Mos 03/28/2015  . Pneumococcal Conjugate-13 08/12/2013  . Pneumococcal Polysaccharide-23 02/06/2012  . Td 11/24/2005  . Tdap 08/04/2012    Qualifies for Shingles Vaccine? Yes  Zostavax completed n/a. Due for Shingrix. Education has been provided regarding the importance of this vaccine. Pt has been advised to call insurance company to determine out of pocket expense. Advised may also receive vaccine at local pharmacy or Health Dept. Verbalized acceptance and understanding.  Tdap: up to date  Flu Vaccine: up to date   Pneumococcal Vaccine: up to date   Screening Tests Health Maintenance  Topic Date Due  . COLON CANCER SCREENING ANNUAL FOBT  06/22/2018  . OPHTHALMOLOGY EXAM  08/05/2018  . COLONOSCOPY  09/20/2019 (Originally 01/29/2016)  . INFLUENZA VACCINE  11/20/2018  . HEMOGLOBIN A1C  02/11/2019  . FOOT EXAM  02/13/2019  . TETANUS/TDAP  08/05/2022  . Hepatitis C Screening  Completed  . PNA vac  Low Risk Adult  Completed   Cancer Screenings:  Colorectal Screening: Completed 01/28/2006. Does early FOBT test. Patient will pick   Lung Cancer Screening: (Low Dose CT  Chest recommended if Age 13-80 years, 30 pack-year currently smoking OR have quit w/in 15years.) does not qualify.     Additional Screening:  Hepatitis C Screening: does qualify; Completed 03/28/2015  Dental Screening: Recommended annual dental exams for proper oral hygiene  Community Resource Referral:  CRR required this visit?  No        Plan:  I have personally reviewed and addressed the Medicare Annual Wellness questionnaire and have noted the following in the patient's chart:  A. Medical and social history B. Use of alcohol, tobacco or illicit drugs  C. Current medications and supplements D. Functional ability and status E.  Nutritional status F.  Physical activity G. Advance directives H. List of other physicians I.  Hospitalizations, surgeries, and ER visits in previous 12 months J.  [place] such as hearing and vision if needed, cognitive and depression L. Referrals and appointments   In addition, I have reviewed and discussed with patient certain preventive protocols, quality metrics, and best practice recommendations. A written personalized care plan for preventive services as well as general preventive health recommendations were provided to patient.   Signed,   Bevelyn Ngo, LPN  08/21/7480 Nurse Health Advisor  Nurse Notes: none

## 2018-10-04 ENCOUNTER — Telehealth: Payer: Self-pay

## 2018-10-04 NOTE — Telephone Encounter (Signed)
Virtual Visit Pre-Appointment Phone Call  "Philip Wood, I am calling you today to discuss your upcoming appointment. We are currently trying to limit exposure to the virus that causes COVID-19 by seeing patients at home rather than in the office."  1. "What is the BEST phone number to call the day of the visit?" - include this in appointment notes  2. "Do you have or have access to (through a family member/friend) a smartphone with video capability that we can use for your visit?" a. If yes - list this number in appt notes as "cell" (if different from BEST phone #) and list the appointment type as a VIDEO visit in appointment notes b. If no - list the appointment type as a PHONE visit in appointment notes  3. Confirm consent - "In the setting of the current Covid19 crisis, you are scheduled for a phone visit with your provider on 10/21/2018 at 11:00AM.  Just as we do with many in-office visits, in order for you to participate in this visit, we must obtain consent.  If you'd like, I can send this to your mychart (if signed up) or email for you to review.  Otherwise, I can obtain your verbal consent now.  All virtual visits are billed to your insurance company just like a normal visit would be.  By agreeing to a virtual visit, we'd like you to understand that the technology does not allow for your provider to perform an examination, and thus may limit your provider's ability to fully assess your condition. If your provider identifies any concerns that need to be evaluated in person, we will make arrangements to do so.  Finally, though the technology is pretty good, we cannot assure that it will always work on either your or our end, and in the setting of a video visit, we may have to convert it to a phone-only visit.  In either situation, we cannot ensure that we have a secure connection.  Are you willing to proceed?" STAFF: Did the patient verbally acknowledge consent to telehealth visit? Document YES/NO  here: YES  4. Advise patient to be prepared - "Two hours prior to your appointment, go ahead and check your blood pressure, pulse, oxygen saturation, and your weight (if you have the equipment to check those) and write them all down. When your visit starts, your provider will ask you for this information. If you have an Apple Watch or Kardia device, please plan to have heart rate information ready on the day of your appointment. Please have a pen and paper handy nearby the day of the visit as well."  5. Give patient instructions for MyChart download to smartphone OR Doximity/Doxy.me as below if video visit (depending on what platform provider is using)  6. Inform patient they will receive a phone call 15 minutes prior to their appointment time (may be from unknown caller ID) so they should be prepared to answer    TELEPHONE CALL NOTE  Philip Wood has been deemed a candidate for a follow-up tele-health visit to limit community exposure during the Covid-19 pandemic. I spoke with the patient via phone to ensure availability of phone/video source, confirm preferred email & phone number, and discuss instructions and expectations.  I reminded Philip Wood to be prepared with any vital sign and/or heart rhythm information that could potentially be obtained via home monitoring, at the time of his visit. I reminded Philip Wood to expect a phone call prior to his visit.  [name] 10/04/2018 3:45 PM   INSTRUCTIONS FOR DOWNLOADING THE MYCHART APP TO SMARTPHONE  - The patient must first make sure to have activated MyChart and know their login information - If Apple, go to [employer] and type in MyChart in the search bar and download the app. If Android, ask patient to go to [employer] and type in [place] in the search bar and download the app. The app is free but as with any other app downloads, their phone may require them to verify saved payment information or  Apple/Android password.  - The patient will need to then log into the app with their MyChart username and password, and select [facility] as their healthcare provider to link the account. When it is time for your visit, go to the MyChart app, find appointments, and click Begin Video Visit. Be sure to Select Allow for your device to access the Microphone and Camera for your visit. You will then be connected, and your provider will be with you shortly.  **If they have any issues connecting, or need assistance please contact MyChart service desk [phone])**  **If using a computer, in order to ensure the best quality for their visit they will need to use either of the following Internet Browsers: [employer], or Google Chrome**  IF USING DOXIMITY or DOXY.ME - The patient will receive a link just prior to their visit by text.     FULL LENGTH CONSENT FOR TELE-HEALTH VISIT   I hereby voluntarily request, consent and authorize [facility] and its employed or contracted physicians, physician assistants, nurse practitioners or other licensed health care professionals (the Practitioner), to provide me with telemedicine health care services (the "Services") as deemed necessary by the treating Practitioner. I acknowledge and consent to receive the Services by the Practitioner via telemedicine. I understand that the telemedicine visit will involve communicating with the Practitioner through live audiovisual communication technology and the disclosure of certain medical information by electronic transmission. I acknowledge that I have been given the opportunity to request an in-person assessment or other available alternative prior to the telemedicine visit and am voluntarily participating in the telemedicine visit.  I understand that I have the right to withhold or withdraw my consent to the use of telemedicine in the course of my care at any time, without affecting my right to future care  or treatment, and that the Practitioner or I may terminate the telemedicine visit at any time. I understand that I have the right to inspect all information obtained and/or recorded in the course of the telemedicine visit and may receive copies of available information for a reasonable fee.  I understand that some of the potential risks of receiving the Services via telemedicine include:  Marland Kitchen Delay or interruption in medical evaluation due to technological equipment failure or disruption; . Information transmitted may not be sufficient (e.g. poor resolution of images) to allow for appropriate medical decision making by the Practitioner; and/or  . In rare instances, security protocols could fail, causing a breach of personal health information.  Furthermore, I acknowledge that it is my responsibility to provide information about my medical history, conditions and care that is complete and accurate to the best of my ability. I acknowledge that Practitioner's advice, recommendations, and/or decision may be based on factors not within their control, such as incomplete or inaccurate data provided by me or distortions of diagnostic images or specimens that may result from electronic transmissions. I understand that  the practice of medicine is not an exact science and that Practitioner makes no warranties or guarantees regarding treatment outcomes. I acknowledge that I will receive a copy of this consent concurrently upon execution via email to the email address I last provided but may also request a printed copy by calling the office of [facility].    I understand that my insurance will be billed for this visit.   I have read or had this consent read to me. . I understand the contents of this consent, which adequately explains the benefits and risks of the Services being provided via telemedicine.  . I have been provided ample opportunity to ask questions regarding this consent and the Services and have had  my questions answered to my satisfaction. . I give my informed consent for the services to be provided through the use of telemedicine in my medical care  By participating in this telemedicine visit I agree to the above.

## 2018-10-20 ENCOUNTER — Encounter: Payer: Self-pay | Admitting: Physician Assistant

## 2018-10-20 NOTE — Progress Notes (Signed)
Virtual Visit via Telephone Note   This visit type was conducted due to national recommendations for restrictions regarding the COVID-19 Pandemic (e.g. social distancing) in an effort to limit this patient's exposure and mitigate transmission in our community.  Due to his co-morbid illnesses, this patient is at least at moderate risk for complications without adequate follow up.  This format is felt to be most appropriate for this patient at this time.  The patient did not have access to video technology/had technical difficulties with video requiring transitioning to audio format only (telephone).  All issues noted in this document were discussed and addressed.  No physical exam could be performed with this format.  Please refer to the patient's chart for his  consent to telehealth for [facility].   Date:  10/21/2018   ID:  Philip Wood, DOB 02-19-1946, MRN [id number]  Patient Location: Home Provider Location: Office  PCP:  Venita Lick, NP  Cardiologist:  Kathlyn Sacramento, MD  Electrophysiologist:  None   Evaluation Performed:  Follow-Up Visit  Chief Complaint:  Follow up  History of Present Illness:    Philip Wood is a 73 y.o. 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  Patient was seen in 08/2017 for preoperative cardiac evaluation prior to bronchoscopy.  He reported having atypical chest pain during that initial evaluation and underwent Lexiscan Myoview which showed a small severe defect in the apex most likely representing artifact without significant ischemia.  However, his EF  was moderately reduced at 30-45%.  He underwent echo which showed an EF of 00-93%, grade 2 diastolic dysfunction, mild to moderate tricuspid regurgitation, and mild to moderate pulmonary hypertension with a PASP of 44 mmHg.  He underwent carotid artery ultrasound for bruit noted on exam which showed bilateral 1 to 39% internal carotid artery stenoses with antegrade bilateral vertebral artery flow and normal flow hemodynamics in the bilateral subclavian arteries.  He subsequently underwent bronchoscopy with biopsy which was suspicious for cancer and was treated with radiation therapy.  He was most recently seen by Dr. Fletcher Anon on 01/01/2018 and was doing well without any recent chest pain or shortness of breath.  He had cut down his tobacco use to 3 to 4 cigarettes/day with a prior usage of 1 pack/day.  Most recent chest CT from 08/23/2018 showed decreased size of left lower lobe solid pulmonary nodule and small mural nodular density associated with cystic right upper lobe lesion without new or progressive disease within the thorax.  He is followed closely by hematology and ENT given his history of laryngeal cancer as well as presumed lung carcinoma.  He is seen in telehealth follow-up this morning and is doing well from a cardiac perspective.  No chest pain, shortness of breath, palpitations, dizziness, presyncope, or syncope.  No lower extremity swelling, abdominal distention, increased orthopnea, PND, or early satiety.  He has stable two-pillow orthopnea.  He is not eating foods high and salt.  He notes since he has finished radiation therapy his weight has trended up though remained stable ranging between the low 180s to mid 180s.  He does not have a blood pressure  cuff at home.  No falls, BRBPR, or melena.  He does not have any issues or concerns today.  Labs: 08/2018 - sodium 141 07/2018- serum creatinine 1.13, potassium 4.5, albumin 4.1, AST/LT normal, LDL 79, A1c 5.7 06/2017 -  Hgb 15.1  The patient does not  have symptoms concerning for COVID-19 infection (fever, chills, cough, or new shortness of breath).    Past Medical History:  Diagnosis Date  . Cancer ([place])    laryngeal  . COPD (chronic obstructive pulmonary disease) ([place])   . Diabetes mellitus without complication ([place])   . GERD (gastroesophageal reflux disease)   . Hyperlipidemia   . Hypertension   . Lumbago   . Neuropathy   . Tobacco abuse disorder    Past Surgical History:  Procedure Laterality Date  . CHOLECYSTECTOMY    . ELECTROMAGNETIC NAVIGATION BROCHOSCOPY N/A 08/19/2017   Procedure: ELECTROMAGNETIC NAVIGATION BRONCHOSCOPY;  Surgeon: Flora Lipps, MD;  Location: [facility] ORS;  Service: Cardiopulmonary;  Laterality: N/A;  . ELECTROMAGNETIC NAVIGATION BROCHOSCOPY Left 09/02/2017   Procedure: ELECTROMAGNETIC NAVIGATION BRONCHOSCOPY;  Surgeon: Flora Lipps, MD;  Location: [facility] ORS;  Service: Cardiopulmonary;  Laterality: Left;  . THROAT SURGERY       Current Meds  Medication Sig  . allopurinol (ZYLOPRIM) 100 MG tablet Take 1 tablet (100 mg total) by mouth daily.  Marland Kitchen aspirin 81 MG tablet Take 81 mg by mouth daily.  Marland Kitchen atorvastatin (LIPITOR) 40 MG tablet Take 1 tablet (40 mg total) by mouth daily.  . metFORMIN (GLUCOPHAGE) 500 MG tablet Take 1 tablet (500 mg total) by mouth 2 (two) times daily with a meal.  . metoprolol succinate (TOPROL XL) 25 MG 24 hr tablet Take 1 tablet (25 mg total) by mouth daily.  . ramipril (ALTACE) 10 MG capsule Take 1 capsule (10 mg total) by mouth daily.  Marland Kitchen tiotropium (SPIRIVA HANDIHALER) 18 MCG inhalation capsule Place 1 capsule (18 mcg total) into inhaler and inhale daily.     Allergies:   Patient has no known allergies.   Social History   Tobacco Use  . Smoking status: Light Tobacco Smoker    Packs/day: 0.60    Years: 53.00    Pack years: 31.80    Types: Cigarettes  . Smokeless tobacco: Never Used  . Tobacco comment: Approx 6 cigs a day   Substance Use Topics  . Alcohol use: No     Alcohol/week: 0.0 standard drinks  . Drug use: No     Family Hx: The patient's family history includes Brain cancer in his sister; Cervical cancer in his sister; Diabetes in his sister; Heart attack in his father; Heart disease in his father; Lung cancer in his brother, brother, brother, and brother; Lupus in his sister; Rectal cancer in his brother; Stomach cancer in his sister and sister.  ROS:   Please see the history of present illness.     All other systems reviewed and are negative.   Prior CV studies:   The following studies were reviewed today:  2D Echo 08/2017: - Left ventricle: The cavity size was normal. Systolic function was   mildly reduced. The estimated ejection fraction was in the range   of 45% to 50%. Diffuse hypokinesis. Regional wall motion   abnormalities cannot be excluded. Features are consistent with a   pseudonormal left ventricular filling pattern, with concomitant   abnormal relaxation and increased filling pressure (grade 2   diastolic dysfunction). - Mitral valve: There was mild regurgitation. - Right ventricle: Systolic function was  normal. - Tricuspid valve: There was mild-moderate regurgitation. - Pulmonary arteries: Systolic pressure was mildly elevated. PA   peak pressure: 44 mm Hg (S). _________  Myoview 08/2017:  Abnormal pharmacologic myocardial perfusion stress test.  There is a small in size, severe, fixed defect at the apex that most likely represents artifact (attenuation and/or apical thinning) and less likely scar.  No significant ischemia is observed.  The left ventricular ejection fraction is moderately decreased (30-44%) with diffuse hypokinesis.  This is an intermediate risk study due to moderately reduced LVEF - echo correlation may be helpful.  The sensitivity and specificity of the study are degraded by significant extracardiac activity and attenuation artifact.  Labs/Other Tests and Data Reviewed:    EKG:  No ECG  reviewed.  Recent Labs: 08/12/2018: ALT 8; BUN 14; Creatinine, Ser 1.13; Potassium 4.5 08/31/2018: Sodium 141   Recent Lipid Panel Lab Results  Component Value Date/Time   CHOL 149 08/12/2018 08:38 AM   TRIG 111 08/12/2018 08:38 AM   HDL 38 (L) 02/12/2018 10:08 AM   LDLCALC 89 02/12/2018 10:08 AM    Wt Readings from Last 3 Encounters:  10/21/18 185 lb (83.9 kg)  09/20/18 180 lb (81.6 kg)  09/01/18 180 lb 12.4 oz (82 kg)     Objective:    Vital Signs:  Ht 5\' 9"  (1.753 m)   Wt 185 lb (83.9 kg)   BMI 27.32 kg/m    VITAL SIGNS:  reviewed  ASSESSMENT & PLAN:    1. HFrEF secondary to nonischemic cardiomyopathy: He denies any symptoms concerning for volume overload or decompensation.  Most recent EF of 45 to 50% as outlined above.  He is not on a standing diuretic.  Continue current therapy with Toprol-XL and ramipril.  CHF education was discussed.  2. Hyperlipidemia: Increase Lipitor to 80 mg daily.  Most recent LDL of 79 with a goal LDL less than 70.  Recommend rechecking fasting lipid panel and liver function in follow-up.  3. Hypertension: No recent blood pressure readings.  Continue current therapy.  4. Peripheral arterial disease with bilateral leg claudication: Followed by vein and vascular surgery.  Stable.  Continue aspirin and Lipitor.  COVID-19 Education: The signs and symptoms of COVID-19 were discussed with the patient and how to seek care for testing (follow up with PCP or arrange E-visit).  The importance of social distancing was discussed today.  Time:   Today, I have spent 8 minutes with the patient with telehealth technology discussing the above problems.     Medication Adjustments/Labs and Tests Ordered: Current medicines are reviewed at length with the patient today.  Concerns regarding medicines are outlined above.   Tests Ordered: No orders of the defined types were placed in this encounter.   Medication Changes: No orders of the defined types were  placed in this encounter.   Follow Up:  In Person in 6 month(s)  Signed, Christell Faith, PA-C  10/21/2018 11:24 AM    [place]

## 2018-10-21 ENCOUNTER — Other Ambulatory Visit: Payer: Self-pay

## 2018-10-21 ENCOUNTER — Encounter: Payer: Self-pay | Admitting: Physician Assistant

## 2018-10-21 ENCOUNTER — Telehealth (INDEPENDENT_AMBULATORY_CARE_PROVIDER_SITE_OTHER): Payer: Medicare Other | Admitting: Physician Assistant

## 2018-10-21 VITALS — Ht 69.0 in | Wt 185.0 lb

## 2018-10-21 DIAGNOSIS — I739 Peripheral vascular disease, unspecified: Secondary | ICD-10-CM

## 2018-10-21 DIAGNOSIS — I5022 Chronic systolic (congestive) heart failure: Secondary | ICD-10-CM

## 2018-10-21 DIAGNOSIS — I428 Other cardiomyopathies: Secondary | ICD-10-CM | POA: Diagnosis not present

## 2018-10-21 DIAGNOSIS — I10 Essential (primary) hypertension: Secondary | ICD-10-CM

## 2018-10-21 DIAGNOSIS — E785 Hyperlipidemia, unspecified: Secondary | ICD-10-CM | POA: Diagnosis not present

## 2018-10-21 MED ORDER — ATORVASTATIN CALCIUM 80 MG PO TABS: 80.0000 mg | ORAL_TABLET | Freq: Every day | ORAL | 3 refills | Status: DC

## 2018-10-21 NOTE — Patient Instructions (Signed)
Medication Instructions:  Your physician has recommended you make the following change in your medication:  1. INCREASE Atorvastatin (Lipitor) to 80 mg once daily  If you need a refill on your cardiac medications before your next appointment, please call your pharmacy.   Lab work: None If you have labs (blood work) drawn today and your tests are completely normal, you will receive your results only by: Marland Kitchen MyChart Message (if you have MyChart) OR . A paper copy in the mail If you have any lab test that is abnormal or we need to change your treatment, we will call you to review the results.  Testing/Procedures: None  Follow-Up: At [facility], you and your health needs are our priority.  As part of our continuing mission to provide you with exceptional heart care, we have created designated Provider Care Teams.  These Care Teams include your primary Cardiologist (physician) and Advanced Practice Providers (APPs -  Physician Assistants and Nurse Practitioners) who all work together to provide you with the care you need, when you need it. You will need a follow up appointment in 6 months.  Please call our office 2 months in advance to schedule this appointment.  You may see Kathlyn Sacramento, MD in person or one of the following Advanced Practice Providers on your designated Care Team:   Murray Hodgkins, NP Christell Faith, PA-C . Marrianne Mood, PA-C  Any Other Special Instructions Will Be Listed Below (If Applicable).

## 2018-11-13 ENCOUNTER — Encounter: Payer: Self-pay | Admitting: Nurse Practitioner

## 2018-11-16 ENCOUNTER — Other Ambulatory Visit: Payer: Self-pay

## 2018-11-16 ENCOUNTER — Ambulatory Visit (INDEPENDENT_AMBULATORY_CARE_PROVIDER_SITE_OTHER): Payer: Medicare Other | Admitting: Nurse Practitioner

## 2018-11-16 ENCOUNTER — Encounter: Payer: Self-pay | Admitting: Nurse Practitioner

## 2018-11-16 VITALS — BP 130/82 | HR 69 | Temp 98.2°F

## 2018-11-16 DIAGNOSIS — E0822 Diabetes mellitus due to underlying condition with diabetic chronic kidney disease: Secondary | ICD-10-CM

## 2018-11-16 DIAGNOSIS — I130 Hypertensive heart and chronic kidney disease with heart failure and stage 1 through stage 4 chronic kidney disease, or unspecified chronic kidney disease: Secondary | ICD-10-CM | POA: Diagnosis not present

## 2018-11-16 DIAGNOSIS — E1169 Type 2 diabetes mellitus with other specified complication: Secondary | ICD-10-CM | POA: Diagnosis not present

## 2018-11-16 DIAGNOSIS — F17219 Nicotine dependence, cigarettes, with unspecified nicotine-induced disorders: Secondary | ICD-10-CM | POA: Diagnosis not present

## 2018-11-16 DIAGNOSIS — N183 Chronic kidney disease, stage 3 (moderate): Secondary | ICD-10-CM

## 2018-11-16 DIAGNOSIS — E785 Hyperlipidemia, unspecified: Secondary | ICD-10-CM

## 2018-11-16 NOTE — Assessment & Plan Note (Signed)
Chronic, ongoing.  Continue current medication regimen and adjust as needed.  Lipid panel next visit.

## 2018-11-16 NOTE — Assessment & Plan Note (Signed)
Chronic, ongoing with BP at goal on repeat today.  Continue current medication regimen and adjust as needed + continue collaboration with cardiology team.  Recommend monitoring BP at home at least 3 mornings a week and documenting for providers.  BMP today.

## 2018-11-16 NOTE — Assessment & Plan Note (Signed)
I have recommended complete cessation of tobacco use. I have discussed various options available for assistance with tobacco cessation including over the counter methods (Nicotine gum, patch and lozenges). We also discussed prescription options (Chantix, Nicotine Inhaler / Nasal Spray). The patient is not interested in pursuing any prescription tobacco cessation options at this time.  

## 2018-11-16 NOTE — Patient Instructions (Signed)

## 2018-11-16 NOTE — Progress Notes (Signed)
BP 130/82 (BP Location: Left Arm, Patient Position: Sitting)   Pulse 69   Temp 98.2 F (36.8 C) (Oral)   SpO2 98%    Subjective:    Patient ID: Philip Wood, male    DOB: 05-14-1945, 73 y.o.   MRN: [id number]  HPI: RAQUON MILLEDGE is a 73 y.o. male  Chief Complaint  Patient presents with  . Diabetes    pt states he has not had eye exam yet this year   . Hyperlipidemia  . Hypertension    DIABETES Recent A1C 5.7%.  Continues on Metformin. Hypoglycemic episodes:no Polydipsia/polyuria: no Visual disturbance: no Chest pain: no Paresthesias: no Glucose Monitoring: no  Accucheck frequency: Not Checking  Fasting glucose:  Post prandial:  Evening:  Before meals: Taking Insulin?: no  Long acting insulin:  Short acting insulin: Blood Pressure Monitoring: not checking Retinal Examination: Not up to Date Foot Exam: Up to Date Pneumovax: Up to Date Influenza: Up to Date Aspirin: yes   HYPERTENSION / HYPERLIPIDEMIA  Continues on Metoprolol, Ramiprill, and Atorvastatin.  He reports he did not take his medication yesterday, forgot it.  He continues to smoke 1/2 PPD and is not interested in quitting.  He endorses he knows "I am not supposed to be smoking".  Sees cardiology and last seen 10/26/2018 via telemedicine where they increased his Atorvastatin to 80 MG. Satisfied with current treatment? yes Duration of hypertension: chronic BP monitoring frequency: not checking BP range:  BP medication side effects: no Duration of hyperlipidemia: chronic Cholesterol medication side effects: no Cholesterol supplements: none Medication compliance: good compliance Aspirin: yes Recent stressors: no Recurrent headaches: no Visual changes: no Palpitations: no Dyspnea: no Chest pain: no Lower extremity edema: no Dizzy/lightheaded: no   [facility] SCREENING: States his kit is on way through the male, they called him a week ago to inform.  Relevant past medical, surgical,  family and social history reviewed and updated as indicated. Interim medical history since our last visit reviewed. Allergies and medications reviewed and updated.  Review of Systems  Constitutional: Negative for activity change, diaphoresis, fatigue and fever.  Respiratory: Negative for cough, chest tightness, shortness of breath and wheezing.   Cardiovascular: Negative for chest pain, palpitations and leg swelling.  Gastrointestinal: Negative for abdominal distention, abdominal pain, constipation, diarrhea, nausea and vomiting.  Endocrine: Negative for cold intolerance, heat intolerance, polydipsia, polyphagia and polyuria.  Musculoskeletal: Negative.   Skin: Negative.   Neurological: Negative for dizziness, syncope, weakness, light-headedness, numbness and headaches.  Psychiatric/Behavioral: Negative.     Per HPI unless specifically indicated above     Objective:    BP 130/82 (BP Location: Left Arm, Patient Position: Sitting)   Pulse 69   Temp 98.2 F (36.8 C) (Oral)   SpO2 98%   Wt Readings from Last 3 Encounters:  10/21/18 185 lb (83.9 kg)  09/20/18 180 lb (81.6 kg)  09/01/18 180 lb 12.4 oz (82 kg)    Physical Exam Vitals signs and nursing note reviewed.  Constitutional:      Appearance: He is well-developed.  HENT:     Head: Normocephalic and atraumatic.     Right Ear: Hearing normal. No drainage.     Left Ear: Hearing normal. No drainage.     Mouth/Throat:     Pharynx: Uvula midline.  Eyes:     General: Lids are normal.        Right eye: No discharge.        Left eye: No discharge.  Conjunctiva/sclera: Conjunctivae normal.     Pupils: Pupils are equal, round, and reactive to light.  Neck:     Musculoskeletal: Normal range of motion and neck supple.     Trachea: Trachea normal.  Cardiovascular:     Rate and Rhythm: Normal rate and regular rhythm.     Heart sounds: Normal heart sounds, S1 normal and S2 normal. No murmur. No gallop.   Pulmonary:     Effort:  Pulmonary effort is normal.     Breath sounds: Normal breath sounds.  Abdominal:     General: Bowel sounds are normal.     Palpations: Abdomen is soft. There is no hepatomegaly or splenomegaly.  Musculoskeletal: Normal range of motion.     Right lower leg: No edema.     Left lower leg: No edema.  Skin:    General: Skin is warm and dry.     Capillary Refill: Capillary refill takes less than 2 seconds.  Neurological:     Mental Status: He is alert and oriented to person, place, and time.     Deep Tendon Reflexes: Reflexes are normal and symmetric.  Psychiatric:        Mood and Affect: Mood normal.        Behavior: Behavior normal.        Thought Content: Thought content normal.        Judgment: Judgment normal.     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  Continue current medication regimen and adjust as needed + continue collaboration with cardiology team.  Recommend monitoring BP at home at least 3 mornings a week and documenting for providers.  BMP today.         Relevant Orders   Basic Metabolic Panel (BMET)     Endocrine   Diabetes mellitus with chronic kidney disease ([place]) - Primary    Chronic, ongoing.  Continue current medication regimen and adjust as needed.  A1C today.         Relevant Orders   HgB A1c   Hyperlipidemia associated with type 2 diabetes mellitus (HCC)    Chronic, ongoing.  Continue current medication regimen and adjust as needed.  Lipid panel next visit.           Nervous and Auditory   Nicotine dependence, cigarettes, w unsp disorders    I have recommended complete cessation of tobacco use. I have discussed various options  available for assistance with tobacco cessation including over the counter methods (Nicotine gum, patch and lozenges). We also discussed prescription options (Chantix, Nicotine Inhaler / Nasal Spray). The patient is not interested in pursuing any prescription tobacco cessation options at this time.           Follow up plan: Return in about 6 months (around 05/19/2019) for T2DM, HTN/HLD.

## 2018-11-16 NOTE — Assessment & Plan Note (Signed)
Chronic, ongoing.  Continue current medication regimen and adjust as needed.  A1C today.

## 2018-11-17 LAB — BASIC METABOLIC PANEL WITH GFR
BUN/Creatinine Ratio: 13 (ref 10–24)
BUN: 15 mg/dL (ref 8–27)
CO2: 25 mmol/L (ref 20–29)
Calcium: 9.2 mg/dL (ref 8.6–10.2)
Chloride: 100 mmol/L (ref 96–106)
Creatinine, Ser: 1.12 mg/dL (ref 0.76–1.27)
GFR calc Af Amer: 75 mL/min/{1.73_m2}
GFR calc non Af Amer: 65 mL/min/{1.73_m2}
Glucose: 105 mg/dL — ABNORMAL HIGH (ref 65–99)
Potassium: 4.9 mmol/L (ref 3.5–5.2)
Sodium: 140 mmol/L (ref 134–144)

## 2018-11-17 LAB — HEMOGLOBIN A1C
Est. average glucose Bld gHb Est-mCnc: 123 mg/dL
Hgb A1c MFr Bld: 5.9 % — ABNORMAL HIGH (ref 4.8–5.6)

## 2018-11-24 ENCOUNTER — Other Ambulatory Visit: Payer: Self-pay | Admitting: Nurse Practitioner

## 2018-11-24 DIAGNOSIS — Z1211 Encounter for screening for malignant neoplasm of colon: Secondary | ICD-10-CM

## 2018-12-01 ENCOUNTER — Encounter: Payer: Self-pay | Admitting: Emergency Medicine

## 2018-12-01 ENCOUNTER — Ambulatory Visit
Admission: EM | Admit: 2018-12-01 | Discharge: 2018-12-01 | Disposition: A | Discharge status: Home / Self Care | Payer: Medicare Other | Attending: Emergency Medicine | Admitting: Emergency Medicine

## 2018-12-01 ENCOUNTER — Other Ambulatory Visit: Payer: Self-pay

## 2018-12-01 DIAGNOSIS — S30861A Insect bite (nonvenomous) of abdominal wall, initial encounter: Secondary | ICD-10-CM | POA: Diagnosis not present

## 2018-12-01 DIAGNOSIS — W57XXXA Bitten or stung by nonvenomous insect and other nonvenomous arthropods, initial encounter: Secondary | ICD-10-CM | POA: Diagnosis not present

## 2018-12-01 DIAGNOSIS — L0390 Cellulitis, unspecified: Secondary | ICD-10-CM

## 2018-12-01 MED ORDER — MUPIROCIN 2 % EX OINT: TOPICAL_OINTMENT | CUTANEOUS | 0 refills | Status: DC

## 2018-12-01 MED ORDER — DOXYCYCLINE HYCLATE 100 MG PO CAPS: 100.0000 mg | ORAL_CAPSULE | Freq: Two times a day (BID) | ORAL | 0 refills | Status: AC

## 2018-12-01 NOTE — ED Provider Notes (Signed)
[facility] ____________________________________________  Time seen: Approximately 9:47 AM  I have reviewed the triage vital signs and the nursing notes.   HISTORY  Chief Complaint Tick Removal   HPI Philip Wood is a 73 y.o. male presenting for evaluation of tick attachment and tick removal.  Patient reports he has had a tick to his lower abdomen present for the last 2 weeks approximately.  Patient states he initially thought that this was a mole.  States it has been very itchy over the last few days and then he subsequently realized that it was not a mole.  Denies any pain or drainage.  No chest pain, shortness of breath, fevers, rash or changes in any joint pain.  No alleviating measures other than trying to remove it himself and states he was unable to.  Denies aggravating factors.  Tetanus immunization up-to-date.  Reports otherwise doing well.  Venita Lick, NP: PCP   Past Medical History:  Diagnosis Date  . Cancer ([place])    laryngeal  . COPD (chronic obstructive pulmonary disease) ([place])   . Diabetes mellitus without complication ([place])   . GERD (gastroesophageal reflux disease)   . Hyperlipidemia   . Hypertension   . Lumbago   . Neuropathy   . Tobacco abuse disorder     Patient Active Problem List   Diagnosis Date Noted  . Chronic systolic heart failure ([place]) 08/13/2018  . Lung cancer ([place]) 08/14/2017  . Atherosclerosis of native arteries of extremity with intermittent claudication ([place]) 08/11/2016  . PAD (peripheral artery disease) ([place]) 07/15/2016  . Advanced care planning/counseling discussion 07/11/2016  . 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 Diabetes mellitus with chronic kidney disease ([place]) 12/21/2014  . Hyperlipidemia associated with type 2 diabetes mellitus ([place]) 12/21/2014  . COPD (chronic obstructive pulmonary disease) ([place]) 12/21/2014  . 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 CKD  (chronic kidney disease), stage III ([place]) 12/21/2014  . Nicotine dependence, cigarettes, w unsp disorders 12/21/2014  . Lumbago 12/21/2014  . Gout 12/21/2014    Past Surgical History:  Procedure Laterality Date  . CHOLECYSTECTOMY    . ELECTROMAGNETIC NAVIGATION BROCHOSCOPY N/A 08/19/2017   Procedure: ELECTROMAGNETIC NAVIGATION BRONCHOSCOPY;  Surgeon: Flora Lipps, MD;  Location: [facility] ORS;  Service: Cardiopulmonary;  Laterality: N/A;  . ELECTROMAGNETIC NAVIGATION BROCHOSCOPY Left 09/02/2017   Procedure: ELECTROMAGNETIC NAVIGATION BRONCHOSCOPY;  Surgeon: Flora Lipps, MD;  Location: [facility] ORS;  Service: Cardiopulmonary;  Laterality: Left;  . THROAT SURGERY       No current facility-administered medications for this encounter.   Current Outpatient Medications:  .  allopurinol (ZYLOPRIM) 100 MG tablet, Take 1 tablet (100 mg total) by mouth daily., Disp: 90 tablet, Rfl: 2 .  aspirin 81 MG tablet, Take 81 mg by mouth daily., Disp: , Rfl:  .  atorvastatin (LIPITOR) 80 MG tablet, Take 1 tablet (80 mg total) by mouth daily at 6 PM., Disp: 90 tablet, Rfl: 3 .  metFORMIN (GLUCOPHAGE) 500 MG tablet, Take 1 tablet (500 mg total) by mouth 2 (two) times daily with a meal., Disp: 180 tablet, Rfl: 1 .  metoprolol succinate (TOPROL XL) 25 MG 24 hr tablet, Take 1 tablet (25 mg total) by mouth daily., Disp: 90 tablet, Rfl: 3 .  ramipril (ALTACE) 10 MG capsule, Take 1 capsule (10 mg total) by mouth daily., Disp: 90 capsule, Rfl: 3 .  tiotropium (SPIRIVA HANDIHALER) 18 MCG inhalation capsule, Place 1 capsule (18 mcg total) into  inhaler and inhale daily., Disp: 90 capsule, Rfl: 1 .  doxycycline (VIBRAMYCIN) 100 MG capsule, Take 1 capsule (100 mg total) by mouth 2 (two) times daily for 7 days., Disp: 14 capsule, Rfl: 0 .  mupirocin ointment (BACTROBAN) 2 %, Apply two times a day for 7 days., Disp: 22 g, Rfl: 0  Allergies Patient has no known allergies.  Family History  Problem Relation Age of Onset  . Heart  disease Father   . Heart attack Father   . Brain cancer Sister   . Cervical cancer Sister   . Rectal cancer Brother   . Lung cancer Brother   . Lung cancer Brother   . Lung cancer Brother   . Stomach cancer Sister   . Lung cancer Brother   . Stomach cancer Sister   . Diabetes Sister   . Lupus Sister     Social History Social History   Tobacco Use  . Smoking status: Light Tobacco Smoker    Packs/day: 0.60    Years: 53.00    Pack years: 31.80    Types: Cigarettes  . Smokeless tobacco: Never Used  . Tobacco comment: Aprrox 3-4 cigs/day  Substance Use Topics  . Alcohol use: No    Alcohol/week: 0.0 standard drinks  . Drug use: No    Review of Systems Constitutional: No fever ENT: No sore throat. Cardiovascular: Denies chest pain. Respiratory: Denies shortness of breath. Gastrointestinal: No abdominal pain.  Musculoskeletal: Negative for back pain. Skin: As above  ____________________________________________   PHYSICAL EXAM:  VITAL SIGNS: ED Triage Vitals  Enc Vitals Group     BP 12/01/18 0905 (!) 159/87     Pulse Rate 12/01/18 0905 60     Resp 12/01/18 0905 18     Temp 12/01/18 0905 97.7 F (36.5 C)     Temp Source 12/01/18 0905 Oral     SpO2 12/01/18 0905 97 %     Weight 12/01/18 0906 180 lb (81.6 kg)     Height 12/01/18 0906 5\' 9"  (1.753 m)     Head Circumference --      Peak Flow --      Pain Score 12/01/18 0905 0     Pain Loc --      Pain Edu? --      Excl. in [place]? --     Constitutional: Alert and oriented. Well appearing and in no acute distress. Eyes: Conjunctivae are normal. ENT      Head: Normocephalic and atraumatic. Cardiovascular: Normal rate, regular rhythm. Grossly normal heart sounds.  Good peripheral circulation. Respiratory: Normal respiratory effort without tachypnea nor retractions. Breath sounds are clear and equal bilaterally. No wheezes, rales, rhonchi. Gastrointestinal: Soft and nontender.  Musculoskeletal: Steady gait.  Neurologic:  Normal speech and language. Speech is normal. No gait instability.  Skin:  Skin is warm, dry.  Except: Lower abdomen near pant line engorged attached dog tick, dog tick removed with forceps, approximately 1 x 2 cm area of erythema with mild induration, no fluctuance, no active drainage, nontender. Psychiatric: Mood and affect are normal. Speech and behavior are normal. Patient exhibits appropriate insight and judgment   ___________________________________________   LABS (all labs ordered are listed, but only abnormal results are displayed)  Labs Reviewed - No data to display ____________________________________________  PROCEDURES Procedures   Tick removal Procedure explained and verbal consent obtained.  Risks and benefits discussed. Sterile forceps utilized to grasp tick and remove.  Tick appears to be removed intact, no retained foreign  body noted. Skin cleaned with Betadine.  Patient tolerated well.  INITIAL IMPRESSION / ASSESSMENT AND PLAN / ED COURSE  Pertinent labs & imaging results that were available during my care of the patient were reviewed by me and considered in my medical decision making (see chart for details).  Well-appearing patient.  No acute distress.  Tick attached.  Dog tick.  Removed.  Patient tolerated well.  Area of secondary cellulitis.  Will treat with doxycycline.  Topical Bactroban.  Cleaning and monitoring discussed.Discussed indication, risks and benefits of medications with patient.  Discussed follow up with Primary care physician this week. Discussed follow up and return parameters including no resolution or any worsening concerns. Patient verbalized understanding and agreed to plan.   ____________________________________________   FINAL CLINICAL IMPRESSION(S) / ED DIAGNOSES  Final diagnoses:  Tick bite of abdomen, initial encounter  Cellulitis, unspecified cellulitis site     ED Discharge Orders         Ordered    doxycycline  (VIBRAMYCIN) 100 MG capsule  2 times daily     12/01/18 0949    mupirocin ointment (BACTROBAN) 2 %     12/01/18 0949           Note: This dictation was prepared with Dragon dictation along with smaller phrase technology. Any transcriptional errors that result from this process are unintentional.         Marylene Land, NP 12/01/18 1132

## 2018-12-01 NOTE — ED Triage Notes (Signed)
Patient states he has a tick embedded in his lower abdomen.

## 2018-12-01 NOTE — Discharge Instructions (Addendum)
Take medication as prescribed. Keep clean. Monitor. Avoid scratching.   Follow up with your primary care physician this week as needed. Return to Urgent care for new or worsening concerns.

## 2018-12-06 DIAGNOSIS — Z1211 Encounter for screening for malignant neoplasm of colon: Secondary | ICD-10-CM | POA: Diagnosis not present

## 2018-12-08 LAB — FECAL OCCULT BLOOD, GUAIAC: Fecal Occult Blood: NEGATIVE

## 2019-01-04 ENCOUNTER — Other Ambulatory Visit: Payer: Self-pay

## 2019-01-04 MED ORDER — SPIRIVA HANDIHALER 18 MCG IN CAPS: 18.0000 ug | ORAL_CAPSULE | Freq: Every day | RESPIRATORY_TRACT | 1 refills | Status: DC

## 2019-01-04 MED ORDER — RAMIPRIL 10 MG PO CAPS: 10.0000 mg | ORAL_CAPSULE | Freq: Every day | ORAL | 3 refills | Status: DC

## 2019-01-04 MED ORDER — ATORVASTATIN CALCIUM 80 MG PO TABS: 80.0000 mg | ORAL_TABLET | Freq: Every day | ORAL | 3 refills | Status: DC

## 2019-01-04 MED ORDER — METOPROLOL SUCCINATE ER 25 MG PO TB24: 25.0000 mg | ORAL_TABLET | Freq: Every day | ORAL | 3 refills | Status: DC

## 2019-01-04 MED ORDER — ALLOPURINOL 100 MG PO TABS: 100.0000 mg | ORAL_TABLET | Freq: Every day | ORAL | 2 refills | Status: DC

## 2019-01-04 MED ORDER — METFORMIN HCL 500 MG PO TABS: 500.0000 mg | ORAL_TABLET | Freq: Two times a day (BID) | ORAL | 1 refills | Status: DC

## 2019-01-04 NOTE — Telephone Encounter (Signed)
Last OV 11/16/2018.  Follow-up scheduled in January

## 2019-01-06 ENCOUNTER — Telehealth: Payer: Self-pay | Admitting: Nurse Practitioner

## 2019-01-06 NOTE — Chronic Care Management (AMB) (Signed)
°  Chronic Care Management   Outreach Note  01/06/2019 Name: Philip Wood MRN: [id number] DOB: 05-Jan-1946  Referred by: Venita Lick, NP Reason for referral : Chronic Care Management (Initial CCM outreach was unsuccessful. )   An unsuccessful telephone outreach was attempted today. The patient was referred to the case management team by for assistance with chronic care management and care coordination.   Follow Up Plan: A HIPPA compliant phone message was left for the patient providing contact information and requesting a return call.  The care management team will reach out to the patient again over the next 7 days.  If patient returns call to provider office, please advise to call [place] at [place]  [email or web address]   ??[phone]

## 2019-01-11 NOTE — Chronic Care Management (AMB) (Signed)
Chronic Care Management   Note  01/11/2019 Name: Philip Wood MRN: [id number] DOB: 1946/03/04  Philip Wood is a 73 y.o. year old male who is a primary care patient of Cannady, [name], NP. I reached out to [place] by phone today in response to a referral sent by Philip Wood's health plan.    Mr. Vecchione was given information about Chronic Care Management services today including:  1. CCM service includes personalized support from designated clinical staff supervised by his physician, including individualized plan of care and coordination with other care providers 2. 24/7 contact phone numbers for assistance for urgent and routine care needs. 3. Service will only be billed when office clinical staff spend 20 minutes or more in a month to coordinate care. 4. Only one practitioner may furnish and bill the service in a calendar month. 5. The patient may stop CCM services at any time (effective at the end of the month) by phone call to the office staff. 6. The patient will be responsible for cost sharing (co-pay) of up to 20% of the service fee (after annual deductible is met).  Patient did not agree to enrollment in care management services and does not wish to consider at this time.  Follow up plan: The patient has been provided with contact information for the chronic care management team and has been advised to call with any health related questions or concerns.   [place]  [email or web address]   ??[phone]

## 2019-02-25 ENCOUNTER — Other Ambulatory Visit: Payer: Self-pay

## 2019-02-25 ENCOUNTER — Ambulatory Visit
Admission: RE | Admit: 2019-02-25 | Discharge: 2019-02-25 | Disposition: A | Discharge status: Home / Self Care | Payer: Medicare Other | Source: Ambulatory Visit | Attending: Oncology | Admitting: Oncology

## 2019-02-25 DIAGNOSIS — C3490 Malignant neoplasm of unspecified part of unspecified bronchus or lung: Secondary | ICD-10-CM | POA: Insufficient documentation

## 2019-02-25 DIAGNOSIS — Z720 Tobacco use: Secondary | ICD-10-CM

## 2019-02-25 IMAGING — CT CT CHEST W/O CM
2 of 4 series · 15 of 36 positions shown, 18 images · non-contrast
Comparison: [DATE]

CLINICAL DATA: Restaging non-small cell lung cancer. Status post
radiation therapy.

EXAM:
CT CHEST WITHOUT CONTRAST
TECHNIQUE: Multidetector CT imaging of the chest was performed following the
standard protocol without IV contrast.

[Series 2: chest 2.00 · axial · 0.70mm/px · z∈[-1189,-887]mm · 12 of 179 slices shown, 15 images]
[im 14/179  mediastinal]
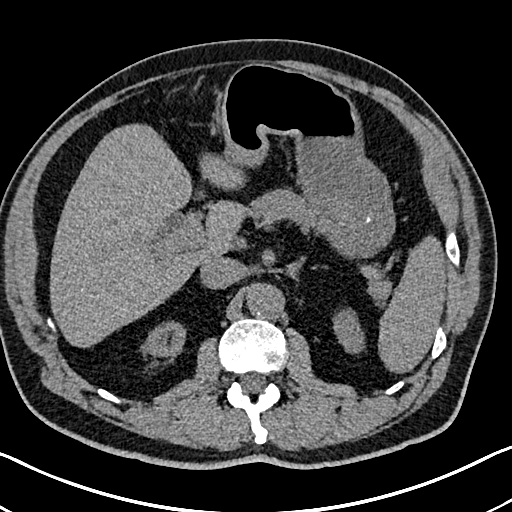
[im 14/179  lung]
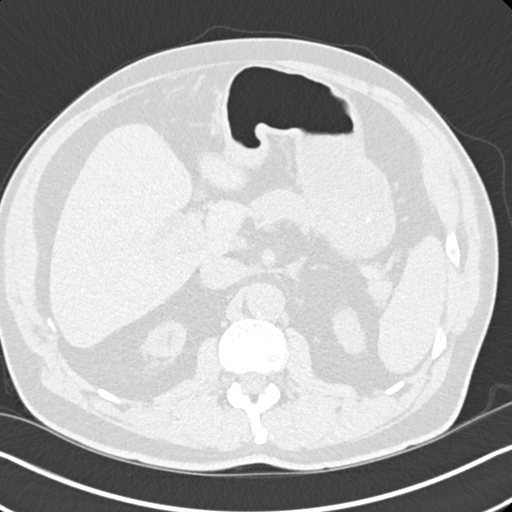
[im 28/179  lung]
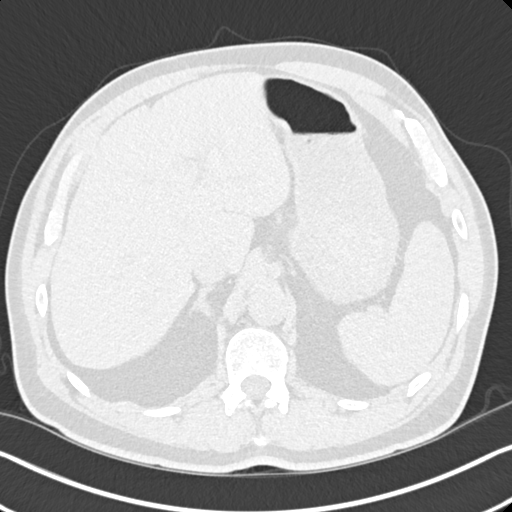
[im 42/179  lung]
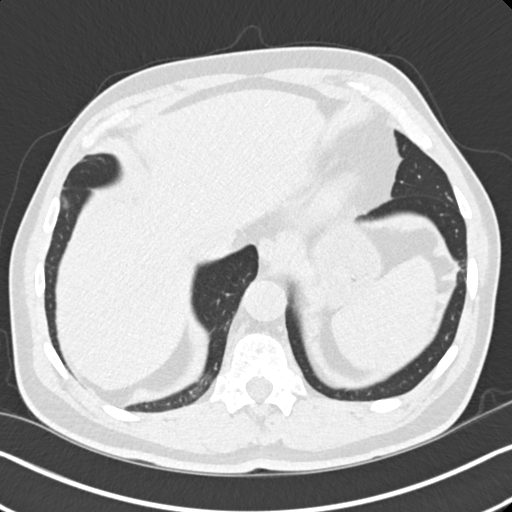
[im 55/179  lung]
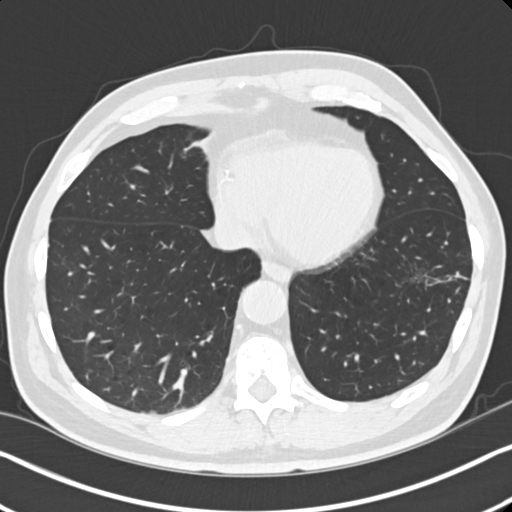
[im 69/179  mediastinal]
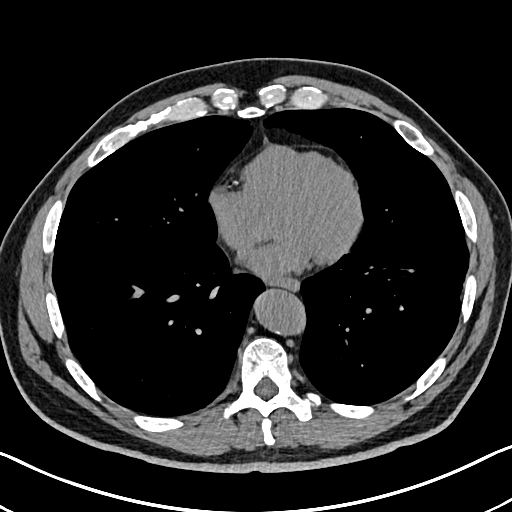
[im 69/179  lung]
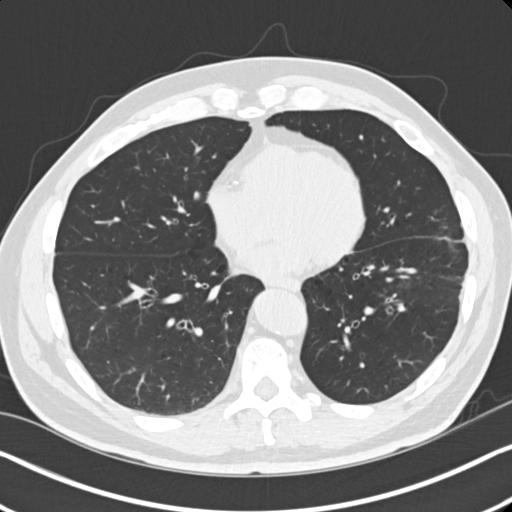
[im 83/179  lung]
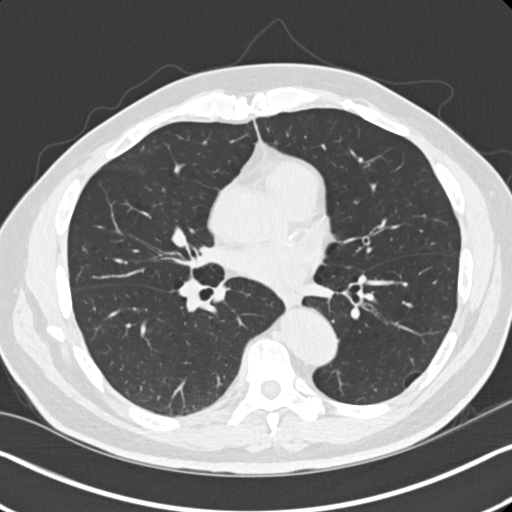
[im 96/179  lung]
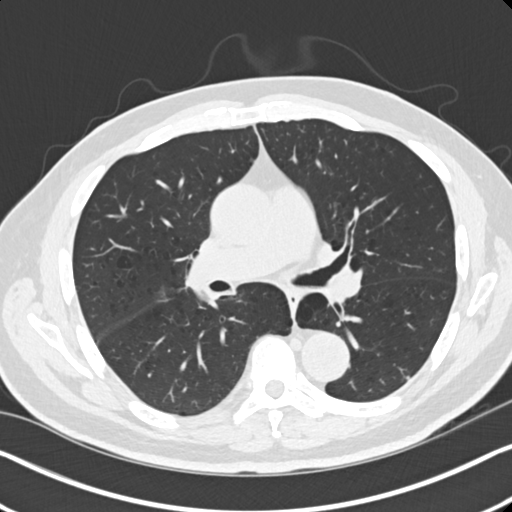
[im 110/179  lung]
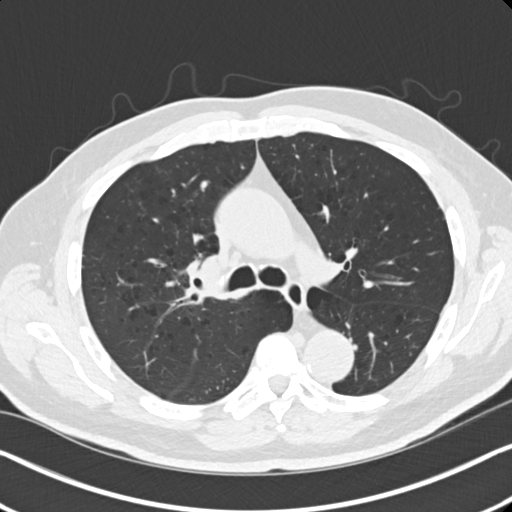
[im 124/179  mediastinal]
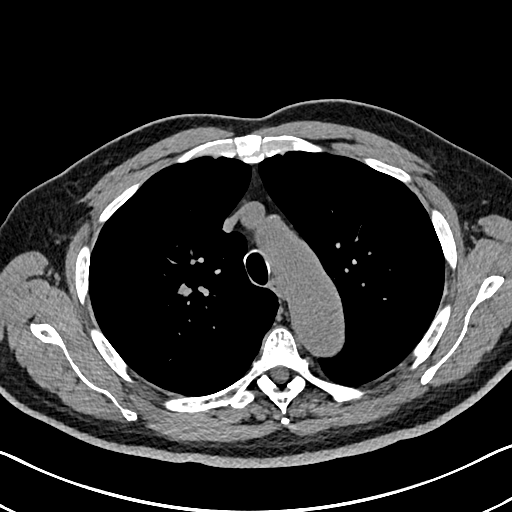
[im 124/179  lung]
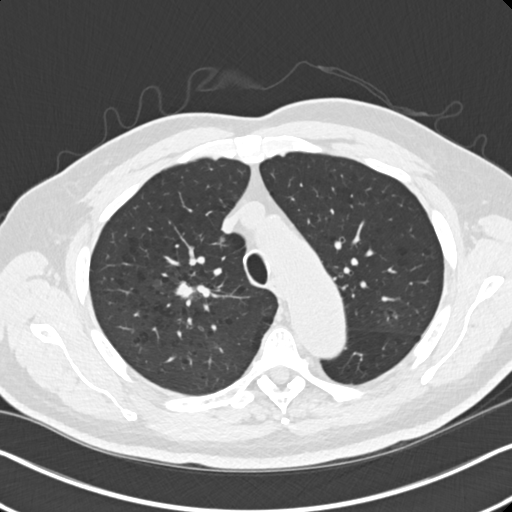
[im 137/179  lung]
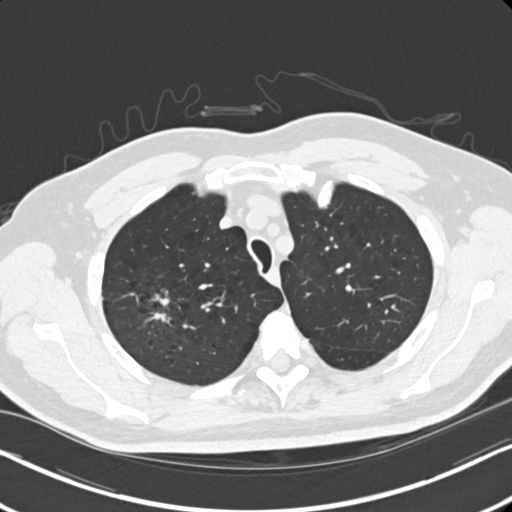
[im 151/179  lung]
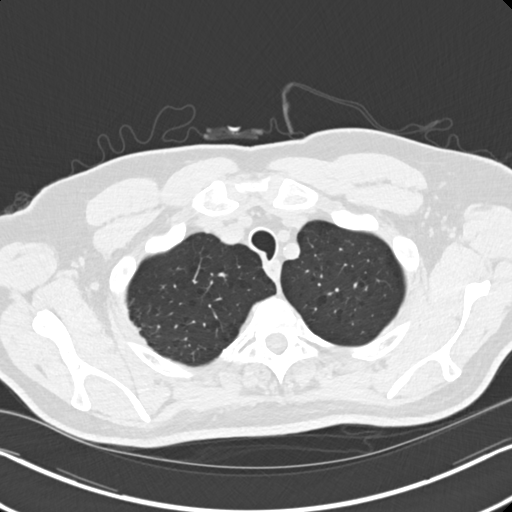
[im 165/179  lung]
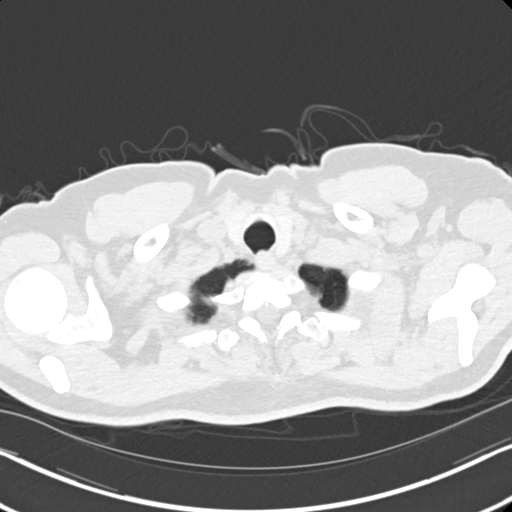

[Series 5: coronals chest 2.00 cor · coronal · 0.70mm/px · 3 of 163 slices shown]
[im 33/163  lung]
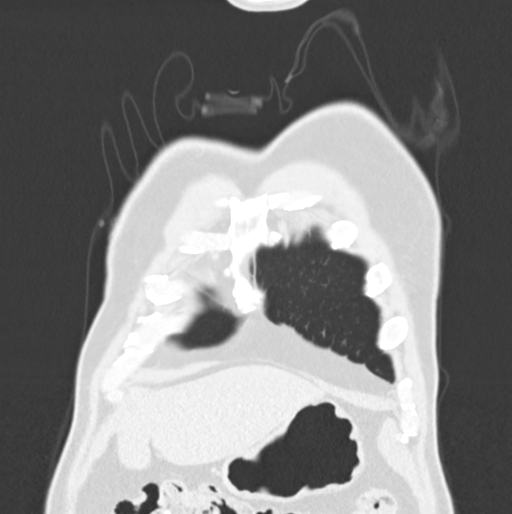
[im 65/163  lung]
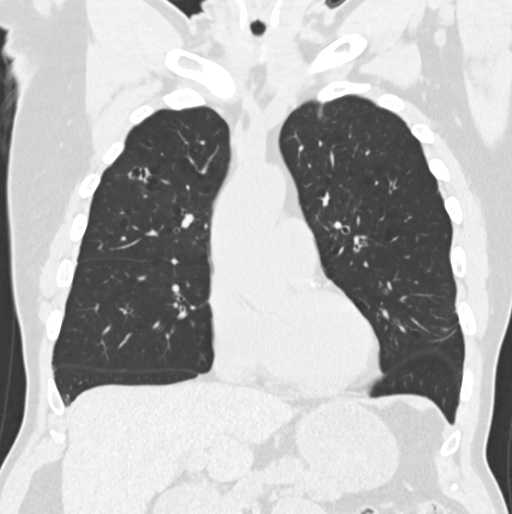
[im 98/163  lung]
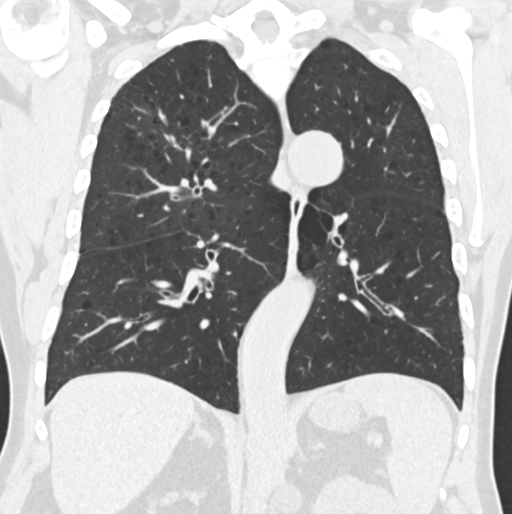

[15 of 36 positions shown; findings below may reference images not displayed]

FINDINGS: Cardiovascular: The heart size appears within normal limits. Aortic
atherosclerosis. Mild increase caliber of the ascending thoracic
aorta measures 4 cm, image [DATE]. Three vessel coronary artery
atherosclerotic calcifications.

Mediastinum/Nodes: No enlarged mediastinal or axillary lymph nodes.
Thyroid gland, trachea, and esophagus demonstrate no significant
findings.

Lungs/Pleura: No pleural effusion identified. Moderate centrilobular
emphysema. Diffuse bronchial wall thickening. Difficult to see
without IV contrast, there is a progressive endobronchial filling
defect within the right upper lobe 1.7 x 1.5 by 2.3 cm, image 94/5.
Measuring 1.3 x 1.2 by 1.5 cm new mild postobstructive pneumonitis
is identified within the lateral right upper lobe, image 40/3. The
irregular, thick walled cavitary lesion within the right upper lobe
measures 2.4 x 1.6 cm, image 61/3. Treated lesion within the
anterior left lower lobe measures 0.9 x 0.5 cm, image 113/3.

Upper Abdomen: No acute findings. Within the left retroperitoneum
there is a low-density structure measuring 1.2 cm, image 175/2.
Unchanged. Asymmetric atrophy of the right kidney.

Musculoskeletal: No chest wall mass or suspicious bone lesions
identified.
IMPRESSION: 1. Interval increase in size of cavitary nodule within the right
upper lobe. Suspicious for underlying neoplasm.
2. Branching, endobronchial filling defect within the right upper
lobe is identified with new postobstructive pneumonitis within the
lateral right upper lobe. In retrospect this was present on the exam
from [DATE] and [DATE]. This is become progressively
increased in size. Differential considerations include mucocele
versus endobronchial tumor. Further investigation further
investigation with PET-CT and/or bronchoscopy advised.
3. Aortic Atherosclerosis ([K7]-[K7]) and Emphysema ([K7]-[K7]).
4. Coronary artery atherosclerotic calcifications
5. Mild aneurysmal dilatation of the ascending thoracic aorta.
Recommend annual imaging followup by CTA or MRA. This recommendation
follows [K7] ACCF/AHA/AATS/ACR/ASA/SCA/TRETO/TRETO/TRETO/TRETO Guidelines
for the Diagnosis and Management of Patients with Thoracic Aortic
Disease. Circulation. [K7]; 121: [id number]. Aortic aneurysm NOS
([K7]-[K7])

## 2019-02-28 ENCOUNTER — Other Ambulatory Visit: Payer: Self-pay

## 2019-02-28 ENCOUNTER — Encounter: Payer: Self-pay | Admitting: Oncology

## 2019-02-28 ENCOUNTER — Inpatient Hospital Stay: Payer: Medicare Other | Attending: Oncology

## 2019-02-28 ENCOUNTER — Inpatient Hospital Stay (HOSPITAL_BASED_OUTPATIENT_CLINIC_OR_DEPARTMENT_OTHER): Payer: Medicare Other | Admitting: Oncology

## 2019-02-28 DIAGNOSIS — Z8521 Personal history of malignant neoplasm of larynx: Secondary | ICD-10-CM | POA: Diagnosis not present

## 2019-02-28 DIAGNOSIS — I110 Hypertensive heart disease with heart failure: Secondary | ICD-10-CM | POA: Insufficient documentation

## 2019-02-28 DIAGNOSIS — R911 Solitary pulmonary nodule: Secondary | ICD-10-CM | POA: Diagnosis not present

## 2019-02-28 DIAGNOSIS — I2510 Atherosclerotic heart disease of native coronary artery without angina pectoris: Secondary | ICD-10-CM | POA: Diagnosis not present

## 2019-02-28 DIAGNOSIS — F17210 Nicotine dependence, cigarettes, uncomplicated: Secondary | ICD-10-CM | POA: Insufficient documentation

## 2019-02-28 DIAGNOSIS — R634 Abnormal weight loss: Secondary | ICD-10-CM | POA: Insufficient documentation

## 2019-02-28 DIAGNOSIS — Z720 Tobacco use: Secondary | ICD-10-CM

## 2019-02-28 DIAGNOSIS — E119 Type 2 diabetes mellitus without complications: Secondary | ICD-10-CM | POA: Insufficient documentation

## 2019-02-28 DIAGNOSIS — C3490 Malignant neoplasm of unspecified part of unspecified bronchus or lung: Secondary | ICD-10-CM

## 2019-02-28 LAB — CBC WITH DIFFERENTIAL/PLATELET
Abs Immature Granulocytes: 0.01 10*3/uL (ref 0.00–0.07)
Basophils Absolute: 0 10*3/uL (ref 0.0–0.1)
Basophils Relative: 1 %
Eosinophils Absolute: 0.6 10*3/uL — ABNORMAL HIGH (ref 0.0–0.5)
Eosinophils Relative: 10 %
HCT: 45 % (ref 39.0–52.0)
Hemoglobin: 15.4 g/dL (ref 13.0–17.0)
Immature Granulocytes: 0 %
Lymphocytes Relative: 32 %
Lymphs Abs: 1.9 10*3/uL (ref 0.7–4.0)
MCH: 32.2 pg (ref 26.0–34.0)
MCHC: 34.2 g/dL (ref 30.0–36.0)
MCV: 93.9 fL (ref 80.0–100.0)
Monocytes Absolute: 0.7 10*3/uL (ref 0.1–1.0)
Monocytes Relative: 11 %
Neutro Abs: 2.7 10*3/uL (ref 1.7–7.7)
Neutrophils Relative %: 46 %
Platelets: 164 10*3/uL (ref 150–400)
RBC: 4.79 MIL/uL (ref 4.22–5.81)
RDW: 12.9 % (ref 11.5–15.5)
WBC: 6 10*3/uL (ref 4.0–10.5)
nRBC: 0 % (ref 0.0–0.2)

## 2019-02-28 LAB — COMPREHENSIVE METABOLIC PANEL
ALT: 11 U/L (ref 0–44)
AST: 14 U/L — ABNORMAL LOW (ref 15–41)
Albumin: 3.5 g/dL (ref 3.5–5.0)
Alkaline Phosphatase: 89 U/L (ref 38–126)
Anion gap: 7 (ref 5–15)
BUN: 16 mg/dL (ref 8–23)
CO2: 27 mmol/L (ref 22–32)
Calcium: 8.8 mg/dL — ABNORMAL LOW (ref 8.9–10.3)
Chloride: 105 mmol/L (ref 98–111)
Creatinine, Ser: 1.24 mg/dL (ref 0.61–1.24)
GFR calc Af Amer: >60 mL/min (ref >60)
GFR calc non Af Amer: 57 mL/min — ABNORMAL LOW (ref >60)
Glucose, Bld: 108 mg/dL — ABNORMAL HIGH (ref 70–99)
Potassium: 4.6 mmol/L (ref 3.5–5.1)
Sodium: 139 mmol/L (ref 135–145)
Total Bilirubin: 0.7 mg/dL (ref 0.3–1.2)
Total Protein: 6.4 g/dL — ABNORMAL LOW (ref 6.5–8.1)

## 2019-02-28 NOTE — Progress Notes (Signed)
Hematology/Oncology Follow up note [facility] [phone] [phone]   Patient Care Team: Venita Lick, NP as PCP - General (Nurse Practitioner) Wellington Hampshire, MD as PCP - Cardiology (Cardiology) Beverly Gust, MD (Otolaryngology) Telford Nab, RN as Registered Nurse  REFERRING PROVIDER: Venita Lick, NP REASON FOR VISIT Follow up for lung nodules and presumed lung cancer s/p SBRT  HISTORY OF PRESENTING ILLNESS:  Philip Wood is a  73 y.o.  male with PMH listed below who was referred to me for evaluation of lung nodule.  Patient follows up with primary care physician and reports significant unintentional weight loss for the past couple of months.   he has history of 31 pack a day smoking history, current smoker so he was referred to lung cancer screening clinic and had a CT chest scan done.  Image Study  CT chest wo 07/21/2017  1. Right upper and left lower lobe nodules, worrisome for synchronous bronchogenic carcinomas. Lung-RADS 4B, suspicious. Additional imaging evaluation or consultation with Pulmonology or Thoracic Surgery recommended. These results will be called to the ordering clinician or representative by the Radiologist Assistant, and communication documented in the PACS or zVision Dashboard. 2. Aortic atherosclerosis (ICD10-170.0). Coronary artery calcification. 3. Ascending aortic aneurysm NOS (ICD10-I71.9). Recommend annual imaging followup by CTA or MRA. This recommendation follows 2010 ACCF/AHA/AATS/ACR/ASA/SCA/SCAI/SIR/STS/SVM Guidelines for the Diagnosis and Management of Patients with Thoracic Aortic Disease. Circulation. 2010; 121: I778-E423. 4.  Emphysema (ICD10-J43.9).  PET scan 07/29/2017  1. The 1.5 cm left lower lobe nodule and the 1.0 by 0.7 cm right upper lobe solid nodule are both hypermetabolic and suspicious for malignancy. In addition, there a 5 mm peribronchovascular nodule in the right  upper lobe with an SUV which, although only 1.6, is disproportionate to the small size of the lesion and accordingly concerning for malignancy. No abnormal hypermetabolic activity in the neck, abdomen/pelvis, or skeleton is identified. No hypermetabolic adenopathy in the chest. 2. Other imaging findings of potential clinical significance: Small mucous retention cyst in the right maxillary sinus. Aortic Atherosclerosis (ICD10-I70.0) and Emphysema (ICD10-J43.9). Coronary atherosclerosis. Prostatomegaly. Prominent stool throughout the colon favors constipation. Descending and proximal sigmoid colon diverticulosis.  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  He got definitive radiation to the larynx, and the patient has been doing annual checkup with ENT Dr. Tami Ribas.  Patient's case was discussed on tumor conference on 07/30/2017 and consensus recommendation is to proceed with ENB biopsy of both lung nodule. Patient present to clinic today accompanied by his wife.  He denies any shortness of breath, chest pain, abdominal pain, hemoptysis, headache   Or double vision.  He continues to smoke half a pack of cigarettes a day  # Pathology of biopsies showed no lesional cells identified. However he is a high risk patient and negative biopsy results do not exclude malignancy.  Discussed with RadOnc Dr.Chrystal. Patient underwent SBRT to the left lung lesion.    INTERVAL HISTORY Philip Wood is a 73 y.o. male who has above history reviewed by me today presents for follow-up visit for management of lung nodules and left lung nodule for presumed SBRT.  Patient had surveillance CT scan done.  Present to discuss results. Patient reports doing well, denies any unintentional weight loss, chest pain, fever,  chills, Patient has a chronic cough. No shortness of breath.   Smokes some days.  Review of Systems  Constitutional: Negative for appetite change, chills, fatigue, fever and unexpected weight change.  HENT:   Negative for hearing loss and voice change.   Eyes: Negative for eye problems and icterus.  Respiratory: Positive for cough. Negative for chest tightness and shortness of breath.   Cardiovascular: Negative for chest pain and leg swelling.  Gastrointestinal: Negative for abdominal distention and abdominal pain.  Endocrine: Negative for hot flashes.  Genitourinary: Negative for difficulty urinating, dysuria and frequency.   Musculoskeletal: Negative for arthralgias.  Skin: Negative for itching and rash.  Neurological: Negative for light-headedness and numbness.  Hematological: Negative for adenopathy. Does not bruise/bleed easily.  Psychiatric/Behavioral: Negative for confusion.      MEDICAL HISTORY:  Past Medical History:  Diagnosis Date   Cancer ([place])    laryngeal   COPD (chronic obstructive pulmonary disease) ([place])    Diabetes mellitus without complication (H[facility])    GERD (gastroesophageal reflux disease)    Hyperlipidemia    Hypertension    Lumbago    Neuropathy    Tobacco abuse disorder     SURGICAL HISTORY: Past Surgical History:  Procedure Laterality Date   CHOLECYSTECTOMY     ELECTROMAGNETIC NAVIGATION BROCHOSCOPY N/A 08/19/2017   Procedure: ELECTROMAGNETIC NAVIGATION BRONCHOSCOPY;  Surgeon: Flora Lipps, MD;  Location: [facility] ORS;  Service: Cardiopulmonary;  Laterality: N/A;   ELECTROMAGNETIC NAVIGATION BROCHOSCOPY Left 09/02/2017   Procedure: ELECTROMAGNETIC NAVIGATION BRONCHOSCOPY;  Surgeon: Flora Lipps, MD;  Location: [facility] ORS;  Service: Cardiopulmonary;  Laterality: Left;   THROAT SURGERY      SOCIAL HISTORY: Social History   Socioeconomic History   Marital status: Married    Spouse name: Not on file   Number of children: Not on file    Years of education: Not on file   Highest education level: 10th grade  Occupational History   Not on file  Social Needs   Financial resource strain: Not hard at all   Food insecurity    Worry: Never true    Inability: Never true   Transportation needs    Medical: No    Non-medical: No  Tobacco Use   Smoking status: Light Tobacco Smoker    Packs/day: 0.60    Years: 53.00    Pack years: 31.80    Types: Cigarettes   Smokeless tobacco: Never Used   Tobacco comment: Aprrox 3-4 cigs/day  Substance and Sexual Activity   Alcohol use: No    Alcohol/week: 0.0 standard drinks   Drug use: No   Sexual activity: Not Currently  Lifestyle   Physical activity    Days per week: 0 days    Minutes per session: 0 min   Stress: Not at all  Relationships   Social connections    Talks on phone: Twice a week    Gets together: Once a week    Attends religious service: More than 4 times per year    Active member of club or organization: No    Attends meetings of clubs or organizations: Never    Relationship status: Married   Intimate partner violence    Fear of current or ex partner: No    Emotionally abused: No    Physically abused: No    Forced sexual activity: No  Other Topics Concern   Not on file  Social History Narrative   Works part time at [employer]    FAMILY HISTORY: Family History  Problem Relation Age of Onset   Heart disease Father    Heart attack Father    Brain cancer Sister    Cervical cancer Sister    Rectal cancer Brother    Lung cancer Brother    Lung cancer Brother    Lung cancer Brother    Stomach cancer Sister    Lung cancer Brother    Stomach cancer Sister    Diabetes Sister    Lupus Sister     ALLERGIES:  has No Known Allergies.  MEDICATIONS:  Current Outpatient Medications  Medication Sig Dispense Refill   allopurinol (ZYLOPRIM) 100 MG tablet Take 1 tablet (100 mg total) by mouth daily. 90 tablet 2   aspirin 81 MG tablet  Take 81 mg by mouth daily.     atorvastatin (LIPITOR) 80 MG tablet Take 1 tablet (80 mg total) by mouth daily at 6 PM. 90 tablet 3   metFORMIN (GLUCOPHAGE) 500 MG tablet Take 1 tablet (500 mg total) by mouth 2 (two) times daily with a meal. 180 tablet 1   metoprolol succinate (TOPROL XL) 25 MG 24 hr tablet Take 1 tablet (25 mg total) by mouth daily. 90 tablet 3   mupirocin ointment (BACTROBAN) 2 % Apply two times a day for 7 days. 22 g 0   ramipril (ALTACE) 10 MG capsule Take 1 capsule (10 mg total) by mouth daily. 90 capsule 3   tiotropium (SPIRIVA HANDIHALER) 18 MCG inhalation capsule Place 1 capsule (18 mcg total) into inhaler and inhale daily. 90 capsule 1   No current facility-administered medications for this visit.      PHYSICAL EXAMINATION: ECOG PERFORMANCE STATUS: 1 - Symptomatic but completely ambulatory Vitals:   02/28/19 1004  BP: 136/80  Pulse: 67  Resp: 16  Temp: (!) 95.1 F (35.1 C)   Filed Weights   02/28/19 1004  Weight: 184 lb 3.2 oz (83.6 kg)  Physical Exam  Constitutional: He is oriented to person, place, and time. No distress.  HENT:  Head: Normocephalic and atraumatic.  Nose: Nose normal.  Mouth/Throat: Oropharynx is clear and moist. No oropharyngeal exudate.  Eyes: Pupils are equal, round, and reactive to light. EOM are normal. No scleral icterus.  Neck: Normal range of motion. Neck supple.  Cardiovascular: Normal rate and regular rhythm.  No murmur heard. Pulmonary/Chest: Effort normal. No respiratory distress. He has no rales. He exhibits no tenderness.  Severely decreased breath sound  Abdominal: Soft. He exhibits no distension. There is no abdominal tenderness.  Musculoskeletal: Normal range of motion.        General: No edema.  Neurological: He is alert and oriented to person, place, and time. No cranial nerve deficit. He exhibits normal muscle tone. Coordination normal.  Skin: Skin is warm and dry. He is not diaphoretic. No erythema.    Psychiatric: Affect normal.          LABORATORY DATA:  I have reviewed the data as listed Lab Results  Component Value Date   WBC 6.0 02/28/2019   HGB 15.4 02/28/2019   HCT 45.0 02/28/2019   MCV 93.9 02/28/2019   PLT 164 02/28/2019   Recent Labs    08/12/18 0841 08/31/18 0917 11/16/18 0934 02/28/19 0927  NA 148* 141 140 139  K 4.5  --  4.9 4.6  CL 106  --  100 105  CO2 26  --  25 27  GLUCOSE 113*  --  105* 108*  BUN 14  --  15 16  CREATININE  1.13  --  1.12 1.24  CALCIUM 9.6  --  9.2 8.8*  GFRNONAA 65  --  65 57*  GFRAA 75  --  75 >60  PROT 6.0  --   --  6.4*  ALBUMIN 4.1  --   --  3.5  AST 8  --   --  14*  ALT 8  --   --  11  ALKPHOS 101  --   --  89  BILITOT 0.4  --   --  0.7   RADIOGRAPHIC STUDIES: I have personally reviewed the radiological images as listed and agreed with the findings in the report.  11/25/2017 CT chest wo 10 x 11 mm left lower lobe pulmonary nodule decreased.  10 x 14 mm right lower lobe with decreasing solid component.  As above.  Aortic atherosclerosis and emphysema.  03/01/2018 CT chest wo Slight interval enlargement of what is now a 12 x 15 mm mixed solid and sub solid nodule in the right upper lobe (axial image 61 of series 3). This remains concerning for potential slow-growing neoplasm such as an adenocarcinoma. 2. Previously noted left lower lobe nodule appears slightly smaller than the prior study, currently measuring 7 x 11 mm. 3. Mild diffuse bronchial wall thickening with mild to moderate centrilobular emphysema; imaging findings suggestive of underlying COPD. 4. Aortic atherosclerosis, in addition to left main and 3 vessel coronary artery disease. Assessment for potential risk factor modification, dietary therapy or pharmacologic therapy may be warranted, if clinically indicated. 5. There are calcifications of the aortic valve. Echocardiographic correlation for evaluation of potential valvular dysfunction may be warranted if  clinically indicated.Aortic Atherosclerosis (ICD10-I70.0) and Emphysema ([id number]  ASSESSMENT & PLAN:  1. Solitary pulmonary nodule   Presumed lung cancer, status post SBRT of left lung nodule. #CT chest was independent reviewed by me and discussed with patient. Right upper lobe nodule has interval increase in size, branching endobronchial filling defect within the right upper lobe is identified with new postobstructive pneumonitis.  This became progressively increased in size.  I had a lengthy discussion with patient today.  Possible disease progression was discussed. Patient was never able to tissue diagnosis established.  Status post previous bronchoscopy, pathology was negative. I discussed with patient that I recommend patient to proceed with a repeat bronchoscopy, trying to obtain additional tissue for biopsy  Patient has chronic heart failure, this need to be cleared by his cardiologist. I will discuss patient's case on this weeks tumor board and update patient.  # Smoking cessation was discussed with patient again. # Squamous cell carcinoma of larynx, annual check up with ENT Dr.McQueen.   All questions were answered. The patient knows to call the clinic with any problems questions or concerns. We spent sufficient time to discuss many aspect of care, questions were answered to patient's satisfaction. Total face to face encounter time for this patient visit was 25 min. >50% of the time was  spent in counseling and coordination of care.   Follow up in to be determined   Earlie Server, MD, PhD Hematology Oncology [facility] at [facility] Pager- [phone] 02/28/2019

## 2019-02-28 NOTE — Progress Notes (Signed)
Patient does not offer any problems today.  

## 2019-03-03 ENCOUNTER — Other Ambulatory Visit: Payer: Medicare Other

## 2019-03-03 DIAGNOSIS — R911 Solitary pulmonary nodule: Secondary | ICD-10-CM

## 2019-03-03 NOTE — Progress Notes (Signed)
Tumor Board Documentation  Philip Wood was presented by Dr Tasia Catchings at our Tumor Board on 03/03/2019, which included representatives from medical oncology, radiation oncology, radiology, surgical, pathology, navigation, PCP, pharmacy, palliative care, research.  Philip Wood currently presents as a current patient, for discussion with history of the following treatments: neoadjuvant chemoradiation.  Additionally, we reviewed previous medical and familial history, history of present illness, and recent lab results along with all available histopathologic and imaging studies. The tumor board considered available treatment options and made the following recommendations: Additional screening PET scan, refer to Radiation Oncology for possible SBRT to right Lung  The following procedures/referrals were also placed:  Orders Placed This Encounter  Procedures  . NM PET Image Restag (PS) Skull Base To Thigh    Clinical Trial Status: not discussed   Staging used:    AJCC Staging:       Group: Stage 1 Head and Neck cancer   National site-specific guidelines   were discussed with respect to the case.  Tumor board is a meeting of clinicians from various specialty areas who evaluate and discuss patients for whom a multidisciplinary approach is being considered. Final determinations in the plan of care are those of the provider(s). The responsibility for follow up of recommendations given during tumor board is that of the provider.   Today's extended care, comprehensive team conference, Philip Wood was not present for the discussion and was not examined.   Multidisciplinary Tumor Board is a multidisciplinary case peer review process.  Decisions discussed in the Multidisciplinary Tumor Board reflect the opinions of the specialists present at the conference without having examined the patient.  Ultimately, treatment and diagnostic decisions rest with the primary provider(s) and the patient.

## 2019-03-04 ENCOUNTER — Telehealth: Payer: Self-pay | Admitting: *Deleted

## 2019-03-04 NOTE — Telephone Encounter (Signed)
Left message with pt's wife, Deneise Lever, regarding recommendations for tumor board discussion on 11/12 and to review upcoming appts. Awaiting call back.

## 2019-03-07 ENCOUNTER — Telehealth: Payer: Self-pay | Admitting: *Deleted

## 2019-03-07 NOTE — Telephone Encounter (Signed)
Spoke with pt and his wife, Deneise Lever, to inform of recommendations for case conference last week. Reviewed upcoming appts for PET scan and follow up with Dr. Baruch Gouty. Pt and wife verbalized understanding. Nothing further needed at this time.

## 2019-03-10 ENCOUNTER — Other Ambulatory Visit: Payer: Self-pay

## 2019-03-10 ENCOUNTER — Encounter
Admission: RE | Admit: 2019-03-10 | Discharge: 2019-03-10 | Disposition: A | Discharge status: Home / Self Care | Payer: Medicare Other | Source: Ambulatory Visit | Attending: Oncology | Admitting: Oncology

## 2019-03-10 DIAGNOSIS — J439 Emphysema, unspecified: Secondary | ICD-10-CM | POA: Diagnosis not present

## 2019-03-10 DIAGNOSIS — R911 Solitary pulmonary nodule: Secondary | ICD-10-CM | POA: Diagnosis not present

## 2019-03-10 DIAGNOSIS — I700 Atherosclerosis of aorta: Secondary | ICD-10-CM | POA: Diagnosis not present

## 2019-03-10 DIAGNOSIS — R918 Other nonspecific abnormal finding of lung field: Secondary | ICD-10-CM | POA: Insufficient documentation

## 2019-03-10 DIAGNOSIS — I2510 Atherosclerotic heart disease of native coronary artery without angina pectoris: Secondary | ICD-10-CM | POA: Insufficient documentation

## 2019-03-10 DIAGNOSIS — N400 Enlarged prostate without lower urinary tract symptoms: Secondary | ICD-10-CM | POA: Diagnosis not present

## 2019-03-10 DIAGNOSIS — Z85118 Personal history of other malignant neoplasm of bronchus and lung: Secondary | ICD-10-CM | POA: Diagnosis not present

## 2019-03-10 LAB — GLUCOSE, CAPILLARY: Glucose-Capillary: 108 mg/dL — ABNORMAL HIGH (ref 70–99)

## 2019-03-10 MED ORDER — FLUDEOXYGLUCOSE F - 18 (FDG) INJECTION
9.5000 | Freq: Once | INTRAVENOUS | Status: AC | PRN
  Administered 2019-03-10: 9.82 via INTRAVENOUS

## 2019-03-14 ENCOUNTER — Institutional Professional Consult (permissible substitution): Payer: Medicare Other | Admitting: Radiation Oncology

## 2019-03-14 ENCOUNTER — Other Ambulatory Visit: Payer: Self-pay

## 2019-03-15 ENCOUNTER — Encounter: Payer: Self-pay | Admitting: Radiation Oncology

## 2019-03-15 ENCOUNTER — Other Ambulatory Visit: Payer: Self-pay

## 2019-03-15 ENCOUNTER — Ambulatory Visit
Admission: RE | Admit: 2019-03-15 | Discharge: 2019-03-15 | Disposition: A | Discharge status: Home / Self Care | Payer: Medicare Other | Source: Ambulatory Visit | Attending: Radiation Oncology | Admitting: Radiation Oncology

## 2019-03-15 VITALS — BP 169/95 | HR 73 | Temp 95.3°F | Resp 18 | Wt 181.8 lb

## 2019-03-15 DIAGNOSIS — C3432 Malignant neoplasm of lower lobe, left bronchus or lung: Secondary | ICD-10-CM | POA: Diagnosis not present

## 2019-03-15 DIAGNOSIS — Z923 Personal history of irradiation: Secondary | ICD-10-CM | POA: Insufficient documentation

## 2019-03-15 DIAGNOSIS — F17210 Nicotine dependence, cigarettes, uncomplicated: Secondary | ICD-10-CM | POA: Insufficient documentation

## 2019-03-15 DIAGNOSIS — C3490 Malignant neoplasm of unspecified part of unspecified bronchus or lung: Secondary | ICD-10-CM

## 2019-03-15 DIAGNOSIS — C7801 Secondary malignant neoplasm of right lung: Secondary | ICD-10-CM | POA: Diagnosis not present

## 2019-03-15 NOTE — Progress Notes (Signed)
Radiation Oncology Follow up Note  Name: Philip Wood   Date:   03/15/2019 MRN:  [id number] DOB: [birth year]    This 73 y.o. male presents to the clinic today for reevaluation of 2 new hypermetabolic lesions in the right upper lobe and patient now about a year and a half having completed SBRT for stage I left lower lobe non-small cell lung cancer.Marland Kitchen  REFERRING PROVIDER: Venita Lick, NP  HPI: Patient is a 73 year old male now about a year and a half having completed SBRT to his left lower lobe for a stage I non-small cell lung cancer..  As far as back as April in 2019 he also had to right upper lobe lesions worrisome for bronchogenic carcinoma which was being followed.  PET scan back in April 2019 also confirmed hypermetabolic activity in the 1.5 cm left lower lobe nodule as well as a 1 and 0.7 cm right upper lobe solid nodules both hypermetabolic suspicious for malignancy.  Patient is is also received radiation therapy to his larynx for stage T1N1 squamous cell carcinoma.  He also had back in 2019 attempted biopsy of the right upper lobe nodules which were negative for malignancy.  Recent PET CT scan showed left lower lobe pulmonary nodule has been resected although that was a complete response of stereotactic radiosurgery.  The right upper lobe nodules have increased in hypermetabolic activity and enlarged consistent with progressive disease.  He is seen today for evaluation and recommendations for treatment of those lesions.  He is otherwise doing well has a slight productive cough no hemoptysis or chest tightness.  COMPLICATIONS OF TREATMENT: none  FOLLOW UP COMPLIANCE: keeps appointments   PHYSICAL EXAM:  BP (!) 169/95 (BP Location: Left Arm, Patient Position: Sitting)   Pulse 73   Temp (!) 95.3 F (35.2 C) (Tympanic)   Resp 18   Wt 181 lb 12.8 oz (82.5 kg)   BMI 26.85 kg/m  Well-developed well-nourished patient in NAD. HEENT reveals PERLA, EOMI, discs not visualized.  Oral  cavity is clear. No oral mucosal lesions are identified. Neck is clear without evidence of cervical or supraclavicular adenopathy. Lungs are clear to A&P. Cardiac examination is essentially unremarkable with regular rate and rhythm without murmur rub or thrill. Abdomen is benign with no organomegaly or masses noted. Motor sensory and DTR levels are equal and symmetric in the upper and lower extremities. Cranial nerves II through XII are grossly intact. Proprioception is intact. No peripheral adenopathy or edema is identified. No motor or sensory levels are noted. Crude visual fields are within normal range.  RADIOLOGY RESULTS: CT scans and PET CT scans reviewed compatible with above-stated findings  PLAN: At this time I have recommended SBRT to both right upper lobe lesions.  We can treat them simultaneously.  Would plan on delivering 6000 cGy in 5 fractions.  Would use motion restriction and 4-dimensional treatment planning during simulation as well as PET CT fusion study.  Risks and benefits of treatment including possible development of cough fatigue slight chance of radiation esophagitis and skin reaction all were discussed in detail with the patient.  I personally set up and ordered CT simulation for next week.  Patient comprehends my treatment plan well.  I would like to take this opportunity to thank you for allowing me to participate in the care of your patient.[name], MD

## 2019-03-22 ENCOUNTER — Ambulatory Visit
Admission: RE | Admit: 2019-03-22 | Discharge: 2019-03-22 | Disposition: A | Discharge status: Home / Self Care | Payer: Medicare Other | Source: Ambulatory Visit | Attending: Radiation Oncology | Admitting: Radiation Oncology

## 2019-03-22 ENCOUNTER — Other Ambulatory Visit: Payer: Self-pay

## 2019-03-22 DIAGNOSIS — C7801 Secondary malignant neoplasm of right lung: Secondary | ICD-10-CM | POA: Diagnosis not present

## 2019-03-22 DIAGNOSIS — C3411 Malignant neoplasm of upper lobe, right bronchus or lung: Secondary | ICD-10-CM | POA: Insufficient documentation

## 2019-03-22 DIAGNOSIS — C3432 Malignant neoplasm of lower lobe, left bronchus or lung: Secondary | ICD-10-CM | POA: Diagnosis not present

## 2019-03-22 DIAGNOSIS — Z510 Encounter for antineoplastic radiation therapy: Secondary | ICD-10-CM | POA: Diagnosis not present

## 2019-03-25 DIAGNOSIS — C7801 Secondary malignant neoplasm of right lung: Secondary | ICD-10-CM | POA: Diagnosis not present

## 2019-03-25 DIAGNOSIS — C3432 Malignant neoplasm of lower lobe, left bronchus or lung: Secondary | ICD-10-CM | POA: Diagnosis not present

## 2019-03-25 DIAGNOSIS — C3411 Malignant neoplasm of upper lobe, right bronchus or lung: Secondary | ICD-10-CM | POA: Diagnosis not present

## 2019-03-25 DIAGNOSIS — Z510 Encounter for antineoplastic radiation therapy: Secondary | ICD-10-CM | POA: Diagnosis not present

## 2019-03-29 ENCOUNTER — Ambulatory Visit
Admission: RE | Admit: 2019-03-29 | Discharge: 2019-03-29 | Disposition: A | Discharge status: Home / Self Care | Payer: Medicare Other | Source: Ambulatory Visit | Attending: Radiation Oncology | Admitting: Radiation Oncology

## 2019-03-29 DIAGNOSIS — Z510 Encounter for antineoplastic radiation therapy: Secondary | ICD-10-CM | POA: Diagnosis not present

## 2019-03-29 DIAGNOSIS — C3411 Malignant neoplasm of upper lobe, right bronchus or lung: Secondary | ICD-10-CM | POA: Diagnosis not present

## 2019-03-31 ENCOUNTER — Other Ambulatory Visit: Payer: Self-pay

## 2019-03-31 ENCOUNTER — Ambulatory Visit
Admission: RE | Admit: 2019-03-31 | Discharge: 2019-03-31 | Disposition: A | Discharge status: Home / Self Care | Payer: Medicare Other | Source: Ambulatory Visit | Attending: Radiation Oncology | Admitting: Radiation Oncology

## 2019-03-31 DIAGNOSIS — C3411 Malignant neoplasm of upper lobe, right bronchus or lung: Secondary | ICD-10-CM | POA: Diagnosis not present

## 2019-03-31 DIAGNOSIS — Z510 Encounter for antineoplastic radiation therapy: Secondary | ICD-10-CM | POA: Diagnosis not present

## 2019-04-05 ENCOUNTER — Other Ambulatory Visit: Payer: Self-pay

## 2019-04-05 ENCOUNTER — Ambulatory Visit
Admission: RE | Admit: 2019-04-05 | Discharge: 2019-04-05 | Disposition: A | Discharge status: Home / Self Care | Payer: Medicare Other | Source: Ambulatory Visit | Attending: Radiation Oncology | Admitting: Radiation Oncology

## 2019-04-05 DIAGNOSIS — Z510 Encounter for antineoplastic radiation therapy: Secondary | ICD-10-CM | POA: Diagnosis not present

## 2019-04-05 DIAGNOSIS — C3411 Malignant neoplasm of upper lobe, right bronchus or lung: Secondary | ICD-10-CM | POA: Diagnosis not present

## 2019-04-07 ENCOUNTER — Ambulatory Visit
Admission: RE | Admit: 2019-04-07 | Discharge: 2019-04-07 | Disposition: A | Discharge status: Home / Self Care | Payer: Medicare Other | Source: Ambulatory Visit | Attending: Radiation Oncology | Admitting: Radiation Oncology

## 2019-04-07 ENCOUNTER — Other Ambulatory Visit: Payer: Self-pay

## 2019-04-07 DIAGNOSIS — C3411 Malignant neoplasm of upper lobe, right bronchus or lung: Secondary | ICD-10-CM | POA: Diagnosis not present

## 2019-04-07 DIAGNOSIS — Z510 Encounter for antineoplastic radiation therapy: Secondary | ICD-10-CM | POA: Diagnosis not present

## 2019-04-12 ENCOUNTER — Ambulatory Visit
Admission: RE | Admit: 2019-04-12 | Discharge: 2019-04-12 | Disposition: A | Discharge status: Home / Self Care | Payer: Medicare Other | Source: Ambulatory Visit | Attending: Radiation Oncology | Admitting: Radiation Oncology

## 2019-04-12 ENCOUNTER — Other Ambulatory Visit: Payer: Self-pay

## 2019-04-12 DIAGNOSIS — C3411 Malignant neoplasm of upper lobe, right bronchus or lung: Secondary | ICD-10-CM | POA: Diagnosis not present

## 2019-04-12 DIAGNOSIS — Z510 Encounter for antineoplastic radiation therapy: Secondary | ICD-10-CM | POA: Diagnosis not present

## 2019-04-12 DIAGNOSIS — C7801 Secondary malignant neoplasm of right lung: Secondary | ICD-10-CM | POA: Diagnosis not present

## 2019-04-27 ENCOUNTER — Telehealth: Payer: Self-pay | Admitting: Nurse Practitioner

## 2019-04-27 ENCOUNTER — Other Ambulatory Visit: Payer: Self-pay | Admitting: Nurse Practitioner

## 2019-04-27 MED ORDER — SPIRIVA HANDIHALER 18 MCG IN CAPS: 18.0000 ug | ORAL_CAPSULE | Freq: Every day | RESPIRATORY_TRACT | 3 refills | Status: DC

## 2019-04-27 NOTE — Telephone Encounter (Signed)
Refill sent.

## 2019-04-27 NOTE — Telephone Encounter (Signed)
Pt is here in office stating he needs a refill of spirivia and wants it sent to [facility] [place] instead of [facility], since he is no longer using them.

## 2019-04-27 NOTE — Telephone Encounter (Signed)
PT understood and verbalized understanding.

## 2019-05-13 ENCOUNTER — Ambulatory Visit: Payer: Medicare Other | Admitting: Radiation Oncology

## 2019-05-17 ENCOUNTER — Other Ambulatory Visit: Payer: Self-pay

## 2019-05-17 ENCOUNTER — Encounter: Payer: Self-pay | Admitting: Radiation Oncology

## 2019-05-17 NOTE — Progress Notes (Signed)
Patient pre screened for office appointment, no questions or concerns today. Patient reminded of upcoming appointment time and date. 

## 2019-05-18 ENCOUNTER — Ambulatory Visit
Admission: RE | Admit: 2019-05-18 | Discharge: 2019-05-18 | Disposition: A | Discharge status: Home / Self Care | Payer: Medicare Other | Source: Ambulatory Visit | Attending: Radiation Oncology | Admitting: Radiation Oncology

## 2019-05-18 ENCOUNTER — Other Ambulatory Visit: Payer: Self-pay | Admitting: *Deleted

## 2019-05-18 ENCOUNTER — Other Ambulatory Visit: Payer: Self-pay

## 2019-05-18 DIAGNOSIS — Z923 Personal history of irradiation: Secondary | ICD-10-CM | POA: Diagnosis not present

## 2019-05-18 DIAGNOSIS — C7801 Secondary malignant neoplasm of right lung: Secondary | ICD-10-CM | POA: Insufficient documentation

## 2019-05-18 DIAGNOSIS — C3490 Malignant neoplasm of unspecified part of unspecified bronchus or lung: Secondary | ICD-10-CM

## 2019-05-18 DIAGNOSIS — F17210 Nicotine dependence, cigarettes, uncomplicated: Secondary | ICD-10-CM | POA: Diagnosis not present

## 2019-05-18 DIAGNOSIS — C3432 Malignant neoplasm of lower lobe, left bronchus or lung: Secondary | ICD-10-CM | POA: Diagnosis not present

## 2019-05-18 NOTE — Progress Notes (Signed)
Radiation Oncology Follow up Note  Name: Philip Wood   Date:   05/18/2019 MRN:  [id number] DOB: 01-17-1946    This 74 y.o. male presents to the clinic today for 1 month follow-up status post SBRT treatment to his right upper lobe and patient previously having SBRT for stage I left lower lobe non-small cell lung cancer.  REFERRING PROVIDER: Venita Lick, NP  HPI: Patient is a 74 year old male now seen at 1 month having completed radiation therapy to the right upper lobe for 2 new hypermetabolic lesions consistent with early stage lung cancer.  He is also status post SBRT almost 2 years ago for a left lower lobe stage I non-small cell lung cancer.  Seen today in routine follow-up he is doing well he specifically denies hemoptysis chest tightness or dyspnea on exertion.  He does have a slight chronic cough clear more pronounced in the morning..  COMPLICATIONS OF TREATMENT: none  FOLLOW UP COMPLIANCE: keeps appointments   PHYSICAL EXAM:  BP (!) (P) 160/93 (BP Location: Left Arm, Patient Position: Sitting)   Pulse (P) 85   Temp (!) (P) 95 F (35 C) (Tympanic)   Resp (P) 16   Wt (P) 181 lb 8 oz (82.3 kg)   BMI (P) 26.80 kg/m  Well-developed well-nourished patient in NAD. HEENT reveals PERLA, EOMI, discs not visualized.  Oral cavity is clear. No oral mucosal lesions are identified. Neck is clear without evidence of cervical or supraclavicular adenopathy. Lungs are clear to A&P. Cardiac examination is essentially unremarkable with regular rate and rhythm without murmur rub or thrill. Abdomen is benign with no organomegaly or masses noted. Motor sensory and DTR levels are equal and symmetric in the upper and lower extremities. Cranial nerves II through XII are grossly intact. Proprioception is intact. No peripheral adenopathy or edema is identified. No motor or sensory levels are noted. Crude visual fields are within normal range.  RADIOLOGY RESULTS: No current films to review CT scan  ordered in 3 months  PLAN: Present time of asked to see him back in 3 months for follow-up with a CT scan of the chest with contrast prior to his visit.  I am pleased with his overall progress.  He is stable from a pulmonary standpoint.  Patient knows to call with any concerns.  I would like to take this opportunity to thank you for allowing me to participate in the care of your patient.[name], MD

## 2019-05-24 ENCOUNTER — Encounter: Payer: Self-pay | Admitting: Nurse Practitioner

## 2019-05-24 ENCOUNTER — Ambulatory Visit (INDEPENDENT_AMBULATORY_CARE_PROVIDER_SITE_OTHER): Payer: Medicare Other | Admitting: Nurse Practitioner

## 2019-05-24 ENCOUNTER — Other Ambulatory Visit: Payer: Self-pay

## 2019-05-24 VITALS — BP 128/74 | HR 68 | Temp 97.7°F

## 2019-05-24 DIAGNOSIS — C3490 Malignant neoplasm of unspecified part of unspecified bronchus or lung: Secondary | ICD-10-CM | POA: Diagnosis not present

## 2019-05-24 DIAGNOSIS — N1831 Chronic kidney disease, stage 3a: Secondary | ICD-10-CM | POA: Diagnosis not present

## 2019-05-24 DIAGNOSIS — E0821 Diabetes mellitus due to underlying condition with diabetic nephropathy: Secondary | ICD-10-CM | POA: Diagnosis not present

## 2019-05-24 DIAGNOSIS — I130 Hypertensive heart and chronic kidney disease with heart failure and stage 1 through stage 4 chronic kidney disease, or unspecified chronic kidney disease: Secondary | ICD-10-CM

## 2019-05-24 DIAGNOSIS — E785 Hyperlipidemia, unspecified: Secondary | ICD-10-CM

## 2019-05-24 DIAGNOSIS — I5022 Chronic systolic (congestive) heart failure: Secondary | ICD-10-CM

## 2019-05-24 DIAGNOSIS — N183 Chronic kidney disease, stage 3 unspecified: Secondary | ICD-10-CM

## 2019-05-24 DIAGNOSIS — M1A3710 Chronic gout due to renal impairment, right ankle and foot, without tophus (tophi): Secondary | ICD-10-CM

## 2019-05-24 DIAGNOSIS — E1169 Type 2 diabetes mellitus with other specified complication: Secondary | ICD-10-CM | POA: Diagnosis not present

## 2019-05-24 DIAGNOSIS — F17219 Nicotine dependence, cigarettes, with unspecified nicotine-induced disorders: Secondary | ICD-10-CM

## 2019-05-24 DIAGNOSIS — J449 Chronic obstructive pulmonary disease, unspecified: Secondary | ICD-10-CM

## 2019-05-24 LAB — MICROALBUMIN, URINE WAIVED
Creatinine, Urine Waived: 200 mg/dL (ref 10–300)
Microalb, Ur Waived: 80 mg/L — ABNORMAL HIGH (ref 0–19)

## 2019-05-24 LAB — BAYER DCA HB A1C WAIVED: HB A1C (BAYER DCA - WAIVED): 5.7 % (ref <7.0)

## 2019-05-24 MED ORDER — ALLOPURINOL 100 MG PO TABS: 100.0000 mg | ORAL_TABLET | Freq: Every day | ORAL | 2 refills | Status: DC

## 2019-05-24 MED ORDER — RAMIPRIL 10 MG PO CAPS: 10.0000 mg | ORAL_CAPSULE | Freq: Every day | ORAL | 3 refills | Status: DC

## 2019-05-24 MED ORDER — METFORMIN HCL 500 MG PO TABS: 500.0000 mg | ORAL_TABLET | Freq: Two times a day (BID) | ORAL | 1 refills | Status: DC

## 2019-05-24 MED ORDER — METOPROLOL SUCCINATE ER 25 MG PO TB24: 25.0000 mg | ORAL_TABLET | Freq: Every day | ORAL | 3 refills | Status: DC

## 2019-05-24 MED ORDER — ATORVASTATIN CALCIUM 80 MG PO TABS: 80.0000 mg | ORAL_TABLET | Freq: Every day | ORAL | 3 refills | Status: DC

## 2019-05-24 NOTE — Assessment & Plan Note (Signed)
Chronic, ongoing with initial elevation in BP today and repeat at goal.  Home BP at goal.  Continue current medication regimen and collaboration with cardiology.  CMP today.

## 2019-05-24 NOTE — Assessment & Plan Note (Signed)
Continue collaboration with oncology.  Recommend complete cessation of smoking.

## 2019-05-24 NOTE — Assessment & Plan Note (Signed)
I have recommended complete cessation of tobacco use. I have discussed various options available for assistance with tobacco cessation including over the counter methods (Nicotine gum, patch and lozenges). We also discussed prescription options (Chantix, Nicotine Inhaler / Nasal Spray). The patient is not interested in pursuing any prescription tobacco cessation options at this time.  

## 2019-05-24 NOTE — Assessment & Plan Note (Signed)
Chronic, ongoing.  Continue current medication regimen.  Recommend complete smoking cessation.  Consider addition of Albuterol at next visit as rescue inhaler.  At this time he denies symptoms.

## 2019-05-24 NOTE — Assessment & Plan Note (Signed)
Chronic, ongoing.  A1C 5.7% today.  Urine ALB 80 and A:C 30-300, continue Ramipril for kidney protection.  Continue current medication regimen and adjust as needed.  Recommend he check BS at home at least a few mornings a week.  Consider reduction of medication next visit, if remains stable.  Return in 3 months.

## 2019-05-24 NOTE — Patient Instructions (Signed)
Heart Failure, Self Care Heart failure is a serious condition. This sheet explains things you need to do to take care of yourself at home. To help you stay as healthy as possible, you may be asked to change your diet, take certain medicines, and make other changes in your life. Your doctor may also give you more specific instructions. If you have problems or questions, call your doctor. What are the risks? Having heart failure makes it more likely for you to have some problems. These problems can get worse if you do not take good care of yourself. Problems may include:  Blood clotting problems. This may cause a stroke.  Damage to the kidneys, liver, or lungs.  Abnormal heart rhythms. Supplies needed:  Scale for weighing yourself.  Blood pressure monitor.  Notebook.  Medicines. How to care for yourself when you have heart failure Medicines Take over-the-counter and prescription medicines only as told by your doctor. Take your medicines every day.  Do not stop taking your medicine unless your doctor tells you to do so.  Do not skip any medicines.  Get your prescriptions refilled before you run out of medicine. This is important. Eating and drinking   Eat heart-healthy foods. Talk with a diet specialist (dietitian) to create an eating plan.  Choose foods that: ? Have no trans fat. ? Are low in saturated fat and cholesterol.  Choose healthy foods, such as: ? Fresh or frozen fruits and vegetables. ? Fish. ? Low-fat (lean) meats. ? Legumes, such as beans, peas, and lentils. ? Fat-free or low-fat dairy products. ? Whole-grain foods. ? High-fiber foods.  Limit salt (sodium) if told by your doctor. Ask your diet specialist to tell you which seasonings are healthy for your heart.  Cook in healthy ways instead of frying. Healthy ways of cooking include roasting, grilling, broiling, baking, poaching, steaming, and stir-frying.  Limit how much fluid you drink, if told by your  doctor. Alcohol use  Do not drink alcohol if: ? Your doctor tells you not to drink. ? Your heart was damaged by alcohol, or you have very bad heart failure. ? You are pregnant, may be pregnant, or are planning to become pregnant.  If you drink alcohol: ? Limit how much you use to:  0-1 drink a day for women.  0-2 drinks a day for men. ? Be aware of how much alcohol is in your drink. In the [place] one drink equals one 12 oz bottle of beer (355 mL), one 5 oz glass of wine (148 mL), or one 1 oz glass of hard liquor (44 mL). Lifestyle   Do not use any products that contain nicotine or tobacco, such as cigarettes, e-cigarettes, and chewing tobacco. If you need help quitting, ask your doctor. ? Do not use nicotine gum or patches before talking to your doctor.  Do not use illegal drugs.  Lose weight if told by your doctor.  Do physical activity if told by your doctor. Talk to your doctor before you begin an exercise if: ? You are an older adult. ? You have very bad heart failure.  Learn to manage stress. If you need help, ask your doctor.  Get rehab (rehabilitation) to help you stay independent and to help with your quality of life.  Plan time to rest when you get tired. Check weight and blood pressure   Weigh yourself every day. This will help you to know if fluid is building up in your body. ? Weigh yourself every morning   after you pee (urinate) and before you eat breakfast. ? Wear the same amount of clothing each time. ? Write down your daily weight. Give your record to your doctor.  Check and write down your blood pressure as told by your doctor.  Check your pulse as told by your doctor. Dealing with very hot and very cold weather  If it is very hot: ? Avoid activities that take a lot of energy. ? Use air conditioning or fans, or find a cooler place. ? Avoid caffeine and alcohol. ? Wear clothing that is loose-fitting, lightweight, and light-colored.  If it is very  cold: ? Avoid activities that take a lot of energy. ? Layer your clothes. ? Wear mittens or gloves, a hat, and a scarf when you go outside. ? Avoid alcohol. Follow these instructions at home:  Stay up to date with shots (vaccines). Get pneumococcal and flu (influenza) shots.  Keep all follow-up visits as told by your doctor. This is important. Contact a doctor if:  You gain weight quickly.  You have increasing shortness of breath.  You cannot do your normal activities.  You get tired easily.  You cough a lot.  You don't feel like eating or feel like you may vomit (nauseous).  You become puffy (swell) in your hands, feet, ankles, or belly (abdomen).  You cannot sleep well because it is hard to breathe.  You feel like your heart is beating fast (palpitations).  You get dizzy when you stand up. Get help right away if:  You have trouble breathing.  You or someone else notices a change in your behavior, such as having trouble staying awake.  You have chest pain or discomfort.  You pass out (faint). These symptoms may be an emergency. Do not wait to see if the symptoms will go away. Get medical help right away. Call your local emergency services (911 in the [place] Do not drive yourself to the hospital. Summary  Heart failure is a serious condition. To care for yourself, you may have to change your diet, take medicines, and make other lifestyle changes.  Take your medicines every day. Do not stop taking them unless your doctor tells you to do so.  Eat heart-healthy foods, such as fresh or frozen fruits and vegetables, fish, lean meats, legumes, fat-free or low-fat dairy products, and whole-grain or high-fiber foods.  Ask your doctor if you can drink alcohol. You may have to stop alcohol use if you have very bad heart failure.  Contact your doctor if you gain weight quickly or feel that your heart is beating too fast. Get help right away if you pass out, or have chest pain  or trouble breathing. This information is not intended to replace advice given to you by your health care provider. Make sure you discuss any questions you have with your health care provider. Document Revised: 07/20/2018 Document Reviewed: 07/21/2018 Elsevier Patient Education  2020 [employer]  

## 2019-05-24 NOTE — Progress Notes (Signed)
BP 128/74 (BP Location: Left Arm, Patient Position: Sitting)   Pulse 68   Temp 97.7 F (36.5 C) (Oral)   SpO2 98%    Subjective:    Patient ID: Philip Wood, male    DOB: 1945-05-15, 74 y.o.   MRN: [id number]  HPI: BRAINARD Wood is a 74 y.o. male  Chief Complaint  Patient presents with  . Diabetes    no recent eye exam   . Hyperlipidemia  . Hypertension   DIABETES Continues on Metformin 500 MG BID.  Last A1C was 5.9% in July. Hypoglycemic episodes:no Polydipsia/polyuria: no Visual disturbance: no Chest pain: no Paresthesias: no Glucose Monitoring: no  Accucheck frequency: Not Checking  Fasting glucose:  Post prandial:  Evening:  Before meals: Taking Insulin?: no  Long acting insulin:  Short acting insulin: Blood Pressure Monitoring: a few times a week Retinal Examination: Not up to Date Foot Exam: Up to Date Pneumovax: Up to Date Influenza: Up to Date Aspirin: yes   HYPERTENSION / HYPERLIPIDEMIA/HF Continues Ramipril, Metoprolol, and Atorvastatin + ASA.  Is followed by cardiology and last saw in July 2020, [place] Philip Wood.  Last echo EF 45-50% in 2019.  Satisfied with current treatment? yes Duration of hypertension: chronic BP monitoring frequency: a few times a week BP range: 139/82 yesterday -- 118-120/ 70's on average BP medication side effects: no Duration of hyperlipidemia: chronic Cholesterol medication side effects: no Cholesterol supplements: none Medication compliance: good compliance Aspirin: yes Recent stressors: no Recurrent headaches: no Visual changes: no Palpitations: no Dyspnea: baseline, no worse Chest pain: no Lower extremity edema: no Dizzy/lightheaded: no   GOUT Continues on Allopurinol, with last flare 2-3 years ago.  Has not had flare since being on Allopurinol. Duration:chronic Swelling: no Redness: no Trauma: no Recent dietary change or indiscretion: no Fevers: no Nausea/vomiting: no Status:  stable Treatments  attempted: Allopurinol  LUNG CANCER (Right upper lobe nodule ): Followed by oncology and received radiation treatment.  Last saw Dr. Baruch Gouty on 05/18/2019.  Goes back for CT scan in April.  Smokes more than 1/2 PPD, he tried to quit but was unsuccessful.  Is not interested in quitting at this time.  Last followed by pulmonary in 2019, Dr. Mortimer Fries, for COPD.  Relevant past medical, surgical, family and social history reviewed and updated as indicated. Interim medical history since our last visit reviewed. Allergies and medications reviewed and updated.  Review of Systems  Constitutional: Negative for activity change, diaphoresis, fatigue and fever.  Respiratory: Negative for cough, chest tightness, shortness of breath and wheezing.   Cardiovascular: Negative for chest pain, palpitations and leg swelling.  Gastrointestinal: Negative.   Endocrine: Negative for cold intolerance, heat intolerance, polydipsia, polyphagia and polyuria.  Neurological: Negative.   Psychiatric/Behavioral: Negative.     Per HPI unless specifically indicated above     Objective:    BP 128/74 (BP Location: Left Arm, Patient Position: Sitting)   Pulse 68   Temp 97.7 F (36.5 C) (Oral)   SpO2 98%   Wt Readings from Last 3 Encounters:  05/18/19 (P) 181 lb 8 oz (82.3 kg)  03/15/19 181 lb 12.8 oz (82.5 kg)  02/28/19 184 lb 3.2 oz (83.6 kg)    Physical Exam Vitals and nursing note reviewed.  Constitutional:      General: He is awake. He is not in acute distress.    Appearance: He is well-developed and well-groomed. He is not ill-appearing.  HENT:     Head: Normocephalic and atraumatic.  Right Ear: Hearing normal. No drainage.     Left Ear: Hearing normal. No drainage.  Eyes:     General: Lids are normal.        Right eye: No discharge.        Left eye: No discharge.     Conjunctiva/sclera: Conjunctivae normal.     Pupils: Pupils are equal, round, and reactive to light.  Cardiovascular:     Rate and  Rhythm: Normal rate and regular rhythm.     Heart sounds: Normal heart sounds, S1 normal and S2 normal. No murmur. No gallop.   Pulmonary:     Effort: Pulmonary effort is normal. No accessory muscle usage or respiratory distress.     Breath sounds: Normal breath sounds.  Abdominal:     General: Bowel sounds are normal.     Palpations: Abdomen is soft.  Musculoskeletal:        General: Normal range of motion.     Cervical back: Normal range of motion and neck supple.     Right lower leg: No edema.     Left lower leg: No edema.  Skin:    General: Skin is warm and dry.  Neurological:     Mental Status: He is alert and oriented to person, place, and time.  Psychiatric:        Mood and Affect: Mood normal.        Behavior: Behavior normal. Behavior is cooperative.        Thought Content: Thought content normal.        Judgment: Judgment normal.    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  Home BP at goal.  Continue current medication regimen and collaboration with cardiology.  CMP today.      Relevant Medications   ramipril (ALTACE) 10 MG capsule   metoprolol succinate (TOPROL XL) 25 MG 24 hr tablet   atorvastatin (LIPITOR) 80 MG tablet   Other Relevant Orders   Bayer DCA Hb A1c Waived    Microalbumin, Urine Waived   Chronic systolic heart failure (HCC)    Chronic, ongoing. Continue current medication regimen and collaboration with cardiology.  CMP today.  Recommend: - Reminded to call for an overnight weight gain of >2 pounds or a weekly weight weight of >5 pounds - not adding salt to his food and has been reading food labels. Reviewed the importance of keeping daily sodium intake to 2000mg  daily       Relevant Medications   ramipril (ALTACE) 10 MG capsule   metoprolol succinate (TOPROL XL) 25 MG 24 hr tablet   atorvastatin (LIPITOR) 80 MG tablet     Respiratory   COPD (chronic obstructive pulmonary disease) (HCC)    Chronic, ongoing.  Continue current medication regimen.  Recommend complete smoking cessation.  Consider addition of Albuterol at next visit as rescue inhaler.  At this time he denies symptoms.      Lung cancer [facility])    Continue collaboration with oncology.  Recommend complete cessation of smoking.      Relevant Medications   allopurinol (ZYLOPRIM) 100 MG tablet     Endocrine   Diabetes mellitus with chronic kidney disease ([place]) - Primary    Chronic, ongoing.  A1C 5.7% today.  Urine ALB 80 and A:C 30-300, continue Ramipril for kidney protection.  Continue current medication regimen and adjust as needed.  Recommend he check BS at home at least a few mornings a week.  Consider reduction of medication next visit, if remains stable.  Return in 3 months.      Relevant Medications   ramipril (ALTACE) 10 MG capsule   metFORMIN (GLUCOPHAGE) 500 MG tablet   atorvastatin (LIPITOR) 80 MG tablet   Other Relevant Orders   Bayer DCA Hb A1c Waived   Microalbumin, Urine Waived   Hyperlipidemia associated with type 2 diabetes mellitus (HCC)    Chronic, ongoing.  Continue current medication regimen and adjust as needed.  Lipid panel today.      Relevant Medications   ramipril (ALTACE) 10 MG capsule   metFORMIN (GLUCOPHAGE) 500 MG tablet   atorvastatin (LIPITOR)  80 MG tablet   Other Relevant Orders   Bayer DCA Hb A1c Waived   Comprehensive metabolic panel   Lipid Panel w/o Chol/HDL Ratio     Nervous and Auditory   Nicotine dependence, cigarettes, w unsp disorders    I have recommended complete cessation of tobacco use. I have discussed various options available for assistance with tobacco cessation including over the counter methods (Nicotine gum, patch and lozenges). We also discussed prescription options (Chantix, Nicotine Inhaler / Nasal Spray). The patient is not interested in pursuing any prescription tobacco cessation options at this time.         Other   Gout    Chronic, stable with Allopurinol.  Continue current medication regimen and adjust as needed.  Uric acid level today.      Relevant Orders   Uric acid       Follow up plan: Return in about 6 months (around 11/21/2019) for T2DM, HTN/HLD, HF.

## 2019-05-24 NOTE — Assessment & Plan Note (Signed)
Chronic, ongoing. Continue current medication regimen and collaboration with cardiology.  CMP today.  Recommend: - Reminded to call for an overnight weight gain of >2 pounds or a weekly weight weight of >5 pounds - not adding salt to his food and has been reading food labels. Reviewed the importance of keeping daily sodium intake to 2000mg  daily

## 2019-05-24 NOTE — Assessment & Plan Note (Signed)
Chronic, ongoing.  Continue current medication regimen and adjust as needed. Lipid panel today. 

## 2019-05-24 NOTE — Assessment & Plan Note (Signed)
Chronic, stable with Allopurinol.  Continue current medication regimen and adjust as needed.  Uric acid level today.

## 2019-05-25 ENCOUNTER — Encounter: Payer: Self-pay | Admitting: Nurse Practitioner

## 2019-05-25 DIAGNOSIS — E870 Hyperosmolality and hypernatremia: Secondary | ICD-10-CM | POA: Insufficient documentation

## 2019-05-25 LAB — COMPREHENSIVE METABOLIC PANEL
ALT: 11 IU/L (ref 0–44)
AST: 16 IU/L (ref 0–40)
Albumin/Globulin Ratio: 2.4 — ABNORMAL HIGH (ref 1.2–2.2)
Albumin: 4 g/dL (ref 3.7–4.7)
Alkaline Phosphatase: 124 IU/L — ABNORMAL HIGH (ref 39–117)
BUN/Creatinine Ratio: 10 (ref 10–24)
BUN: 10 mg/dL (ref 8–27)
Bilirubin Total: 0.4 mg/dL (ref 0.0–1.2)
CO2: 26 mmol/L (ref 20–29)
Calcium: 8.6 mg/dL (ref 8.6–10.2)
Chloride: 105 mmol/L (ref 96–106)
Creatinine, Ser: 0.97 mg/dL (ref 0.76–1.27)
GFR calc Af Amer: 89 mL/min/{1.73_m2} (ref >59)
GFR calc non Af Amer: 77 mL/min/{1.73_m2} (ref >59)
Globulin, Total: 1.7 g/dL (ref 1.5–4.5)
Glucose: 99 mg/dL (ref 65–99)
Potassium: 4.8 mmol/L (ref 3.5–5.2)
Sodium: 145 mmol/L — ABNORMAL HIGH (ref 134–144)
Total Protein: 5.7 g/dL — ABNORMAL LOW (ref 6.0–8.5)

## 2019-05-25 LAB — LIPID PANEL W/O CHOL/HDL RATIO
Cholesterol, Total: 136 mg/dL (ref 100–199)
HDL: 34 mg/dL — ABNORMAL LOW (ref >39)
LDL Chol Calc (NIH): 86 mg/dL (ref 0–99)
Triglycerides: 83 mg/dL (ref 0–149)
VLDL Cholesterol Cal: 16 mg/dL (ref 5–40)

## 2019-05-25 LAB — URIC ACID: Uric Acid: 4.8 mg/dL (ref 3.8–8.4)

## 2019-05-25 NOTE — Progress Notes (Signed)
Good afternoon.  Please let [name] know overall labs show normal kidney and liver function + normal uric acid.  Sodium level slightly elevated, will recheck this next visit.  Recommend he drink more water during daytime hours and decrease salt intake.  Cholesterol levels look good.  If any questions or concerns please let me know.  Have a great day!!

## 2019-05-30 ENCOUNTER — Other Ambulatory Visit: Payer: Self-pay | Admitting: Nurse Practitioner

## 2019-06-20 NOTE — Progress Notes (Signed)
Cardiology Office Note    Date:  06/21/2019   ID:  Philip Wood, DOB [birth year], MRN [id number]  PCP:  Venita Lick, NP  Cardiologist:  Kathlyn Sacramento, MD  Electrophysiologist:  None   Chief Complaint: Follow up  History of Present Illness:   Philip Wood is a 74 y.o. 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  He was initially evaluated by Dr. Fletcher Anon on 08/21/2017 for pre-procedural cardiac evaluation prior to having a bronchoscopy secondary to pulmonary nodules. He previously had a negative work up by outside cardiology group in the remote past for atypical chest pain. He noted intermittent episodes of left-sided chest pain described as sharp as well as stable exertional dyspnea. He underwent Lexiscan Myoview in 08/2017, for further risk stratification given his risk factors, that showed a small in size, severe, fixed defect at the apex that most likely represented artifact and less likely scar. No significant ischemia was noted. The LVEF was moderately decreased at 30-44%. This was read as an intermediate risk study due to the moderately reduced LVEF with recommendation to correlate with an echo. He underwent TTE in 08/2017, that showed an EF of 45-50%, diffuse HK, Gr2DD, mild MR, RVSF normal, mild to moderate TR, PASP 44 mmHg. He underwent carotid artery ultrasound for bruit noted on exam which showed bilateral 1 to 39% internal carotid artery stenoses with antegrade bilateral vertebral artery flow and normal flow hemodynamics in the bilateral subclavian arteries.  He subsequently underwent bronchoscopy with biopsy which was suspicious for cancer and was treated with radiation therapy. He was most recently seen virtually in 10/2018, and was doing  well. Most recent PET scan from 02/2019 showed an enlarged hypermetabolic pulmonary nodule in the right upper lobe consistent with metastatic disease or new primary disease.   He comes in doing well from a cardiac perspective.  He does note randomly occurring upper left anterior chest wall discomfort that will last for less than 1 minute with spontaneous resolution.  No dyspnea, palpitations, dizziness, presyncope, syncope.  No falls, hematochezia, or melena.  He indicates his BP at home is typically in the 120s over 70s.  He has had a rare reading in the 90s over 60s after washing his car in the setting of dehydration.   Labs independently reviewed: 05/2019 - TC 136, TG 83, HDL 34, LDL 86, BUN 10, SCr 0.97, potassium 4.8, AST/ALT normal, albumin 4.0, A1c 5.7 02/2019 - HGB 15.4, PLT 164  Past Medical History:  Diagnosis Date  . Cancer ([place])    laryngeal  . COPD (chronic obstructive pulmonary disease) ([place])   . Diabetes mellitus without complication ([place])   . GERD (gastroesophageal reflux disease)   . Hyperlipidemia   . Hypertension   . Lumbago   . Neuropathy   . Tobacco abuse disorder     Past Surgical History:  Procedure Laterality Date  . CHOLECYSTECTOMY    . ELECTROMAGNETIC NAVIGATION BROCHOSCOPY N/A 08/19/2017   Procedure: ELECTROMAGNETIC NAVIGATION BRONCHOSCOPY;  Surgeon: Flora Lipps, MD;  Location: [facility] ORS;  Service: Cardiopulmonary;  Laterality: N/A;  . ELECTROMAGNETIC NAVIGATION BROCHOSCOPY Left 09/02/2017   Procedure: ELECTROMAGNETIC NAVIGATION BRONCHOSCOPY;  Surgeon: Flora Lipps, MD;  Location: [facility] ORS;  Service: Cardiopulmonary;  Laterality: Left;  . THROAT SURGERY  Current Medications: Current Meds  Medication Sig  . allopurinol (ZYLOPRIM) 100 MG tablet Take 1 tablet (100 mg total) by mouth daily.  Marland Kitchen aspirin 81 MG tablet Take 81 mg by mouth daily.  Marland Kitchen atorvastatin (LIPITOR) 80 MG tablet Take 1 tablet (80 mg total) by mouth daily at 6 PM.  . metFORMIN (GLUCOPHAGE)  500 MG tablet Take 1 tablet (500 mg total) by mouth 2 (two) times daily with a meal.  . metoprolol succinate (TOPROL XL) 25 MG 24 hr tablet Take 1 tablet (25 mg total) by mouth daily.  . ramipril (ALTACE) 10 MG capsule Take 1 capsule (10 mg total) by mouth daily.  Marland Kitchen tiotropium (SPIRIVA HANDIHALER) 18 MCG inhalation capsule Place 1 capsule (18 mcg total) into inhaler and inhale daily.    Allergies:   Patient has no known allergies.   Social History   Socioeconomic History  . Marital status: Married    Spouse name: Not on file  . Number of children: Not on file  . Years of education: Not on file  . Highest education level: 10th grade  Occupational History  . Not on file  Tobacco Use  . Smoking status: Light Tobacco Smoker    Packs/day: 0.60    Years: 53.00    Pack years: 31.80    Types: Cigarettes  . Smokeless tobacco: Never Used  . Tobacco comment: Aprrox 3-4 cigs/day  Substance and Sexual Activity  . Alcohol use: No    Alcohol/week: 0.0 standard drinks  . Drug use: No  . Sexual activity: Not Currently  Other Topics Concern  . Not on file  Social History Narrative   Works part time at [employer] Strain:   . Difficulty of Paying Living Expenses: Not on file  Food Insecurity:   . Worried About Charity fundraiser in the Last Year: Not on file  . Ran Out of Food in the Last Year: Not on file  Transportation Needs:   . Lack of Transportation (Medical): Not on file  . Lack of Transportation (Non-Medical): Not on file  Physical Activity:   . Days of Exercise per Week: Not on file  . Minutes of Exercise per Session: Not on file  Stress:   . Feeling of Stress : Not on file  Social Connections:   . Frequency of Communication with Friends and Family: Not on file  . Frequency of Social Gatherings with Friends and Family: Not on file  . Attends Religious Services: Not on file  . Active Member of Clubs or Organizations: Not on  file  . Attends Archivist Meetings: Not on file  . Marital Status: Not on file     Family History:  The patient's family history includes Brain cancer in his sister; Cervical cancer in his sister; Diabetes in his sister; Heart attack in his father; Heart disease in his father; Lung cancer in his brother, brother, brother, and brother; Lupus in his sister; Rectal cancer in his brother; Stomach cancer in his sister and sister.  ROS:   Review of Systems  Constitutional: Positive for malaise/fatigue. Negative for chills, diaphoresis, fever and weight loss.  HENT: Negative for congestion.   Eyes: Negative for discharge and redness.  Respiratory: Negative for cough, hemoptysis, sputum production, shortness of breath and wheezing.   Cardiovascular: Negative for chest pain, palpitations, orthopnea, claudication, leg swelling and PND.  Gastrointestinal: Negative for abdominal pain, blood in stool, heartburn, melena, nausea and  vomiting.  Genitourinary: Negative for hematuria.  Musculoskeletal: Negative for falls and myalgias.  Skin: Negative for rash.  Neurological: Positive for dizziness. Negative for tingling, tremors, sensory change, speech change, focal weakness, loss of consciousness and weakness.  Endo/Heme/Allergies: Does not bruise/bleed easily.  Psychiatric/Behavioral: Negative for substance abuse. The patient is not nervous/anxious.   All other systems reviewed and are negative.    EKGs/Labs/Other Studies Reviewed:    Studies reviewed were summarized above. The additional studies were reviewed today:  2D echo 08/2017: - Left ventricle: The cavity size was normal. Systolic function was  mildly reduced. The estimated ejection fraction was in the range  of 45% to 50%. Diffuse hypokinesis. Regional wall motion  abnormalities cannot be excluded. Features are consistent with a  pseudonormal left ventricular filling pattern, with concomitant  abnormal relaxation and  increased filling pressure (grade 2  diastolic dysfunction).  - Mitral valve: There was mild regurgitation.  - Right ventricle: Systolic function was normal.  - Tricuspid valve: There was mild-moderate regurgitation.  - Pulmonary arteries: Systolic pressure was mildly elevated. PA  peak pressure: 44 mm Hg (S).  __________  Carlton Adam MPI 08/2017:  Abnormal pharmacologic myocardial perfusion stress test.  There is a small in size, severe, fixed defect at the apex that most likely represents artifact (attenuation and/or apical thinning) and less likely scar.  No significant ischemia is observed.  The left ventricular ejection fraction is moderately decreased (30-44%) with diffuse hypokinesis.  This is an intermediate risk study due to moderately reduced LVEF - echo correlation may be helpful.  The sensitivity and specificity of the study are degraded by significant extracardiac activity and attenuation artifact.   EKG:  EKG is ordered today.  The EKG ordered today demonstrates NSR, occasional PVCs with noted ventricular bigeminy 83 bpm, no acute ST-T changes  Recent Labs: 02/28/2019: Hemoglobin 15.4; Platelets 164 05/24/2019: ALT 11; BUN 10; Creatinine, Ser 0.97; Potassium 4.8; Sodium 145  Recent Lipid Panel    Component Value Date/Time   CHOL 136 05/24/2019 0941   CHOL 149 08/12/2018 0838   TRIG 83 05/24/2019 0941   TRIG 111 08/12/2018 0838   HDL 34 (L) 05/24/2019 0941   VLDL 22 08/12/2018 0838   LDLCALC 86 05/24/2019 0941    PHYSICAL EXAM:    VS:  BP (!) 150/80 (BP Location: Left Arm, Patient Position: Sitting, Cuff Size: Normal)   Pulse 83   Ht 5\' 10"  (1.778 m)   Wt 181 lb (82.1 kg)   SpO2 97%   BMI 25.97 kg/m   BMI: Body mass index is 25.97 kg/m.  Physical Exam  Constitutional: He is oriented to person, place, and time. He appears well-developed and well-nourished.  HENT:  Head: Normocephalic and atraumatic.  Eyes: Right eye exhibits no discharge. Left eye  exhibits no discharge.  Neck: No JVD present.  Cardiovascular: Normal rate, regular rhythm, S1 normal, S2 normal and normal heart sounds. Exam reveals no distant heart sounds, no friction rub, no midsystolic click and no opening snap.  No murmur heard. Pulses:      Posterior tibial pulses are 2+ on the right side and 2+ on the left side.  Pulmonary/Chest: Effort normal and breath sounds normal. No respiratory distress. He has no decreased breath sounds. He has no wheezes. He has no rales. He exhibits no tenderness.  Abdominal: Soft. He exhibits no distension. There is no abdominal tenderness.  Musculoskeletal:        General: No edema.     Cervical  back: Normal range of motion.  Neurological: He is alert and oriented to person, place, and time.  Skin: Skin is warm and dry. No cyanosis. Nails show no clubbing.  Psychiatric: He has a normal mood and affect. His speech is normal and behavior is normal. Judgment and thought content normal.    Wt Readings from Last 3 Encounters:  06/21/19 181 lb (82.1 kg)  05/18/19 (P) 181 lb 8 oz (82.3 kg)  03/15/19 181 lb 12.8 oz (82.5 kg)     ASSESSMENT & PLAN:   1. HFrEF secondary to NICM: He appears euvolemic and well compensated.  Most recent EF of 45 to 50% as outlined above.  He is not requiring a standing diuretic.  His atypical left-sided chest pain is not consistent with ischemia with prior nonischemic MPI as outlined above.  Continue current therapy with Toprol-XL and ramipril.  2. HLD: LDL of 86 from 05/2019.  Remains on atorvastatin 80 mg daily.  3. HTN: Blood pressure is mildly elevated in the office today though patient states this is typical for him.  BP at home has been in the 120s over 70s predominantly with a rare reading in the 16X systolic.  Given this, we will not make any changes to his antihypertensive therapy and he will remain on current doses of ramipril and Toprol-XL.  4. PAD: Stable without symptoms of lifestyle limiting  claudication.  Followed by vascular surgery.  Remains on aspirin and Lipitor.   Disposition: F/u with Dr. Fletcher Anon or an APP in 6 months.   Medication Adjustments/Labs and Tests Ordered: Current medicines are reviewed at length with the patient today.  Concerns regarding medicines are outlined above. Medication changes, Labs and Tests ordered today are summarized above and listed in the Patient Instructions accessible in Encounters.   Signed, Christell Faith, PA-C 06/21/2019 9:25 AM     [place] [address] [place] [address] [place], [place] [address] [phone]

## 2019-06-21 ENCOUNTER — Other Ambulatory Visit: Payer: Self-pay

## 2019-06-21 ENCOUNTER — Encounter: Payer: Self-pay | Admitting: Physician Assistant

## 2019-06-21 ENCOUNTER — Ambulatory Visit (INDEPENDENT_AMBULATORY_CARE_PROVIDER_SITE_OTHER): Payer: Medicare Other | Admitting: Physician Assistant

## 2019-06-21 VITALS — BP 150/80 | HR 83 | Ht 70.0 in | Wt 181.0 lb

## 2019-06-21 DIAGNOSIS — I739 Peripheral vascular disease, unspecified: Secondary | ICD-10-CM | POA: Diagnosis not present

## 2019-06-21 DIAGNOSIS — I428 Other cardiomyopathies: Secondary | ICD-10-CM | POA: Diagnosis not present

## 2019-06-21 DIAGNOSIS — I10 Essential (primary) hypertension: Secondary | ICD-10-CM | POA: Diagnosis not present

## 2019-06-21 DIAGNOSIS — I5022 Chronic systolic (congestive) heart failure: Secondary | ICD-10-CM

## 2019-06-21 DIAGNOSIS — E785 Hyperlipidemia, unspecified: Secondary | ICD-10-CM

## 2019-06-21 NOTE — Patient Instructions (Addendum)
  Medication Instructions:  Your physician recommends that you continue on your current medications as directed. Please refer to the Current Medication list given to you today.  *If you need a refill on your cardiac medications before your next appointment, please call your pharmacy*   Lab Work: None If you have labs (blood work) drawn today and your tests are completely normal, you will receive your results only by: Marland Kitchen MyChart Message (if you have MyChart) OR . A paper copy in the mail If you have any lab test that is abnormal or we need to change your treatment, we will call you to review the results.   Testing/Procedures: None   Follow-Up: At [facility], you and your health needs are our priority.  As part of our continuing mission to provide you with exceptional heart care, we have created designated Provider Care Teams.  These Care Teams include your primary Cardiologist (physician) and Advanced Practice Providers (APPs -  Physician Assistants and Nurse Practitioners) who all work together to provide you with the care you need, when you need it.  We recommend signing up for the patient portal called "MyChart".  Sign up information is provided on this After Visit Summary.  MyChart is used to connect with patients for Virtual Visits (Telemedicine).  Patients are able to view lab/test results, encounter notes, upcoming appointments, etc.  Non-urgent messages can be sent to your provider as well.   To learn more about what you can do with MyChart, go to [email or web address].    Your next appointment:   6 month(s)  The format for your next appointment:   In Person  Provider:   Kathlyn Sacramento, MD or APP   Other Instructions

## 2019-07-01 ENCOUNTER — Encounter: Payer: Self-pay | Admitting: Nurse Practitioner

## 2019-07-11 ENCOUNTER — Other Ambulatory Visit: Payer: Self-pay

## 2019-07-11 DIAGNOSIS — C3490 Malignant neoplasm of unspecified part of unspecified bronchus or lung: Secondary | ICD-10-CM

## 2019-07-11 DIAGNOSIS — R911 Solitary pulmonary nodule: Secondary | ICD-10-CM

## 2019-07-12 ENCOUNTER — Inpatient Hospital Stay: Payer: Medicare Other | Attending: Oncology

## 2019-07-12 ENCOUNTER — Inpatient Hospital Stay (HOSPITAL_BASED_OUTPATIENT_CLINIC_OR_DEPARTMENT_OTHER): Payer: Medicare Other | Admitting: Oncology

## 2019-07-12 ENCOUNTER — Encounter: Payer: Self-pay | Admitting: Oncology

## 2019-07-12 ENCOUNTER — Other Ambulatory Visit: Payer: Self-pay

## 2019-07-12 VITALS — BP 150/79 | HR 82 | Temp 95.8°F | Wt 181.1 lb

## 2019-07-12 DIAGNOSIS — C3490 Malignant neoplasm of unspecified part of unspecified bronchus or lung: Secondary | ICD-10-CM

## 2019-07-12 DIAGNOSIS — I10 Essential (primary) hypertension: Secondary | ICD-10-CM | POA: Diagnosis not present

## 2019-07-12 DIAGNOSIS — Z8521 Personal history of malignant neoplasm of larynx: Secondary | ICD-10-CM | POA: Insufficient documentation

## 2019-07-12 DIAGNOSIS — C329 Malignant neoplasm of larynx, unspecified: Secondary | ICD-10-CM | POA: Diagnosis not present

## 2019-07-12 DIAGNOSIS — E119 Type 2 diabetes mellitus without complications: Secondary | ICD-10-CM | POA: Diagnosis not present

## 2019-07-12 DIAGNOSIS — R911 Solitary pulmonary nodule: Secondary | ICD-10-CM

## 2019-07-12 DIAGNOSIS — J449 Chronic obstructive pulmonary disease, unspecified: Secondary | ICD-10-CM | POA: Diagnosis not present

## 2019-07-12 DIAGNOSIS — Z720 Tobacco use: Secondary | ICD-10-CM | POA: Diagnosis not present

## 2019-07-12 DIAGNOSIS — F17210 Nicotine dependence, cigarettes, uncomplicated: Secondary | ICD-10-CM | POA: Insufficient documentation

## 2019-07-12 DIAGNOSIS — K219 Gastro-esophageal reflux disease without esophagitis: Secondary | ICD-10-CM | POA: Diagnosis not present

## 2019-07-12 LAB — COMPREHENSIVE METABOLIC PANEL
ALT: 17 U/L (ref 0–44)
AST: 20 U/L (ref 15–41)
Albumin: 3.4 g/dL — ABNORMAL LOW (ref 3.5–5.0)
Alkaline Phosphatase: 90 U/L (ref 38–126)
Anion gap: 8 (ref 5–15)
BUN: 16 mg/dL (ref 8–23)
CO2: 29 mmol/L (ref 22–32)
Calcium: 8.6 mg/dL — ABNORMAL LOW (ref 8.9–10.3)
Chloride: 102 mmol/L (ref 98–111)
Creatinine, Ser: 1.2 mg/dL (ref 0.61–1.24)
GFR calc Af Amer: >60 mL/min (ref >60)
GFR calc non Af Amer: 60 mL/min — ABNORMAL LOW (ref >60)
Glucose, Bld: 137 mg/dL — ABNORMAL HIGH (ref 70–99)
Potassium: 4.2 mmol/L (ref 3.5–5.1)
Sodium: 139 mmol/L (ref 135–145)
Total Bilirubin: 0.7 mg/dL (ref 0.3–1.2)
Total Protein: 6 g/dL — ABNORMAL LOW (ref 6.5–8.1)

## 2019-07-12 LAB — CBC WITH DIFFERENTIAL/PLATELET
Abs Immature Granulocytes: 0.01 10*3/uL (ref 0.00–0.07)
Basophils Absolute: 0 10*3/uL (ref 0.0–0.1)
Basophils Relative: 0 %
Eosinophils Absolute: 0.3 10*3/uL (ref 0.0–0.5)
Eosinophils Relative: 5 %
HCT: 42.2 % (ref 39.0–52.0)
Hemoglobin: 14.2 g/dL (ref 13.0–17.0)
Immature Granulocytes: 0 %
Lymphocytes Relative: 25 %
Lymphs Abs: 1.5 10*3/uL (ref 0.7–4.0)
MCH: 31.6 pg (ref 26.0–34.0)
MCHC: 33.6 g/dL (ref 30.0–36.0)
MCV: 94 fL (ref 80.0–100.0)
Monocytes Absolute: 0.7 10*3/uL (ref 0.1–1.0)
Monocytes Relative: 12 %
Neutro Abs: 3.3 10*3/uL (ref 1.7–7.7)
Neutrophils Relative %: 58 %
Platelets: 143 10*3/uL — ABNORMAL LOW (ref 150–400)
RBC: 4.49 MIL/uL (ref 4.22–5.81)
RDW: 13.3 % (ref 11.5–15.5)
WBC: 5.8 10*3/uL (ref 4.0–10.5)
nRBC: 0 % (ref 0.0–0.2)

## 2019-07-12 NOTE — Progress Notes (Signed)
Hematology/Oncology Follow up note [facility] [phone] [phone]   Patient Care Team: Venita Lick, NP as PCP - General (Nurse Practitioner) Wellington Hampshire, MD as PCP - Cardiology (Cardiology) Beverly Gust, MD (Otolaryngology) Telford Nab, RN as Registered Nurse  REFERRING PROVIDER: Venita Lick, NP REASON FOR VISIT Follow up for lung nodules and presumed lung cancer s/p SBRT  HISTORY OF PRESENTING ILLNESS:  he has history of 31 pack a day smoking history,  #Patient had a history of 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  He got definitive radiation to the larynx, and the patient has been doing annual checkup with ENT Dr. Tami Ribas.  # 09/02/2017 ENB biopsy of both lung nodule. Pathology of biopsies showed no lesional cells identified. However he is a high risk patient and negative biopsy results do not exclude malignancy.  May 2019 Patient underwent SBRT to the left lung lesion.  # 02/25/2019 CT scan showed right upper lobe nodule interval increase in size.  #03/2019 he finished SBRT to right upper lobe lesion.    INTERVAL HISTORY Philip Wood is a 74 y.o. male who has above history reviewed by me today presents for follow-up visit for management of lung nodule for presumed lung cancer He reports having right shoulder pain, radiate to his right arm. Pain lasted for a few days and has resolved. Today she has no pain.   Review of Systems  Constitutional: Negative for appetite change, chills, fatigue, fever and unexpected weight change.  HENT:   Negative for hearing loss and voice change.   Eyes: Negative for eye problems and icterus.  Respiratory: Negative for chest tightness, cough and shortness of breath.   Cardiovascular: Negative  for chest pain and leg swelling.  Gastrointestinal: Negative for abdominal distention and abdominal pain.  Endocrine: Negative for hot flashes.  Genitourinary: Negative for difficulty urinating, dysuria and frequency.   Musculoskeletal: Negative for arthralgias.       Right shoulder pain.  Skin: Negative for itching and rash.  Neurological: Negative for light-headedness and numbness.  Hematological: Negative for adenopathy. Does not bruise/bleed easily.  Psychiatric/Behavioral: Negative for confusion.      MEDICAL HISTORY:  Past Medical History:  Diagnosis Date  . Cancer ([place])    laryngeal  . COPD (chronic obstructive pulmonary disease) ([place])   . Diabetes mellitus without complication ([place])   . GERD (gastroesophageal reflux disease)   . Hyperlipidemia   . Hypertension   . Lumbago   . Neuropathy   . Tobacco abuse disorder     SURGICAL HISTORY: Past Surgical History:  Procedure Laterality Date  . CHOLECYSTECTOMY    . ELECTROMAGNETIC NAVIGATION BROCHOSCOPY N/A 08/19/2017   Procedure: ELECTROMAGNETIC NAVIGATION BRONCHOSCOPY;  Surgeon: Flora Lipps, MD;  Location: [facility] ORS;  Service: Cardiopulmonary;  Laterality: N/A;  . ELECTROMAGNETIC NAVIGATION BROCHOSCOPY Left 09/02/2017   Procedure: ELECTROMAGNETIC NAVIGATION BRONCHOSCOPY;  Surgeon: Flora Lipps, MD;  Location: [facility] ORS;  Service: Cardiopulmonary;  Laterality: Left;  . THROAT SURGERY      SOCIAL HISTORY: Social History   Socioeconomic History  . Marital status: Married    Spouse name: Not on file  . Number of children: Not on file  . Years of education: Not on file  . Highest education level: 10th grade  Occupational History  . Not on  file  Tobacco Use  . Smoking status: Light Tobacco Smoker    Packs/day: 0.60    Years: 53.00    Pack years: 31.80    Types: Cigarettes  . Smokeless tobacco: Never Used  . Tobacco comment: Aprrox 3-4 cigs/day  Substance and Sexual Activity  . Alcohol use: No    Alcohol/week: 0.0  standard drinks  . Drug use: No  . Sexual activity: Not Currently  Other Topics Concern  . Not on file  Social History Narrative   Works part time at [employer] Strain:   . Difficulty of Paying Living Expenses:   Food Insecurity:   . Worried About Charity fundraiser in the Last Year:   . Arboriculturist in the Last Year:   Transportation Needs:   . Film/video editor (Medical):   Marland Kitchen Lack of Transportation (Non-Medical):   Physical Activity:   . Days of Exercise per Week:   . Minutes of Exercise per Session:   Stress:   . Feeling of Stress :   Social Connections:   . Frequency of Communication with Friends and Family:   . Frequency of Social Gatherings with Friends and Family:   . Attends Religious Services:   . Active Member of Clubs or Organizations:   . Attends Archivist Meetings:   Marland Kitchen Marital Status:   Intimate Partner Violence:   . Fear of Current or Ex-Partner:   . Emotionally Abused:   Marland Kitchen Physically Abused:   . Sexually Abused:     FAMILY HISTORY: Family History  Problem Relation Age of Onset  . Heart disease Father   . Heart attack Father   . Brain cancer Sister   . Cervical cancer Sister   . Rectal cancer Brother   . Lung cancer Brother   . Lung cancer Brother   . Lung cancer Brother   . Stomach cancer Sister   . Lung cancer Brother   . Stomach cancer Sister   . Diabetes Sister   . Lupus Sister     ALLERGIES:  has No Known Allergies.  MEDICATIONS:  Current Outpatient Medications  Medication Sig Dispense Refill  . allopurinol (ZYLOPRIM) 100 MG tablet Take 1 tablet (100 mg total) by mouth daily. 90 tablet 2  . aspirin 81 MG tablet Take 81 mg by mouth daily.    Marland Kitchen atorvastatin (LIPITOR) 80 MG tablet Take 1 tablet (80 mg total) by mouth daily at 6 PM. 90 tablet 3  . metFORMIN (GLUCOPHAGE) 500 MG tablet Take 1 tablet (500 mg total) by mouth 2 (two) times daily with a meal. 180 tablet 1  .  metoprolol succinate (TOPROL XL) 25 MG 24 hr tablet Take 1 tablet (25 mg total) by mouth daily. 90 tablet 3  . ramipril (ALTACE) 10 MG capsule Take 1 capsule (10 mg total) by mouth daily. 90 capsule 3  . tiotropium (SPIRIVA HANDIHALER) 18 MCG inhalation capsule Place 1 capsule (18 mcg total) into inhaler and inhale daily. 90 capsule 3   No current facility-administered medications for this visit.     PHYSICAL EXAMINATION: ECOG PERFORMANCE STATUS: 1 - Symptomatic but completely ambulatory Vitals:   07/12/19 0952  BP: (!) 150/79  Pulse: 82  Temp: (!) 95.8 F (35.4 C)  SpO2: 96%   Filed Weights   07/12/19 0952  Weight: 181 lb 1.6 oz (82.1 kg)  Physical Exam  Constitutional: He is oriented to person, place, and  time. No distress.  HENT:  Head: Normocephalic and atraumatic.  Nose: Nose normal.  Mouth/Throat: Oropharynx is clear and moist. No oropharyngeal exudate.  Eyes: Pupils are equal, round, and reactive to light. EOM are normal. No scleral icterus.  Cardiovascular: Normal rate and regular rhythm.  No murmur heard. Pulmonary/Chest: Effort normal. No respiratory distress. He has no rales. He exhibits no tenderness.  Severely decreased breath sound  Abdominal: Soft. He exhibits no distension. There is no abdominal tenderness.  Musculoskeletal:        General: No edema. Normal range of motion.     Cervical back: Normal range of motion and neck supple.  Neurological: He is alert and oriented to person, place, and time. No cranial nerve deficit. He exhibits normal muscle tone. Coordination normal.  Skin: Skin is warm and dry. He is not diaphoretic. No erythema.  Psychiatric: Affect normal.          LABORATORY DATA:  I have reviewed the data as listed Lab Results  Component Value Date   WBC 5.8 07/12/2019   HGB 14.2 07/12/2019   HCT 42.2 07/12/2019   MCV 94.0 07/12/2019   PLT 143 (L) 07/12/2019   Recent Labs    02/28/19 0927 05/24/19 0941 07/12/19 0937  NA 139  145* 139  K 4.6 4.8 4.2  CL 105 105 102  CO2 27 26 29   GLUCOSE 108* 99 137*  BUN 16 10 16   CREATININE 1.24 0.97 1.20  CALCIUM 8.8* 8.6 8.6*  GFRNONAA 57* 77 60*  GFRAA >60 89 >60  PROT 6.4* 5.7* 6.0*  ALBUMIN 3.5 4.0 3.4*  AST 14* 16 20  ALT 11 11 17   ALKPHOS 89 124* 90  BILITOT 0.7 0.4 0.7   RADIOGRAPHIC STUDIES: I have personally reviewed the radiological images as listed and agreed with the findings in the report.  11/25/2017 CT chest wo 10 x 11 mm left lower lobe pulmonary nodule decreased.  10 x 14 mm right lower lobe with decreasing solid component.  As above.  Aortic atherosclerosis and emphysema.  03/01/2018 CT chest wo Slight interval enlargement of what is now a 12 x 15 mm mixed solid and sub solid nodule in the right upper lobe (axial image 61 of series 3). This remains concerning for potential slow-growing neoplasm such as an adenocarcinoma. 2. Previously noted left lower lobe nodule appears slightly smaller than the prior study, currently measuring 7 x 11 mm. 3. Mild diffuse bronchial wall thickening with mild to moderate centrilobular emphysema; imaging findings suggestive of underlying COPD. 4. Aortic atherosclerosis, in addition to left main and 3 vessel coronary artery disease. Assessment for potential risk factor modification, dietary therapy or pharmacologic therapy may be warranted, if clinically indicated. 5. There are calcifications of the aortic valve. Echocardiographic correlation for evaluation of potential valvular dysfunction may be warranted if clinically indicated.Aortic Atherosclerosis (ICD10-I70.0) and Emphysema ([id number]  ASSESSMENT & PLAN:  1. Solitary pulmonary nodule   2. Tobacco abuse   3. Squamous cell carcinoma of larynx (HCC)   Presumed lung cancer, status post SBRT of left lung nodule in 2019 and SBRP of right upper lobe nodule . #Dr.Chrystal has ordered follow up CT surveillance early next month.  His follow up plan depends on his CT scan  results.  Smoke cessation discussed.  # Squamous cell carcinoma of larynx, annual check up with ENT Dr.McQueen.   All questions were answered. The patient knows to call the clinic with any problems questions or concerns. We spent sufficient time  to discuss many aspect of care, questions were answered to patient's satisfaction.  Follow up in to be determined   Earlie Server, MD, PhD Hematology Oncology [facility] Pager- [phone] 07/12/2019

## 2019-07-12 NOTE — Progress Notes (Signed)
Over the past weekend patient had right shoulder pain that radiated to fingers for 3 days.

## 2019-07-26 ENCOUNTER — Ambulatory Visit
Admission: RE | Admit: 2019-07-26 | Discharge: 2019-07-26 | Disposition: A | Discharge status: Home / Self Care | Payer: Medicare Other | Source: Ambulatory Visit | Attending: Radiation Oncology | Admitting: Radiation Oncology

## 2019-07-26 ENCOUNTER — Other Ambulatory Visit: Payer: Self-pay

## 2019-07-26 DIAGNOSIS — R911 Solitary pulmonary nodule: Secondary | ICD-10-CM | POA: Diagnosis not present

## 2019-07-26 DIAGNOSIS — C3490 Malignant neoplasm of unspecified part of unspecified bronchus or lung: Secondary | ICD-10-CM | POA: Insufficient documentation

## 2019-07-26 DIAGNOSIS — J439 Emphysema, unspecified: Secondary | ICD-10-CM | POA: Diagnosis not present

## 2019-07-26 IMAGING — CT CT CHEST W/ CM
2 of 4 series · 15 of 36 positions shown, 18 images · IV contrast (omnipaque)
Comparison: PET-CT [DATE]. Chest CT [DATE]

CLINICAL DATA: Solitary pulmonary nodule. History of head neck
cancer. Prior SB RT to left lung nodule in [SK] and SBRP to right
upper lobe nodule.

EXAM:
CT CHEST WITH CONTRAST
TECHNIQUE: Multidetector CT imaging of the chest was performed during
intravenous contrast administration.
CONTRAST:  75mL OMNIPAQUE IOHEXOL 300 MG/ML  SOLN

[Series 2: axial chest 2.00 · axial · 0.65mm/px · z∈[-1215,-899]mm · 12 of 188 slices shown, 15 images]
[im 15/188  mediastinal]
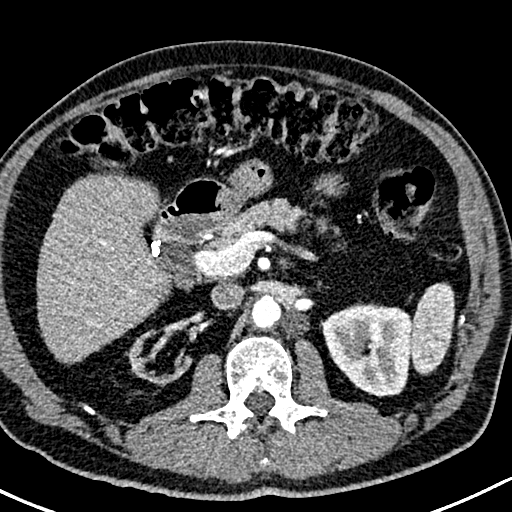
[im 15/188  lung]
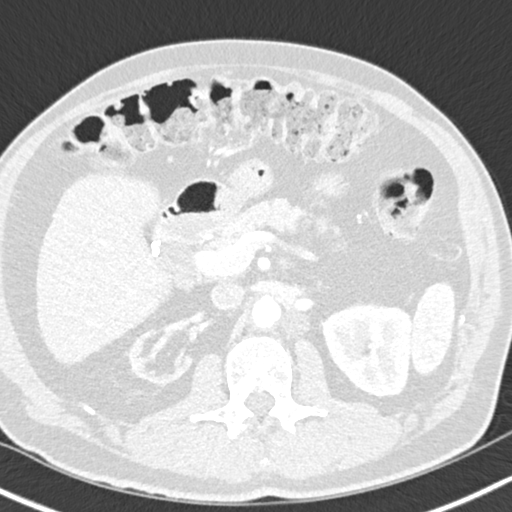
[im 29/188  lung]
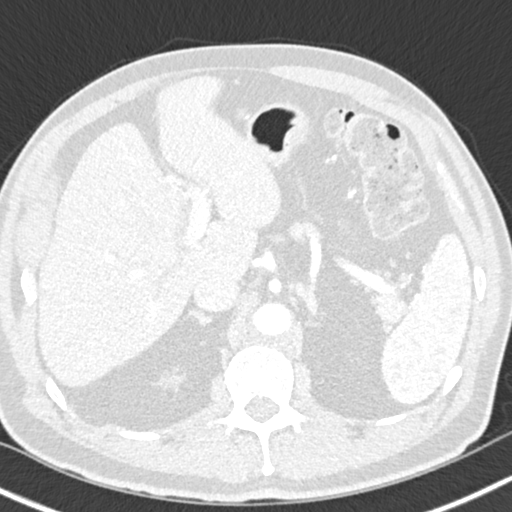
[im 44/188  lung]
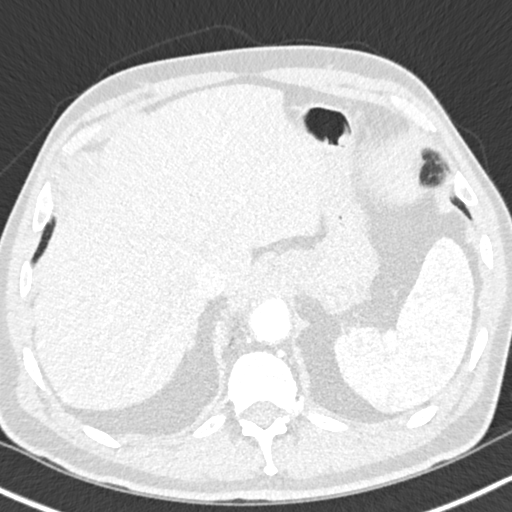
[im 58/188  lung]
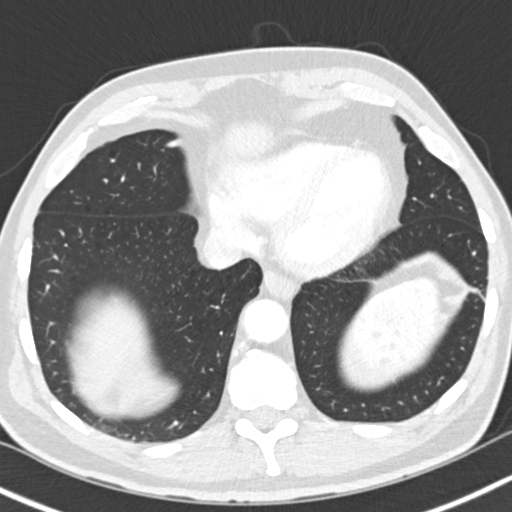
[im 72/188  mediastinal]
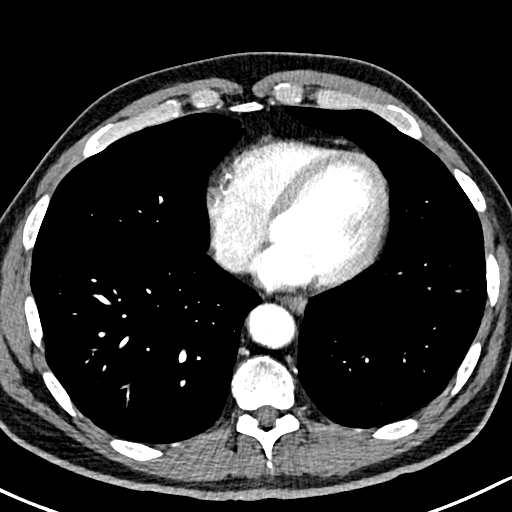
[im 72/188  lung]
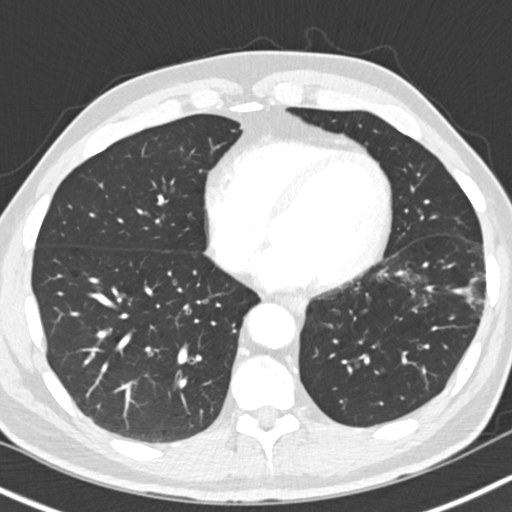
[im 87/188  lung]
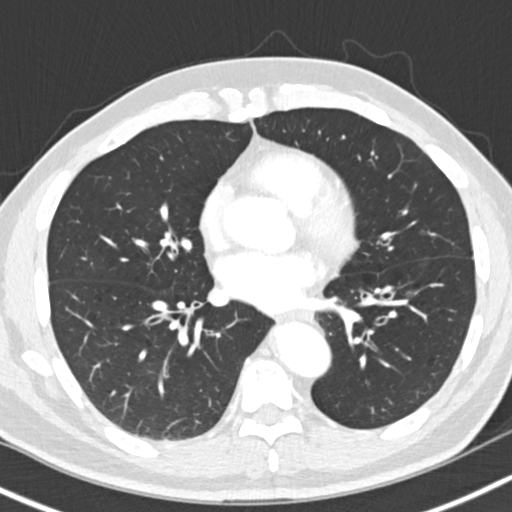
[im 101/188  lung]
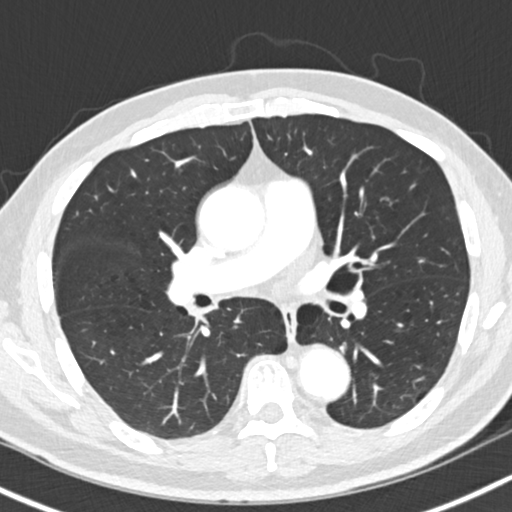
[im 116/188  lung]
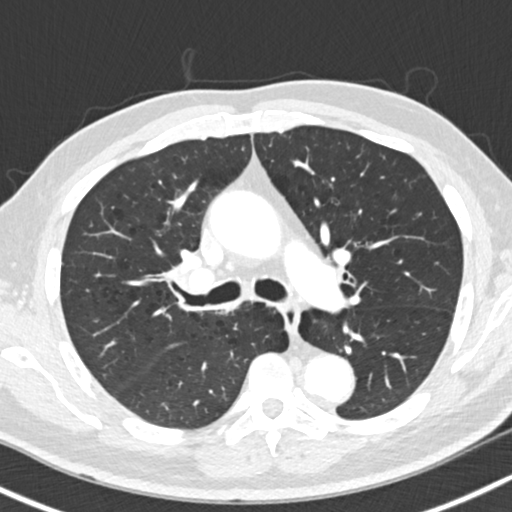
[im 130/188  mediastinal]
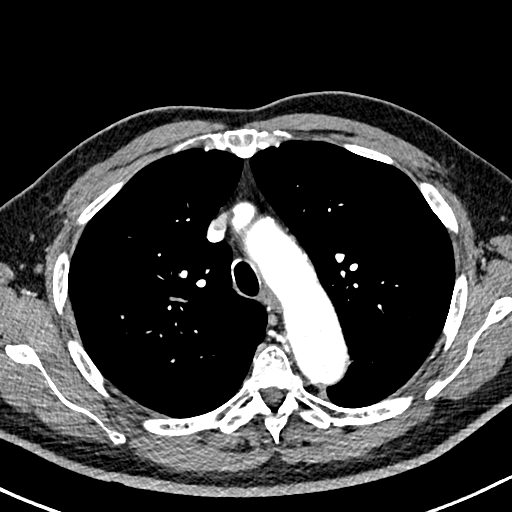
[im 130/188  lung]
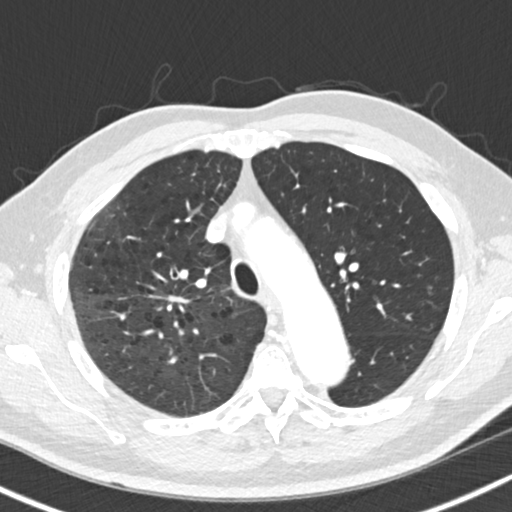
[im 144/188  lung]
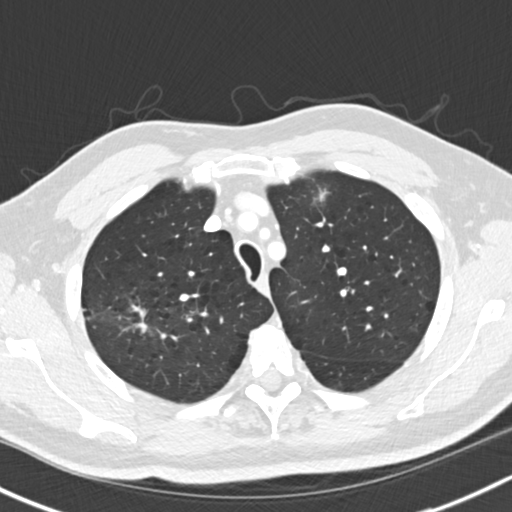
[im 159/188  lung]
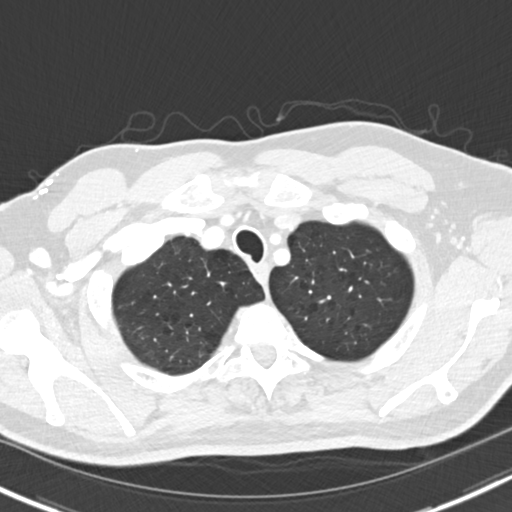
[im 173/188  lung]
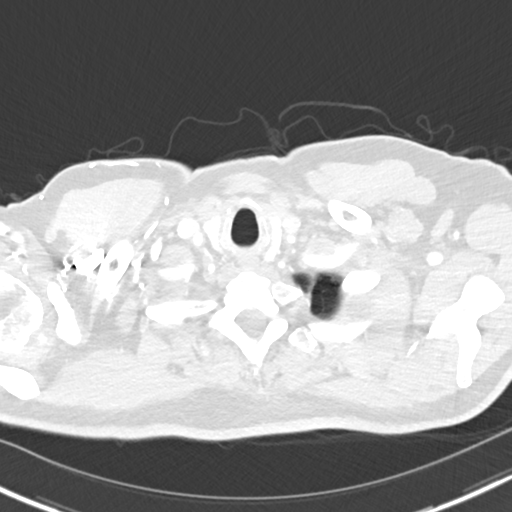

[Series 4: coronal chest 2.00 cor · coronal · 0.65mm/px · 3 of 160 slices shown]
[im 32/160  lung]
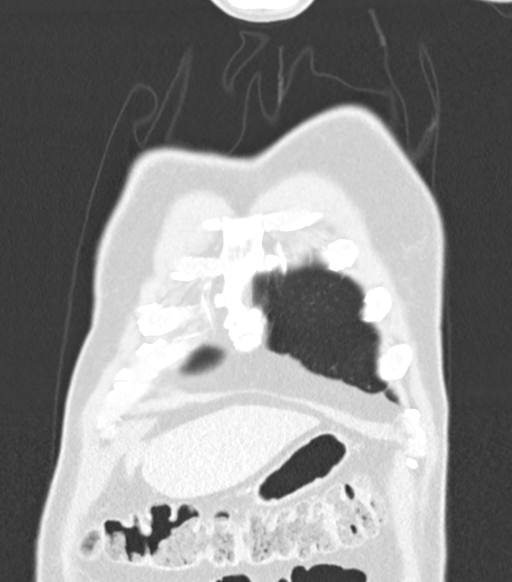
[im 64/160  lung]
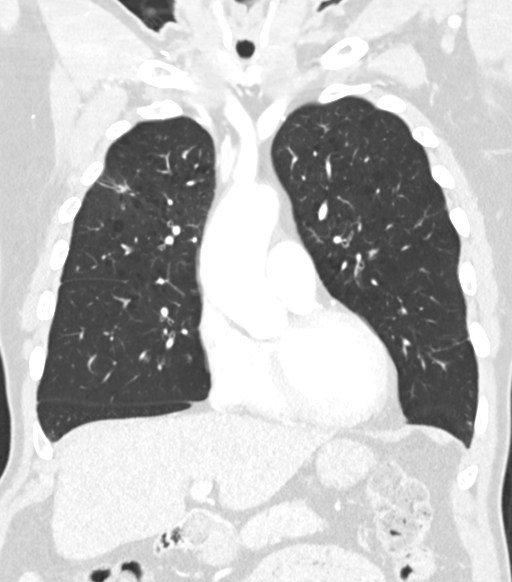
[im 96/160  lung]
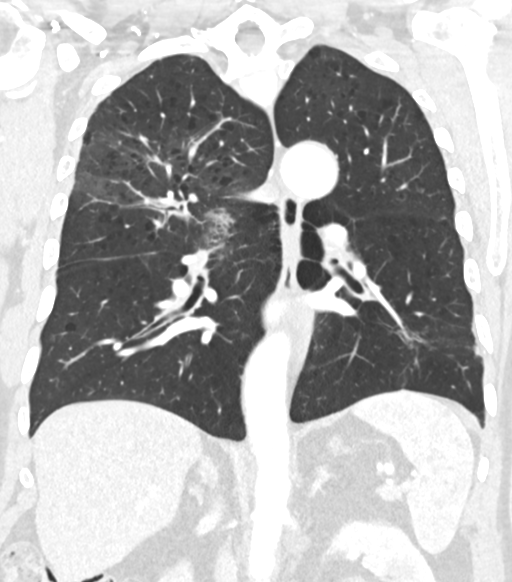

[15 of 36 positions shown; findings below may reference images not displayed]

FINDINGS: Cardiovascular: The heart size is normal. No substantial pericardial
effusion. Coronary artery calcification is evident. Atherosclerotic
calcification is noted in the wall of the thoracic aorta. Ascending
thoracic aorta measures 4.2 cm diameter.

Mediastinum/Nodes: No mediastinal lymphadenopathy. There is no hilar
lymphadenopathy. The esophagus has normal imaging features. There is
no axillary lymphadenopathy.

Lungs/Pleura: Centrilobular emphsyema noted.

The anterior right upper lobe cavitary lesion has decreased in size
in the interval measuring 1.5 cm today compared to 2.5 cm
previously. Architectural distortion around the lesion is compatible
with involving scarring from radiation.

The right suprahilar central right upper lobe nodule measures 1.5 x
0.8 cm today compared to 1.8 x 1.5 cm previously.

The 6 mm nodular density in the right lung apex measured previously
is 5 mm today (45/3). No new suspicious nodule or mass. Peripheral
scarring in the left lower lobe is similar to priors. No focal
airspace consolidation. No pleural effusion.

Upper Abdomen: Right kidney is atrophic. Innumerable tiny
hypodensities in the splenic parenchyma are similar to the most
recent CT scan with contrast dated [DATE]. These were not
hypermetabolic on previous PET imaging. 1.7 x 1.9 cm left
para-aortic nodular density is stable, also not hypermetabolic on
previous PET imaging.

Musculoskeletal: No worrisome lytic or sclerotic osseous
abnormality.
IMPRESSION: 1. Interval decrease in size of the anterior right upper lobe
cavitary lesion with surrounding scarring.
2. Interval decrease in size of the right suprahilar central right
upper lobe pulmonary nodule.
3. Stable 6 mm right apical pulmonary nodule.
4. Innumerable tiny hypodensities in the splenic parenchyma, stable
since [DATE] and without hypermetabolism on previous PET
imaging. Although features are nonspecific, these likely reflect
benign etiology.
5. Stable 1.9 cm left para-aortic nodule in the upper abdomen,
likely benign.
6. Aortic Atherosclerosis ([SK]-[SK]) and Emphysema ([SK]-[SK]).

## 2019-07-26 MED ORDER — IOHEXOL 300 MG/ML  SOLN
75.0000 mL | Freq: Once | INTRAMUSCULAR | Status: AC | PRN
  Administered 2019-07-26: 75 mL via INTRAVENOUS

## 2019-07-27 ENCOUNTER — Telehealth: Payer: Self-pay | Admitting: *Deleted

## 2019-07-27 DIAGNOSIS — R911 Solitary pulmonary nodule: Secondary | ICD-10-CM

## 2019-07-27 NOTE — Telephone Encounter (Signed)
Per Dr. Tasia Catchings, most recent CT scan is stable and will need follow up CT scan in 6 months. Pt made aware of results and informed will be scheduled for next CT and follow up in 6 months. Pt requested appts be mailed to him. Nothing further needed at this time.

## 2019-08-02 ENCOUNTER — Other Ambulatory Visit: Payer: Self-pay

## 2019-08-02 ENCOUNTER — Encounter: Payer: Self-pay | Admitting: Radiation Oncology

## 2019-08-03 ENCOUNTER — Ambulatory Visit
Admission: RE | Admit: 2019-08-03 | Discharge: 2019-08-03 | Disposition: A | Discharge status: Home / Self Care | Payer: Medicare Other | Source: Ambulatory Visit | Attending: Radiation Oncology | Admitting: Radiation Oncology

## 2019-08-03 ENCOUNTER — Encounter: Payer: Self-pay | Admitting: Radiation Oncology

## 2019-08-03 ENCOUNTER — Other Ambulatory Visit: Payer: Self-pay | Admitting: *Deleted

## 2019-08-03 VITALS — BP 145/83 | HR 80 | Temp 95.0°F | Resp 16 | Wt 182.5 lb

## 2019-08-03 DIAGNOSIS — C7801 Secondary malignant neoplasm of right lung: Secondary | ICD-10-CM | POA: Diagnosis not present

## 2019-08-03 DIAGNOSIS — C3432 Malignant neoplasm of lower lobe, left bronchus or lung: Secondary | ICD-10-CM | POA: Diagnosis not present

## 2019-08-03 DIAGNOSIS — C3490 Malignant neoplasm of unspecified part of unspecified bronchus or lung: Secondary | ICD-10-CM

## 2019-08-03 NOTE — Progress Notes (Signed)
Radiation Oncology Follow up Note  Name: Philip Wood   Date:   08/03/2019 MRN:  [id number] DOB: October 18, 1945    This 74 y.o. male presents to the clinic today for 72-month follow-up.  Status post SBRT to his right upper lobe for stage I non-small cell lung cancer  REFERRING PROVIDER: Venita Lick, NP  HPI: Patient is a 74 year old male now out 4 months having completed SBRT to right upper lobe for a 2 new hypermetabolic lesions consistent with early stage lung cancer.  He also had SBRT almost 2 years prior for left lower lobe stage I non-small cell lung cancer seen today in routine follow-up he is doing well specifically denies cough hemoptysis or chest tightness..  He had a recent CT scan of his chest which shows interval decrease in the size of the anterior right upper lobe cavitary lesion as well as interval decrease in right suprahilar central right upper pulmonary nodules.  Basically has no evidence of progressive disease.  COMPLICATIONS OF TREATMENT: none  FOLLOW UP COMPLIANCE: keeps appointments   PHYSICAL EXAM:  BP (!) 145/83 (BP Location: Left Arm, Patient Position: Sitting, Cuff Size: Normal)   Pulse 80   Temp (!) 95 F (35 C) (Tympanic)   Resp 16   Wt 182 lb 8 oz (82.8 kg)   BMI 26.19 kg/m  Well-developed well-nourished patient in NAD. HEENT reveals PERLA, EOMI, discs not visualized.  Oral cavity is clear. No oral mucosal lesions are identified. Neck is clear without evidence of cervical or supraclavicular adenopathy. Lungs are clear to A&P. Cardiac examination is essentially unremarkable with regular rate and rhythm without murmur rub or thrill. Abdomen is benign with no organomegaly or masses noted. Motor sensory and DTR levels are equal and symmetric in the upper and lower extremities. Cranial nerves II through XII are grossly intact. Proprioception is intact. No peripheral adenopathy or edema is identified. No motor or sensory levels are noted. Crude visual fields are  within normal range.  RADIOLOGY RESULTS: CT scan reviewed compatible with above-stated findings  PLAN: Present time patient is doing well with no evidence of disease by CT criteria.  I am pleased with his overall progress.  I have asked to see him out in 6 months with a CT scan prior to that visit.  Patient knows to call sooner with any concerns he continues close follow-up care with medical oncology.  I would like to take this opportunity to thank you for allowing me to participate in the care of your patient.[name], MD

## 2019-08-03 NOTE — Addendum Note (Signed)
Encounter addended by: Daiva Huge, RN on: 08/03/2019 10:40 AM  Actions taken: Visit diagnoses modified

## 2019-08-25 ENCOUNTER — Ambulatory Visit (INDEPENDENT_AMBULATORY_CARE_PROVIDER_SITE_OTHER): Payer: Medicare Other

## 2019-08-25 DIAGNOSIS — Z0000 Encounter for general adult medical examination without abnormal findings: Secondary | ICD-10-CM | POA: Diagnosis not present

## 2019-08-25 NOTE — Patient Instructions (Signed)
Philip Wood , Thank you for taking time to come for your Medicare Wellness Visit. I appreciate your ongoing commitment to your health goals. Please review the following plan we discussed and let me know if I can assist you in the future.   Screening recommendations/referrals: Colonoscopy: fecal occult due 12/2019 Recommended yearly ophthalmology/optometry visit for glaucoma screening and checkup Recommended yearly dental visit for hygiene and checkup  Vaccinations: Influenza vaccine: up to date  Pneumococcal vaccine: up to date  Tdap vaccine: up to date Shingles vaccine: shingrix eligible    Covid-19:completed   Advanced directives: Advance directive discussed with you today.Once this is complete please bring a copy in to our office so we can scan it into your chart.  Conditions/risks identified: diabetic, please call and scheduled diabetic eye exam.   Next appointment: follow up in one year for your annual wellness visit.   Preventive Care 10 Years and Older, Male Preventive care refers to lifestyle choices and visits with your health care provider that can promote health and wellness. What does preventive care include?  A yearly physical exam. This is also called an annual well check.  Dental exams once or twice a year.  Routine eye exams. Ask your health care provider how often you should have your eyes checked.  Personal lifestyle choices, including:  Daily care of your teeth and gums.  Regular physical activity.  Eating a healthy diet.  Avoiding tobacco and drug use.  Limiting alcohol use.  Practicing safe sex.  Taking low doses of aspirin every day.  Taking vitamin and mineral supplements as recommended by your health care provider. What happens during an annual well check? The services and screenings done by your health care provider during your annual well check will depend on your age, overall health, lifestyle risk factors, and family history of  disease. Counseling  Your health care provider may ask you questions about your:  Alcohol use.  Tobacco use.  Drug use.  Emotional well-being.  Home and relationship well-being.  Sexual activity.  Eating habits.  History of falls.  Memory and ability to understand (cognition).  Work and work Statistician. Screening  You may have the following tests or measurements:  Height, weight, and BMI.  Blood pressure.  Lipid and cholesterol levels. These may be checked every 5 years, or more frequently if you are over 44 years old.  Skin check.  Lung cancer screening. You may have this screening every year starting at age 21 if you have a 30-pack-year history of smoking and currently smoke or have quit within the past 15 years.  Fecal occult blood test (FOBT) of the stool. You may have this test every year starting at age 58.  Flexible sigmoidoscopy or colonoscopy. You may have a sigmoidoscopy every 5 years or a colonoscopy every 10 years starting at age 31.  Prostate cancer screening. Recommendations will vary depending on your family history and other risks.  Hepatitis C blood test.  Hepatitis B blood test.  Sexually transmitted disease (STD) testing.  Diabetes screening. This is done by checking your blood sugar (glucose) after you have not eaten for a while (fasting). You may have this done every 1-3 years.  Abdominal aortic aneurysm (AAA) screening. You may need this if you are a current or former smoker.  Osteoporosis. You may be screened starting at age 17 if you are at high risk. Talk with your health care provider about your test results, treatment options, and if necessary, the need for more  tests. Vaccines  Your health care provider may recommend certain vaccines, such as:  Influenza vaccine. This is recommended every year.  Tetanus, diphtheria, and acellular pertussis (Tdap, Td) vaccine. You may need a Td booster every 10 years.  Zoster vaccine. You may  need this after age 74.  Pneumococcal 13-valent conjugate (PCV13) vaccine. One dose is recommended after age 50.  Pneumococcal polysaccharide (PPSV23) vaccine. One dose is recommended after age 68. Talk to your health care provider about which screenings and vaccines you need and how often you need them. This information is not intended to replace advice given to you by your health care provider. Make sure you discuss any questions you have with your health care provider. Document Released: 05/04/2015 Document Revised: 12/26/2015 Document Reviewed: 02/06/2015 Elsevier Interactive Patient Education  2017 [place] Prevention in the Home Falls can cause injuries. They can happen to people of all ages. There are many things you can do to make your home safe and to help prevent falls. What can I do on the outside of my home?  Regularly fix the edges of walkways and driveways and fix any cracks.  Remove anything that might make you trip as you walk through a door, such as a raised step or threshold.  Trim any bushes or trees on the path to your home.  Use bright outdoor lighting.  Clear any walking paths of anything that might make someone trip, such as rocks or tools.  Regularly check to see if handrails are loose or broken. Make sure that both sides of any steps have handrails.  Any raised decks and porches should have guardrails on the edges.  Have any leaves, snow, or ice cleared regularly.  Use sand or salt on walking paths during winter.  Clean up any spills in your garage right away. This includes oil or grease spills. What can I do in the bathroom?  Use night lights.  Install grab bars by the toilet and in the tub and shower. Do not use towel bars as grab bars.  Use non-skid mats or decals in the tub or shower.  If you need to sit down in the shower, use a plastic, non-slip stool.  Keep the floor dry. Clean up any water that spills on the floor as soon as it  happens.  Remove soap buildup in the tub or shower regularly.  Attach bath mats securely with double-sided non-slip rug tape.  Do not have throw rugs and other things on the floor that can make you trip. What can I do in the bedroom?  Use night lights.  Make sure that you have a light by your bed that is easy to reach.  Do not use any sheets or blankets that are too big for your bed. They should not hang down onto the floor.  Have a firm chair that has side arms. You can use this for support while you get dressed.  Do not have throw rugs and other things on the floor that can make you trip. What can I do in the kitchen?  Clean up any spills right away.  Avoid walking on wet floors.  Keep items that you use a lot in easy-to-reach places.  If you need to reach something above you, use a strong step stool that has a grab bar.  Keep electrical cords out of the way.  Do not use floor polish or wax that makes floors slippery. If you must use wax, use non-skid floor  wax.  Do not have throw rugs and other things on the floor that can make you trip. What can I do with my stairs?  Do not leave any items on the stairs.  Make sure that there are handrails on both sides of the stairs and use them. Fix handrails that are broken or loose. Make sure that handrails are as long as the stairways.  Check any carpeting to make sure that it is firmly attached to the stairs. Fix any carpet that is loose or worn.  Avoid having throw rugs at the top or bottom of the stairs. If you do have throw rugs, attach them to the floor with carpet tape.  Make sure that you have a light switch at the top of the stairs and the bottom of the stairs. If you do not have them, ask someone to add them for you. What else can I do to help prevent falls?  Wear shoes that:  Do not have high heels.  Have rubber bottoms.  Are comfortable and fit you well.  Are closed at the toe. Do not wear sandals.  If you  use a stepladder:  Make sure that it is fully opened. Do not climb a closed stepladder.  Make sure that both sides of the stepladder are locked into place.  Ask someone to hold it for you, if possible.  Clearly mark and make sure that you can see:  Any grab bars or handrails.  First and last steps.  Where the edge of each step is.  Use tools that help you move around (mobility aids) if they are needed. These include:  Canes.  Walkers.  Scooters.  Crutches.  Turn on the lights when you go into a dark area. Replace any light bulbs as soon as they burn out.  Set up your furniture so you have a clear path. Avoid moving your furniture around.  If any of your floors are uneven, fix them.  If there are any pets around you, be aware of where they are.  Review your medicines with your doctor. Some medicines can make you feel dizzy. This can increase your chance of falling. Ask your doctor what other things that you can do to help prevent falls. This information is not intended to replace advice given to you by your health care provider. Make sure you discuss any questions you have with your health care provider. Document Released: 02/01/2009 Document Revised: 09/13/2015 Document Reviewed: 05/12/2014 Elsevier Interactive Patient Education  2017 [employer].

## 2019-08-25 NOTE — Progress Notes (Signed)
Subjective:   Philip Wood is a 74 y.o. male who presents for Medicare Annual/Subsequent preventive examination.  This visit is being conducted via phone call  - after an attmept to do on video chat - due to the COVID-19 pandemic. This patient has given me verbal consent via phone to conduct this visit, patient states they are participating from their home address. Some vital signs may be absent or patient reported.   Patient identification: identified by name, DOB, and current address.    Review of Systems:   Cardiac Risk Factors include: advanced age (>25men, >51 women);dyslipidemia;male gender;hypertension;smoking/ tobacco exposure     Objective:    Vitals: There were no vitals taken for this visit.  There is no height or weight on file to calculate BMI.  Advanced Directives 08/25/2019 08/02/2019 07/12/2019 05/17/2019 03/15/2019 12/01/2018 09/20/2018  Does Patient Have a Medical Advance Directive? No No No No No No No  Would patient like information on creating a medical advance directive? - No - Patient declined - No - Patient declined No - Patient declined - -    Tobacco Social History   Tobacco Use  Smoking Status Light Tobacco Smoker  . Packs/day: 0.60  . Years: 53.00  . Pack years: 31.80  . Types: Cigarettes  Smokeless Tobacco Never Used  Tobacco Comment   Aprrox 10 cigs/day     Ready to quit: No Counseling given: Yes Comment: Aprrox 10 cigs/day   Clinical Intake:  Pre-visit preparation completed: Yes  Pain : No/denies pain     Nutritional Risks: None Diabetes: Yes CBG done?: No Did pt. bring in CBG monitor from home?: No  How often do you need to have someone help you when you read instructions, pamphlets, or other written materials from your doctor or pharmacy?: 1 - Never  Interpreter Needed?: No  Information entered by :: Torre Schaumburg,LPN  Past Medical History:  Diagnosis Date  . 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 COPD (chronic obstructive  pulmonary disease) ([place])   . Diabetes mellitus without complication ([place])   . GERD (gastroesophageal reflux disease)   . Hyperlipidemia   . Hypertension   . Lumbago   . Neuropathy   . Tobacco abuse disorder    Past Surgical History:  Procedure Laterality Date  . CHOLECYSTECTOMY    . ELECTROMAGNETIC NAVIGATION BROCHOSCOPY N/A 08/19/2017   Procedure: ELECTROMAGNETIC NAVIGATION BRONCHOSCOPY;  Surgeon: Flora Lipps, MD;  Location: [facility] ORS;  Service: Cardiopulmonary;  Laterality: N/A;  . ELECTROMAGNETIC NAVIGATION BROCHOSCOPY Left 09/02/2017   Procedure: ELECTROMAGNETIC NAVIGATION BRONCHOSCOPY;  Surgeon: Flora Lipps, MD;  Location: [facility] ORS;  Service: Cardiopulmonary;  Laterality: Left;  . THROAT SURGERY     Family History  Problem Relation Age of Onset  . Heart disease Father   . Heart attack Father   . Brain cancer Sister   . Cervical cancer Sister   . Rectal cancer Brother   . Lung cancer Brother   . Lung cancer Brother   . Lung cancer Brother   . Stomach cancer Sister   . Lung cancer Brother   . Stomach cancer Sister   . Diabetes Sister   . Lupus Sister    Social History   Socioeconomic History  . Marital status: Married    Spouse name: Not on file  . Number of children: Not on file  . Years of education: Not on file  . Highest education level: 10th grade  Occupational History  . Not on file  Tobacco  Use  . Smoking status: Light Tobacco Smoker    Packs/day: 0.60    Years: 53.00    Pack years: 31.80    Types: Cigarettes  . Smokeless tobacco: Never Used  . Tobacco comment: Aprrox 10 cigs/day  Substance and Sexual Activity  . Alcohol use: No    Alcohol/week: 0.0 standard drinks  . Drug use: No  . Sexual activity: Not Currently  Other Topics Concern  . Not on file  Social History Narrative   Works part time at [employer] Strain:   . Difficulty of Paying Living Expenses:   Food Insecurity:   . Worried About  Charity fundraiser in the Last Year:   . Arboriculturist in the Last Year:   Transportation Needs:   . Film/video editor (Medical):   Marland Kitchen Lack of Transportation (Non-Medical):   Physical Activity:   . Days of Exercise per Week:   . Minutes of Exercise per Session:   Stress:   . Feeling of Stress :   Social Connections:   . Frequency of Communication with Friends and Family:   . Frequency of Social Gatherings with Friends and Family:   . Attends Religious Services:   . Active Member of Clubs or Organizations:   . Attends Archivist Meetings:   Marland Kitchen Marital Status:     Outpatient Encounter Medications as of 08/25/2019  Medication Sig  . allopurinol (ZYLOPRIM) 100 MG tablet Take 1 tablet (100 mg total) by mouth daily.  Marland Kitchen aspirin 81 MG tablet Take 81 mg by mouth daily.  Marland Kitchen atorvastatin (LIPITOR) 80 MG tablet Take 1 tablet (80 mg total) by mouth daily at 6 PM.  . metFORMIN (GLUCOPHAGE) 500 MG tablet Take 1 tablet (500 mg total) by mouth 2 (two) times daily with a meal.  . metoprolol succinate (TOPROL XL) 25 MG 24 hr tablet Take 1 tablet (25 mg total) by mouth daily.  . ramipril (ALTACE) 10 MG capsule Take 1 capsule (10 mg total) by mouth daily.  Marland Kitchen tiotropium (SPIRIVA HANDIHALER) 18 MCG inhalation capsule Place 1 capsule (18 mcg total) into inhaler and inhale daily.   No facility-administered encounter medications on file as of 08/25/2019.    Activities of Daily Living In your present state of health, do you have any difficulty performing the following activities: 08/25/2019 09/20/2018  Hearing? N N  Vision? N N  Comment eyeglasses -  Difficulty concentrating or making decisions? N N  Walking or climbing stairs? N N  Dressing or bathing? N N  Doing errands, shopping? N N  Preparing Food and eating ? N N  Using the Toilet? N N  In the past six months, have you accidently leaked urine? N N  Do you have problems with loss of bowel control? N N  Managing your Medications? N N    Managing your Finances? N N  Housekeeping or managing your Housekeeping? N N  Some recent data might be hidden    Patient Care Team: Venita Lick, NP as PCP - General (Nurse Practitioner) Wellington Hampshire, MD as PCP - Cardiology (Cardiology) Beverly Gust, MD (Otolaryngology) Telford Nab, RN as Registered Nurse [name], MD as Radiation Oncologist (Radiation Oncology)   Assessment:   This is a routine wellness examination for Philip Wood.  Exercise Activities and Dietary recommendations Current Exercise Habits: Home exercise routine, Type of exercise: walking, Time (Minutes): 30, Frequency (Times/Week): 2, Weekly Exercise (Minutes/Week):  60, Intensity: Mild, Exercise limited by: None identified  Goals Addressed   None     Fall Risk: Fall Risk  08/25/2019 09/20/2018 07/13/2017 07/11/2016 03/28/2015  Falls in the past year? 0 0 No No No  Number falls in past yr: 0 - - - -  Injury with Fall? 0 - - - -    FALL RISK PREVENTION PERTAINING TO THE HOME:  Any stairs in or around the home? Yes  going in home  If so, are there any without handrails? No   Home free of loose throw rugs in walkways, pet beds, electrical cords, etc? Yes  Adequate lighting in your home to reduce risk of falls? Yes   ASSISTIVE DEVICES UTILIZED TO PREVENT FALLS:  Life alert? No  Use of a cane, walker or w/c? No  Grab bars in the bathroom? No  Shower chair or bench in shower? No  Elevated toilet seat or a handicapped toilet? No   TIMED UP AND GO: Unable to perform  Depression Screen PHQ 2/9 Scores 09/20/2018 07/13/2017 07/11/2016 03/28/2015  PHQ - 2 Score 0 0 0 0  PHQ- 9 Score - - 0 -    Cognitive Function     6CIT Screen 09/20/2018 07/13/2017  What Year? 0 points 0 points  What month? 0 points 0 points  What time? 0 points 0 points  Count back from 20 0 points 0 points  Months in reverse 0 points 0 points  Repeat phrase 2 points 0 points  Total Score 2 0    Immunization History   Administered Date(s) Administered  . Influenza, High Dose Seasonal PF 01/11/2016, 01/12/2017, 02/12/2018, 01/25/2019  . Influenza,inj,Quad PF,6+ Mos 03/28/2015  . Moderna SARS-COVID-2 Vaccination 06/18/2019, 07/16/2019  . Pneumococcal Conjugate-13 08/12/2013  . Pneumococcal Polysaccharide-23 02/06/2012  . Td 11/24/2005  . Tdap 08/04/2012    Qualifies for Shingles Vaccine? Yes  Zostavax completed n/a. Due for Shingrix. Education has been provided regarding the importance of this vaccine. Pt has been advised to call insurance company to determine out of pocket expense. Advised may also receive vaccine at local pharmacy or Health Dept. Verbalized acceptance and understanding.  Tdap: up to date   Flu Vaccine: up to date   Pneumococcal Vaccine: up to date    Covid-19 Vaccine: Completed vaccines  Screening Tests Health Maintenance  Topic Date Due  . OPHTHALMOLOGY EXAM  08/05/2018  . COLONOSCOPY  09/20/2019 (Originally 01/29/2016)  . INFLUENZA VACCINE  11/20/2019  . HEMOGLOBIN A1C  11/21/2019  . COLON CANCER SCREENING ANNUAL FOBT  12/08/2019  . FOOT EXAM  05/23/2020  . TETANUS/TDAP  08/05/2022  . COVID-19 Vaccine  Completed  . Hepatitis C Screening  Completed  . PNA vac Low Risk Adult  Completed   Cancer Screenings:  Colorectal Screening: Completed  Fecal occult testing 12/08/2018, declined colonoscopy   Lung Cancer Screening: (Low Dose CT Chest recommended if Age 110-80 years, 30 pack-year currently smoking OR have quit w/in 15years.) does not qualify.     Additional Screening:  Hepatitis C Screening: does qualify; Completed 2016  Vision Screening: Recommended annual ophthalmology exams for early detection of glaucoma and other disorders of the eye. Is the patient up to date with their annual eye exam?  Yes  Who is the provider or what is the name of the office in which the pt attends annual eye exams? [facility]    Dental Screening: Recommended annual dental  exams for proper oral hygiene  Community Resource Referral:  CRR required this visit?  No        Plan:  I have personally reviewed and addressed the Medicare Annual Wellness questionnaire and have noted the following in the patient's chart:  A. Medical and social history B. Use of alcohol, tobacco or illicit drugs  C. Current medications and supplements D. Functional ability and status E.  Nutritional status F.  Physical activity G. Advance directives H. List of other physicians I.  Hospitalizations, surgeries, and ER visits in previous 12 months J.  [facility] [place] [facility] such as hearing and vision if needed, cognitive and depression L. Referrals and appointments   In addition, I have reviewed and discussed with patient certain preventive protocols, quality metrics, and best practice recommendations. A written personalized care plan for preventive services as well as general preventive health recommendations were provided to patient.   Signed,   Bevelyn Ngo, LPN  06/21/9922 Nurse Health Advisor   Nurse Notes: none

## 2019-08-31 ENCOUNTER — Ambulatory Visit: Payer: Medicare Other | Admitting: Radiation Oncology

## 2019-09-06 LAB — FECAL OCCULT BLOOD, IMMUNOCHEMICAL: IFOBT: NEGATIVE

## 2019-09-27 ENCOUNTER — Other Ambulatory Visit: Payer: Self-pay

## 2019-11-22 ENCOUNTER — Ambulatory Visit: Payer: Medicare Other | Admitting: Nurse Practitioner

## 2019-11-29 ENCOUNTER — Encounter: Payer: Self-pay | Admitting: Nurse Practitioner

## 2019-11-29 ENCOUNTER — Other Ambulatory Visit: Payer: Self-pay

## 2019-11-29 ENCOUNTER — Ambulatory Visit (INDEPENDENT_AMBULATORY_CARE_PROVIDER_SITE_OTHER): Payer: Medicare Other | Admitting: Nurse Practitioner

## 2019-11-29 VITALS — BP 134/68 | HR 66 | Temp 97.6°F | Wt 184.8 lb

## 2019-11-29 DIAGNOSIS — E1169 Type 2 diabetes mellitus with other specified complication: Secondary | ICD-10-CM | POA: Diagnosis not present

## 2019-11-29 DIAGNOSIS — N183 Chronic kidney disease, stage 3 unspecified: Secondary | ICD-10-CM

## 2019-11-29 DIAGNOSIS — I130 Hypertensive heart and chronic kidney disease with heart failure and stage 1 through stage 4 chronic kidney disease, or unspecified chronic kidney disease: Secondary | ICD-10-CM | POA: Diagnosis not present

## 2019-11-29 DIAGNOSIS — F17219 Nicotine dependence, cigarettes, with unspecified nicotine-induced disorders: Secondary | ICD-10-CM

## 2019-11-29 DIAGNOSIS — C3490 Malignant neoplasm of unspecified part of unspecified bronchus or lung: Secondary | ICD-10-CM

## 2019-11-29 DIAGNOSIS — J449 Chronic obstructive pulmonary disease, unspecified: Secondary | ICD-10-CM

## 2019-11-29 DIAGNOSIS — E0822 Diabetes mellitus due to underlying condition with diabetic chronic kidney disease: Secondary | ICD-10-CM

## 2019-11-29 DIAGNOSIS — I5022 Chronic systolic (congestive) heart failure: Secondary | ICD-10-CM | POA: Diagnosis not present

## 2019-11-29 DIAGNOSIS — E785 Hyperlipidemia, unspecified: Secondary | ICD-10-CM | POA: Diagnosis not present

## 2019-11-29 LAB — BAYER DCA HB A1C WAIVED: HB A1C (BAYER DCA - WAIVED): 5.5 % (ref <7.0)

## 2019-11-29 NOTE — Assessment & Plan Note (Signed)
Chronic, ongoing.  Continue current medication regimen.  Recommend complete smoking cessation.  Consider addition of Albuterol at next visit as rescue inhaler.  At this time he denies symptoms.

## 2019-11-29 NOTE — Assessment & Plan Note (Signed)
Continue collaboration with oncology.  Recommend complete cessation of smoking.

## 2019-11-29 NOTE — Assessment & Plan Note (Signed)
Chronic, ongoing with initial elevation in BP today and repeat at goal.  Home BP at goal.  Continue current medication regimen and collaboration with cardiology.  BMP today.  Recommend focus on DASH diet at home and continue to monitor BP and weight closely. Return in 6 months.

## 2019-11-29 NOTE — Progress Notes (Signed)
BP 134/68 (BP Location: Left Arm)   Pulse 66   Temp 97.6 F (36.4 C) (Oral)   Wt 184 lb 12.8 oz (83.8 kg)   SpO2 98%   BMI 26.52 kg/m    Subjective:    Patient ID: Philip Wood, male    DOB: 02-19-1946, 74 y.o.   MRN: [id number]  HPI: Philip Wood is a 74 y.o. male  Chief Complaint  Patient presents with  . Hyperlipidemia  . Hypertension  . Diabetes  . Congestive Heart Failure   DIABETES Continues on Metformin 500 MG BID.  Last A1C was 5.7% last check. Hypoglycemic episodes:no Polydipsia/polyuria: no Visual disturbance: no Chest pain: no Paresthesias: no Glucose Monitoring: no  Accucheck frequency: Not Checking  Fasting glucose:  Post prandial:  Evening:  Before meals: Taking Insulin?: no  Long acting insulin:  Short acting insulin: Blood Pressure Monitoring: a few times a week Retinal Examination: Not up to Date Foot Exam: Up to Date Pneumovax: Up to Date Influenza: Up to Date Aspirin: yes   HYPERTENSION / HYPERLIPIDEMIA/HF Continues Ramipril, Metoprolol, and Atorvastatin + ASA.  Is followed by cardiology and last saw in March 2021, PA Dunn -- returns to see in 6 months.  Last echo EF 45-50% in 2019.   States weight at home is staying around 180-182 lbs.  Satisfied with current treatment? yes Duration of hypertension: chronic BP monitoring frequency: a few times a week BP range: 120/70's average BP medication side effects: no Duration of hyperlipidemia: chronic Cholesterol medication side effects: no Cholesterol supplements: none Medication compliance: good compliance Aspirin: yes Recent stressors: no Recurrent headaches: no Visual changes: no Palpitations: no Dyspnea: baseline, no worse Chest pain: no Lower extremity edema: no Dizzy/lightheaded: no   LUNG CANCER (Right upper lobe nodule ): Followed by oncology and received radiation treatment, no further treatments at this time.  Last saw Dr. Baruch Gouty on 08/03/19, sees every 6 months.  Smokes more than 1/2 PPD, he tried to quit but was unsuccessful.  Is not interested in quitting at this time.  Last followed by pulmonary in 2019, Dr. Mortimer Fries, for COPD.  Relevant past medical, surgical, family and social history reviewed and updated as indicated. Interim medical history since our last visit reviewed. Allergies and medications reviewed and updated.  Review of Systems  Constitutional: Negative for activity change, diaphoresis, fatigue and fever.  Respiratory: Negative for cough, chest tightness, shortness of breath and wheezing.   Cardiovascular: Negative for chest pain, palpitations and leg swelling.  Gastrointestinal: Negative.   Endocrine: Negative for cold intolerance, heat intolerance, polydipsia, polyphagia and polyuria.  Neurological: Negative.   Psychiatric/Behavioral: Negative.     Per HPI unless specifically indicated above     Objective:    BP 134/68 (BP Location: Left Arm)   Pulse 66   Temp 97.6 F (36.4 C) (Oral)   Wt 184 lb 12.8 oz (83.8 kg)   SpO2 98%   BMI 26.52 kg/m   Wt Readings from Last 3 Encounters:  11/29/19 184 lb 12.8 oz (83.8 kg)  08/02/19 182 lb 8 oz (82.8 kg)  07/12/19 181 lb 1.6 oz (82.1 kg)    Physical Exam Vitals and nursing note reviewed.  Constitutional:      General: He is awake. He is not in acute distress.    Appearance: He is well-developed and well-groomed. He is not ill-appearing.  HENT:     Head: Normocephalic and atraumatic.     Right Ear: Hearing normal. No drainage.  Left Ear: Hearing normal. No drainage.  Eyes:     General: Lids are normal.        Right eye: No discharge.        Left eye: No discharge.     Conjunctiva/sclera: Conjunctivae normal.     Pupils: Pupils are equal, round, and reactive to light.  Cardiovascular:     Rate and Rhythm: Normal rate and regular rhythm.     Heart sounds: Normal heart sounds, S1 normal and S2 normal. No murmur heard.  No gallop.   Pulmonary:     Effort: Pulmonary effort  is normal. No accessory muscle usage or respiratory distress.     Breath sounds: Normal breath sounds.  Abdominal:     General: Bowel sounds are normal.     Palpations: Abdomen is soft.  Musculoskeletal:        General: Normal range of motion.     Cervical back: Normal range of motion and neck supple.     Right lower leg: No edema.     Left lower leg: No edema.  Skin:    General: Skin is warm and dry.  Neurological:     Mental Status: He is alert and oriented to person, place, and time.  Psychiatric:        Mood and Affect: Mood normal.        Behavior: Behavior normal. Behavior is cooperative.        Thought Content: Thought content normal.        Judgment: Judgment normal.     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  Home BP at goal.  Continue current medication regimen and collaboration with cardiology.  BMP today.  Recommend focus on DASH diet at home and continue to monitor BP and weight closely. Return in 6 months.      Relevant Orders   Basic metabolic panel   Chronic systolic heart failure (HCC)    Chronic, ongoing. Continue current medication regimen and collaboration with cardiology.  BMP today.  Recommend: - Reminded to call for an overnight weight gain of >2 pounds or a weekly weight weight of >5 pounds - not adding salt to his food and has been reading food labels. Reviewed the importance of keeping daily sodium intake to 2000mg  daily         Respiratory   COPD (chronic obstructive pulmonary disease) (HCC)    Chronic, ongoing.  Continue current medication regimen.  Recommend complete smoking cessation.   Consider addition of Albuterol at next visit as rescue inhaler.  At this time he denies symptoms.      Lung cancer [facility])    Continue collaboration with oncology.  Recommend complete cessation of smoking.        Endocrine   Diabetes mellitus with chronic kidney disease ([place]) - Primary    Chronic, ongoing.  A1C 5.5% today.  Urine ALB 80 and A:C 30-300 last visit, continue Ramipril for kidney protection.  Continue current medication regimen and adjust as needed.  Recommend he check BS at home at least a few mornings a  week.  Consider reduction of medication next visit, if remains stable.  Return in 3 months.      Relevant Orders   Bayer DCA Hb A1c Waived   Basic metabolic panel   Hyperlipidemia associated with type 2 diabetes mellitus (HCC)    Chronic, ongoing.  Continue current medication regimen and adjust as needed.  Lipid panel today.      Relevant Orders   Bayer DCA Hb A1c Waived   Lipid Panel w/o Chol/HDL Ratio     Nervous and Auditory   Nicotine dependence, cigarettes, w unsp disorders    I have recommended complete cessation of tobacco use. I have discussed various options available for assistance with tobacco cessation including over the counter methods (Nicotine gum, patch and lozenges). We also discussed prescription options (Chantix, Nicotine Inhaler / Nasal Spray). The patient is not interested in pursuing any prescription tobacco cessation options at this time.           Follow up plan: Return in about 6 months (around 05/31/2020) for T2DM, HTN/HLD/HF, CA.

## 2019-11-29 NOTE — Assessment & Plan Note (Signed)
Chronic, ongoing.  Continue current medication regimen and adjust as needed. Lipid panel today. 

## 2019-11-29 NOTE — Assessment & Plan Note (Addendum)
Chronic, ongoing. Continue current medication regimen and collaboration with cardiology.  BMP today.  Recommend: - Reminded to call for an overnight weight gain of >2 pounds or a weekly weight weight of >5 pounds - not adding salt to his food and has been reading food labels. Reviewed the importance of keeping daily sodium intake to 2000mg  daily

## 2019-11-29 NOTE — Patient Instructions (Signed)

## 2019-11-29 NOTE — Assessment & Plan Note (Signed)
I have recommended complete cessation of tobacco use. I have discussed various options available for assistance with tobacco cessation including over the counter methods (Nicotine gum, patch and lozenges). We also discussed prescription options (Chantix, Nicotine Inhaler / Nasal Spray). The patient is not interested in pursuing any prescription tobacco cessation options at this time.  

## 2019-11-29 NOTE — Assessment & Plan Note (Signed)
Chronic, ongoing.  A1C 5.5% today.  Urine ALB 80 and A:C 30-300 last visit, continue Ramipril for kidney protection.  Continue current medication regimen and adjust as needed.  Recommend he check BS at home at least a few mornings a week.  Consider reduction of medication next visit, if remains stable.  Return in 3 months.

## 2019-11-30 LAB — BASIC METABOLIC PANEL
BUN/Creatinine Ratio: 13 (ref 10–24)
BUN: 14 mg/dL (ref 8–27)
CO2: 27 mmol/L (ref 20–29)
Calcium: 9.2 mg/dL (ref 8.6–10.2)
Chloride: 101 mmol/L (ref 96–106)
Creatinine, Ser: 1.04 mg/dL (ref 0.76–1.27)
GFR calc Af Amer: 81 mL/min/{1.73_m2} (ref >59)
GFR calc non Af Amer: 70 mL/min/{1.73_m2} (ref >59)
Glucose: 94 mg/dL (ref 65–99)
Potassium: 5.3 mmol/L — ABNORMAL HIGH (ref 3.5–5.2)
Sodium: 142 mmol/L (ref 134–144)

## 2019-11-30 LAB — LIPID PANEL W/O CHOL/HDL RATIO
Cholesterol, Total: 136 mg/dL (ref 100–199)
HDL: 37 mg/dL — ABNORMAL LOW (ref >39)
LDL Chol Calc (NIH): 79 mg/dL (ref 0–99)
Triglycerides: 108 mg/dL (ref 0–149)
VLDL Cholesterol Cal: 20 mg/dL (ref 5–40)

## 2019-12-29 ENCOUNTER — Encounter: Payer: Self-pay | Admitting: Cardiovascular Disease

## 2019-12-29 ENCOUNTER — Other Ambulatory Visit: Payer: Self-pay

## 2019-12-29 ENCOUNTER — Ambulatory Visit: Payer: Medicare Other | Admitting: Cardiovascular Disease

## 2019-12-29 VITALS — BP 130/70 | HR 68 | Ht 69.0 in | Wt 182.1 lb

## 2019-12-29 DIAGNOSIS — I5022 Chronic systolic (congestive) heart failure: Secondary | ICD-10-CM | POA: Diagnosis not present

## 2019-12-29 DIAGNOSIS — I10 Essential (primary) hypertension: Secondary | ICD-10-CM | POA: Diagnosis not present

## 2019-12-29 DIAGNOSIS — I739 Peripheral vascular disease, unspecified: Secondary | ICD-10-CM

## 2019-12-29 DIAGNOSIS — Z720 Tobacco use: Secondary | ICD-10-CM | POA: Diagnosis not present

## 2019-12-29 NOTE — Progress Notes (Signed)
Cardiology Office Note   Date:  12/29/2019   ID:  Philip Wood, DOB 1945/07/15, MRN [id number]  PCP:  Venita Lick, NP  Cardiologist:   Kathlyn Sacramento, MD   Chief Complaint  Patient presents with  . OTHER    6 month f/u no complaints today. Meds reviewed verbally with pt.      History of Present Illness: Philip Wood is a 74 y.o. male who is here today for a follow-up visit regarding chronic systolic heart failure.    The patient has prolonged history of tobacco use and lung cancer. He has multiple chronic medical conditions that include type 2 diabetes, hypertension, hyperlipidemia, tobacco use, laryngeal carcinoma status post radiation, COPD and peripheral arterial disease with bilateral leg claudication followed by [facility]. He has remote history of alcohol abuse. He had cardiac work-up in 2019 for preoperative cardiovascular evaluation. Lexiscan Myoview showed small severe defect in the apex most likely artifact with no significant ischemia.  However, his EF was moderately reduced at 30 to 45%.  He underwent an echocardiogram which showed an EF of 45 to 50% with grade 2 diastolic dysfunction, mild to moderate TR and mild to moderate pulmonary hypertension with peak systolic pressure of 44 mmHg.   He has been treated medically and has been doing well.  He denies any chest pain or shortness of breath.  No palpitations or leg edema.  He denies leg claudication. Unfortunately, he continues to smoke half a pack per day.  He does not think he can quit.  He started smoking when he was 74 years old.  Past Medical History:  Diagnosis Date  . Cancer ([place])    laryngeal  . COPD (chronic obstructive pulmonary disease) ([place])   . Diabetes mellitus without complication ([place])   . GERD (gastroesophageal reflux disease)   . Hyperlipidemia   . Hypertension   . Lumbago   . Neuropathy   . Tobacco abuse disorder     Past Surgical History:  Procedure Laterality  Date  . CHOLECYSTECTOMY    . ELECTROMAGNETIC NAVIGATION BROCHOSCOPY N/A 08/19/2017   Procedure: ELECTROMAGNETIC NAVIGATION BRONCHOSCOPY;  Surgeon: Flora Lipps, MD;  Location: [facility] ORS;  Service: Cardiopulmonary;  Laterality: N/A;  . ELECTROMAGNETIC NAVIGATION BROCHOSCOPY Left 09/02/2017   Procedure: ELECTROMAGNETIC NAVIGATION BRONCHOSCOPY;  Surgeon: Flora Lipps, MD;  Location: [facility] ORS;  Service: Cardiopulmonary;  Laterality: Left;  . THROAT SURGERY       Current Outpatient Medications  Medication Sig Dispense Refill  . allopurinol (ZYLOPRIM) 100 MG tablet Take 1 tablet (100 mg total) by mouth daily. 90 tablet 2  . aspirin 81 MG tablet Take 81 mg by mouth daily.    Marland Kitchen atorvastatin (LIPITOR) 80 MG tablet Take 1 tablet (80 mg total) by mouth daily at 6 PM. 90 tablet 3  . metFORMIN (GLUCOPHAGE) 500 MG tablet Take 1 tablet (500 mg total) by mouth 2 (two) times daily with a meal. 180 tablet 1  . metoprolol succinate (TOPROL XL) 25 MG 24 hr tablet Take 1 tablet (25 mg total) by mouth daily. 90 tablet 3  . ramipril (ALTACE) 10 MG capsule Take 1 capsule (10 mg total) by mouth daily. 90 capsule 3  . tiotropium (SPIRIVA HANDIHALER) 18 MCG inhalation capsule Place 1 capsule (18 mcg total) into inhaler and inhale daily. 90 capsule 3   No current facility-administered medications for this visit.    Allergies:   Patient has no known allergies.    Social History:  The patient  reports that he has been smoking cigarettes. He has a 31.80 pack-year smoking history. He has never used smokeless tobacco. He reports that he does not drink alcohol and does not use drugs.   Family History:  The patient's family history includes Brain cancer in his sister; Cervical cancer in his sister; Diabetes in his sister; Heart attack in his father; Heart disease in his father; Lung cancer in his brother, brother, brother, and brother; Lupus in his sister; Rectal cancer in his brother; Stomach cancer in his sister and sister.      ROS:  Please see the history of present illness.   Otherwise, review of systems are positive for none.   All other systems are reviewed and negative.    PHYSICAL EXAM: VS:  BP 130/70 (BP Location: Left Arm, Patient Position: Sitting, Cuff Size: Normal)   Pulse 68   Ht 5\' 9"  (1.753 m)   Wt 182 lb 2 oz (82.6 kg)   SpO2 97%   BMI 26.90 kg/m  , BMI Body mass index is 26.9 kg/m. GEN: Well nourished, well developed, in no acute distress  HEENT: normal  Neck: no JVD,  or masses.  Left carotid bruit Cardiac: RRR; no murmurs, rubs, or gallops,no edema  Respiratory:  clear to auscultation bilaterally, normal work of breathing GI: soft, nontender, nondistended, + BS MS: no deformity or atrophy  Skin: warm and dry, no rash Neuro:  Strength and sensation are intact Psych: euthymic mood, full affect   EKG:  EKG is ordered today. The ekg ordered today demonstrates normal sinus rhythm with no significant ST or T wave changes.   Recent Labs: 07/12/2019: ALT 17; Hemoglobin 14.2; Platelets 143 11/29/2019: BUN 14; Creatinine, Ser 1.04; Potassium 5.3; Sodium 142    Lipid Panel    Component Value Date/Time   CHOL 136 11/29/2019 1006   CHOL 149 08/12/2018 0838   TRIG 108 11/29/2019 1006   TRIG 111 08/12/2018 0838   HDL 37 (L) 11/29/2019 1006   VLDL 22 08/12/2018 0838   LDLCALC 79 11/29/2019 1006      Wt Readings from Last 3 Encounters:  12/29/19 182 lb 2 oz (82.6 kg)  11/29/19 184 lb 12.8 oz (83.8 kg)  08/02/19 182 lb 8 oz (82.8 kg)      PAD Screen 08/21/2017  Previous PAD dx? No  Previous surgical procedure? Yes  Pain with walking? No  Feet/toe relief with dangling? No  Painful, non-healing ulcers? No  Extremities discolored? No      ASSESSMENT AND PLAN:  1.  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  Continue treatment with Toprol and ramipril.  No evidence of volume overload  2.  Left carotid bruit:  Carotid Doppler showed mild nonobstructive disease.  3.  Peripheral arterial disease with bilateral leg claudication: Followed by [facility].  4.  Hyperlipidemia: Currently on atorvastatin 80 mg once daily.  Recent lipid profile showed an LDL of 79.  Recommend a target LDL of less than 70.  I discussed with her option of adding Zetia but the patient prefers to work on his diet before adding another medication.  5.  Essential hypertension: Blood pressure is controlled on current medications.  6.  Tobacco use: He reports inability to quit.   Disposition:   FU with me in 12 months  Signed,  Kathlyn Sacramento, MD  12/29/2019 8:35 AM    [place]

## 2019-12-29 NOTE — Patient Instructions (Signed)
Medication Instructions:  Your physician recommends that you continue on your current medications as directed. Please refer to the Current Medication list given to you today.  *If you need a refill on your cardiac medications before your next appointment, please call your pharmacy*   Lab Work: None ordered If you have labs (blood work) drawn today and your tests are completely normal, you will receive your results only by: Marland Kitchen MyChart Message (if you have MyChart) OR . A paper copy in the mail If you have any lab test that is abnormal or we need to change your treatment, we will call you to review the results.   Testing/Procedures: None ordered   Follow-Up: At [facility], you and your health needs are our priority.  As part of our continuing mission to provide you with exceptional heart care, we have created designated Provider Care Teams.  These Care Teams include your primary Cardiologist (physician) and Advanced Practice Providers (APPs -  Physician Assistants and Nurse Practitioners) who all work together to provide you with the care you need, when you need it.  We recommend signing up for the patient portal called "MyChart".  Sign up information is provided on this After Visit Summary.  MyChart is used to connect with patients for Virtual Visits (Telemedicine).  Patients are able to view lab/test results, encounter notes, upcoming appointments, etc.  Non-urgent messages can be sent to your provider as well.   To learn more about what you can do with MyChart, go to [email or web address].    Your next appointment:   12 month(s)  The format for your next appointment:   In Person  Provider:    You may see Kathlyn Sacramento, MD or one of the following Advanced Practice Providers on your designated Care Team:    Murray Hodgkins, NP  Christell Faith, PA-C  Marrianne Mood, PA-C    Other Instructions N/A

## 2020-01-19 DIAGNOSIS — Z0100 Encounter for examination of eyes and vision without abnormal findings: Secondary | ICD-10-CM | POA: Diagnosis not present

## 2020-01-19 DIAGNOSIS — E119 Type 2 diabetes mellitus without complications: Secondary | ICD-10-CM | POA: Diagnosis not present

## 2020-01-24 ENCOUNTER — Ambulatory Visit
Admission: RE | Admit: 2020-01-24 | Discharge: 2020-01-24 | Disposition: A | Discharge status: Home / Self Care | Payer: Medicare Other | Source: Ambulatory Visit | Attending: Oncology | Admitting: Oncology

## 2020-01-24 ENCOUNTER — Other Ambulatory Visit: Payer: Self-pay

## 2020-01-24 DIAGNOSIS — R911 Solitary pulmonary nodule: Secondary | ICD-10-CM | POA: Diagnosis not present

## 2020-01-24 DIAGNOSIS — I2510 Atherosclerotic heart disease of native coronary artery without angina pectoris: Secondary | ICD-10-CM | POA: Diagnosis not present

## 2020-01-24 DIAGNOSIS — C3490 Malignant neoplasm of unspecified part of unspecified bronchus or lung: Secondary | ICD-10-CM | POA: Diagnosis not present

## 2020-01-24 DIAGNOSIS — J984 Other disorders of lung: Secondary | ICD-10-CM | POA: Diagnosis not present

## 2020-01-24 DIAGNOSIS — I700 Atherosclerosis of aorta: Secondary | ICD-10-CM | POA: Diagnosis not present

## 2020-01-24 LAB — POCT I-STAT CREATININE: Creatinine, Ser: 1.2 mg/dL (ref 0.61–1.24)

## 2020-01-24 IMAGING — CT CT CHEST W/ CM
2 of 4 series · 15 of 36 positions shown, 18 images · IV contrast (omnipaque)
Comparison: [DATE].

CLINICAL DATA: Restaging lung cancer. Status post SBRP of right
upper lobe nodule

EXAM:
CT CHEST WITH CONTRAST
TECHNIQUE: Multidetector CT imaging of the chest was performed during
intravenous contrast administration.
CONTRAST:  75mL OMNIPAQUE IOHEXOL 300 MG/ML  SOLN

[Series 2: axial chest 2.00 · axial · 0.81mm/px · z∈[-1202,-912]mm · 12 of 173 slices shown, 15 images]
[im 14/173  mediastinal]
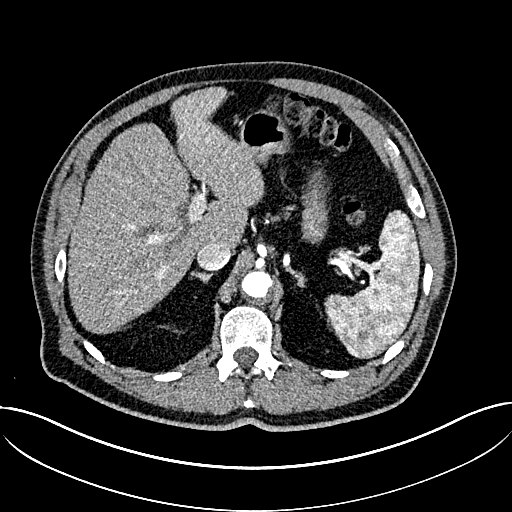
[im 14/173  lung]
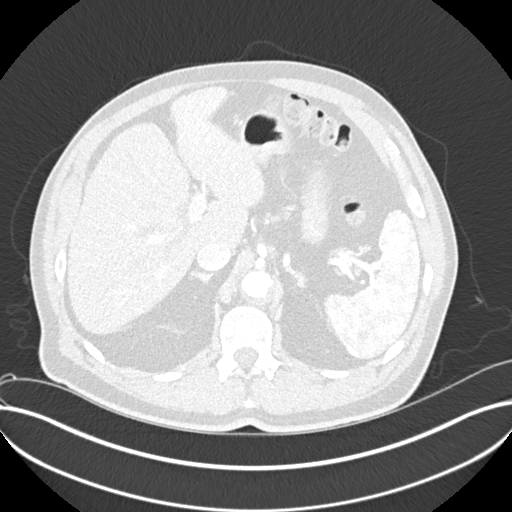
[im 27/173  lung]
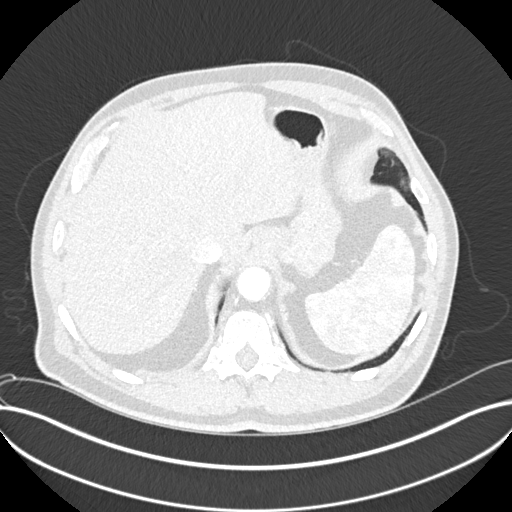
[im 40/173  lung]
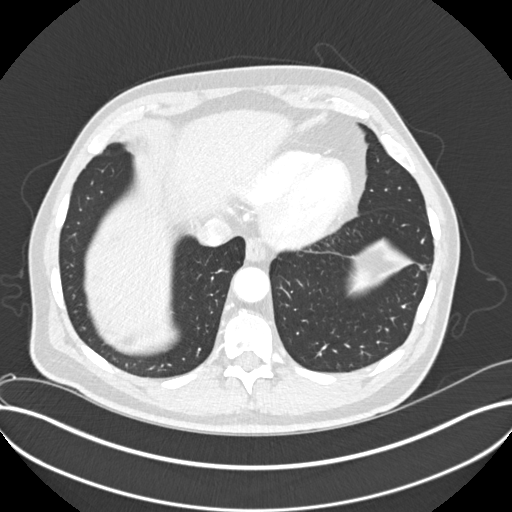
[im 53/173  lung]
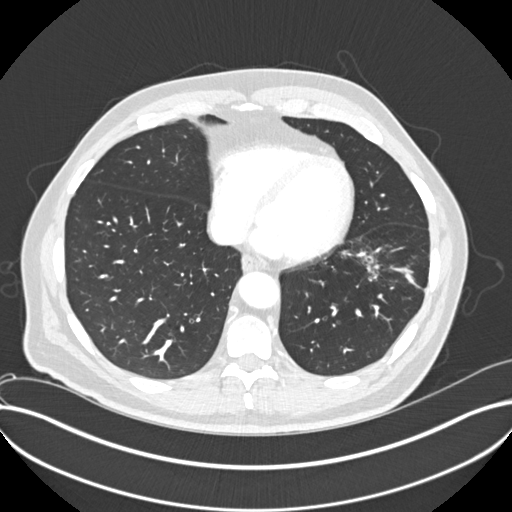
[im 67/173  mediastinal]
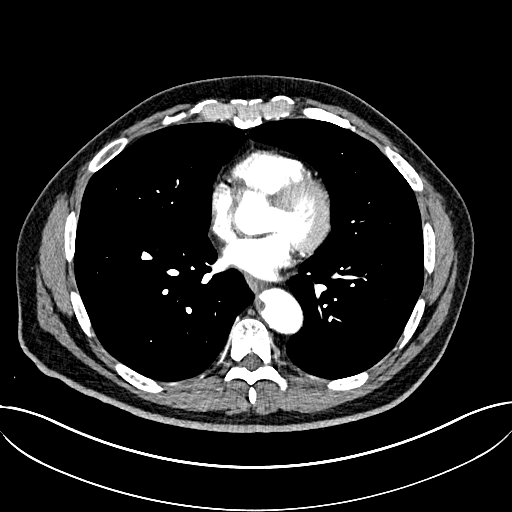
[im 67/173  lung]
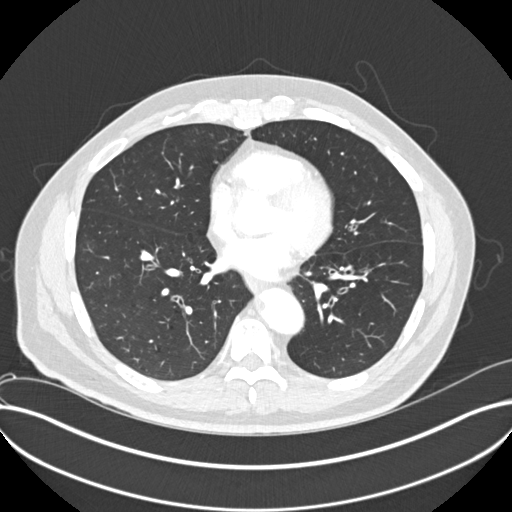
[im 80/173  lung]
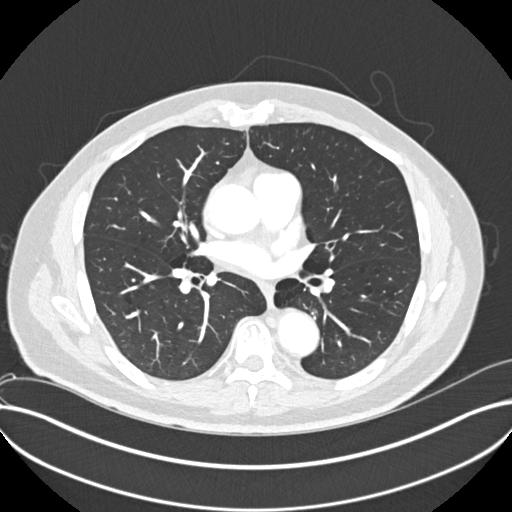
[im 93/173  lung]
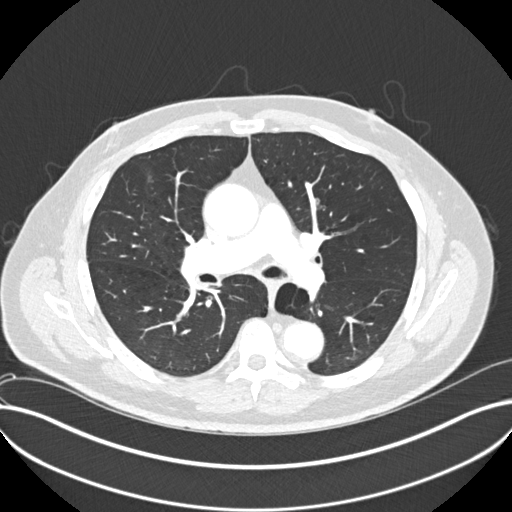
[im 106/173  lung]
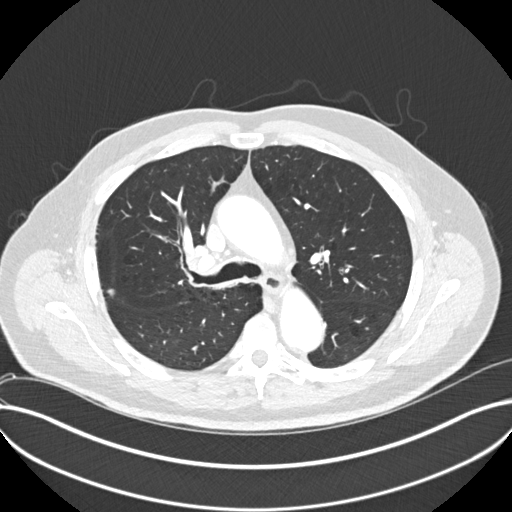
[im 120/173  mediastinal]
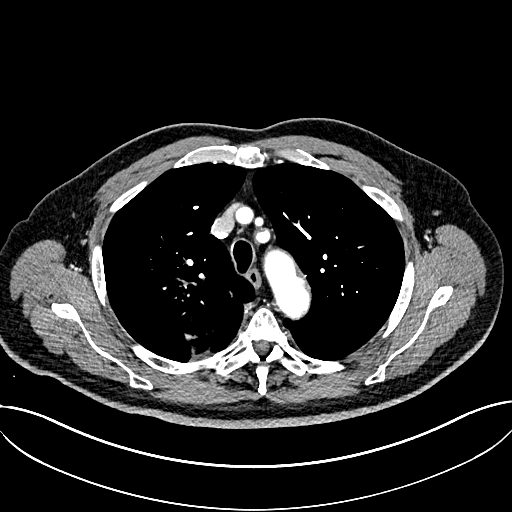
[im 120/173  lung]
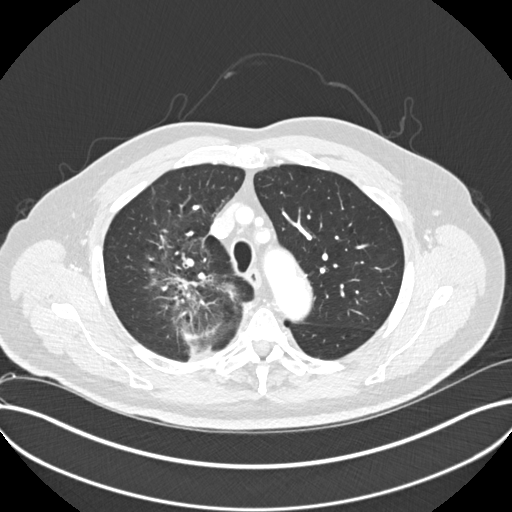
[im 133/173  lung]
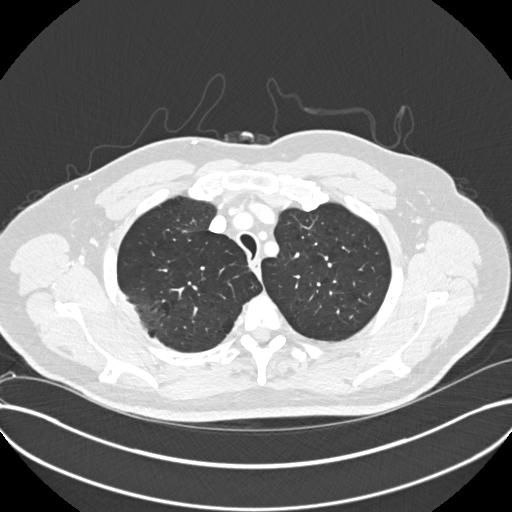
[im 146/173  lung]
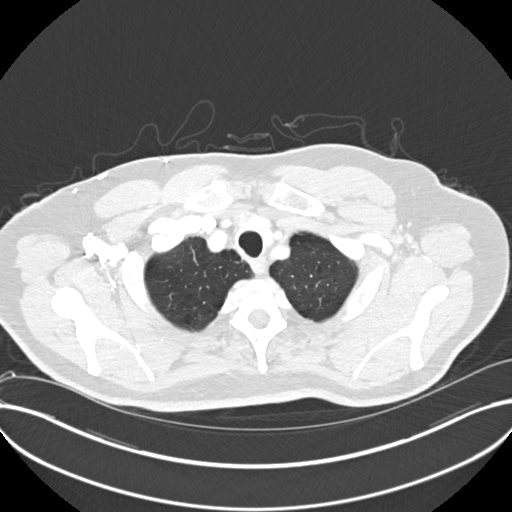
[im 159/173  lung]
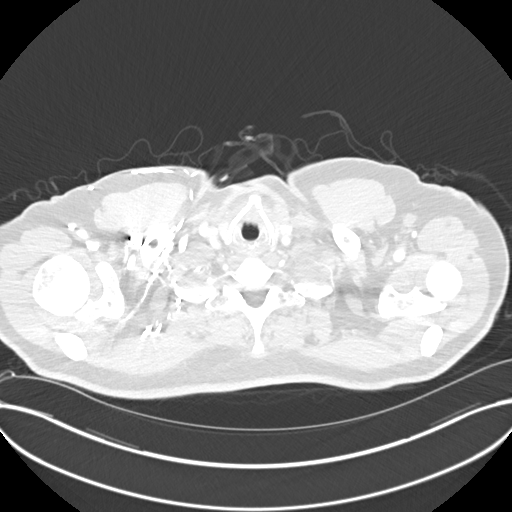

[Series 4: coronal chest 2.00 cor · coronal · 0.68mm/px · 3 of 172 slices shown]
[im 35/172  lung]
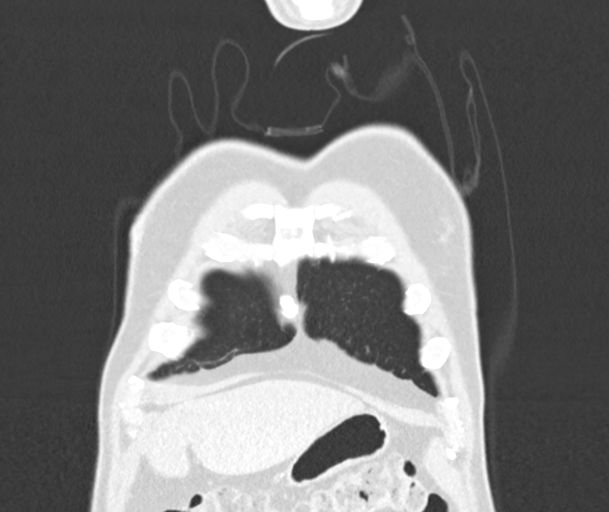
[im 69/172  lung]
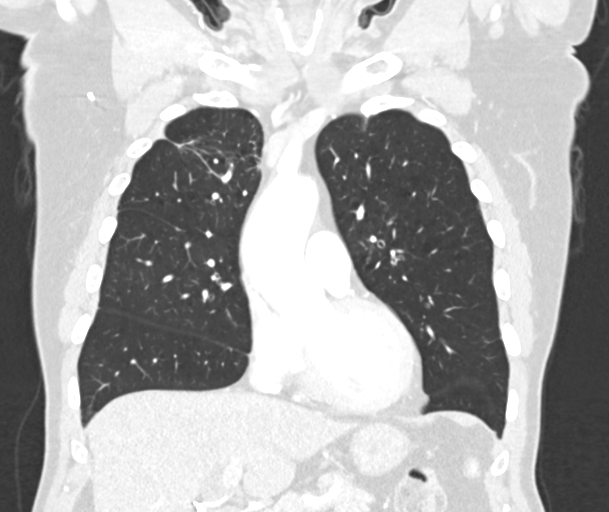
[im 103/172  lung]
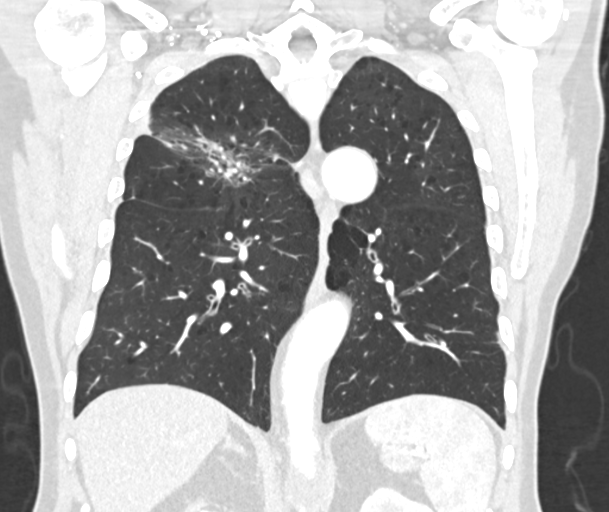

[15 of 36 positions shown; findings below may reference images not displayed]

FINDINGS: Cardiovascular: Normal heart size. No pericardial effusion
identified. Aortic atherosclerosis. Coronary artery calcifications.
No central obstructing pulmonary embolus identified.

Mediastinum/Nodes: Normal appearance of the thyroid gland. The
trachea appears patent and is midline. Normal appearance of the
esophagus. No enlarged supraclavicular, axillary, mediastinal, or
hilar lymph nodes.

Lungs/Pleura: Centrilobular emphysema. No pleural effusion,
atelectasis or pneumothorax. Scarring and architectural distortion
within the anterior lateral left lower lobe is again noted and is
presumed to represent post treatment changes. Similarly, there is a
progressive bandlike area of architectural distortion, ground-glass
attenuation, and scarring within the right upper lobe compatible
with changes secondary to external beam radiation. The previous
cavitary lesion within the anterior right upper lobe is no longer
visualized separate from these changes. Similarly, the previously
referenced 6 mm nodular density within the right lung apex which is
no longer visualized separate from changes due to external beam
radiation. The previously referenced right upper lobe perihilar
nodule is unchanged measuring 1.4 x 0.8 cm, image 60/3.

Upper Abdomen: Chronic right renal atrophy. Previous
cholecystectomy. Similar appearance of numerous small hypodensities
scattered throughout the spleen. No acute abnormality identified
within the imaged portions of the upper abdomen.

Musculoskeletal: No chest wall abnormality. No acute or significant
osseous findings.
IMPRESSION: 1. Progressive post treatment changes compatible with external beam
radiation noted within the right upper lobe. The previously
referenced peripheral nodular densities within the right upper lobe
are no longer identified separate from these changes.
2. Stable appearance of central right upper lobe perihilar nodular
density.
3. Similar appearance of post treatment change within the superior
segment of left lower lobe.
4. Coronary artery calcifications.

Aortic Atherosclerosis ([KF]-[KF]) and Emphysema ([KF]-[KF]).

## 2020-01-24 MED ORDER — IOHEXOL 300 MG/ML  SOLN
75.0000 mL | Freq: Once | INTRAMUSCULAR | Status: AC | PRN
  Administered 2020-01-24: 75 mL via INTRAVENOUS

## 2020-01-26 ENCOUNTER — Ambulatory Visit
Admission: RE | Admit: 2020-01-26 | Discharge: 2020-01-26 | Disposition: A | Discharge status: Home / Self Care | Payer: Medicare Other | Source: Ambulatory Visit | Attending: Radiation Oncology | Admitting: Radiation Oncology

## 2020-01-26 ENCOUNTER — Encounter: Payer: Self-pay | Admitting: Oncology

## 2020-01-26 ENCOUNTER — Inpatient Hospital Stay: Payer: Medicare Other | Attending: Oncology | Admitting: Oncology

## 2020-01-26 ENCOUNTER — Other Ambulatory Visit: Payer: Self-pay

## 2020-01-26 VITALS — BP 154/82 | HR 61 | Temp 95.1°F | Resp 18 | Wt 184.6 lb

## 2020-01-26 DIAGNOSIS — I5022 Chronic systolic (congestive) heart failure: Secondary | ICD-10-CM | POA: Diagnosis not present

## 2020-01-26 DIAGNOSIS — C7801 Secondary malignant neoplasm of right lung: Secondary | ICD-10-CM | POA: Insufficient documentation

## 2020-01-26 DIAGNOSIS — F17210 Nicotine dependence, cigarettes, uncomplicated: Secondary | ICD-10-CM | POA: Diagnosis not present

## 2020-01-26 DIAGNOSIS — C3490 Malignant neoplasm of unspecified part of unspecified bronchus or lung: Secondary | ICD-10-CM

## 2020-01-26 DIAGNOSIS — Z8521 Personal history of malignant neoplasm of larynx: Secondary | ICD-10-CM | POA: Insufficient documentation

## 2020-01-26 DIAGNOSIS — C3432 Malignant neoplasm of lower lobe, left bronchus or lung: Secondary | ICD-10-CM | POA: Insufficient documentation

## 2020-01-26 DIAGNOSIS — R911 Solitary pulmonary nodule: Secondary | ICD-10-CM

## 2020-01-26 DIAGNOSIS — Z923 Personal history of irradiation: Secondary | ICD-10-CM | POA: Diagnosis not present

## 2020-01-26 DIAGNOSIS — C329 Malignant neoplasm of larynx, unspecified: Secondary | ICD-10-CM

## 2020-01-26 NOTE — Progress Notes (Signed)
Radiation Oncology Follow up Note  Name: Philip Wood   Date:   01/26/2020 MRN:  [id number] DOB: [birth year]    This 74 y.o. male presents to the clinic today for 32-month follow-up status post SBRT to his right upper lobe for stage I non-small cell lung cancer.  REFERRING PROVIDER: Venita Lick, NP  HPI: Patient is a 74 year old male now out 10 months having completed SBRT to his right upper lobe for stage I non-small cell lung cancer.  He is also over 2 years out from SBRT to his left lower lobe.  He is seen today in routine follow-up and is doing well.  He specifically denies cough hemoptysis or chest tightness..  He had a CT scan this month which I have reviewed showing progressive posttreatment changes compatible external beam treatment in the right upper lobe.  The previous reference peripheral nodule is no longer identified and separate from these changes.  He also has stable appearance of central right upper lobe perihilar nodular densities.  He is currently under the care of cardiology for chronic systolic heart failure most likely due to the nonischemic cardiomyopathy for which he is stable.  COMPLICATIONS OF TREATMENT: none  FOLLOW UP COMPLIANCE: keeps appointments   PHYSICAL EXAM:  There were no vitals taken for this visit. Well-developed well-nourished patient in NAD. HEENT reveals PERLA, EOMI, discs not visualized.  Oral cavity is clear. No oral mucosal lesions are identified. Neck is clear without evidence of cervical or supraclavicular adenopathy. Lungs are clear to A&P. Cardiac examination is essentially unremarkable with regular rate and rhythm without murmur rub or thrill. Abdomen is benign with no organomegaly or masses noted. Motor sensory and DTR levels are equal and symmetric in the upper and lower extremities. Cranial nerves II through XII are grossly intact. Proprioception is intact. No peripheral adenopathy or edema is identified. No motor or sensory levels are  noted. Crude visual fields are within normal range.  RADIOLOGY RESULTS: CT scan reviewed compatible with above-stated findings  PLAN: Present time patient is doing well excellent result by SBRT criteria to his right upper lobe lesion.  I have asked to see him back in 6 months for follow-up with a CT scan at that time if is not already been performed.  After that if things are stable we will go to once year follow-up visits.  Patient comprehends my recommendations well.  I would like to take this opportunity to thank you for allowing me to participate in the care of your patient.[name], MD

## 2020-01-26 NOTE — Progress Notes (Signed)
Patient reports SOBr when first waking up in the morning that does improve.

## 2020-01-26 NOTE — Progress Notes (Signed)
Hematology/Oncology Follow up note [facility] [phone] Fax:([phone]   Patient Care Team: Venita Lick, NP as PCP - General (Nurse Practitioner) Wellington Hampshire, MD as PCP - Cardiology (Cardiology) Beverly Gust, MD (Otolaryngology) Telford Nab, RN as Registered Nurse [name], MD as Radiation Oncologist (Radiation Oncology)  REFERRING PROVIDER: Venita Lick, NP REASON FOR VISIT Follow up for lung nodules and presumed lung cancer s/p SBRT  HISTORY OF PRESENTING ILLNESS:  he has history of 31 pack a day smoking history,  #Patient had a history of 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  He got definitive radiation to the larynx, and the patient has been doing annual checkup with ENT Dr. Tami Ribas.  # 09/02/2017 ENB biopsy of both lung nodule. Pathology of biopsies showed no lesional cells identified. However he is a high risk patient and negative biopsy results do not exclude malignancy.  May 2019 Patient underwent SBRT to the left lung lesion.  # 02/25/2019 CT scan showed right upper lobe nodule interval increase in size.  #03/2019 he finished SBRT to right upper lobe lesion.    INTERVAL HISTORY Philip Wood is a 74 y.o. male who has above history reviewed by me today presents for follow-up visit for management of lung nodule for presumed lung cancer Chronic SOB with exertion.  No new complaint.  Review of Systems  Constitutional: Negative for appetite change, chills, fatigue, fever and unexpected weight change.  HENT:   Negative for hearing loss and voice change.   Eyes: Negative for eye problems and icterus.  Respiratory: Positive for shortness of breath. Negative for chest tightness and cough.   Cardiovascular: Negative for chest  pain and leg swelling.  Gastrointestinal: Negative for abdominal distention and abdominal pain.  Endocrine: Negative for hot flashes.  Genitourinary: Negative for difficulty urinating, dysuria and frequency.   Musculoskeletal: Negative for arthralgias.  Skin: Negative for itching and rash.  Neurological: Negative for light-headedness and numbness.  Hematological: Negative for adenopathy. Does not bruise/bleed easily.  Psychiatric/Behavioral: Negative for confusion.      MEDICAL HISTORY:  Past Medical History:  Diagnosis Date  . Cancer ([place])    laryngeal  . COPD (chronic obstructive pulmonary disease) ([place])   . Diabetes mellitus without complication ([place])   . GERD (gastroesophageal reflux disease)   . Hyperlipidemia   . Hypertension   . Lumbago   . Neuropathy   . Tobacco abuse disorder     SURGICAL HISTORY: Past Surgical History:  Procedure Laterality Date  . CHOLECYSTECTOMY    . ELECTROMAGNETIC NAVIGATION BROCHOSCOPY N/A 08/19/2017   Procedure: ELECTROMAGNETIC NAVIGATION BRONCHOSCOPY;  Surgeon: Flora Lipps, MD;  Location: [facility] ORS;  Service: Cardiopulmonary;  Laterality: N/A;  . ELECTROMAGNETIC NAVIGATION BROCHOSCOPY Left 09/02/2017   Procedure: ELECTROMAGNETIC NAVIGATION BRONCHOSCOPY;  Surgeon: Flora Lipps, MD;  Location: [facility] ORS;  Service: Cardiopulmonary;  Laterality: Left;  . THROAT SURGERY      SOCIAL HISTORY: Social History   Socioeconomic History  . Marital status: Married    Spouse name: Not on file  . Number of children: Not on file  . Years of education: Not on file  . Highest education level: 10th grade  Occupational History  . Not on file  Tobacco Use  . Smoking status: Light Tobacco Smoker    Packs/day: 0.60  Years: 53.00    Pack years: 31.80    Types: Cigarettes  . Smokeless tobacco: Never Used  . Tobacco comment: Aprrox 10 cigs/day  Vaping Use  . Vaping Use: Never used  Substance and Sexual Activity  . Alcohol use: No    Alcohol/week: 0.0  standard drinks  . Drug use: No  . Sexual activity: Not Currently  Other Topics Concern  . Not on file  Social History Narrative   Works part time at [employer] Strain:   . Difficulty of Paying Living Expenses: Not on file  Food Insecurity:   . Worried About Charity fundraiser in the Last Year: Not on file  . Ran Out of Food in the Last Year: Not on file  Transportation Needs:   . Lack of Transportation (Medical): Not on file  . Lack of Transportation (Non-Medical): Not on file  Physical Activity:   . Days of Exercise per Week: Not on file  . Minutes of Exercise per Session: Not on file  Stress:   . Feeling of Stress : Not on file  Social Connections:   . Frequency of Communication with Friends and Family: Not on file  . Frequency of Social Gatherings with Friends and Family: Not on file  . Attends Religious Services: Not on file  . Active Member of Clubs or Organizations: Not on file  . Attends Archivist Meetings: Not on file  . Marital Status: Not on file  Intimate Partner Violence:   . Fear of Current or Ex-Partner: Not on file  . Emotionally Abused: Not on file  . Physically Abused: Not on file  . Sexually Abused: Not on file    FAMILY HISTORY: Family History  Problem Relation Age of Onset  . Heart disease Father   . Heart attack Father   . Brain cancer Sister   . Cervical cancer Sister   . Rectal cancer Brother   . Lung cancer Brother   . Lung cancer Brother   . Lung cancer Brother   . Stomach cancer Sister   . Lung cancer Brother   . Stomach cancer Sister   . Diabetes Sister   . Lupus Sister     ALLERGIES:  has No Known Allergies.  MEDICATIONS:  Current Outpatient Medications  Medication Sig Dispense Refill  . allopurinol (ZYLOPRIM) 100 MG tablet Take 1 tablet (100 mg total) by mouth daily. 90 tablet 2  . aspirin 81 MG tablet Take 81 mg by mouth daily.    Marland Kitchen atorvastatin (LIPITOR) 80 MG tablet  Take 1 tablet (80 mg total) by mouth daily at 6 PM. 90 tablet 3  . metFORMIN (GLUCOPHAGE) 500 MG tablet Take 1 tablet (500 mg total) by mouth 2 (two) times daily with a meal. 180 tablet 1  . metoprolol succinate (TOPROL XL) 25 MG 24 hr tablet Take 1 tablet (25 mg total) by mouth daily. 90 tablet 3  . ramipril (ALTACE) 10 MG capsule Take 1 capsule (10 mg total) by mouth daily. 90 capsule 3  . tiotropium (SPIRIVA HANDIHALER) 18 MCG inhalation capsule Place 1 capsule (18 mcg total) into inhaler and inhale daily. 90 capsule 3   No current facility-administered medications for this visit.     PHYSICAL EXAMINATION: ECOG PERFORMANCE STATUS: 1 - Symptomatic but completely ambulatory Vitals:   01/26/20 1006  BP: (!) 154/82  Pulse: 61  Resp: 18  Temp: (!) 95.1 F (35.1 C)  Filed Weights   01/26/20 1006  Weight: 184 lb 9.6 oz (83.7 kg)  Physical Exam Constitutional:      General: He is not in acute distress.    Appearance: He is not diaphoretic.  HENT:     Head: Normocephalic and atraumatic.     Nose: Nose normal.     Mouth/Throat:     Pharynx: No oropharyngeal exudate.  Eyes:     General: No scleral icterus.    Pupils: Pupils are equal, round, and reactive to light.  Cardiovascular:     Rate and Rhythm: Normal rate and regular rhythm.     Heart sounds: No murmur heard.   Pulmonary:     Effort: Pulmonary effort is normal. No respiratory distress.     Breath sounds: No rales.  Chest:     Chest wall: No tenderness.  Abdominal:     General: There is no distension.     Palpations: Abdomen is soft.     Tenderness: There is no abdominal tenderness.  Musculoskeletal:        General: Normal range of motion.     Cervical back: Normal range of motion and neck supple.  Skin:    General: Skin is warm and dry.     Findings: No erythema.  Neurological:     Mental Status: He is alert and oriented to person, place, and time.     Cranial Nerves: No cranial nerve deficit.     Motor: No  abnormal muscle tone.     Coordination: Coordination normal.  Psychiatric:        Mood and Affect: Affect normal.           LABORATORY DATA:  I have reviewed the data as listed Lab Results  Component Value Date   WBC 5.8 07/12/2019   HGB 14.2 07/12/2019   HCT 42.2 07/12/2019   MCV 94.0 07/12/2019   PLT 143 (L) 07/12/2019   Recent Labs    02/28/19 0927 02/28/19 0927 05/24/19 0941 05/24/19 0941 07/12/19 0937 11/29/19 1006 01/24/20 0913  NA 139  --  145*  --  139 142  --   K 4.6   < > 4.8  --  4.2 5.3*  --   CL 105   < > 105  --  102 101  --   CO2 27   < > 26  --  29 27  --   GLUCOSE 108*  --  99  --  137* 94  --   BUN 16  --  10  --  16 14  --   CREATININE 1.24   < > 0.97   < > 1.20 1.04 1.20  CALCIUM 8.8*   < > 8.6  --  8.6* 9.2  --   GFRNONAA 57*   < > 77  --  60* 70  --   GFRAA >60   < > 89  --  >60 81  --   PROT 6.4*  --  5.7*  --  6.0*  --   --   ALBUMIN 3.5  --  4.0  --  3.4*  --   --   AST 14*  --  16  --  20  --   --   ALT 11  --  11  --  17  --   --   ALKPHOS 89  --  124*  --  90  --   --   BILITOT 0.7  --  0.4  --  0.7  --   --    < > = values in this interval not displayed.   RADIOGRAPHIC STUDIES: I have personally reviewed the radiological images as listed and agreed with the findings in the report. CT Chest W Contrast  Result Date: 01/24/2020 CLINICAL DATA:  Restaging lung cancer. Status post SBRP of right upper lobe nodule EXAM: CT CHEST WITH CONTRAST TECHNIQUE: Multidetector CT imaging of the chest was performed during intravenous contrast administration. CONTRAST:  91mL OMNIPAQUE IOHEXOL 300 MG/ML  SOLN COMPARISON:  07/26/2019. FINDINGS: Cardiovascular: Normal heart size. No pericardial effusion identified. Aortic atherosclerosis. Coronary artery calcifications. No central obstructing pulmonary embolus identified. Mediastinum/Nodes: Normal appearance of the thyroid gland. The trachea appears patent and is midline. Normal appearance of the esophagus.  No enlarged supraclavicular, axillary, mediastinal, or hilar lymph nodes. Lungs/Pleura: Centrilobular emphysema. No pleural effusion, atelectasis or pneumothorax. Scarring and architectural distortion within the anterior lateral left lower lobe is again noted and is presumed to represent post treatment changes. Similarly, there is a progressive bandlike area of architectural distortion, ground-glass attenuation, and scarring within the right upper lobe compatible with changes secondary to external beam radiation. The previous cavitary lesion within the anterior right upper lobe is no longer visualized separate from these changes. Similarly, the previously referenced 6 mm nodular density within the right lung apex which is no longer visualized separate from changes due to external beam radiation. The previously referenced right upper lobe perihilar nodule is unchanged measuring 1.4 x 0.8 cm, image 60/3. Upper Abdomen: Chronic right renal atrophy. Previous cholecystectomy. Similar appearance of numerous small hypodensities scattered throughout the spleen. No acute abnormality identified within the imaged portions of the upper abdomen. Musculoskeletal: No chest wall abnormality. No acute or significant osseous findings. IMPRESSION: 1. Progressive post treatment changes compatible with external beam radiation noted within the right upper lobe. The previously referenced peripheral nodular densities within the right upper lobe are no longer identified separate from these changes. 2. Stable appearance of central right upper lobe perihilar nodular density. 3. Similar appearance of post treatment change within the superior segment of left lower lobe. 4. Coronary artery calcifications. Aortic Atherosclerosis (ICD10-I70.0) and Emphysema (ICD10-J43.9). Electronically Signed   By: Kerby Moors M.D.   On: 01/24/2020 09:44     ASSESSMENT & PLAN:  1. Solitary pulmonary nodule   2. Squamous cell carcinoma of larynx (HCC)     Presumed lung cancer, status post SBRT of left lung nodule in 2019 and SBRP of right upper lobe nodule . Images were reviewed by me and discussed with patient. No evidence of disease progression or recurrence.  Continue follow-up with radiation oncology.  Recommend patient to have CT images every 6 months for surveillance. He has appointment with Dr. Baruch Gouty today. .  Smoke cessation discussed.  # Squamous cell carcinoma of larynx, recommend he continue to have annual check up with ENT Dr.McQueen.   No need to continue follow-up with me at this point. Recommend patient continue follow-up with radiation oncology and continued image surveillance.  I am happy to see him again in future if he develops disease progression. All questions were answered.  We spent sufficient time to discuss many aspect of care, questions were answered to patient's satisfaction.  Earlie Server, MD, PhD Hematology Oncology [facility] at [facility] Pager- [phone] 01/26/2020

## 2020-02-02 ENCOUNTER — Ambulatory Visit: Payer: Medicare Other | Admitting: Radiation Oncology

## 2020-02-17 ENCOUNTER — Other Ambulatory Visit: Payer: Self-pay | Admitting: Nurse Practitioner

## 2020-02-27 ENCOUNTER — Other Ambulatory Visit: Payer: Self-pay | Admitting: Nurse Practitioner

## 2020-02-27 NOTE — Telephone Encounter (Signed)
Requested Prescriptions  Pending Prescriptions Disp Refills  . SPIRIVA HANDIHALER 18 MCG inhalation capsule [Pharmacy Med Name: Spiriva HandiHaler 18 MCG Inhalation Capsule] 90 capsule 0    Sig: PLACE 1 CAPSULE INTO INHALER AND INHALE ITS CONTENTS ONCE DAILY.     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 3a chronic kidney disease, without long-term current use of insulin ([place])   [place], [place] T, NP   1 year ago Diabetes mellitus due to underlying condition with chronic kidney disease, without long-term current use of insulin, unspecified CKD stage ([place])   [place], Jolene T, NP   1 year ago Diabetes mellitus due to underlying condition with chronic kidney disease, without long-term current use of insulin, unspecified CKD stage ([place])   [place], Jolene T, NP   2 years ago CKD (chronic kidney disease), stage III ([place])   [place] Kathrine Haddock, NP      Future Appointments            In 3 months Cannady, [name], NP [facility]   In 35 months  [facility]

## 2020-03-27 ENCOUNTER — Other Ambulatory Visit: Payer: Self-pay | Admitting: Nurse Practitioner

## 2020-05-15 ENCOUNTER — Other Ambulatory Visit: Payer: Self-pay | Admitting: Nurse Practitioner

## 2020-05-25 ENCOUNTER — Encounter: Payer: Self-pay | Admitting: Nurse Practitioner

## 2020-05-25 DIAGNOSIS — I700 Atherosclerosis of aorta: Secondary | ICD-10-CM | POA: Insufficient documentation

## 2020-05-31 ENCOUNTER — Other Ambulatory Visit: Payer: Self-pay

## 2020-05-31 ENCOUNTER — Encounter: Payer: Self-pay | Admitting: Nurse Practitioner

## 2020-05-31 ENCOUNTER — Ambulatory Visit (INDEPENDENT_AMBULATORY_CARE_PROVIDER_SITE_OTHER): Payer: Medicare Other | Admitting: Nurse Practitioner

## 2020-05-31 VITALS — BP 118/72 | HR 76 | Temp 97.6°F | Ht 69.0 in | Wt 181.2 lb

## 2020-05-31 DIAGNOSIS — E1169 Type 2 diabetes mellitus with other specified complication: Secondary | ICD-10-CM | POA: Diagnosis not present

## 2020-05-31 DIAGNOSIS — M109 Gout, unspecified: Secondary | ICD-10-CM

## 2020-05-31 DIAGNOSIS — C3490 Malignant neoplasm of unspecified part of unspecified bronchus or lung: Secondary | ICD-10-CM | POA: Diagnosis not present

## 2020-05-31 DIAGNOSIS — E538 Deficiency of other specified B group vitamins: Secondary | ICD-10-CM

## 2020-05-31 DIAGNOSIS — F17219 Nicotine dependence, cigarettes, with unspecified nicotine-induced disorders: Secondary | ICD-10-CM

## 2020-05-31 DIAGNOSIS — N1831 Chronic kidney disease, stage 3a: Secondary | ICD-10-CM | POA: Diagnosis not present

## 2020-05-31 DIAGNOSIS — D510 Vitamin B12 deficiency anemia due to intrinsic factor deficiency: Secondary | ICD-10-CM | POA: Diagnosis not present

## 2020-05-31 DIAGNOSIS — I5022 Chronic systolic (congestive) heart failure: Secondary | ICD-10-CM

## 2020-05-31 DIAGNOSIS — E0822 Diabetes mellitus due to underlying condition with diabetic chronic kidney disease: Secondary | ICD-10-CM

## 2020-05-31 DIAGNOSIS — E785 Hyperlipidemia, unspecified: Secondary | ICD-10-CM | POA: Diagnosis not present

## 2020-05-31 DIAGNOSIS — I130 Hypertensive heart and chronic kidney disease with heart failure and stage 1 through stage 4 chronic kidney disease, or unspecified chronic kidney disease: Secondary | ICD-10-CM | POA: Diagnosis not present

## 2020-05-31 DIAGNOSIS — N183 Chronic kidney disease, stage 3 unspecified: Secondary | ICD-10-CM | POA: Diagnosis not present

## 2020-05-31 DIAGNOSIS — I700 Atherosclerosis of aorta: Secondary | ICD-10-CM | POA: Diagnosis not present

## 2020-05-31 DIAGNOSIS — J432 Centrilobular emphysema: Secondary | ICD-10-CM

## 2020-05-31 LAB — MICROALBUMIN, URINE WAIVED
Creatinine, Urine Waived: 200 mg/dL (ref 10–300)
Microalb, Ur Waived: 80 mg/L — ABNORMAL HIGH (ref 0–19)

## 2020-05-31 LAB — BAYER DCA HB A1C WAIVED: HB A1C (BAYER DCA - WAIVED): 5.6 % (ref <7.0)

## 2020-05-31 MED ORDER — RAMIPRIL 10 MG PO CAPS: 10.0000 mg | ORAL_CAPSULE | Freq: Every day | ORAL | 4 refills | Status: AC

## 2020-05-31 MED ORDER — METFORMIN HCL 500 MG PO TABS: ORAL_TABLET | ORAL | 4 refills | Status: DC

## 2020-05-31 MED ORDER — ATORVASTATIN CALCIUM 80 MG PO TABS: 80.0000 mg | ORAL_TABLET | Freq: Every day | ORAL | 4 refills | Status: AC

## 2020-05-31 MED ORDER — SPIRIVA HANDIHALER 18 MCG IN CAPS: ORAL_CAPSULE | RESPIRATORY_TRACT | 4 refills | Status: AC

## 2020-05-31 MED ORDER — METOPROLOL SUCCINATE ER 25 MG PO TB24: 25.0000 mg | ORAL_TABLET | Freq: Every day | ORAL | 4 refills | Status: AC

## 2020-05-31 MED ORDER — ALLOPURINOL 100 MG PO TABS: 100.0000 mg | ORAL_TABLET | Freq: Every day | ORAL | 4 refills | Status: AC

## 2020-05-31 NOTE — Patient Instructions (Signed)
Diabetes Mellitus and Nutrition, Adult When you have diabetes, or diabetes mellitus, it is very important to have healthy eating habits because your blood sugar (glucose) levels are greatly affected by what you eat and drink. Eating healthy foods in the right amounts, at about the same times every day, can help you:  Control your blood glucose.  Lower your risk of heart disease.  Improve your blood pressure.  Reach or maintain a healthy weight. What can affect my meal plan? Every person with diabetes is different, and each person has different needs for a meal plan. Your health care provider may recommend that you work with a dietitian to make a meal plan that is best for you. Your meal plan may vary depending on factors such as:  The calories you need.  The medicines you take.  Your weight.  Your blood glucose, blood pressure, and cholesterol levels.  Your activity level.  Other health conditions you have, such as heart or kidney disease. How do carbohydrates affect me? Carbohydrates, also called carbs, affect your blood glucose level more than any other type of food. Eating carbs naturally raises the amount of glucose in your blood. Carb counting is a method for keeping track of how many carbs you eat. Counting carbs is important to keep your blood glucose at a healthy level, especially if you use insulin or take certain oral diabetes medicines. It is important to know how many carbs you can safely have in each meal. This is different for every person. Your dietitian can help you calculate how many carbs you should have at each meal and for each snack. How does alcohol affect me? Alcohol can cause a sudden decrease in blood glucose (hypoglycemia), especially if you use insulin or take certain oral diabetes medicines. Hypoglycemia can be a life-threatening condition. Symptoms of hypoglycemia, such as sleepiness, dizziness, and confusion, are similar to symptoms of having too much  alcohol.  Do not drink alcohol if: ? Your health care provider tells you not to drink. ? You are pregnant, may be pregnant, or are planning to become pregnant.  If you drink alcohol: ? Do not drink on an empty stomach. ? Limit how much you use to:  0-1 drink a day for women.  0-2 drinks a day for men. ? Be aware of how much alcohol is in your drink. In the [place] one drink equals one 12 oz bottle of beer (355 mL), one 5 oz glass of wine (148 mL), or one 1 oz glass of hard liquor (44 mL). ? Keep yourself hydrated with water, diet soda, or unsweetened iced tea.  Keep in mind that regular soda, juice, and other mixers may contain a lot of sugar and must be counted as carbs. What are tips for following this plan? Reading food labels  Start by checking the serving size on the "Nutrition Facts" label of packaged foods and drinks. The amount of calories, carbs, fats, and other nutrients listed on the label is based on one serving of the item. Many items contain more than one serving per package.  Check the total grams (g) of carbs in one serving. You can calculate the number of servings of carbs in one serving by dividing the total carbs by 15. For example, if a food has 30 g of total carbs per serving, it would be equal to 2 servings of carbs.  Check the number of grams (g) of saturated fats and trans fats in one serving. Choose foods that have   a low amount or none of these fats.  Check the number of milligrams (mg) of salt (sodium) in one serving. Most people should limit total sodium intake to less than 2,300 mg per day.  Always check the nutrition information of foods labeled as "low-fat" or "nonfat." These foods may be higher in added sugar or refined carbs and should be avoided.  Talk to your dietitian to identify your daily goals for nutrients listed on the label. Shopping  Avoid buying canned, pre-made, or processed foods. These foods tend to be high in fat, sodium, and added  sugar.  Shop around the outside edge of the grocery store. This is where you will most often find fresh fruits and vegetables, bulk grains, fresh meats, and fresh dairy. Cooking  Use low-heat cooking methods, such as baking, instead of high-heat cooking methods like deep frying.  Cook using healthy oils, such as olive, canola, or sunflower oil.  Avoid cooking with butter, cream, or high-fat meats. Meal planning  Eat meals and snacks regularly, preferably at the same times every day. Avoid going long periods of time without eating.  Eat foods that are high in fiber, such as fresh fruits, vegetables, beans, and whole grains. Talk with your dietitian about how many servings of carbs you can eat at each meal.  Eat 4-6 oz (112-168 g) of lean protein each day, such as lean meat, chicken, fish, eggs, or tofu. One ounce (oz) of lean protein is equal to: ? 1 oz (28 g) of meat, chicken, or fish. ? 1 egg. ?  cup (62 g) of tofu.  Eat some foods each day that contain healthy fats, such as avocado, nuts, seeds, and fish.   What foods should I eat? Fruits Berries. Apples. Oranges. Peaches. Apricots. Plums. Grapes. Mango. Papaya. Pomegranate. Kiwi. Cherries. Vegetables Lettuce. Spinach. Leafy greens, including kale, chard, collard greens, and mustard greens. Beets. Cauliflower. Cabbage. Broccoli. Carrots. Green beans. Tomatoes. Peppers. Onions. Cucumbers. Brussels sprouts. Grains Whole grains, such as whole-wheat or whole-grain bread, crackers, tortillas, cereal, and pasta. Unsweetened oatmeal. Quinoa. Brown or wild rice. Meats and other proteins Seafood. Poultry without skin. Lean cuts of poultry and beef. Tofu. Nuts. Seeds. Dairy Low-fat or fat-free dairy products such as milk, yogurt, and cheese. The items listed above may not be a complete list of foods and beverages you can eat. Contact a dietitian for more information. What foods should I avoid? Fruits Fruits canned with  syrup. Vegetables Canned vegetables. Frozen vegetables with butter or cream sauce. Grains Refined white flour and flour products such as bread, pasta, snack foods, and cereals. Avoid all processed foods. Meats and other proteins Fatty cuts of meat. Poultry with skin. Breaded or fried meats. Processed meat. Avoid saturated fats. Dairy Full-fat yogurt, cheese, or milk. Beverages Sweetened drinks, such as soda or iced tea. The items listed above may not be a complete list of foods and beverages you should avoid. Contact a dietitian for more information. Questions to ask a health care provider  Do I need to meet with a diabetes educator?  Do I need to meet with a dietitian?  What number can I call if I have questions?  When are the best times to check my blood glucose? Where to find more information:  American Diabetes Association: diabetes.org  Academy of Nutrition and Dietetics: www.eatright.org  National Institute of Diabetes and Digestive and Kidney Diseases: www.niddk.nih.gov  Association of Diabetes Care and Education Specialists: www.diabeteseducator.org Summary  It is important to have healthy eating   habits because your blood sugar (glucose) levels are greatly affected by what you eat and drink.  A healthy meal plan will help you control your blood glucose and maintain a healthy lifestyle.  Your health care provider may recommend that you work with a dietitian to make a meal plan that is best for you.  Keep in mind that carbohydrates (carbs) and alcohol have immediate effects on your blood glucose levels. It is important to count carbs and to use alcohol carefully. This information is not intended to replace advice given to you by your health care provider. Make sure you discuss any questions you have with your health care provider. Document Revised: 03/15/2019 Document Reviewed: 03/15/2019 Elsevier Patient Education  2021 Elsevier Inc.  

## 2020-05-31 NOTE — Assessment & Plan Note (Addendum)
Chronic, ongoing with initial elevation in BP today and repeat at goal.  Home BP at goal.  Continue current medication regimen and collaboration with cardiology.  BMP and TSH today.  Recommend focus on DASH diet at home and continue to monitor BP and weight closely. Refills sent in.  Return in 6 months.

## 2020-05-31 NOTE — Assessment & Plan Note (Signed)
Chronic, ongoing.  A1C 5.6% today, remaining very stable.  Urine ALB 80 and A:C 30-300 today, continue Ramipril for kidney protection.  Continue current medication regimen and adjust as needed.  Recommend he check BS at home at least a few mornings a week.  Consider reduction of medication next visit, if remains stable.  BMP today.  Medication refills sent.  Return in 6 months.

## 2020-05-31 NOTE — Assessment & Plan Note (Signed)
Continue collaboration with oncology.  Recommend complete cessation of smoking.

## 2020-05-31 NOTE — Assessment & Plan Note (Signed)
Chronic, ongoing.  Continue current medication regimen.  Recommend complete smoking cessation.  Consider addition of Albuterol at next visit as rescue inhaler, he refuses this being sent in today.  At this time he denies symptoms.  Plan on spirometry next visit, discussed with patient.

## 2020-05-31 NOTE — Assessment & Plan Note (Signed)
Noted on past CT imaging of lungs.  Recommend to continue statin and ASA daily for prevention.  Recommend complete cessation of smoking.

## 2020-05-31 NOTE — Progress Notes (Signed)
BP 118/72   Pulse 76   Temp 97.6 F (36.4 C) (Oral)   Ht 5\' 9"  (1.753 m)   Wt 181 lb 3.2 oz (82.2 kg)   SpO2 99%   BMI 26.76 kg/m    Subjective:    Patient ID: Philip Wood, male    DOB: 07-08-1945, 75 y.o.   MRN: [id number]  HPI: Philip Wood is a 75 y.o. male  Chief Complaint  Patient presents with  . Diabetes  . Hyperlipidemia  . Hypertension   DIABETES Continues on Metformin 500 MG BID.  Last A1C was 5.5% last check. Hypoglycemic episodes:no Polydipsia/polyuria: no Visual disturbance: no Chest pain: no Paresthesias: no Glucose Monitoring: no  Accucheck frequency: Not Checking  Fasting glucose:  Post prandial:  Evening:  Before meals: Taking Insulin?: no  Long acting insulin:  Short acting insulin: Blood Pressure Monitoring: a few times a week Retinal Examination: Not up to Date - missed it last year Foot Exam: Up to Date Pneumovax: Up to Date Influenza: Up to Date Aspirin: yes   HYPERTENSION / HYPERLIPIDEMIA/HF Continues Ramipril, Metoprolol, and Atorvastatin + ASA.  Is followed by cardiology and last saw in March 2021, [name] -- returns to see in March 2022.  Last echo EF 45-50% in 2019.   States weight at home is staying around 180-182 lbs.  Satisfied with current treatment? yes Duration of hypertension: chronic BP monitoring frequency: a few times a week BP range: 120/70's average --- on Monday 128/82 BP medication side effects: no Duration of hyperlipidemia: chronic Cholesterol medication side effects: no Cholesterol supplements: none Medication compliance: good compliance Aspirin: yes Recent stressors: no Recurrent headaches: no Visual changes: no Palpitations: no Dyspnea: baseline, no worse Chest pain: no Lower extremity edema: no Dizzy/lightheaded: no   LUNG CANCER (Right upper lobe nodule ): Followed by oncology and received radiation treatment, no further treatments at this time.  Last saw Dr. Baruch Gouty and Dr. Tasia Catchings on  01/26/20, sees every 6 months. Smokes more than 1/2 PPD, he tried to quit but was unsuccessful.  Is not interested in quitting at this time.  Last followed by pulmonary in 2019, Dr. Mortimer Fries, for COPD.  Last CT scan in October 2021 noting centrilobular emphysema and aortic atherosclerosis. COPD status: stable Satisfied with current treatment?: yes Oxygen use: no Dyspnea frequency: occasional Cough frequency: occasional Rescue inhaler frequency: does not have Limitation of activity: no Productive cough: none Last Spirometry: 2019 Pneumovax: Up to Date Influenza: Up to Date  Relevant past medical, surgical, family and social history reviewed and updated as indicated. Interim medical history since our last visit reviewed. Allergies and medications reviewed and updated.  Review of Systems  Constitutional: Negative for activity change, diaphoresis, fatigue and fever.  Respiratory: Negative for cough, chest tightness, shortness of breath and wheezing.   Cardiovascular: Negative for chest pain, palpitations and leg swelling.  Gastrointestinal: Negative.   Endocrine: Negative for cold intolerance, heat intolerance, polydipsia, polyphagia and polyuria.  Neurological: Negative.   Psychiatric/Behavioral: Negative.     Per HPI unless specifically indicated above     Objective:    BP 118/72   Pulse 76   Temp 97.6 F (36.4 C) (Oral)   Ht 5\' 9"  (1.753 m)   Wt 181 lb 3.2 oz (82.2 kg)   SpO2 99%   BMI 26.76 kg/m   Wt Readings from Last 3 Encounters:  05/31/20 181 lb 3.2 oz (82.2 kg)  01/26/20 184 lb 9.6 oz (83.7 kg)  12/29/19 182  lb 2 oz (82.6 kg)    Physical Exam Vitals and nursing note reviewed.  Constitutional:      General: He is awake. He is not in acute distress.    Appearance: He is well-developed and well-groomed. He is not ill-appearing.  HENT:     Head: Normocephalic and atraumatic.     Right Ear: Hearing normal. No drainage.     Left Ear: Hearing normal. No drainage.  Eyes:      General: Lids are normal.        Right eye: No discharge.        Left eye: No discharge.     Conjunctiva/sclera: Conjunctivae normal.     Pupils: Pupils are equal, round, and reactive to light.  Cardiovascular:     Rate and Rhythm: Normal rate and regular rhythm.     Heart sounds: Normal heart sounds, S1 normal and S2 normal. No murmur heard. No gallop.   Pulmonary:     Effort: Pulmonary effort is normal. No accessory muscle usage or respiratory distress.     Breath sounds: Decreased breath sounds and wheezing present.     Comments: Decreased sounds throughout with expiratory wheezes per baseline. Abdominal:     General: Bowel sounds are normal.     Palpations: Abdomen is soft.  Musculoskeletal:        General: Normal range of motion.     Cervical back: Normal range of motion and neck supple.     Right lower leg: No edema.     Left lower leg: No edema.  Skin:    General: Skin is warm and dry.  Neurological:     Mental Status: He is alert and oriented to person, place, and time.  Psychiatric:        Mood and Affect: Mood normal.        Behavior: Behavior normal. Behavior is cooperative.        Thought Content: Thought content normal.        Judgment: Judgment normal.    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  Home BP at goal.   Continue current medication regimen and collaboration with cardiology.  BMP and TSH today.  Recommend focus on DASH diet at home and continue to monitor BP and weight closely. Refills sent in.  Return in 6 months.      Relevant Medications   atorvastatin (LIPITOR) 80 MG tablet   metoprolol succinate (TOPROL XL) 25 MG 24 hr tablet   ramipril (ALTACE) 10 MG capsule   Other Relevant Orders   Basic Metabolic Panel (BMET)   Bayer DCA Hb A1c Waived (STAT)   Microalbumin, Urine Waived   TSH   Chronic systolic heart failure (HCC)    Chronic, ongoing.  Euvolemic today. . Continue current medication regimen and collaboration with cardiology.  BMP today.  Recommend: - Reminded to call for an overnight weight gain of >2 pounds or a weekly weight weight of >5 pounds - not adding salt to his food and has been reading food labels. Reviewed the importance of keeping daily sodium intake to 2000mg  daily       Relevant Medications   atorvastatin (LIPITOR) 80 MG tablet   metoprolol succinate (TOPROL XL) 25 MG 24 hr tablet   ramipril (ALTACE) 10 MG capsule   Aortic atherosclerosis (HCC)    Noted on past CT imaging of lungs.  Recommend to continue statin and ASA daily for prevention.  Recommend complete cessation of smoking.      Relevant Medications   atorvastatin (LIPITOR) 80 MG tablet   metoprolol succinate (TOPROL XL) 25 MG 24 hr tablet   ramipril (ALTACE) 10 MG capsule     Respiratory   Centrilobular emphysema (HCC)    Chronic, ongoing.  Continue current medication regimen.  Recommend complete smoking cessation.  Consider addition of Albuterol at next visit as rescue inhaler, he refuses this being sent in today.  At this time he denies symptoms.  Plan on spirometry next visit, discussed with patient.      Relevant Medications   tiotropium (SPIRIVA HANDIHALER) 18 MCG inhalation capsule   Lung cancer ([place])    Continue collaboration with oncology.  Recommend complete cessation of smoking.       Relevant Medications   allopurinol (ZYLOPRIM) 100 MG tablet     Endocrine   Diabetes mellitus with chronic kidney disease ([place]) - Primary    Chronic, ongoing.  A1C 5.6% today, remaining very stable.  Urine ALB 80 and A:C 30-300 today, continue Ramipril for kidney protection.  Continue current medication regimen and adjust as needed.  Recommend he check BS at home at least a few mornings a week.  Consider reduction of medication next visit, if remains stable.  BMP today.  Medication refills sent.  Return in 6 months.      Relevant Medications   atorvastatin (LIPITOR) 80 MG tablet   metFORMIN (GLUCOPHAGE) 500 MG tablet   ramipril (ALTACE) 10 MG capsule   Other Relevant Orders   Bayer DCA Hb A1c Waived (STAT)   Hyperlipidemia associated with type 2 diabetes mellitus (HCC)    Chronic, ongoing.  Continue current medication regimen and adjust as needed.  Lipid panel today.  Refills sent in.      Relevant Medications   atorvastatin (LIPITOR) 80 MG tablet   metFORMIN (GLUCOPHAGE) 500 MG tablet   ramipril (ALTACE) 10 MG capsule   Other Relevant Orders   Bayer DCA Hb A1c Waived (STAT)   Lipid Profile     Nervous and Auditory   Nicotine dependence, cigarettes, w unsp disorders    I have recommended complete cessation of tobacco use. I have discussed various options available for assistance with tobacco cessation including over the counter methods (Nicotine gum, patch and lozenges). We also discussed prescription options (Chantix, Nicotine Inhaler / Nasal Spray). The patient is not interested in pursuing any prescription tobacco cessation options at this time.         Genitourinary   CKD (chronic kidney disease), stage III ([place])    Recently has been improved on labs since 02/2019.  Continue to monitor closely for decline.  Continue Ramipril for protection.        Other   Gout    Chronic, stable with Allopurinol.  Continue current medication regimen and adjust as needed.  Uric acid  level next visit.  Refills sent.       Other Visit Diagnoses    B12 deficiency       History of low levels and on chronic Metformin, check level today and start supplement as needed.   Relevant Orders   Vitamin B12       Follow up plan: Return in about 6 months (around 11/28/2020) for T2DM, HTN/HLD, COPD -- needs spirometry.

## 2020-05-31 NOTE — Assessment & Plan Note (Addendum)
Chronic, ongoing.  Euvolemic today. . Continue current medication regimen and collaboration with cardiology.  BMP today.  Recommend: - Reminded to call for an overnight weight gain of >2 pounds or a weekly weight weight of >5 pounds - not adding salt to his food and has been reading food labels. Reviewed the importance of keeping daily sodium intake to 2000mg  daily

## 2020-05-31 NOTE — Assessment & Plan Note (Signed)
Chronic, stable with Allopurinol.  Continue current medication regimen and adjust as needed.  Uric acid level next visit.  Refills sent.

## 2020-05-31 NOTE — Assessment & Plan Note (Signed)
Recently has been improved on labs since 02/2019.  Continue to monitor closely for decline.  Continue Ramipril for protection.

## 2020-05-31 NOTE — Assessment & Plan Note (Signed)
I have recommended complete cessation of tobacco use. I have discussed various options available for assistance with tobacco cessation including over the counter methods (Nicotine gum, patch and lozenges). We also discussed prescription options (Chantix, Nicotine Inhaler / Nasal Spray). The patient is not interested in pursuing any prescription tobacco cessation options at this time.  

## 2020-05-31 NOTE — Assessment & Plan Note (Addendum)
Chronic, ongoing.  Continue current medication regimen and adjust as needed.  Lipid panel today.  Refills sent in.

## 2020-06-01 ENCOUNTER — Encounter: Payer: Self-pay | Admitting: Nurse Practitioner

## 2020-06-01 DIAGNOSIS — E538 Deficiency of other specified B group vitamins: Secondary | ICD-10-CM | POA: Insufficient documentation

## 2020-06-01 LAB — BASIC METABOLIC PANEL
BUN/Creatinine Ratio: 14 (ref 10–24)
BUN: 16 mg/dL (ref 8–27)
CO2: 26 mmol/L (ref 20–29)
Calcium: 9 mg/dL (ref 8.6–10.2)
Chloride: 101 mmol/L (ref 96–106)
Creatinine, Ser: 1.15 mg/dL (ref 0.76–1.27)
GFR calc Af Amer: 72 mL/min/{1.73_m2} (ref >59)
GFR calc non Af Amer: 62 mL/min/{1.73_m2} (ref >59)
Glucose: 96 mg/dL (ref 65–99)
Potassium: 4.8 mmol/L (ref 3.5–5.2)
Sodium: 141 mmol/L (ref 134–144)

## 2020-06-01 LAB — TSH: TSH: 2.89 u[IU]/mL (ref 0.450–4.500)

## 2020-06-01 LAB — LIPID PANEL
Chol/HDL Ratio: 4.3 ratio (ref 0.0–5.0)
Cholesterol, Total: 155 mg/dL (ref 100–199)
HDL: 36 mg/dL — ABNORMAL LOW (ref >39)
LDL Chol Calc (NIH): 95 mg/dL (ref 0–99)
Triglycerides: 132 mg/dL (ref 0–149)
VLDL Cholesterol Cal: 24 mg/dL (ref 5–40)

## 2020-06-01 LAB — VITAMIN B12: Vitamin B-12: 235 pg/mL (ref 232–1245)

## 2020-06-01 NOTE — Progress Notes (Signed)
Please let [name] know his labs have returned and overall are remaining stable, with exception of B12 level which is low.  I would recommend he start taking Vitamin B12 1000 MCG daily, which he can obtain in any vitamin section.  This is good for nervous system health, especially with underlying long term Metformin use.  If any questions please let me know.  Have a great day!! Keep being awesome!!  Thank you for allowing me to participate in your care. Kindest regards, Harmoni Lucus

## 2020-07-05 ENCOUNTER — Encounter: Payer: Self-pay | Admitting: Nurse Practitioner

## 2020-07-30 ENCOUNTER — Ambulatory Visit
Admission: RE | Admit: 2020-07-30 | Discharge: 2020-07-30 | Disposition: A | Discharge status: Home / Self Care | Payer: Medicare Other | Source: Ambulatory Visit | Attending: Radiation Oncology | Admitting: Radiation Oncology

## 2020-07-30 ENCOUNTER — Other Ambulatory Visit: Payer: Self-pay

## 2020-07-30 DIAGNOSIS — I700 Atherosclerosis of aorta: Secondary | ICD-10-CM | POA: Diagnosis not present

## 2020-07-30 DIAGNOSIS — I2510 Atherosclerotic heart disease of native coronary artery without angina pectoris: Secondary | ICD-10-CM | POA: Diagnosis not present

## 2020-07-30 DIAGNOSIS — C3411 Malignant neoplasm of upper lobe, right bronchus or lung: Secondary | ICD-10-CM | POA: Diagnosis not present

## 2020-07-30 DIAGNOSIS — C3490 Malignant neoplasm of unspecified part of unspecified bronchus or lung: Secondary | ICD-10-CM | POA: Insufficient documentation

## 2020-07-30 LAB — POCT I-STAT CREATININE: Creatinine, Ser: 1.1 mg/dL (ref 0.61–1.24)

## 2020-07-30 IMAGING — CT CT CHEST W/ CM
2 of 3 series · 15 of 36 positions shown, 18 images · IV contrast (omnipaque)
Comparison: CT [DATE]

CLINICAL DATA: Small cell lung cancer monitoring. Status post SB RT
for RIGHT upper lobe stage I non-small lung cancer.

EXAM:
CT CHEST WITH CONTRAST
TECHNIQUE: Multidetector CT imaging of the chest was performed during
intravenous contrast administration.
CONTRAST:  75mL OMNIPAQUE IOHEXOL 300 MG/ML  SOLN

[Series 2: axial st · axial · 0.74mm/px · z∈[-317,-43]mm · 12 of 161 slices shown, 15 images]
[im 12/161  mediastinal]
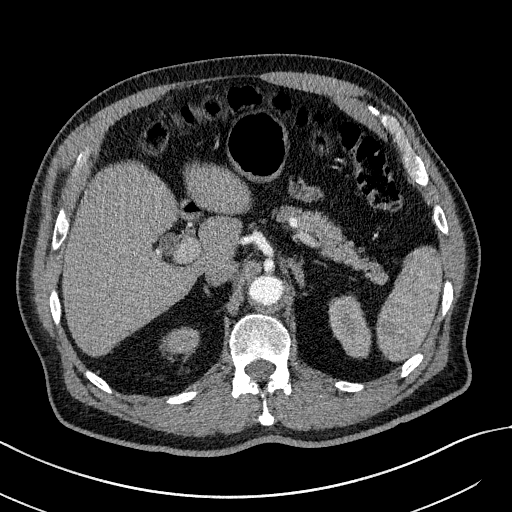
[im 12/161  lung]
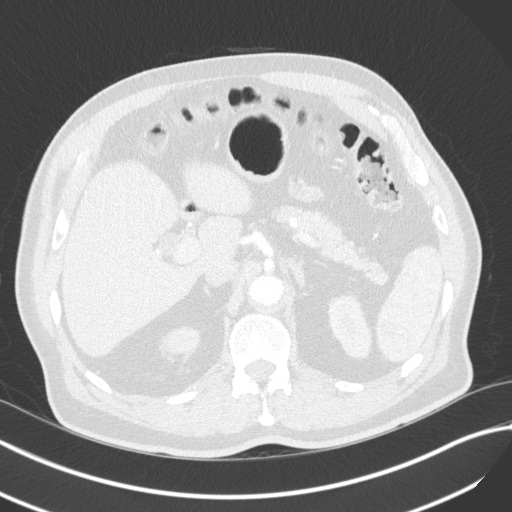
[im 24/161  lung]
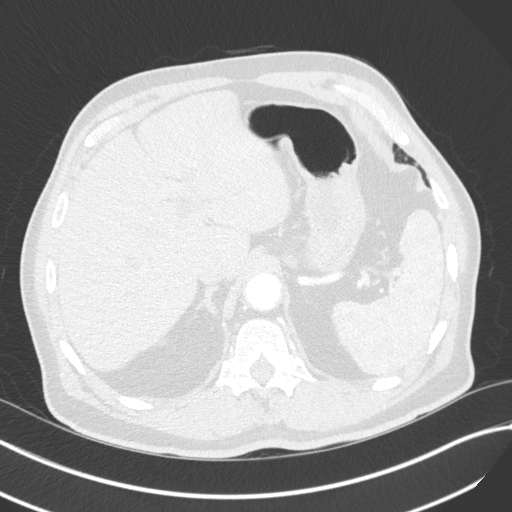
[im 36/161  lung]
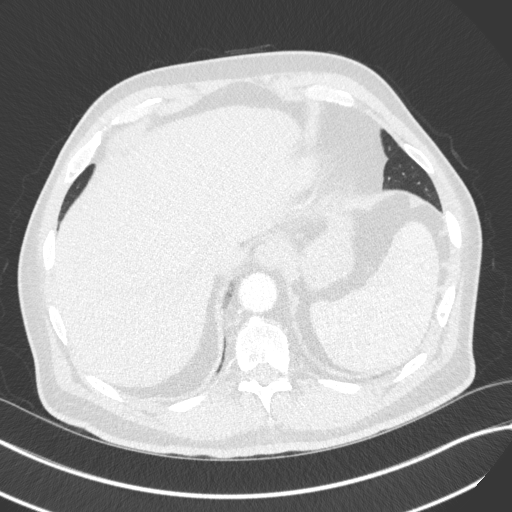
[im 48/161  lung]
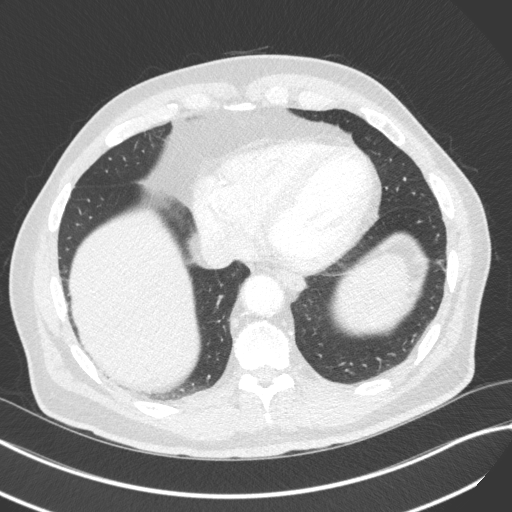
[im 60/161  mediastinal]
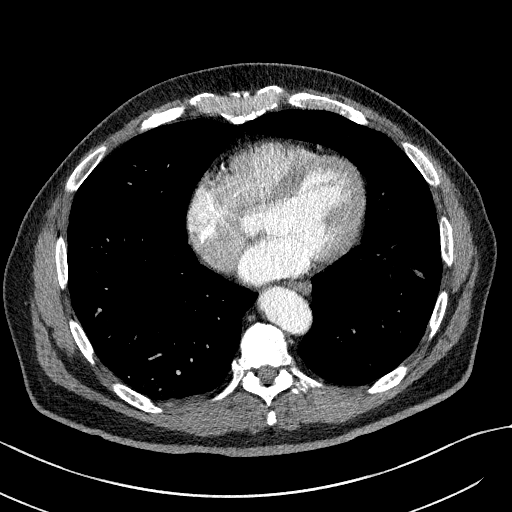
[im 60/161  lung]
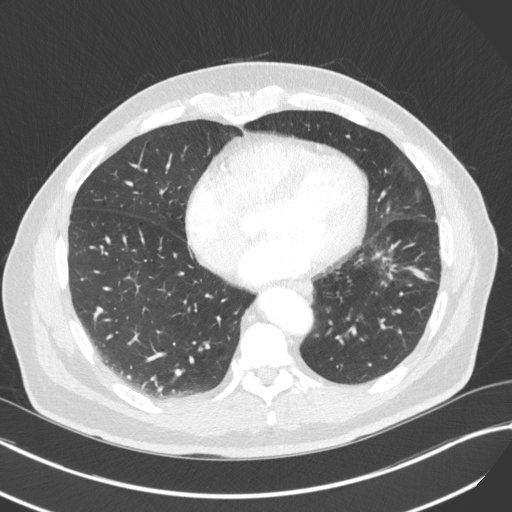
[im 72/161  lung]
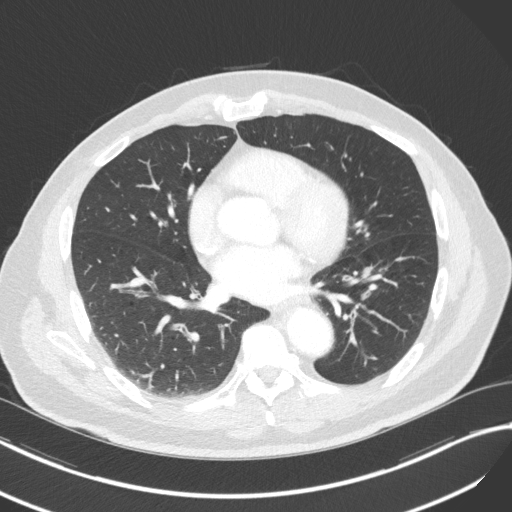
[im 89/161  lung]
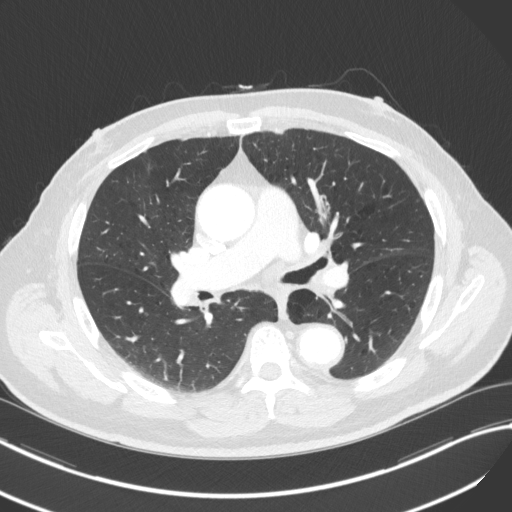
[im 101/161  lung]
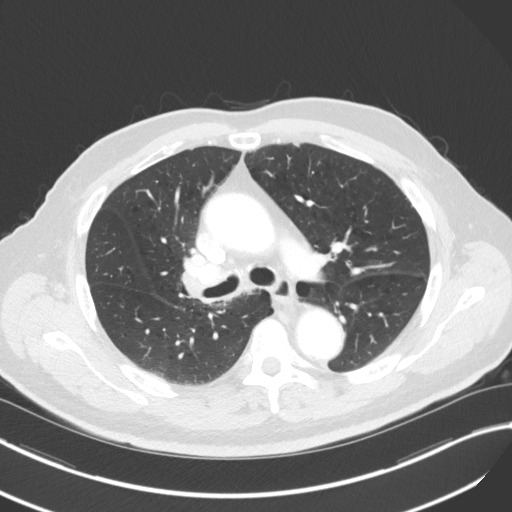
[im 113/161  mediastinal]
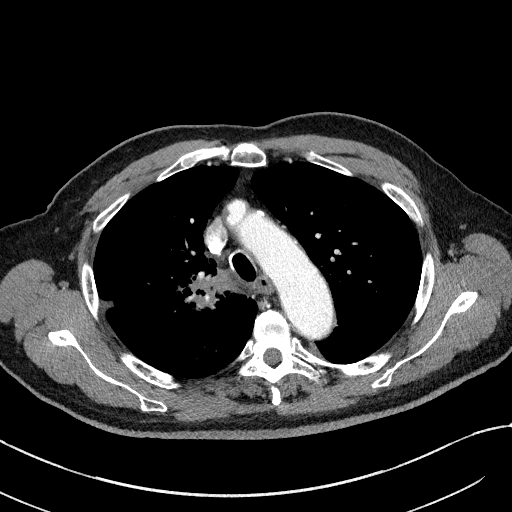
[im 113/161  lung]
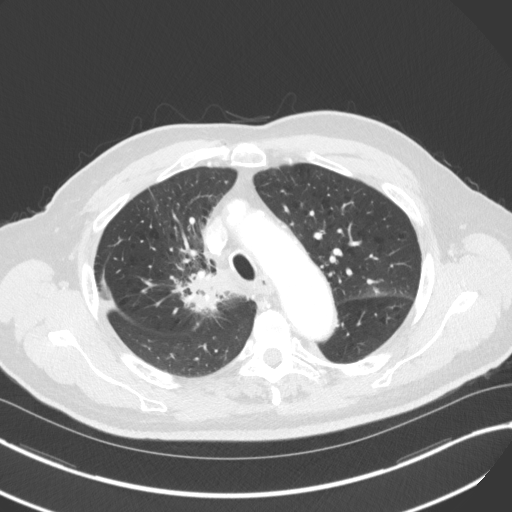
[im 125/161  lung]
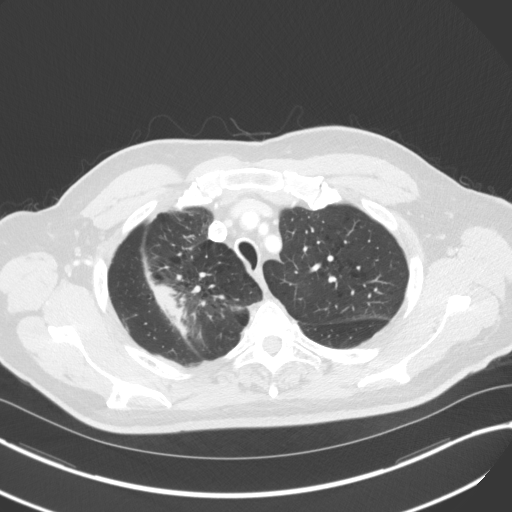
[im 137/161  lung]
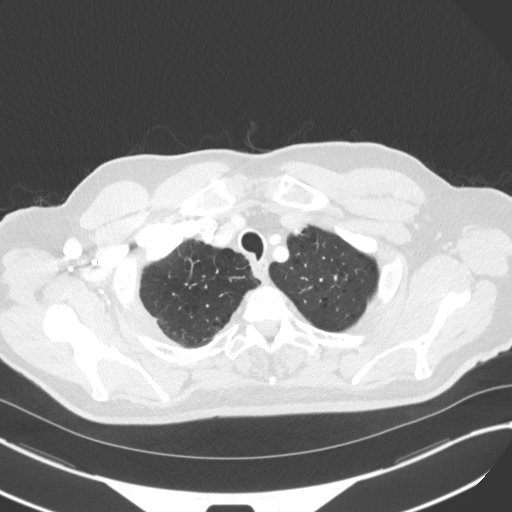
[im 149/161  lung]
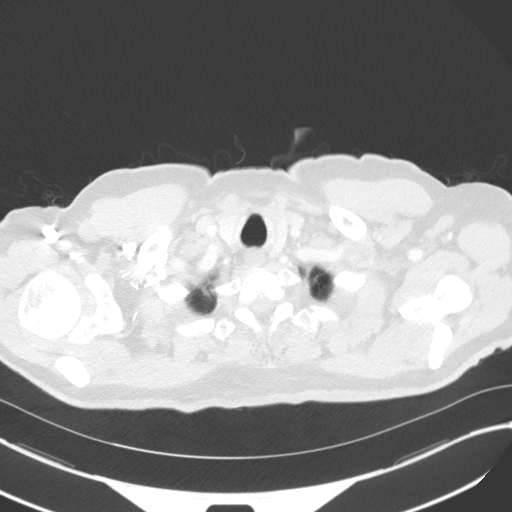

[Series 5: coronal · coronal · 0.65mm/px · 3 of 156 slices shown]
[im 32/156  lung]
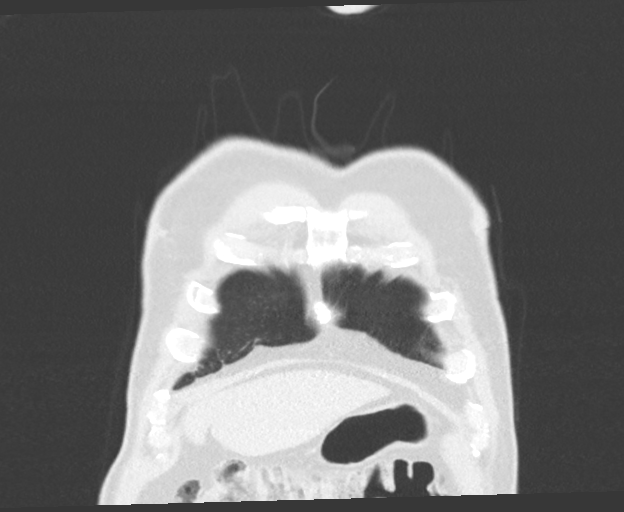
[im 63/156  lung]
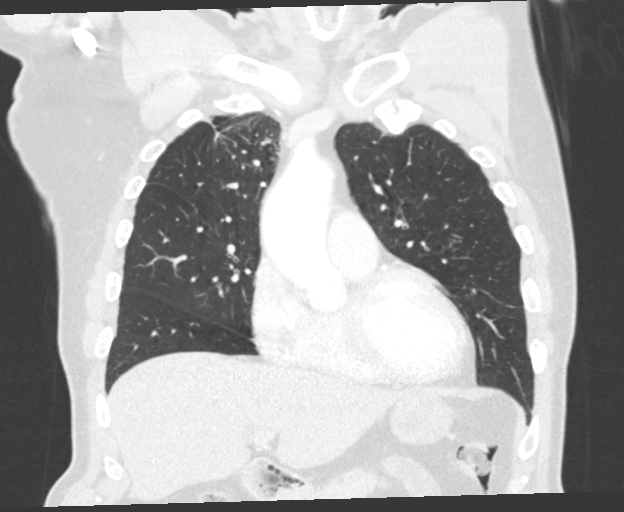
[im 94/156  lung]
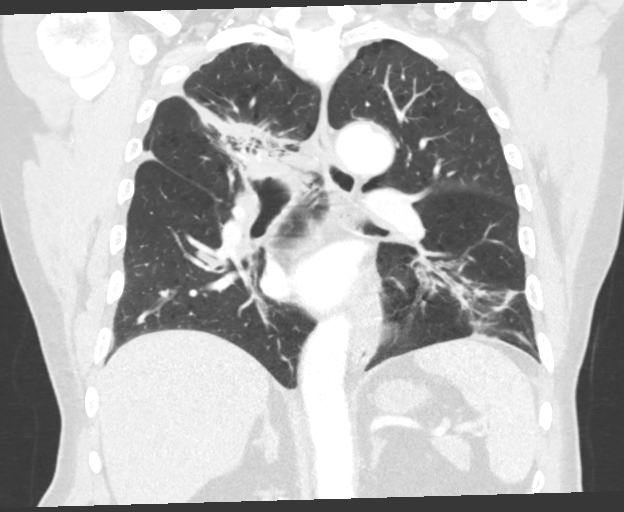

[15 of 36 positions shown; findings below may reference images not displayed]

FINDINGS: Cardiovascular: Coronary artery calcification and aortic
atherosclerotic calcification.

Mediastinum/Nodes: RIGHT hilar lymph node measures 15 mm compared to
10 mm. LEFT suprahilar fullness which extends to the RIGHT lower
paratracheal mediastinum measures 21 mm increased from 14 mm.

Increased RIGHT perihilar soft tissue thickening which extends the
mediastinum is seen on coronal image 91/5.

Lungs/Pleura: Increase in thickness of band of consolidation in the
LEFT upper lobe at treatment site. A central solid portion measures
19 x 17 mm (image 41/3 compared to 12 mm x 9 mm.

Upper Abdomen: Limited view of the liver, kidneys, pancreas are
unremarkable. Normal adrenal glands. Atrophic RIGHT kidney

Musculoskeletal: No chest wall abnormality. No acute or significant
osseous findings.
IMPRESSION: 1. Increased band like nodular thickening in the RIGHT upper lobe at
radiation treatment site is favored benign post radiation change.
2. Increased soft tissue in the RIGHT hilum and RIGHT paratracheal
mediastinum. Recommend close attention on follow-up with
differential including inflammatory response following radiation
therapy versus central disease progression (image 90/5).

## 2020-07-30 MED ORDER — IOHEXOL 300 MG/ML  SOLN
75.0000 mL | Freq: Once | INTRAMUSCULAR | Status: AC | PRN
  Administered 2020-07-30: 75 mL via INTRAVENOUS

## 2020-08-01 ENCOUNTER — Other Ambulatory Visit: Payer: Self-pay

## 2020-08-01 ENCOUNTER — Ambulatory Visit
Admission: RE | Admit: 2020-08-01 | Discharge: 2020-08-01 | Disposition: A | Discharge status: Home / Self Care | Payer: Medicare Other | Source: Ambulatory Visit | Attending: Radiation Oncology | Admitting: Radiation Oncology

## 2020-08-01 ENCOUNTER — Encounter: Payer: Self-pay | Admitting: Radiation Oncology

## 2020-08-01 VITALS — BP 163/86 | HR 76 | Temp 97.0°F | Wt 183.0 lb

## 2020-08-01 DIAGNOSIS — C3490 Malignant neoplasm of unspecified part of unspecified bronchus or lung: Secondary | ICD-10-CM

## 2020-08-01 DIAGNOSIS — F17210 Nicotine dependence, cigarettes, uncomplicated: Secondary | ICD-10-CM | POA: Insufficient documentation

## 2020-08-01 DIAGNOSIS — Z923 Personal history of irradiation: Secondary | ICD-10-CM | POA: Insufficient documentation

## 2020-08-01 DIAGNOSIS — C3432 Malignant neoplasm of lower lobe, left bronchus or lung: Secondary | ICD-10-CM | POA: Insufficient documentation

## 2020-08-01 DIAGNOSIS — C7801 Secondary malignant neoplasm of right lung: Secondary | ICD-10-CM | POA: Diagnosis not present

## 2020-08-01 DIAGNOSIS — Z08 Encounter for follow-up examination after completed treatment for malignant neoplasm: Secondary | ICD-10-CM | POA: Diagnosis not present

## 2020-08-01 NOTE — Progress Notes (Signed)
Radiation Oncology Follow up Note  Name: Philip Wood   Date:   08/01/2020 MRN:  [id number] DOB: 1945/11/28    Patient is a 75 year old male now out 6 months having completed SBRT to his right upper lobe for stage I non-small cell lung cancer  REFERRING PROVIDER: Venita Lick, NP  HPI: Patient is a 75 year old male now out 6 months having completed SBRT to his right upper lobe for stage I non-small cell lung cancer.  He is seen today in routine follow-up is doing well specifically in his cough hemoptysis chest tightness or fatigue.  He had a recent CT scan.  Showing increased bandlike thickening in the right upper lobe consistent with radiation changes.  Does have some increased soft tissue density in the right right hilum and right paratracheal mediastinal lymph nodes which we will follow.  Patient does have stage IIIa chronic renal disease and diabetes mellitus.  Also has history of chronic systolic heart failure.  COMPLICATIONS OF TREATMENT: none  FOLLOW UP COMPLIANCE: keeps appointments   PHYSICAL EXAM:  BP (!) 163/86   Pulse 76   Temp (!) 97 F (36.1 C) (Tympanic)   Wt 183 lb (83 kg)   BMI 27.02 kg/m  Well-developed well-nourished patient in NAD. HEENT reveals PERLA, EOMI, discs not visualized.  Oral cavity is clear. No oral mucosal lesions are identified. Neck is clear without evidence of cervical or supraclavicular adenopathy. Lungs are clear to A&P. Cardiac examination is essentially unremarkable with regular rate and rhythm without murmur rub or thrill. Abdomen is benign with no organomegaly or masses noted. Motor sensory and DTR levels are equal and symmetric in the upper and lower extremities. Cranial nerves II through XII are grossly intact. Proprioception is intact. No peripheral adenopathy or edema is identified. No motor or sensory levels are noted. Crude visual fields are within normal range.  RADIOLOGY RESULTS: CT scan reviewed compatible with above-stated  findings  PLAN: Present time I reviewed his CT scans he has stable findings.  We will continue to monitor his adenopathy in the mediastinum with a 33-month follow-up CT scan.  I will seen him after that for follow-up.  Patient is to call with any concerns.  I would like to take this opportunity to thank you for allowing me to participate in the care of your patient.[name], MD

## 2020-08-20 ENCOUNTER — Other Ambulatory Visit: Payer: Self-pay | Admitting: Nurse Practitioner

## 2020-08-29 ENCOUNTER — Ambulatory Visit: Payer: Medicare Other

## 2020-08-31 ENCOUNTER — Ambulatory Visit (INDEPENDENT_AMBULATORY_CARE_PROVIDER_SITE_OTHER): Payer: Medicare Other

## 2020-08-31 VITALS — Ht 69.0 in | Wt 180.0 lb

## 2020-08-31 DIAGNOSIS — Z0000 Encounter for general adult medical examination without abnormal findings: Secondary | ICD-10-CM

## 2020-08-31 NOTE — Patient Instructions (Signed)
[name] , Thank you for taking time to come for your Medicare Wellness Visit. I appreciate your ongoing commitment to your health goals. Please review the following plan we discussed and let me know if I can assist you in the future.   Screening recommendations/referrals: Colonoscopy: not required Recommended yearly ophthalmology/optometry visit for glaucoma screening and checkup Recommended yearly dental visit for hygiene and checkup  Vaccinations: Influenza vaccine: completed 01/17/2020, due 11/19/2020 Pneumococcal vaccine: completed 08/12/2013 Tdap vaccine: completed 08/04/2012, due 08/05/2022 Shingles vaccine: discussed   Covid-19:  08/10/2020, 02/15/2020, 07/16/2019, 06/18/2019  Advanced directives: Advance directive discussed with you today.   Conditions/risks identified: smoking  Next appointment: Follow up in one year for your annual wellness visit.   Preventive Care 75 Years and Older, Male Preventive care refers to lifestyle choices and visits with your health care provider that can promote health and wellness. What does preventive care include?  A yearly physical exam. This is also called an annual well check.  Dental exams once or twice a year.  Routine eye exams. Ask your health care provider how often you should have your eyes checked.  Personal lifestyle choices, including:  Daily care of your teeth and gums.  Regular physical activity.  Eating a healthy diet.  Avoiding tobacco and drug use.  Limiting alcohol use.  Practicing safe sex.  Taking low doses of aspirin every day.  Taking vitamin and mineral supplements as recommended by your health care provider. What happens during an annual well check? The services and screenings done by your health care provider during your annual well check will depend on your age, overall health, lifestyle risk factors, and family history of disease. Counseling  Your health care provider may ask you questions about  your:  Alcohol use.  Tobacco use.  Drug use.  Emotional well-being.  Home and relationship well-being.  Sexual activity.  Eating habits.  History of falls.  Memory and ability to understand (cognition).  Work and work Statistician. Screening  You may have the following tests or measurements:  Height, weight, and BMI.  Blood pressure.  Lipid and cholesterol levels. These may be checked every 5 years, or more frequently if you are over 14 years old.  Skin check.  Lung cancer screening. You may have this screening every year starting at age 11 if you have a 30-pack-year history of smoking and currently smoke or have quit within the past 15 years.  Fecal occult blood test (FOBT) of the stool. You may have this test every year starting at age 72.  Flexible sigmoidoscopy or colonoscopy. You may have a sigmoidoscopy every 5 years or a colonoscopy every 10 years starting at age 28.  Prostate cancer screening. Recommendations will vary depending on your family history and other risks.  Hepatitis C blood test.  Hepatitis B blood test.  Sexually transmitted disease (STD) testing.  Diabetes screening. This is done by checking your blood sugar (glucose) after you have not eaten for a while (fasting). You may have this done every 1-3 years.  Abdominal aortic aneurysm (AAA) screening. You may need this if you are a current or former smoker.  Osteoporosis. You may be screened starting at age 50 if you are at high risk. Talk with your health care provider about your test results, treatment options, and if necessary, the need for more tests. Vaccines  Your health care provider may recommend certain vaccines, such as:  Influenza vaccine. This is recommended every year.  Tetanus, diphtheria, and acellular pertussis (  Tdap, Td) vaccine. You may need a Td booster every 10 years.  Zoster vaccine. You may need this after age 52.  Pneumococcal 13-valent conjugate (PCV13) vaccine.  One dose is recommended after age 23.  Pneumococcal polysaccharide (PPSV23) vaccine. One dose is recommended after age 18. Talk to your health care provider about which screenings and vaccines you need and how often you need them. This information is not intended to replace advice given to you by your health care provider. Make sure you discuss any questions you have with your health care provider. Document Released: 05/04/2015 Document Revised: 12/26/2015 Document Reviewed: 02/06/2015 Elsevier Interactive Patient Education  2017 [place] Prevention in the Home Falls can cause injuries. They can happen to people of all ages. There are many things you can do to make your home safe and to help prevent falls. What can I do on the outside of my home?  Regularly fix the edges of walkways and driveways and fix any cracks.  Remove anything that might make you trip as you walk through a door, such as a raised step or threshold.  Trim any bushes or trees on the path to your home.  Use bright outdoor lighting.  Clear any walking paths of anything that might make someone trip, such as rocks or tools.  Regularly check to see if handrails are loose or broken. Make sure that both sides of any steps have handrails.  Any raised decks and porches should have guardrails on the edges.  Have any leaves, snow, or ice cleared regularly.  Use sand or salt on walking paths during winter.  Clean up any spills in your garage right away. This includes oil or grease spills. What can I do in the bathroom?  Use night lights.  Install grab bars by the toilet and in the tub and shower. Do not use towel bars as grab bars.  Use non-skid mats or decals in the tub or shower.  If you need to sit down in the shower, use a plastic, non-slip stool.  Keep the floor dry. Clean up any water that spills on the floor as soon as it happens.  Remove soap buildup in the tub or shower regularly.  Attach bath  mats securely with double-sided non-slip rug tape.  Do not have throw rugs and other things on the floor that can make you trip. What can I do in the bedroom?  Use night lights.  Make sure that you have a light by your bed that is easy to reach.  Do not use any sheets or blankets that are too big for your bed. They should not hang down onto the floor.  Have a firm chair that has side arms. You can use this for support while you get dressed.  Do not have throw rugs and other things on the floor that can make you trip. What can I do in the kitchen?  Clean up any spills right away.  Avoid walking on wet floors.  Keep items that you use a lot in easy-to-reach places.  If you need to reach something above you, use a strong step stool that has a grab bar.  Keep electrical cords out of the way.  Do not use floor polish or wax that makes floors slippery. If you must use wax, use non-skid floor wax.  Do not have throw rugs and other things on the floor that can make you trip. What can I do with my stairs?  Do  not leave any items on the stairs.  Make sure that there are handrails on both sides of the stairs and use them. Fix handrails that are broken or loose. Make sure that handrails are as long as the stairways.  Check any carpeting to make sure that it is firmly attached to the stairs. Fix any carpet that is loose or worn.  Avoid having throw rugs at the top or bottom of the stairs. If you do have throw rugs, attach them to the floor with carpet tape.  Make sure that you have a light switch at the top of the stairs and the bottom of the stairs. If you do not have them, ask someone to add them for you. What else can I do to help prevent falls?  Wear shoes that:  Do not have high heels.  Have rubber bottoms.  Are comfortable and fit you well.  Are closed at the toe. Do not wear sandals.  If you use a stepladder:  Make sure that it is fully opened. Do not climb a closed  stepladder.  Make sure that both sides of the stepladder are locked into place.  Ask someone to hold it for you, if possible.  Clearly mark and make sure that you can see:  Any grab bars or handrails.  First and last steps.  Where the edge of each step is.  Use tools that help you move around (mobility aids) if they are needed. These include:  Canes.  Walkers.  Scooters.  Crutches.  Turn on the lights when you go into a dark area. Replace any light bulbs as soon as they burn out.  Set up your furniture so you have a clear path. Avoid moving your furniture around.  If any of your floors are uneven, fix them.  If there are any pets around you, be aware of where they are.  Review your medicines with your doctor. Some medicines can make you feel dizzy. This can increase your chance of falling. Ask your doctor what other things that you can do to help prevent falls. This information is not intended to replace advice given to you by your health care provider. Make sure you discuss any questions you have with your health care provider. Document Released: 02/01/2009 Document Revised: 09/13/2015 Document Reviewed: 05/12/2014 Elsevier Interactive Patient Education  2017 [employer].

## 2020-08-31 NOTE — Progress Notes (Signed)
I connected with Philip Wood today by telephone and verified that I am speaking with the correct person using two identifiers. Location patient: home Location provider: work Persons participating in the virtual visit: Pedram, Goodchild LPN.   I discussed the limitations, risks, security and privacy concerns of performing an evaluation and management service by telephone and the availability of in person appointments. I also discussed with the patient that there may be a patient responsible charge related to this service. The patient expressed understanding and verbally consented to this telephonic visit.    Interactive audio and video telecommunications were attempted between this provider and patient, however failed, due to patient having technical difficulties OR patient did not have access to video capability.  We continued and completed visit with audio only.     Vital signs may be patient reported or missing.  Subjective:   Philip Wood is a 75 y.o. male who presents for Medicare Annual/Subsequent preventive examination.  Review of Systems     Cardiac Risk Factors include: advanced age (>23men, >18 women);diabetes mellitus;dyslipidemia;hypertension;male gender;sedentary lifestyle;smoking/ tobacco exposure     Objective:    Today's Vitals   08/31/20 1426  Weight: 180 lb (81.6 kg)  Height: 5\' 9"  (1.753 m)   Body mass index is 26.58 kg/m.  Advanced Directives 08/31/2020 01/26/2020 08/25/2019 08/02/2019 07/12/2019 05/17/2019 03/15/2019  Does Patient Have a Medical Advance Directive? No No No No No No No  Would patient like information on creating a medical advance directive? - No - Patient declined - No - Patient declined - No - Patient declined No - Patient declined    Current Medications (verified) Outpatient Encounter Medications as of 08/31/2020  Medication Sig  . allopurinol (ZYLOPRIM) 100 MG tablet Take 1 tablet (100 mg total) by mouth daily.  Marland Kitchen aspirin  81 MG tablet Take 81 mg by mouth daily.  Marland Kitchen atorvastatin (LIPITOR) 80 MG tablet Take 1 tablet (80 mg total) by mouth daily at 6 PM.  . metFORMIN (GLUCOPHAGE) 500 MG tablet TAKE 1 TABLET BY MOUTH TWICE DAILY WITH A MEAL  . metoprolol succinate (TOPROL XL) 25 MG 24 hr tablet Take 1 tablet (25 mg total) by mouth daily.  . ramipril (ALTACE) 10 MG capsule Take 1 capsule (10 mg total) by mouth daily.  Marland Kitchen tiotropium (SPIRIVA HANDIHALER) 18 MCG inhalation capsule PLACE 1 CAPSULE INTO INHALER AND INHALE ITS CONTENTS ONCE DAILY.  . vitamin B-12 (CYANOCOBALAMIN) 1000 MCG tablet Take 1,000 mcg by mouth daily.   No facility-administered encounter medications on file as of 08/31/2020.    Allergies (verified) Patient has no known allergies.   History: Past Medical History:  Diagnosis Date  . 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 COPD (chronic obstructive pulmonary disease) ([place])   . Diabetes mellitus without complication ([place])   . GERD (gastroesophageal reflux disease)   . Hyperlipidemia   . Hypertension   . Lumbago   . Neuropathy   . Tobacco abuse disorder    Past Surgical History:  Procedure Laterality Date  . CHOLECYSTECTOMY    . ELECTROMAGNETIC NAVIGATION BROCHOSCOPY N/A 08/19/2017   Procedure: ELECTROMAGNETIC NAVIGATION BRONCHOSCOPY;  Surgeon: Flora Lipps, MD;  Location: [facility] ORS;  Service: Cardiopulmonary;  Laterality: N/A;  . ELECTROMAGNETIC NAVIGATION BROCHOSCOPY Left 09/02/2017   Procedure: ELECTROMAGNETIC NAVIGATION BRONCHOSCOPY;  Surgeon: Flora Lipps, MD;  Location: [facility] ORS;  Service: Cardiopulmonary;  Laterality: Left;  . THROAT SURGERY     Family History  Problem Relation Age of Onset  . Heart  disease Father   . Heart attack Father   . Brain cancer Sister   . Cervical cancer Sister   . Rectal cancer Brother   . Lung cancer Brother   . Lung cancer Brother   . Lung cancer Brother   . Stomach cancer Sister   . Lung cancer Brother   . Stomach cancer Sister   . Diabetes Sister   .  Lupus Sister    Social History   Socioeconomic History  . Marital status: Married    Spouse name: Not on file  . Number of children: Not on file  . Years of education: Not on file  . Highest education level: 10th grade  Occupational History  . Not on file  Tobacco Use  . Smoking status: Light Tobacco Smoker    Packs/day: 0.60    Years: 53.00    Pack years: 31.80    Types: Cigarettes  . Smokeless tobacco: Never Used  . Tobacco comment: Aprrox 10 cigs/day  Vaping Use  . Vaping Use: Never used  Substance and Sexual Activity  . Alcohol use: No    Alcohol/week: 0.0 standard drinks  . Drug use: No  . Sexual activity: Not Currently  Other Topics Concern  . Not on file  Social History Narrative   Works part time at [employer]: [place]   . Difficulty of Paying Living Expenses: Not hard at all  Food Insecurity: No Food Insecurity  . Worried About Charity fundraiser in the Last Year: Never true  . Ran Out of Food in the Last Year: Never true  Transportation Needs: No Transportation Needs  . Lack of Transportation (Medical): No  . Lack of Transportation (Non-Medical): No  Physical Activity: Inactive  . Days of Exercise per Week: 0 days  . Minutes of Exercise per Session: 0 min  Stress: No Stress Concern Present  . Feeling of Stress : Not at all  Social Connections: Not on file    Tobacco Counseling Ready to quit: Not Answered Counseling given: Not Answered Comment: Aprrox 10 cigs/day   Clinical Intake:  Pre-visit preparation completed: Yes  Pain : No/denies pain     Nutritional Status: BMI 25 -29 Overweight Nutritional Risks: None Diabetes: Yes  How often do you need to have someone help you when you read instructions, pamphlets, or other written materials from your doctor or pharmacy?: 1 - Never What is the last grade level you completed in school?: 10th grade  Diabetic? Yes Nutrition Risk  Assessment:  Has the patient had any N/V/D within the last 2 months?  No  Does the patient have any non-healing wounds?  No  Has the patient had any unintentional weight loss or weight gain?  No   Diabetes:  Is the patient diabetic?  Yes  If diabetic, was a CBG obtained today?  No  Did the patient bring in their glucometer from home?  No  How often do you monitor your CBG's? Does not.   Financial Strains and Diabetes Management:  Are you having any financial strains with the device, your supplies or your medication? No .  Does the patient want to be seen by Chronic Care Management for management of their diabetes?  No  Would the patient like to be referred to a Nutritionist or for Diabetic Management?  No   Diabetic Exams:  Diabetic Eye Exam: Overdue for diabetic eye exam. Pt has been advised about the  importance in completing this exam. Patient advised to call and schedule an eye exam. Diabetic Foot Exam: Completed 05/31/2020   Interpreter Needed?: No  Information entered by :: NAllen LPN   Activities of Daily Living In your present state of health, do you have any difficulty performing the following activities: 08/31/2020  Hearing? N  Vision? N  Difficulty concentrating or making decisions? N  Walking or climbing stairs? N  Dressing or bathing? N  Doing errands, shopping? N  Preparing Food and eating ? N  Using the Toilet? N  In the past six months, have you accidently leaked urine? N  Do you have problems with loss of bowel control? N  Managing your Medications? N  Managing your Finances? N  Housekeeping or managing your Housekeeping? N  Some recent data might be hidden    Patient Care Team: Venita Lick, NP as PCP - General (Nurse Practitioner) Wellington Hampshire, MD as PCP - Cardiology (Cardiology) Beverly Gust, MD (Otolaryngology) Telford Nab, RN as Registered Nurse [name], MD as Radiation Oncologist (Radiation Oncology)  Indicate any  recent Medical Services you may have received from other than [facility] providers in the past year (date may be approximate).     Assessment:   This is a routine wellness examination for Philip Wood.  Hearing/Vision screen No exam data present  Dietary issues and exercise activities discussed: Current Exercise Habits: The patient does not participate in regular exercise at present  Goals Addressed            This Visit's Progress   . Patient Stated       08/31/2020, no goals      Depression Screen PHQ 2/9 Scores 08/31/2020 05/31/2020 11/29/2019 09/20/2018 07/13/2017 07/11/2016 03/28/2015  PHQ - 2 Score 0 0 0 0 0 0 0  PHQ- 9 Score - 0 - - - 0 -    Fall Risk Fall Risk  08/31/2020 05/31/2020 08/25/2019 09/20/2018 07/13/2017  Falls in the past year? 0 0 0 0 No  Number falls in past yr: - - 0 - -  Injury with Fall? - 0 0 - -  Risk for fall due to : Medication side effect - - - -  Follow up Falls evaluation completed;Education provided;Falls prevention discussed - - - -    FALL RISK PREVENTION PERTAINING TO THE HOME:  Any stairs in or around the home? Yes  If so, are there any without handrails? No  Home free of loose throw rugs in walkways, pet beds, electrical cords, etc? Yes  Adequate lighting in your home to reduce risk of falls? Yes   ASSISTIVE DEVICES UTILIZED TO PREVENT FALLS:  Life alert? No  Use of a cane, walker or w/c? No  Grab bars in the bathroom? Yes  Shower chair or bench in shower? No  Elevated toilet seat or a handicapped toilet? No   TIMED UP AND GO:  Was the test performed? No .  Cognitive Function:     6CIT Screen 08/31/2020 09/20/2018 07/13/2017  What Year? 0 points 0 points 0 points  What month? 0 points 0 points 0 points  What time? 0 points 0 points 0 points  Count back from 20 0 points 0 points 0 points  Months in reverse 0 points 0 points 0 points  Repeat phrase 6 points 2 points 0 points  Total Score 6 2 0    Immunizations Immunization History   Administered Date(s) Administered  . Influenza, High Dose Seasonal PF 01/11/2016, 01/12/2017,  02/12/2018, 01/25/2019  . Influenza,inj,Quad PF,6+ Mos 03/28/2015  . Influenza-Unspecified 01/17/2020  . Moderna Sars-Covid-2 Vaccination 06/18/2019, 07/16/2019, 02/15/2020  . Pneumococcal Conjugate-13 08/12/2013  . Pneumococcal Polysaccharide-23 02/06/2012  . Td 11/24/2005  . Tdap 08/04/2012    TDAP status: Up to date  Flu Vaccine status: Up to date  Pneumococcal vaccine status: Up to date  Covid-19 vaccine status: Completed vaccines  Qualifies for Shingles Vaccine? Yes   Zostavax completed No   Shingrix Completed?: No.    Education has been provided regarding the importance of this vaccine. Patient has been advised to call insurance company to determine out of pocket expense if they have not yet received this vaccine. Advised may also receive vaccine at local pharmacy or Health Dept. Verbalized acceptance and understanding.  Screening Tests Health Maintenance  Topic Date Due  . OPHTHALMOLOGY EXAM  08/05/2018  . COVID-19 Vaccine (4 - Booster for Moderna series) 05/17/2020  . INFLUENZA VACCINE  11/19/2020  . HEMOGLOBIN A1C  11/28/2020  . FOOT EXAM  05/31/2021  . COLON CANCER SCREENING ANNUAL FOBT  07/27/2021  . TETANUS/TDAP  08/05/2022  . Hepatitis C Screening  Completed  . PNA vac Low Risk Adult  Completed  . HPV VACCINES  Aged Out  . COLONOSCOPY (Pts 45-45yrs Insurance coverage will need to be confirmed)  Discontinued    Health Maintenance  Health Maintenance Due  Topic Date Due  . OPHTHALMOLOGY EXAM  08/05/2018  . COVID-19 Vaccine (4 - Booster for Moderna series) 05/17/2020    Colorectal cancer screening: No longer required.   Lung Cancer Screening: (Low Dose CT Chest recommended if Age 76-80 years, 30 pack-year currently smoking OR have quit w/in 15years.) does qualify.   Lung Cancer Screening Referral: CT 07/30/2020  Additional Screening:  Hepatitis C Screening:  does qualify; Completed 03/28/2015  Vision Screening: Recommended annual ophthalmology exams for early detection of glaucoma and other disorders of the eye. Is the patient up to date with their annual eye exam?  No  Who is the provider or what is the name of the office in which the patient attends annual eye exams? [facility] If pt is not established with a provider, would they like to be referred to a provider to establish care? No .   Dental Screening: Recommended annual dental exams for proper oral hygiene  Community Resource Referral / Chronic Care Management: CRR required this visit?  No   CCM required this visit?  No      Plan:     I have personally reviewed and noted the following in the patient's chart:   . Medical and social history . Use of alcohol, tobacco or illicit drugs  . Current medications and supplements including opioid prescriptions. Patient is not currently taking opioid prescriptions. . Functional ability and status . Nutritional status . Physical activity . Advanced directives . List of other physicians . Hospitalizations, surgeries, and ER visits in previous 12 months . Vitals . Screenings to include cognitive, depression, and falls . Referrals and appointments  In addition, I have reviewed and discussed with patient certain preventive protocols, quality metrics, and best practice recommendations. A written personalized care plan for preventive services as well as general preventive health recommendations were provided to patient.     Kellie Simmering, LPN   [id number]   Nurse Notes:

## 2020-09-20 ENCOUNTER — Other Ambulatory Visit
Admission: RE | Admit: 2020-09-20 | Discharge: 2020-09-20 | Disposition: A | Discharge status: Home / Self Care | Payer: Medicare Other | Attending: Ophthalmology | Admitting: Ophthalmology

## 2020-09-20 DIAGNOSIS — H5711 Ocular pain, right eye: Secondary | ICD-10-CM | POA: Diagnosis not present

## 2020-09-20 DIAGNOSIS — R519 Headache, unspecified: Secondary | ICD-10-CM | POA: Diagnosis not present

## 2020-09-20 LAB — C-REACTIVE PROTEIN: CRP: 1.3 mg/dL — ABNORMAL HIGH (ref <1.0)

## 2020-09-20 LAB — CBC WITH DIFFERENTIAL/PLATELET
Abs Immature Granulocytes: 0.02 10*3/uL (ref 0.00–0.07)
Basophils Absolute: 0 10*3/uL (ref 0.0–0.1)
Basophils Relative: 0 %
Eosinophils Absolute: 1 10*3/uL — ABNORMAL HIGH (ref 0.0–0.5)
Eosinophils Relative: 15 %
HCT: 42.7 % (ref 39.0–52.0)
Hemoglobin: 14.8 g/dL (ref 13.0–17.0)
Immature Granulocytes: 0 %
Lymphocytes Relative: 22 %
Lymphs Abs: 1.5 10*3/uL (ref 0.7–4.0)
MCH: 32.5 pg (ref 26.0–34.0)
MCHC: 34.7 g/dL (ref 30.0–36.0)
MCV: 93.8 fL (ref 80.0–100.0)
Monocytes Absolute: 0.7 10*3/uL (ref 0.1–1.0)
Monocytes Relative: 10 %
Neutro Abs: 3.7 10*3/uL (ref 1.7–7.7)
Neutrophils Relative %: 53 %
Platelets: 157 10*3/uL (ref 150–400)
RBC: 4.55 MIL/uL (ref 4.22–5.81)
RDW: 13.1 % (ref 11.5–15.5)
WBC: 6.9 10*3/uL (ref 4.0–10.5)
nRBC: 0 % (ref 0.0–0.2)

## 2020-09-20 LAB — SEDIMENTATION RATE: Sed Rate: 7 mm/hr (ref 0–20)

## 2020-10-08 ENCOUNTER — Encounter: Payer: Self-pay | Admitting: Nurse Practitioner

## 2020-10-08 ENCOUNTER — Other Ambulatory Visit: Payer: Self-pay

## 2020-10-08 ENCOUNTER — Ambulatory Visit (INDEPENDENT_AMBULATORY_CARE_PROVIDER_SITE_OTHER): Payer: Medicare Other | Admitting: Nurse Practitioner

## 2020-10-08 VITALS — BP 142/80 | HR 75 | Temp 98.5°F | Wt 178.1 lb

## 2020-10-08 DIAGNOSIS — G43801 Other migraine, not intractable, with status migrainosus: Secondary | ICD-10-CM

## 2020-10-08 MED ORDER — KETOROLAC TROMETHAMINE 60 MG/2ML IM SOLN
60.0000 mg | Freq: Once | INTRAMUSCULAR | Status: AC
Start: 2020-10-08 — End: 2020-10-08
  Administered 2020-10-08: 60 mg via INTRAMUSCULAR

## 2020-10-08 NOTE — Progress Notes (Signed)
BP (!) 142/80   Pulse 75   Temp 98.5 F (36.9 C)   Wt 178 lb 2 oz (80.8 kg)   SpO2 97%   BMI 26.30 kg/m    Subjective:    Patient ID: Philip Wood, male    DOB: [birth year], 75 y.o.   MRN: [id number]  HPI: Philip Wood is a 75 y.o. male  Chief Complaint  Patient presents with   Headache    X 3 weeks, has been taking tylenol. Started with his right eye, moved up his head. Was seen by the eye doctor and told everything was fine, just given drops for dry eyes   MIGRAINES Duration: weeks Onset: sudden Severity: 5/10 Quality: throbbing Frequency: constant Location: starts above his right eye and moves to the back of his head Headache duration: 3 weeks Radiation: yes Time of day headache occurs:  Alleviating factors:  Aggravating factors:  Headache status at time of visit: current headache Treatments attempted: Treatments attempted:  tylenol helps but doesn't take it away.    Aura: no Nausea:  no Vomiting: no Photophobia:  no Phonophobia:  no Effect on social functioning:  no Numbers of missed days of school/work each month:  Confusion:  no Gait disturbance/ataxia:  no Behavioral changes:  no Fevers:  no  Relevant past medical, surgical, family and social history reviewed and updated as indicated. Interim medical history since our last visit reviewed. Allergies and medications reviewed and updated.  Review of Systems  Constitutional:  Negative for fever.  Eyes:  Negative for photophobia and visual disturbance.  Gastrointestinal:  Negative for nausea and vomiting.  Musculoskeletal:  Negative for gait problem.  Neurological:  Positive for headaches.  Psychiatric/Behavioral:  Negative for behavioral problems and confusion.    Per HPI unless specifically indicated above     Objective:    BP (!) 142/80   Pulse 75   Temp 98.5 F (36.9 C)   Wt 178 lb 2 oz (80.8 kg)   SpO2 97%   BMI 26.30 kg/m   Wt Readings from Last 3 Encounters:  10/08/20 178 lb 2  oz (80.8 kg)  08/31/20 180 lb (81.6 kg)  08/01/20 183 lb (83 kg)    Physical Exam Vitals and nursing note reviewed.  Constitutional:      General: He is not in acute distress.    Appearance: Normal appearance. He is not ill-appearing, toxic-appearing or diaphoretic.  HENT:     Head: Normocephalic.     Right Ear: External ear normal.     Left Ear: External ear normal.     Nose: Nose normal. No congestion or rhinorrhea.     Mouth/Throat:     Mouth: Mucous membranes are moist.  Eyes:     General: No visual field deficit.       Right eye: No discharge.        Left eye: No discharge.     Extraocular Movements: Extraocular movements intact.     Conjunctiva/sclera: Conjunctivae normal.     Pupils: Pupils are equal, round, and reactive to light.  Cardiovascular:     Rate and Rhythm: Normal rate and regular rhythm.     Heart sounds: No murmur heard. Pulmonary:     Effort: Pulmonary effort is normal. No respiratory distress.     Breath sounds: Normal breath sounds. No wheezing, rhonchi or rales.  Abdominal:     General: Abdomen is flat. Bowel sounds are normal.  Musculoskeletal:     Cervical back: Normal  range of motion and neck supple.  Skin:    General: Skin is warm and dry.     Capillary Refill: Capillary refill takes less than 2 seconds.  Neurological:     General: No focal deficit present.     Mental Status: He is alert and oriented to person, place, and time.     Cranial Nerves: No cranial nerve deficit or facial asymmetry.     Motor: Motor function is intact.     Gait: Gait is intact.  Psychiatric:        Mood and Affect: Mood normal.        Behavior: Behavior normal.        Thought Content: Thought content normal.        Judgment: Judgment normal.    Results for orders placed or performed during the hospital encounter of 09/20/20  CBC with Differential/Platelet  Result Value Ref Range   WBC 6.9 4.0 - 10.5 K/uL   RBC 4.55 4.22 - 5.81 MIL/uL   Hemoglobin 14.8 13.0 -  17.0 g/dL   HCT 42.7 39.0 - 52.0 %   MCV 93.8 80.0 - 100.0 fL   MCH 32.5 26.0 - 34.0 pg   MCHC 34.7 30.0 - 36.0 g/dL   RDW 13.1 11.5 - 15.5 %   Platelets 157 150 - 400 K/uL   nRBC 0.0 0.0 - 0.2 %   Neutrophils Relative % 53 %   Neutro Abs 3.7 1.7 - 7.7 K/uL   Lymphocytes Relative 22 %   Lymphs Abs 1.5 0.7 - 4.0 K/uL   Monocytes Relative 10 %   Monocytes Absolute 0.7 0.1 - 1.0 K/uL   Eosinophils Relative 15 %   Eosinophils Absolute 1.0 (H) 0.0 - 0.5 K/uL   Basophils Relative 0 %   Basophils Absolute 0.0 0.0 - 0.1 K/uL   Immature Granulocytes 0 %   Abs Immature Granulocytes 0.02 0.00 - 0.07 K/uL  C-reactive protein  Result Value Ref Range   CRP 1.3 (H) <1.0 mg/dL  Sedimentation rate  Result Value Ref Range   Sed Rate 7 0 - 20 mm/hr      Assessment & Plan:   Problem List Items Addressed This Visit   None Visit Diagnoses     Other migraine with status migrainosus, not intractable    -  Primary   Toradol given in office today. Neuro exam was normal. If symptoms do not improve with injection with order MRI of the brain to evaluate symptoms further.    Relevant Medications   ketorolac (TORADOL) injection 60 mg (Completed) (Start on 10/08/2020  1:45 PM)        Follow up plan: Return if symptoms worsen or fail to improve.   A total of 20 minutes were spent on this encounter today.  When total time is documented, this includes both the face-to-face and non-face-to-face time personally spent before, during and after the visit on the date of the encounter.

## 2020-10-15 ENCOUNTER — Telehealth: Payer: Self-pay

## 2020-10-15 ENCOUNTER — Ambulatory Visit (INDEPENDENT_AMBULATORY_CARE_PROVIDER_SITE_OTHER): Payer: Medicare Other | Admitting: Nurse Practitioner

## 2020-10-15 ENCOUNTER — Other Ambulatory Visit: Payer: Self-pay

## 2020-10-15 ENCOUNTER — Encounter: Payer: Self-pay | Admitting: Nurse Practitioner

## 2020-10-15 VITALS — BP 134/78 | HR 69 | Temp 97.3°F | Wt 181.2 lb

## 2020-10-15 DIAGNOSIS — E1122 Type 2 diabetes mellitus with diabetic chronic kidney disease: Secondary | ICD-10-CM | POA: Diagnosis not present

## 2020-10-15 DIAGNOSIS — J432 Centrilobular emphysema: Secondary | ICD-10-CM

## 2020-10-15 DIAGNOSIS — F17219 Nicotine dependence, cigarettes, with unspecified nicotine-induced disorders: Secondary | ICD-10-CM

## 2020-10-15 DIAGNOSIS — N1831 Chronic kidney disease, stage 3a: Secondary | ICD-10-CM

## 2020-10-15 DIAGNOSIS — R0989 Other specified symptoms and signs involving the circulatory and respiratory systems: Secondary | ICD-10-CM | POA: Diagnosis not present

## 2020-10-15 DIAGNOSIS — I5022 Chronic systolic (congestive) heart failure: Secondary | ICD-10-CM

## 2020-10-15 DIAGNOSIS — R7982 Elevated C-reactive protein (CRP): Secondary | ICD-10-CM | POA: Diagnosis not present

## 2020-10-15 DIAGNOSIS — I700 Atherosclerosis of aorta: Secondary | ICD-10-CM | POA: Diagnosis not present

## 2020-10-15 DIAGNOSIS — E1169 Type 2 diabetes mellitus with other specified complication: Secondary | ICD-10-CM

## 2020-10-15 DIAGNOSIS — C3490 Malignant neoplasm of unspecified part of unspecified bronchus or lung: Secondary | ICD-10-CM

## 2020-10-15 DIAGNOSIS — E785 Hyperlipidemia, unspecified: Secondary | ICD-10-CM

## 2020-10-15 DIAGNOSIS — E538 Deficiency of other specified B group vitamins: Secondary | ICD-10-CM | POA: Diagnosis not present

## 2020-10-15 DIAGNOSIS — R519 Headache, unspecified: Secondary | ICD-10-CM

## 2020-10-15 DIAGNOSIS — I130 Hypertensive heart and chronic kidney disease with heart failure and stage 1 through stage 4 chronic kidney disease, or unspecified chronic kidney disease: Secondary | ICD-10-CM

## 2020-10-15 DIAGNOSIS — N183 Chronic kidney disease, stage 3 unspecified: Secondary | ICD-10-CM

## 2020-10-15 LAB — BAYER DCA HB A1C WAIVED: HB A1C (BAYER DCA - WAIVED): 5.9 % (ref <7.0)

## 2020-10-15 MED ORDER — TIZANIDINE HCL 4 MG PO TABS: 4.0000 mg | ORAL_TABLET | Freq: Four times a day (QID) | ORAL | 0 refills | Status: DC | PRN

## 2020-10-15 MED ORDER — PREDNISONE 20 MG PO TABS: 40.0000 mg | ORAL_TABLET | Freq: Every day | ORAL | 0 refills | Status: AC

## 2020-10-15 NOTE — Assessment & Plan Note (Signed)
I have recommended complete cessation of tobacco use. I have discussed various options available for assistance with tobacco cessation including over the counter methods (Nicotine gum, patch and lozenges). We also discussed prescription options (Chantix, Nicotine Inhaler / Nasal Spray). The patient is not interested in pursuing any prescription tobacco cessation options at this time.  

## 2020-10-15 NOTE — Progress Notes (Signed)
BP 134/78 (BP Location: Left Arm)   Pulse 69   Temp (!) 97.3 F (36.3 C) (Oral)   Wt 181 lb 3.2 oz (82.2 kg)   SpO2 98%   BMI 26.76 kg/m    Subjective:    Patient ID: Philip Wood, male    DOB: Apr 29, 1945, 75 y.o.   MRN: [id number]  HPI: Philip Wood is a 75 y.o. male  Chief Complaint  Patient presents with   Headache    Patient states he was seen last Monday here in office and was given an injection and states it knocked out his headache for about 2 days. Patient states he had tried Tylenol to helps (6 a day).    DIABETES Continues on Metformin 500 MG BID.  Last A1C was 5.6% last check. Hypoglycemic episodes:no Polydipsia/polyuria: no Visual disturbance: no Chest pain: no Paresthesias: no Glucose Monitoring: no  Accucheck frequency: Not Checking  Fasting glucose:  Post prandial:  Evening:  Before meals: Taking Insulin?: no  Long acting insulin:  Short acting insulin: Blood Pressure Monitoring: a few times a week Retinal Examination: Not up to Date - going this Friday to [place] Exam: Up to Date Pneumovax: Up to Date Influenza: Up to Date Aspirin: yes   HYPERTENSION / HYPERLIPIDEMIA/HF Continues Ramipril, Metoprolol, and Atorvastatin + ASA.  Is followed by cardiology and last saw 12/29/19.  Last echo EF 45-50% in 2019.   States weight at home is staying around 180-182 lbs.  Satisfied with current treatment? yes Duration of hypertension: chronic BP monitoring frequency: a few times a week BP range: 120/70's average BP medication side effects: no Duration of hyperlipidemia: chronic Cholesterol medication side effects: no Cholesterol supplements: none Medication compliance: good compliance Aspirin: yes Recent stressors: no Recurrent headaches: no Visual changes: no Palpitations: no Dyspnea: baseline, no worse Chest pain: no Lower extremity edema: no Dizzy/lightheaded: no   LUNG CANCER (Right upper lobe nodule ): Followed by oncology  and received radiation treatment, no further treatments at this time.  Last saw Dr. Baruch Gouty 08/01/20. Smokes more than 1/2 PPD, he tried to quit but was unsuccessful.  Is not interested in quitting at this time.  Last followed by pulmonary in 2019, Dr. Mortimer Fries, for COPD.  Last CT scan 07/30/20 emphysema and aortic atherosclerosis. COPD status: stable Satisfied with current treatment?: yes Oxygen use: no Dyspnea frequency: occasional Cough frequency: occasional Rescue inhaler frequency: does not have Limitation of activity: no Productive cough: none Last Spirometry: 2019 Pneumovax: Up to Date Influenza: Up to Date  HEADACHES Has not had headache in 20 years since he stopped drinking.  Went to eye doctor about 3 weeks ago, thinking it was vision related and everything was normal.  Came into office on 20th, was given Toradol shot and this lasted only a couple days.  When he coughs it hurts the most to frontal aspect head and around to right side.    Wakes up takes 2 Tylenol and then will be good until 12 PM, then headache returns.  It is worse in the afternoon.  No recent URI and no history of allergies.  Does endorse it is worse headache of his life.  Denies slurred speech, weakness, vision changes, amaurosis fugax, jaw claudication, or facial droop.  Recent mild elevation CRP noted 09/20/20 = 1.3. Duration: days Onset: sudden Severity: 5/10 Quality: dull, aching, and throbbing Frequency: constant -- takes Tylenol and will stop for awhile Location: frontal aspect and then around to right side Headache  duration: 24 hours Radiation: no Time of day headache occurs: all day Alleviating factors: Tylenol and Toradol Aggravating factors: coughing  Headache status at time of visit: current headache Treatments attempted: Treatments attempted: rest and APAP   Aura: no Nausea:  no Vomiting: no Photophobia:  no Phonophobia:  no Effect on social functioning:  no Numbers of missed days of school/work  each month: none Confusion:  no Gait disturbance/ataxia:  no Behavioral changes:  no Fevers:  no   Relevant past medical, surgical, family and social history reviewed and updated as indicated. Interim medical history since our last visit reviewed. Allergies and medications reviewed and updated.  Review of Systems  Constitutional:  Negative for activity change, diaphoresis, fatigue and fever.  Respiratory:  Negative for cough, chest tightness, shortness of breath and wheezing.   Cardiovascular:  Negative for chest pain, palpitations and leg swelling.  Gastrointestinal: Negative.   Endocrine: Negative for cold intolerance, heat intolerance, polydipsia, polyphagia and polyuria.  Neurological:  Positive for headaches. Negative for dizziness, syncope, facial asymmetry, speech difficulty, weakness, light-headedness and numbness.  Psychiatric/Behavioral: Negative.     Per HPI unless specifically indicated above     Objective:    BP 134/78 (BP Location: Left Arm)   Pulse 69   Temp (!) 97.3 F (36.3 C) (Oral)   Wt 181 lb 3.2 oz (82.2 kg)   SpO2 98%   BMI 26.76 kg/m   Wt Readings from Last 3 Encounters:  10/15/20 181 lb 3.2 oz (82.2 kg)  10/08/20 178 lb 2 oz (80.8 kg)  08/31/20 180 lb (81.6 kg)    Physical Exam Vitals and nursing note reviewed.  Constitutional:      General: He is awake. He is not in acute distress.    Appearance: He is well-developed and well-groomed. He is not ill-appearing or toxic-appearing.  HENT:     Head: Normocephalic and atraumatic.     Right Ear: Hearing, tympanic membrane, ear canal and external ear normal. No drainage.     Left Ear: Hearing, tympanic membrane, ear canal and external ear normal. No drainage.     Nose:     Right Sinus: No maxillary sinus tenderness or frontal sinus tenderness.     Left Sinus: No maxillary sinus tenderness or frontal sinus tenderness.  Eyes:     General: Lids are normal.        Right eye: No discharge.        Left  eye: No discharge.     Extraocular Movements: Extraocular movements intact.     Conjunctiva/sclera: Conjunctivae normal.     Pupils: Pupils are equal, round, and reactive to light.     Visual Fields: Right eye visual fields normal and left eye visual fields normal.  Neck:     Thyroid: No thyromegaly.     Vascular: Carotid bruit (left side noted) present.  Cardiovascular:     Rate and Rhythm: Normal rate and regular rhythm.     Heart sounds: Normal heart sounds, S1 normal and S2 normal. No murmur heard.   No gallop.  Pulmonary:     Effort: Pulmonary effort is normal. No accessory muscle usage or respiratory distress.     Breath sounds: Decreased breath sounds and wheezing present.     Comments: Decreased sounds throughout with expiratory wheezes per baseline. Abdominal:     General: Bowel sounds are normal.     Palpations: Abdomen is soft.  Musculoskeletal:        General: Normal range of motion.  Cervical back: Normal range of motion and neck supple.     Right lower leg: No edema.     Left lower leg: No edema.  Lymphadenopathy:     Cervical: No cervical adenopathy.  Skin:    General: Skin is warm and dry.  Neurological:     Mental Status: He is alert and oriented to person, place, and time.     Cranial Nerves: Cranial nerves are intact.     Motor: Motor function is intact.     Coordination: Coordination is intact.     Gait: Gait is intact.     Deep Tendon Reflexes: Reflexes are normal and symmetric.     Reflex Scores:      Brachioradialis reflexes are 2+ on the right side and 2+ on the left side.      Patellar reflexes are 2+ on the right side and 2+ on the left side. Psychiatric:        Attention and Perception: Attention normal.        Mood and Affect: Mood normal.        Speech: Speech normal.        Behavior: Behavior normal. Behavior is cooperative.        Thought Content: Thought content normal.   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  Home BP at goal.  Continue current medication regimen and collaboration with cardiology.  CMP today.  Recommend focus on DASH diet at home and continue to monitor BP and weight closely. Refills sent in as needed.  Return in 6 months.       Chronic systolic heart failure (HCC)    Chronic, ongoing.  Euvolemic today. . Continue current medication regimen and collaboration with cardiology.  CMP today.  Recommend: - Reminded to call for an overnight weight gain of >2 pounds or a weekly weight weight of >5 pounds - not adding salt to his food and has been reading food labels. Reviewed the importance of keeping daily sodium intake to 2000mg  daily        Aortic atherosclerosis (HCC)    Noted on past CT imaging of lungs.  Recommend to continue statin and ASA daily for prevention.  Recommend complete cessation of smoking.         Respiratory   Centrilobular emphysema (HCC)    Chronic, ongoing.  Continue current medication regimen.  Recommend complete smoking cessation.  Consider addition of Albuterol at next visit as rescue inhaler, he refuses this being sent in today.  At this time he denies symptoms.  Plan on spirometry next visit, discussed with patient.       Relevant Medications   predniSONE (DELTASONE) 20 MG tablet   Lung cancer ([place])    Continue collaboration with oncology.  Recommend complete cessation of smoking.       Relevant Medications   predniSONE (DELTASONE) 20 MG tablet     Endocrine   Diabetes mellitus with chronic kidney disease ([place]) - Primary    Chronic, ongoing.  A1C 5.9% today, remaining  very stable.  Urine ALB 80  and A:C 30-300 in February 2022, continue Ramipril for kidney protection.  Continue current medication regimen and adjust as needed.  Recommend he check BS at home at least a few mornings a week.  Consider reduction of medication next visit, if remains stable.  CMP today.  Return in 6 months.       Relevant Orders   Bayer DCA Hb A1c Waived (Completed)   CBC with Differential/Platelet   Comprehensive metabolic panel   Hyperlipidemia associated with type 2 diabetes mellitus (HCC)    Chronic, ongoing.  Continue current medication regimen and adjust as needed.  Lipid panel up to date.         Nervous and Auditory   Nicotine dependence, cigarettes, w unsp disorders    I have recommended complete cessation of tobacco use. I have discussed various options available for assistance with tobacco cessation including over the counter methods (Nicotine gum, patch and lozenges). We also discussed prescription options (Chantix, Nicotine Inhaler / Nasal Spray). The patient is not interested in pursuing any prescription tobacco cessation options at this time.          Genitourinary   CKD (chronic kidney disease), stage III ([place])    Recently has been improved on labs since November 2020.  Continue to monitor closely for decline.  Continue Ramipril for protection.         Other   B12 deficiency    Noted on February labs at 235, recommend supplement daily and recheck today.       Relevant Orders   Vitamin B12   Bruit of left carotid artery    Noted on exam today with new onset of headache -- STAT MRI brain and urgent referral to vascular placed.  Continue all current medications and recommend complete cessation of smoking.  Normal neuro exam today.  Return in one week.  Recommend immediately go to ER if any worsening neuro symptoms present.       Relevant Orders   Ambulatory referral to Vascular Surgery   New onset headache    Ongoing x 1 week in patient >65 with no history of migraine  headaches or headache in 20 years.  Reports this is worst headache of life.  Tylenol offers minimal benefit.  Normal neuro exam today, but left carotid bruit noted.  Will order STAT MRI brain and urgent referral to vascular to further assess.  Labs today and will send in burst of Prednisone (recent elevation CRP with headache) and Tizanidine to take as needed for discomfort.  Return in one week, if worsening symptoms immediately go to ER.       Relevant Medications   tiZANidine (ZANAFLEX) 4 MG tablet   Other Relevant Orders   MR Brain W Wo Contrast   Ambulatory referral to Vascular Surgery   Other Visit Diagnoses     Elevated C-reactive protein (CRP)       Noted recent labs, recheck today.   Relevant Orders   C-reactive protein        Follow up plan: Return in about 1 week (around 10/22/2020) for Headache and bruit.

## 2020-10-15 NOTE — Assessment & Plan Note (Signed)
Recently has been improved on labs since November 2020.  Continue to monitor closely for decline.  Continue Ramipril for protection.

## 2020-10-15 NOTE — Assessment & Plan Note (Signed)
Chronic, ongoing.  Continue current medication regimen.  Recommend complete smoking cessation.  Consider addition of Albuterol at next visit as rescue inhaler, he refuses this being sent in today.  At this time he denies symptoms.  Plan on spirometry next visit, discussed with patient.

## 2020-10-15 NOTE — Assessment & Plan Note (Signed)
Chronic, ongoing.  Euvolemic today. . Continue current medication regimen and collaboration with cardiology.  CMP today.  Recommend: - Reminded to call for an overnight weight gain of >2 pounds or a weekly weight weight of >5 pounds - not adding salt to his food and has been reading food labels. Reviewed the importance of keeping daily sodium intake to 2000mg  daily

## 2020-10-15 NOTE — Assessment & Plan Note (Signed)
Noted on exam today with new onset of headache -- STAT MRI brain and urgent referral to vascular placed.  Continue all current medications and recommend complete cessation of smoking.  Normal neuro exam today.  Return in one week.  Recommend immediately go to ER if any worsening neuro symptoms present.

## 2020-10-15 NOTE — Assessment & Plan Note (Signed)
Continue collaboration with oncology.  Recommend complete cessation of smoking.

## 2020-10-15 NOTE — Telephone Encounter (Signed)
Copied from [place] [id number]. Topic: General - Other >> Oct 15, 2020  8:36 AM Leward Quan A wrote: Reason for CRM: Patient called in to tell provider that shot did not work for his headache and he was told to call if this happened. Please advise patient asking for a call back today to know what is the next step. Can be reached at Ph# [phone]  WOULD PT NEED APT?

## 2020-10-15 NOTE — Assessment & Plan Note (Signed)
Chronic, ongoing.  Continue current medication regimen and adjust as needed.  Lipid panel up to date.

## 2020-10-15 NOTE — Patient Instructions (Signed)
General Headache Without Cause A headache is pain or discomfort that is felt around the head or neck area. There are many causes and types of headaches. In some cases, the cause may not be found. Follow these instructions at home: Watch your condition for any changes. Let your doctor know about them. Take these steps to help with your condition: Managing pain   Take over-the-counter and prescription medicines only as told by your doctor. Lie down in a dark, quiet room when you have a headache. If told, put ice on your head and neck area: Put ice in a plastic bag. Place a towel between your skin and the bag. Leave the ice on for 20 minutes, 2-3 times per day. If told, put heat on the affected area. Use the heat source that your doctor recommends, such as a moist heat pack or a heating pad. Place a towel between your skin and the heat source. Leave the heat on for 20-30 minutes. Remove the heat if your skin turns bright red. This is very important if you are unable to feel pain, heat, or cold. You may have a greater risk of getting burned. Keep lights dim if bright lights bother you or make your headaches worse. Eating and drinking Eat meals on a regular schedule. If you drink alcohol: Limit how much you use to: 0-1 drink a day for women. 0-2 drinks a day for men. Be aware of how much alcohol is in your drink. In the [place] one drink equals one 12 oz bottle of beer (355 mL), one 5 oz glass of wine (148 mL), or one 1 oz glass of hard liquor (44 mL). Stop drinking caffeine, or reduce how much caffeine you drink. General instructions  Keep a journal to find out if certain things bring on headaches. For example, write down: What you eat and drink. How much sleep you get. Any change to your diet or medicines. Get a massage or try other ways to relax. Limit stress. Sit up straight. Do not tighten (tense) your muscles. Do not use any products that contain nicotine or tobacco. This includes  cigarettes, e-cigarettes, and chewing tobacco. If you need help quitting, ask your doctor. Exercise regularly as told by your doctor. Get enough sleep. This often means 7-9 hours of sleep each night. Keep all follow-up visits as told by your doctor. This is important. Contact a doctor if: Your symptoms are not helped by medicine. You have a headache that feels different than the other headaches. You feel sick to your stomach (nauseous) or you throw up (vomit). You have a fever. Get help right away if: Your headache gets very bad quickly. Your headache gets worse after a lot of physical activity. You keep throwing up. You have a stiff neck. You have trouble seeing. You have trouble speaking. You have pain in the eye or ear. Your muscles are weak or you lose muscle control. You lose your balance or have trouble walking. You feel like you will pass out (faint) or you pass out. You are mixed up (confused). You have a seizure. Summary A headache is pain or discomfort that is felt around the head or neck area. There are many causes and types of headaches. In some cases, the cause may not be found. Keep a journal to help find out what causes your headaches. Watch your condition for any changes. Let your doctor know about them. Contact a doctor if you have a headache that is different from usual, or   if your headache is not helped by medicine. Get help right away if your headache gets very bad, you throw up, you have trouble seeing, you lose your balance, or you have a seizure. This information is not intended to replace advice given to you by your health care provider. Make sure you discuss any questions you have with your health care provider. Document Revised: 10/26/2017 Document Reviewed: 10/26/2017 Elsevier Patient Education  2022 [employer]  

## 2020-10-15 NOTE — Assessment & Plan Note (Signed)
Noted on February labs at 235, recommend supplement daily and recheck today.

## 2020-10-15 NOTE — Assessment & Plan Note (Signed)
Chronic, ongoing.  A1C 5.9% today, remaining very stable.  Urine ALB 80 and A:C 30-300 in February 2022, continue Ramipril for kidney protection.  Continue current medication regimen and adjust as needed.  Recommend he check BS at home at least a few mornings a week.  Consider reduction of medication next visit, if remains stable.  CMP today.  Return in 6 months.

## 2020-10-15 NOTE — Assessment & Plan Note (Signed)
Ongoing x 1 week in patient >65 with no history of migraine headaches or headache in 20 years.  Reports this is worst headache of life.  Tylenol offers minimal benefit.  Normal neuro exam today, but left carotid bruit noted.  Will order STAT MRI brain and urgent referral to vascular to further assess.  Labs today and will send in burst of Prednisone (recent elevation CRP with headache) and Tizanidine to take as needed for discomfort.  Return in one week, if worsening symptoms immediately go to ER.

## 2020-10-15 NOTE — Assessment & Plan Note (Signed)
Chronic, ongoing with initial elevation in BP today and repeat at goal.  Home BP at goal.  Continue current medication regimen and collaboration with cardiology.  CMP today.  Recommend focus on DASH diet at home and continue to monitor BP and weight closely. Refills sent in as needed.  Return in 6 months.

## 2020-10-15 NOTE — Assessment & Plan Note (Signed)
Noted on past CT imaging of lungs.  Recommend to continue statin and ASA daily for prevention.  Recommend complete cessation of smoking.

## 2020-10-16 ENCOUNTER — Ambulatory Visit
Admission: RE | Admit: 2020-10-16 | Discharge: 2020-10-16 | Disposition: A | Discharge status: Home / Self Care | Payer: Medicare Other | Source: Ambulatory Visit | Attending: Nurse Practitioner | Admitting: Nurse Practitioner

## 2020-10-16 DIAGNOSIS — G319 Degenerative disease of nervous system, unspecified: Secondary | ICD-10-CM | POA: Diagnosis not present

## 2020-10-16 DIAGNOSIS — R519 Headache, unspecified: Secondary | ICD-10-CM | POA: Diagnosis not present

## 2020-10-16 DIAGNOSIS — J019 Acute sinusitis, unspecified: Secondary | ICD-10-CM | POA: Diagnosis not present

## 2020-10-16 LAB — CBC WITH DIFFERENTIAL/PLATELET
Basophils Absolute: 0 10*3/uL (ref 0.0–0.2)
Basos: 0 %
EOS (ABSOLUTE): 0.8 10*3/uL — ABNORMAL HIGH (ref 0.0–0.4)
Eos: 12 %
Hematocrit: 43.8 % (ref 37.5–51.0)
Hemoglobin: 14.9 g/dL (ref 13.0–17.7)
Immature Grans (Abs): 0 10*3/uL (ref 0.0–0.1)
Immature Granulocytes: 0 %
Lymphocytes Absolute: 1.7 10*3/uL (ref 0.7–3.1)
Lymphs: 25 %
MCH: 31.7 pg (ref 26.6–33.0)
MCHC: 34 g/dL (ref 31.5–35.7)
MCV: 93 fL (ref 79–97)
Monocytes Absolute: 0.5 10*3/uL (ref 0.1–0.9)
Monocytes: 7 %
Neutrophils Absolute: 3.7 10*3/uL (ref 1.4–7.0)
Neutrophils: 56 %
Platelets: 168 10*3/uL (ref 150–450)
RBC: 4.7 x10E6/uL (ref 4.14–5.80)
RDW: 13.5 % (ref 11.6–15.4)
WBC: 6.7 10*3/uL (ref 3.4–10.8)

## 2020-10-16 LAB — COMPREHENSIVE METABOLIC PANEL
ALT: 11 IU/L (ref 0–44)
AST: 11 IU/L (ref 0–40)
Albumin/Globulin Ratio: 2 (ref 1.2–2.2)
Albumin: 3.9 g/dL (ref 3.7–4.7)
Alkaline Phosphatase: 108 IU/L (ref 44–121)
BUN/Creatinine Ratio: 15 (ref 10–24)
BUN: 15 mg/dL (ref 8–27)
Bilirubin Total: 0.2 mg/dL (ref 0.0–1.2)
CO2: 24 mmol/L (ref 20–29)
Calcium: 8.9 mg/dL (ref 8.6–10.2)
Chloride: 99 mmol/L (ref 96–106)
Creatinine, Ser: 1.01 mg/dL (ref 0.76–1.27)
Globulin, Total: 2 g/dL (ref 1.5–4.5)
Glucose: 101 mg/dL — ABNORMAL HIGH (ref 65–99)
Potassium: 4.4 mmol/L (ref 3.5–5.2)
Sodium: 139 mmol/L (ref 134–144)
Total Protein: 5.9 g/dL — ABNORMAL LOW (ref 6.0–8.5)
eGFR: 78 mL/min/{1.73_m2} (ref >59)

## 2020-10-16 LAB — C-REACTIVE PROTEIN: CRP: 17 mg/L — ABNORMAL HIGH (ref 0–10)

## 2020-10-16 LAB — VITAMIN B12: Vitamin B-12: 315 pg/mL (ref 232–1245)

## 2020-10-16 IMAGING — MR MR HEAD WO/W CM
15 series · 48 of 48 positions shown · IV contrast (gadavist)
Comparison: No pertinent prior exams available for comparison.

CLINICAL DATA: Headache, new or worsening. Ongoing headache for 1
week. Additional history provided: Right-sided headache for 3 weeks,
history of lung and throat cancer.

EXAM:
MRI HEAD WITHOUT AND WITH CONTRAST
TECHNIQUE: Multiplanar, multiecho pulse sequences of the brain and surrounding
structures were obtained without and with intravenous contrast.
CONTRAST:  8mL GADAVIST GADOBUTROL 1 MMOL/ML IV SOLN

[Series 5: ax dwi_tracew · axial · 3.0mm · 0.65mm/px · z∈[-95,+59]mm · 4 of 48 slices shown]
[im 1/48]
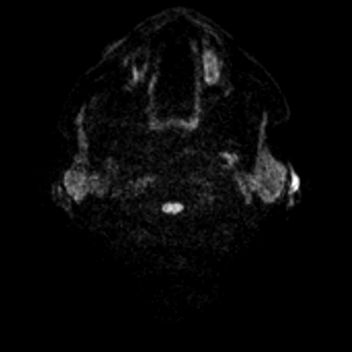
[im 16/48]
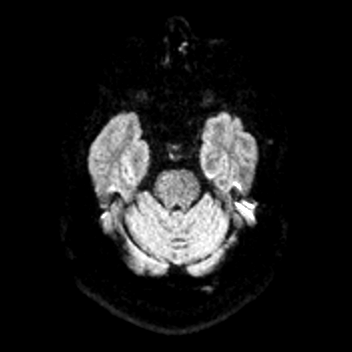
[im 32/48]
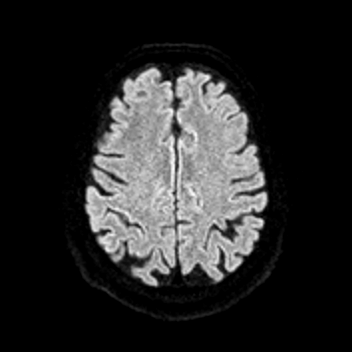
[im 48/48]
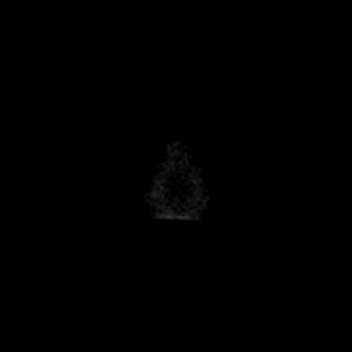

[Series 6: ax dwi_adc · axial · 3.0mm · 0.65mm/px · z∈[-95,+59]mm · 3 of 48 slices shown]
[im 1/48]
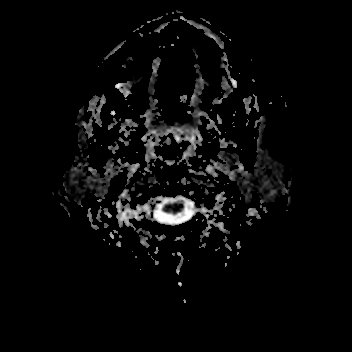
[im 24/48]
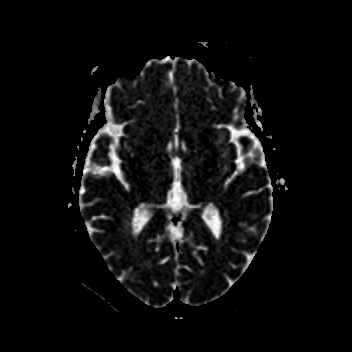
[im 48/48]
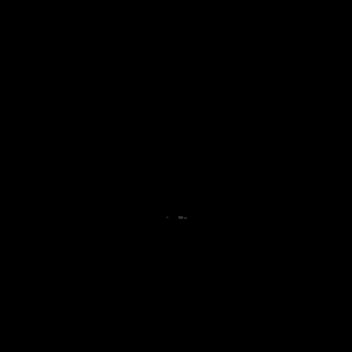

[Series 7: cor dwi_tracew · coronal · 5.0mm · 0.65mm/px · 2 of 40 slices shown]
[im 1/40]
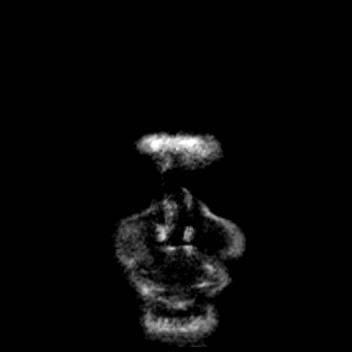
[im 40/40]
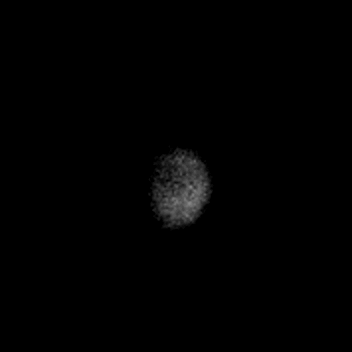

[Series 8: cor dwi_adc · coronal · 5.0mm · 0.65mm/px · 2 of 39 slices shown]
[im 1/39]
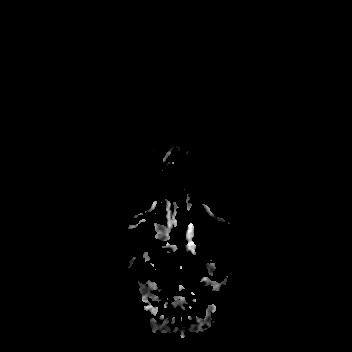
[im 39/39]
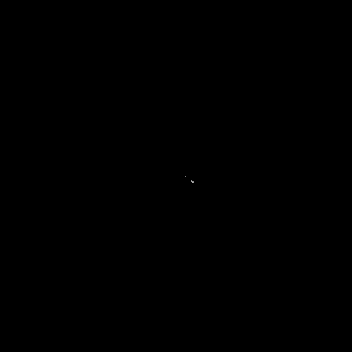

[Series 9: T1 · sagittal · 5.0mm · 0.62mm/px · 1 of 25 slices shown (1 of 2)]
[im 1/25]
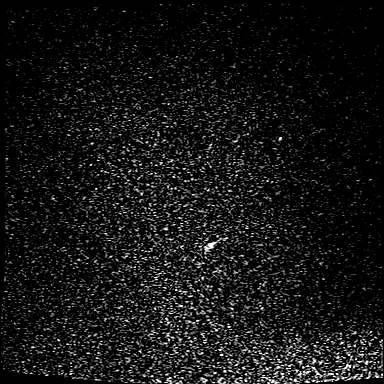

[Series 10: T2 · axial · 5.0mm · 0.53mm/px · 1 of 25 slices shown]
[im 1/25]
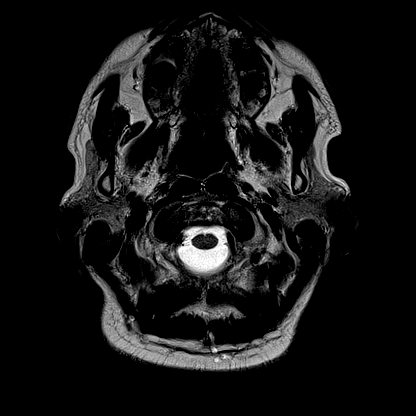

[Series 11: mag_images · axial · 3.0mm · 0.90mm/px · z∈[-104,+72]mm · 3 of 60 slices shown]
[im 1/60]
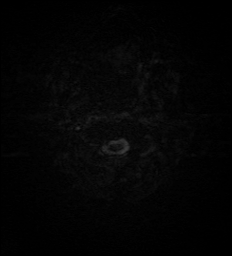
[im 30/60]
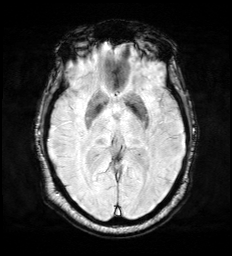
[im 60/60]
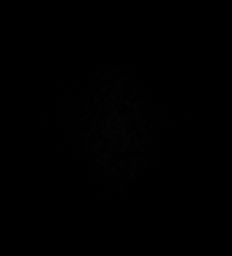

[Series 12: pha_images · axial · 3.0mm · 0.90mm/px · z∈[-101,+72]mm · 3 of 59 slices shown]
[im 1/59]
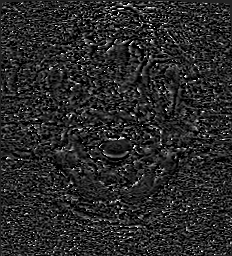
[im 30/59]
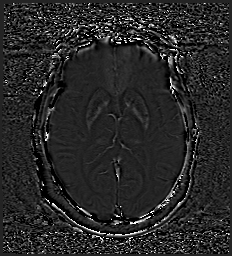
[im 59/59]
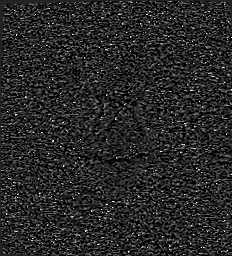

[Series 13: swi_images · axial · 3.0mm · 0.90mm/px · z∈[-104,+72]mm · 3 of 60 slices shown]
[im 1/60]
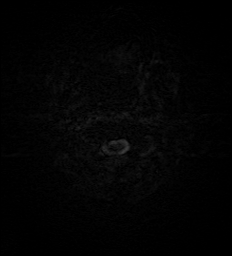
[im 30/60]
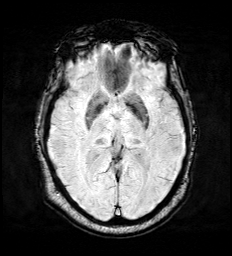
[im 60/60]
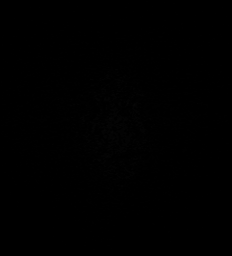

[Series 15: FLAIR · axial · 3.0mm · 0.53mm/px · z∈[-97,+64]mm · 3 of 55 slices shown]
[im 1/55]
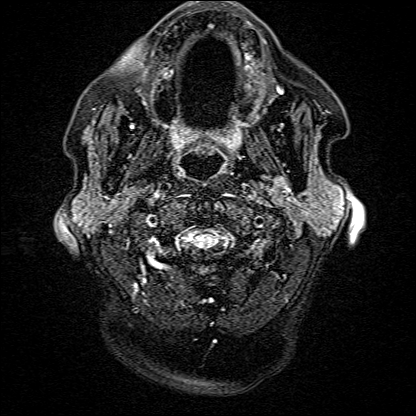
[im 28/55]
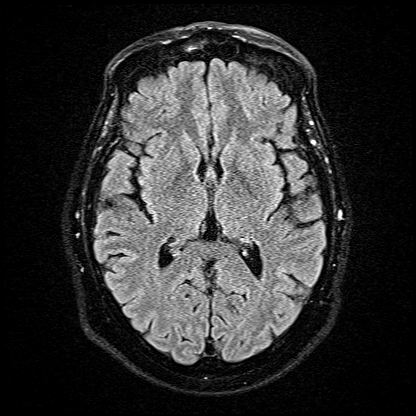
[im 55/55]
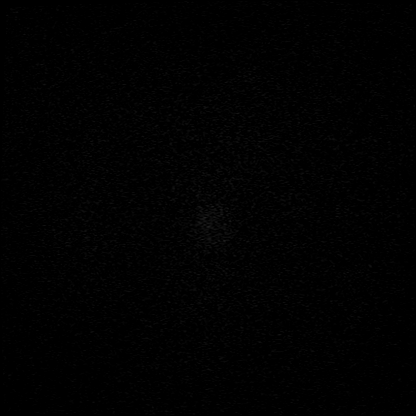

[Series 16: T1 · axial · 1.0mm · 0.98mm/px · z∈[-109,+65]mm · 9 of 175 slices shown (2 of 2)]
[im 1/175]
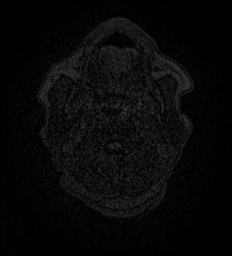
[im 22/175]
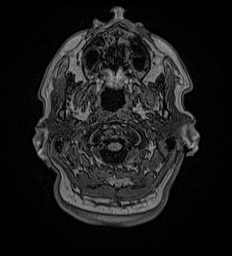
[im 44/175]
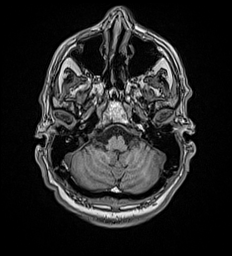
[im 66/175]
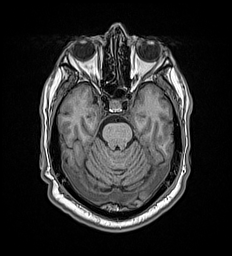
[im 88/175]
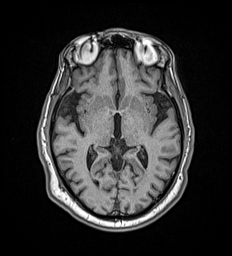
[im 109/175]
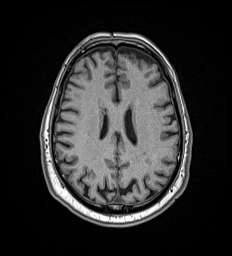
[im 131/175]
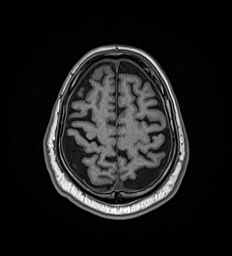
[im 153/175]
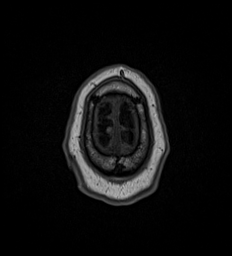
[im 175/175]
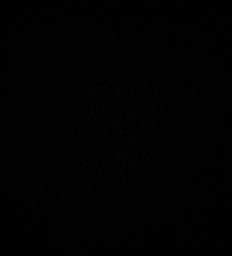

[Series 17: T2 post-contrast · coronal · 5.0mm · 0.57mm/px · 2 of 29 slices shown]
[im 1/29]
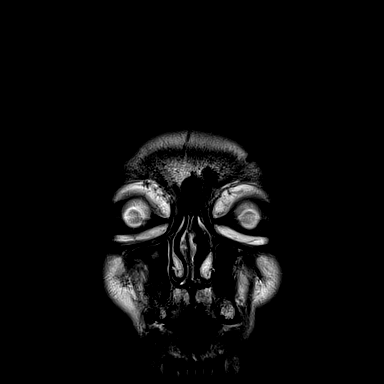
[im 29/29]
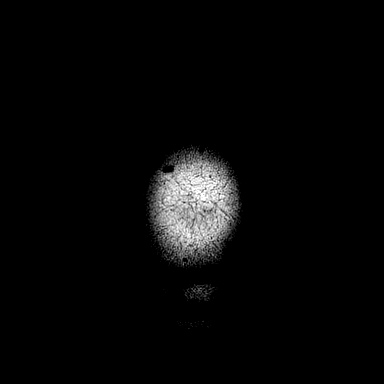

[Series 18: T1 post-contrast · axial · 1.0mm · 0.98mm/px · z∈[-109,+65]mm · 9 of 176 slices shown (1 of 3)]
[im 1/176]
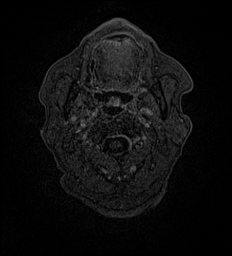
[im 22/176]
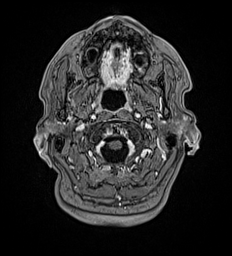
[im 44/176]
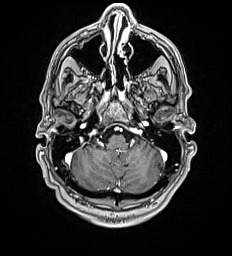
[im 66/176]
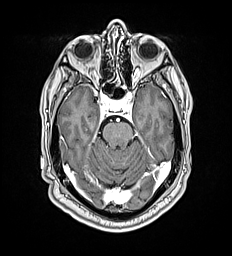
[im 88/176]
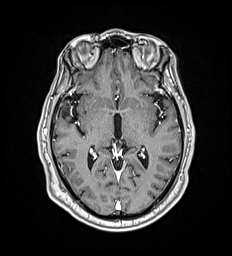
[im 110/176]
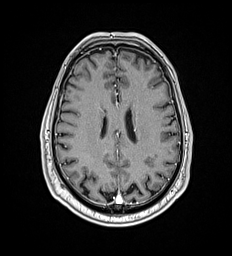
[im 132/176]
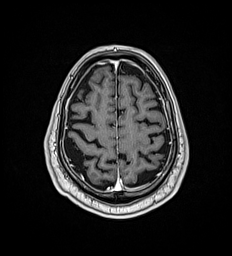
[im 154/176]
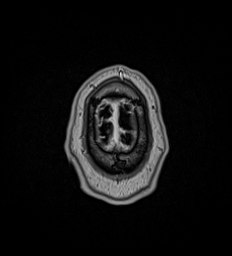
[im 176/176]
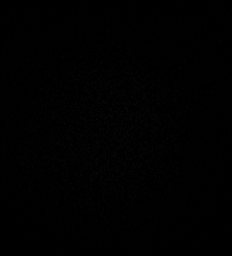

[Series 19: T1 post-contrast · coronal · 5.0mm · 0.57mm/px · 2 of 29 slices shown (2 of 3)]
[im 1/29]
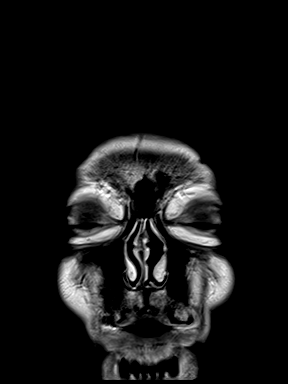
[im 29/29]
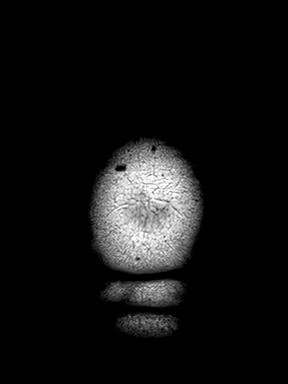

[Series 20: T1 post-contrast · sagittal · 5.0mm · 0.62mm/px · 1 of 25 slices shown (3 of 3)]
[im 1/25]
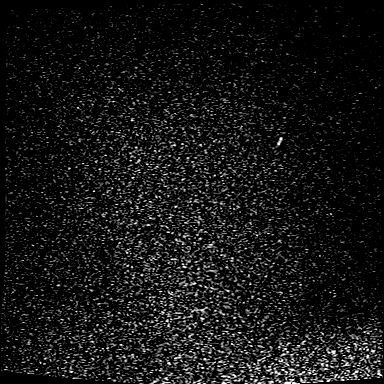

[48 of 48 positions shown; findings below may reference images not displayed]

FINDINGS: Brain:

Mild generalized cerebral and cerebellar atrophy.

There are a few small scattered foci of T2/FLAIR hyperintensity
within the bilateral cerebral white matter, nonspecific but likely
reflecting minimal chronic small vessel ischemic disease.

There is no acute infarct.

No evidence of an intracranial mass.

No chronic intracranial blood products.

No extra-axial fluid collection.

No midline shift.

No abnormal intracranial enhancement.

Vascular: Expected proximal arterial flow voids.

Skull and upper cervical spine: No focal marrow lesion.

Sinuses/Orbits: Visualized orbits show no acute finding. Trace
bilateral ethmoid, right sphenoid and bilateral maxillary sinus
mucosal thickening.
IMPRESSION: No evidence of acute intracranial abnormality.

No evidence of intracranial metastatic disease.

Minimal chronic small-vessel ischemic changes within the cerebral
white matter.

Mild generalized parenchymal atrophy.

Minimal paranasal sinus disease, as described.

## 2020-10-16 MED ORDER — GADOBUTROL 1 MMOL/ML IV SOLN
8.0000 mL | Freq: Once | INTRAVENOUS | Status: AC | PRN
  Administered 2020-10-16: 8 mL via INTRAVENOUS

## 2020-10-16 NOTE — Progress Notes (Signed)
Please let Philip Wood know his labs returned and overall CBC shows no anemia, kidney and liver function are normal.  B12 level remains on lower side of normal, please start taking Vitamin B12 500 to 1000 MCG daily, which you can obtain in any vitamin section.  This is good for nervous system health.  We will recheck next visit.  Your one inflammatory marker remains slightly elevated, will recheck next visit.  I will alert you to MRI results once returned.  Any questions? Keep being awesome!!  Thank you for allowing me to participate in your care.  I appreciate you. Kindest regards, Jackline Castilla

## 2020-10-16 NOTE — Progress Notes (Signed)
Please let Mr. Demello know I have good news. His MRI shows now signs of stroke.  There is some chronic vessel changes which we see with aging and the brain is becoming a bit smaller, but no stroke or bleed, which is very good news.  I recommend scheduled with vascular ASAP and if you do not hear from them let me know right away.  Any questions?  Also keep follow-up here. Keep being awesome!!  Thank you for allowing me to participate in your care.  I appreciate you. Kindest regards, Zalmen Wrightsman

## 2020-10-19 DIAGNOSIS — H5713 Ocular pain, bilateral: Secondary | ICD-10-CM | POA: Diagnosis not present

## 2020-10-19 LAB — HM DIABETES EYE EXAM

## 2020-10-24 ENCOUNTER — Ambulatory Visit (INDEPENDENT_AMBULATORY_CARE_PROVIDER_SITE_OTHER): Payer: Medicare Other | Admitting: Nurse Practitioner

## 2020-10-24 ENCOUNTER — Encounter: Payer: Self-pay | Admitting: Nurse Practitioner

## 2020-10-24 ENCOUNTER — Other Ambulatory Visit: Payer: Self-pay

## 2020-10-24 VITALS — BP 139/82 | HR 71 | Temp 98.8°F | Wt 181.4 lb

## 2020-10-24 DIAGNOSIS — F17219 Nicotine dependence, cigarettes, with unspecified nicotine-induced disorders: Secondary | ICD-10-CM

## 2020-10-24 DIAGNOSIS — R0989 Other specified symptoms and signs involving the circulatory and respiratory systems: Secondary | ICD-10-CM | POA: Diagnosis not present

## 2020-10-24 DIAGNOSIS — R519 Headache, unspecified: Secondary | ICD-10-CM | POA: Diagnosis not present

## 2020-10-24 MED ORDER — GABAPENTIN 600 MG PO TABS: 300.0000 mg | ORAL_TABLET | Freq: Every day | ORAL | 2 refills | Status: DC

## 2020-10-24 NOTE — Patient Instructions (Signed)
Cluster Headache Cluster headaches hurt a lot. They normally happen on one side of your head, but they may switch sides. Often, cluster headaches: Cause a lot of pain. Happen for weeks to months. Last from 15 minutes to 3 hours. Happen at the same time each day. Happen at night. Happen many times a day. Happen more often in the fall and springtime. What are the causes? The exact cause is not known. They are not usually caused by foods, changes in body chemicals (hormonalchanges), or stress. What increases the risk? Being a male between the ages of 34-79 years old. Smoking or using products that contain nicotine or tobacco. Having elevated levels of body chemical called histamine. This can happen in people who have allergies. Taking certain medicines that cause blood vessels to expand. Having a parent or brother or sister who has cluster headaches. What are the signs or symptoms? Very bad pain on one side of the head that begins behind or around your eye but may spread to your face, head, and neck. Feeling like you may vomit (nauseous). Being sensitive to light. Runny nose and stuffy nose. Swelling of the forehead or face on the affected side. Eye problems. This might include a droopy or swollen eyelid, eye redness, or tearing on the affected side. Feeling restless or upset. Pale skin or a flushed face. How is this treated? Medicines. Oxygen that is breathed in through a mask. Follow these instructions at home: Headache diary Keep a headache diary as told by your doctor. Doing this can help you and your doctor figure out what triggers your headaches. In your headache diary, include information about: The time of day that your headache started and what you were doing when it began. How long your headache lasted. Where your pain started and whether it moved to other areas. The type of pain. Your level of pain. Use a pain scale and rate the pain with a number from 1 (mild) up to 10  (very bad). The treatment that you used, and any change in symptoms after treatment.  Medicines Take over-the-counter and prescription medicines only as told by your health care provider. Ask your doctor if the medicine prescribed to you: Requires you to avoid driving or using machinery. Can cause trouble pooping (constipation). You may need to take these actions to prevent or treat trouble pooping: Drink enough fluid to keep your pee (urine) pale yellow. Take over-the-counter or prescription medicines. Eat foods that are high in fiber. These include beans, whole grains, and fresh fruits and vegetables. Limit foods that are high in fat and processed sugars. These include fried or sweet foods. Lifestyle  Go to bed at the same time each night. Get the same amount of sleep every night. Get 7-9 hours of sleep each night, or the amount recommended by your doctor. Limit or manage stress. Exercise regularly. Exercise for at least 30 minutes, 5 times each week. Eat a healthy diet. Avoid any foods that you know may trigger your headaches. Do not drink alcohol. Do not use any products that contain nicotine or tobacco, such as cigarettes, e-cigarettes, and chewing tobacco. If you need help quitting, ask your doctor.  General instructions Use oxygen as told by your doctor. Keep all follow-up visits as told by your health care provider. This is important. Contact a doctor if: Your headaches: Change. Get worse. Happen more often. Your medicines or oxygen are not helping. Get help right away if: You faint. You get weak or lose feeling (have  numbness) on one side of your body or face. You see two of everything (double vision). You feel you may vomit or you vomit and it does stop after many hours. You have trouble with your balance or with walking. You have trouble talking. You have neck pain or stiffness and you have a fever. Summary Cluster headaches hurt a lot. Keep a headache diary. Do  not drink alcohol. Medicines and oxygen may help you feel better. This information is not intended to replace advice given to you by your health care provider. Make sure you discuss any questions you have with your healthcare provider. Document Revised: 12/09/2019 Document Reviewed: 05/12/2019 Elsevier Patient Education  2022 [employer].

## 2020-10-24 NOTE — Assessment & Plan Note (Signed)
I have recommended complete cessation of tobacco use. I have discussed various options available for assistance with tobacco cessation including over the counter methods (Nicotine gum, patch and lozenges). We also discussed prescription options (Chantix, Nicotine Inhaler / Nasal Spray). The patient is not interested in pursuing any prescription tobacco cessation options at this time.  

## 2020-10-24 NOTE — Assessment & Plan Note (Signed)
Scheduled to see vascular next week, appreciate their input.  Recent MRI showing no acute CVA.

## 2020-10-24 NOTE — Progress Notes (Signed)
BP 139/82 (BP Location: Left Wrist, Cuff Size: Normal)   Pulse 71   Temp 98.8 F (37.1 C)   Wt 181 lb 6.4 oz (82.3 kg)   SpO2 97%   BMI 26.79 kg/m    Subjective:    Patient ID: Philip Wood, male    DOB: 24-Sep-1945, 75 y.o.   MRN: [id number]  HPI: Philip Wood is a 75 y.o. male  Chief Complaint  Patient presents with   Headache    Patient states he is still experiencing the headaches and he has tried everything and nothing seems to be helping. Patient states the headache never goes away, just easy off. Patient states last Sunday was one of his worse days, as the Tylenol didn't even help ease the headache off.    HEADACHES Has not had headache in 20 years since he stopped drinking.  Went to eye doctor about 4 weeks ago and everything was normal, not felt to be vision related.  Came into office on 20th, was given Toradol shot and this lasted only a couple days.    When he coughs it hurts the most to frontal aspect head and around to right side.  It is worse in the afternoon.  No recent URI and no history of allergies.  Does not use CPAP at home, reports no snoring.    Wakes up takes 2 Tylenol and then will be good until 12 PM, but this morning woke-up and did not need to take Tylenol and so far no headache.    Denies slurred speech, weakness, vision changes, amaurosis fugax, jaw claudication, or facial droop.  Recent mild elevation CRP noted 09/20/20 = 1.3.  Recent MRI on 10/16/20 -- noted no acute changes -- no CVA or bleed.  Some mild chronic small-vessel changes within cerebral white matter and generalized parenchymal atrophy.  He is scheduled to see vascular next week for bruit noted on exam last visit.   Duration: days Onset: sudden Severity: 5/10 Quality: dull, aching, and throbbing Frequency: constant -- takes Tylenol and will stop for awhile Location: frontal aspect and then around to right side Headache duration: 24 hours Radiation: no Time of day headache  occurs: all day Alleviating factors: Tylenol and Toradol Aggravating factors: coughing  Headache status at time of visit: no headache Treatments attempted: Treatments attempted: rest and APAP   Aura: no Nausea:  no Vomiting: no Photophobia:  no Phonophobia:  no Effect on social functioning:  no Numbers of missed days of school/work each month: none Confusion:  no Gait disturbance/ataxia:  no Behavioral changes:  no Fevers:  no   Relevant past medical, surgical, family and social history reviewed and updated as indicated. Interim medical history since our last visit reviewed. Allergies and medications reviewed and updated.  Review of Systems  Constitutional:  Negative for activity change, diaphoresis, fatigue and fever.  Respiratory:  Negative for cough, chest tightness, shortness of breath and wheezing.   Cardiovascular:  Negative for chest pain, palpitations and leg swelling.  Gastrointestinal: Negative.   Endocrine: Negative for cold intolerance, heat intolerance, polydipsia, polyphagia and polyuria.  Neurological:  Positive for headaches. Negative for dizziness, syncope, facial asymmetry, speech difficulty, weakness, light-headedness and numbness.  Psychiatric/Behavioral: Negative.     Per HPI unless specifically indicated above     Objective:    BP 139/82 (BP Location: Left Wrist, Cuff Size: Normal)   Pulse 71   Temp 98.8 F (37.1 C)   Wt 181 lb 6.4 oz (82.3 kg)  SpO2 97%   BMI 26.79 kg/m   Wt Readings from Last 3 Encounters:  10/24/20 181 lb 6.4 oz (82.3 kg)  10/15/20 181 lb 3.2 oz (82.2 kg)  10/08/20 178 lb 2 oz (80.8 kg)    Physical Exam Vitals and nursing note reviewed.  Constitutional:      General: He is awake. He is not in acute distress.    Appearance: He is well-developed and well-groomed. He is not ill-appearing or toxic-appearing.  HENT:     Head: Normocephalic and atraumatic.     Right Ear: Hearing, tympanic membrane, ear canal and external ear  normal. No drainage.     Left Ear: Hearing, tympanic membrane, ear canal and external ear normal. No drainage.     Nose:     Right Sinus: No maxillary sinus tenderness or frontal sinus tenderness.     Left Sinus: No maxillary sinus tenderness or frontal sinus tenderness.  Eyes:     General: Lids are normal.        Right eye: No discharge.        Left eye: No discharge.     Extraocular Movements: Extraocular movements intact.     Conjunctiva/sclera: Conjunctivae normal.     Pupils: Pupils are equal, round, and reactive to light.     Visual Fields: Right eye visual fields normal and left eye visual fields normal.  Neck:     Thyroid: No thyromegaly.     Vascular: Carotid bruit (left side noted) present.  Cardiovascular:     Rate and Rhythm: Normal rate and regular rhythm.     Heart sounds: Normal heart sounds, S1 normal and S2 normal. No murmur heard.   No gallop.  Pulmonary:     Effort: Pulmonary effort is normal. No accessory muscle usage or respiratory distress.     Breath sounds: Decreased breath sounds and wheezing present.     Comments: Decreased sounds throughout with expiratory wheezes per baseline. Abdominal:     General: Bowel sounds are normal.     Palpations: Abdomen is soft.  Musculoskeletal:        General: Normal range of motion.     Cervical back: Normal range of motion and neck supple.     Right lower leg: No edema.     Left lower leg: No edema.  Lymphadenopathy:     Cervical: No cervical adenopathy.  Skin:    General: Skin is warm and dry.  Neurological:     Mental Status: He is alert and oriented to person, place, and time.     Cranial Nerves: Cranial nerves are intact.     Motor: Motor function is intact.     Coordination: Coordination is intact.     Gait: Gait is intact.     Deep Tendon Reflexes: Reflexes are normal and symmetric.     Reflex Scores:      Brachioradialis reflexes are 2+ on the right side and 2+ on the left side.      Patellar reflexes are  2+ on the right side and 2+ on the left side. Psychiatric:        Attention and Perception: Attention normal.        Mood and Affect: Mood normal.        Speech: Speech normal.        Behavior: Behavior normal. Behavior is cooperative.        Thought Content: Thought content normal.   Results for orders placed or performed in visit on  10/15/20  Bayer DCA Hb A1c Waived  Result Value Ref Range   HB A1C (BAYER DCA - WAIVED) 5.9 <7.0 %  CBC with Differential/Platelet  Result Value Ref Range   WBC 6.7 3.4 - 10.8 x10E3/uL   RBC 4.70 4.14 - 5.80 x10E6/uL   Hemoglobin 14.9 13.0 - 17.7 g/dL   Hematocrit 43.8 37.5 - 51.0 %   MCV 93 79 - 97 fL   MCH 31.7 26.6 - 33.0 pg   MCHC 34.0 31.5 - 35.7 g/dL   RDW 13.5 11.6 - 15.4 %   Platelets 168 150 - 450 x10E3/uL   Neutrophils 56 Not Estab. %   Lymphs 25 Not Estab. %   Monocytes 7 Not Estab. %   Eos 12 Not Estab. %   Basos 0 Not Estab. %   Neutrophils Absolute 3.7 1.4 - 7.0 x10E3/uL   Lymphocytes Absolute 1.7 0.7 - 3.1 x10E3/uL   Monocytes Absolute 0.5 0.1 - 0.9 x10E3/uL   EOS (ABSOLUTE) 0.8 (H) 0.0 - 0.4 x10E3/uL   Basophils Absolute 0.0 0.0 - 0.2 x10E3/uL   Immature Granulocytes 0 Not Estab. %   Immature Grans (Abs) 0.0 0.0 - 0.1 x10E3/uL  Comprehensive metabolic panel  Result Value Ref Range   Glucose 101 (H) 65 - 99 mg/dL   BUN 15 8 - 27 mg/dL   Creatinine, Ser 1.01 0.76 - 1.27 mg/dL   eGFR 78 >59 mL/min/1.73   BUN/Creatinine Ratio 15 10 - 24   Sodium 139 134 - 144 mmol/L   Potassium 4.4 3.5 - 5.2 mmol/L   Chloride 99 96 - 106 mmol/L   CO2 24 20 - 29 mmol/L   Calcium 8.9 8.6 - 10.2 mg/dL   Total Protein 5.9 (L) 6.0 - 8.5 g/dL   Albumin 3.9 3.7 - 4.7 g/dL   Globulin, Total 2.0 1.5 - 4.5 g/dL   Albumin/Globulin Ratio 2.0 1.2 - 2.2   Bilirubin Total 0.2 0.0 - 1.2 mg/dL   Alkaline Phosphatase 108 44 - 121 IU/L   AST 11 0 - 40 IU/L   ALT 11 0 - 44 IU/L  Vitamin B12  Result Value Ref Range   Vitamin B-12 315 232 - 1,245 pg/mL   C-reactive protein  Result Value Ref Range   CRP 17 (H) 0 - 10 mg/L      Assessment & Plan:   Problem List Items Addressed This Visit       Nervous and Auditory   Nicotine dependence, cigarettes, w unsp disorders    I have recommended complete cessation of tobacco use. I have discussed various options available for assistance with tobacco cessation including over the counter methods (Nicotine gum, patch and lozenges). We also discussed prescription options (Chantix, Nicotine Inhaler / Nasal Spray). The patient is not interested in pursuing any prescription tobacco cessation options at this time.          Other   Bruit of left carotid artery    Scheduled to see vascular next week, appreciate their input.  Recent MRI showing no acute CVA.       New onset headache - Primary    Ongoing x 2 week in patient >65 with no history of migraine headaches or headache in 20 years.  MRI recently showing no acute findings.  Tylenol offers some benefit.  Normal neuro exam today, but left carotid bruit noted.  Labs up to date (recent elevation CRP with headache will recheck next visit) Prednisone bust offered minimal benefit and Tizanidine to take  as needed for discomfort offering minimal benefit.  Will trial Gabapentin 300 MG QHS to see if offers benefit, ?migraine type headache. Return in one week, if worsening symptoms immediately go to ER.       Relevant Medications   gabapentin (NEURONTIN) 600 MG tablet     Follow up plan: Return in about 8 days (around 11/01/2020) for Headache.

## 2020-10-24 NOTE — Assessment & Plan Note (Signed)
Ongoing x 2 week in patient >65 with no history of migraine headaches or headache in 20 years.  MRI recently showing no acute findings.  Tylenol offers some benefit.  Normal neuro exam today, but left carotid bruit noted.  Labs up to date (recent elevation CRP with headache will recheck next visit) Prednisone bust offered minimal benefit and Tizanidine to take as needed for discomfort offering minimal benefit.  Will trial Gabapentin 300 MG QHS to see if offers benefit, ?migraine type headache. Return in one week, if worsening symptoms immediately go to ER.

## 2020-10-30 ENCOUNTER — Other Ambulatory Visit (INDEPENDENT_AMBULATORY_CARE_PROVIDER_SITE_OTHER): Payer: Self-pay | Admitting: Nurse Practitioner

## 2020-10-30 DIAGNOSIS — R0989 Other specified symptoms and signs involving the circulatory and respiratory systems: Secondary | ICD-10-CM

## 2020-11-01 ENCOUNTER — Ambulatory Visit (INDEPENDENT_AMBULATORY_CARE_PROVIDER_SITE_OTHER): Payer: Medicare Other | Admitting: Nurse Practitioner

## 2020-11-01 ENCOUNTER — Encounter (INDEPENDENT_AMBULATORY_CARE_PROVIDER_SITE_OTHER): Payer: Self-pay | Admitting: Nurse Practitioner

## 2020-11-01 ENCOUNTER — Other Ambulatory Visit: Payer: Self-pay

## 2020-11-01 ENCOUNTER — Ambulatory Visit (INDEPENDENT_AMBULATORY_CARE_PROVIDER_SITE_OTHER): Payer: Medicare Other

## 2020-11-01 VITALS — BP 158/79 | HR 76 | Resp 16 | Ht 69.0 in | Wt 178.8 lb

## 2020-11-01 DIAGNOSIS — E1169 Type 2 diabetes mellitus with other specified complication: Secondary | ICD-10-CM | POA: Diagnosis not present

## 2020-11-01 DIAGNOSIS — E785 Hyperlipidemia, unspecified: Secondary | ICD-10-CM

## 2020-11-01 DIAGNOSIS — I6523 Occlusion and stenosis of bilateral carotid arteries: Secondary | ICD-10-CM | POA: Diagnosis not present

## 2020-11-01 DIAGNOSIS — R519 Headache, unspecified: Secondary | ICD-10-CM | POA: Diagnosis not present

## 2020-11-01 DIAGNOSIS — R0989 Other specified symptoms and signs involving the circulatory and respiratory systems: Secondary | ICD-10-CM

## 2020-11-02 ENCOUNTER — Ambulatory Visit (INDEPENDENT_AMBULATORY_CARE_PROVIDER_SITE_OTHER): Payer: Medicare Other | Admitting: Nurse Practitioner

## 2020-11-02 ENCOUNTER — Encounter: Payer: Self-pay | Admitting: Nurse Practitioner

## 2020-11-02 VITALS — BP 136/80 | HR 76 | Temp 98.8°F | Wt 178.0 lb

## 2020-11-02 DIAGNOSIS — R0989 Other specified symptoms and signs involving the circulatory and respiratory systems: Secondary | ICD-10-CM

## 2020-11-02 DIAGNOSIS — R519 Headache, unspecified: Secondary | ICD-10-CM | POA: Diagnosis not present

## 2020-11-02 DIAGNOSIS — E538 Deficiency of other specified B group vitamins: Secondary | ICD-10-CM | POA: Diagnosis not present

## 2020-11-02 NOTE — Patient Instructions (Signed)
Temporal Arteritis  Temporal arteritis is a condition that causes arteries to become inflamed. It usually affects arteries in your head and face, but arteries in any part of thebody can become inflamed. The condition is also called giant cell arteritis.  Temporal arteritis can cause serious problems such as blindness. Earlytreatment can help prevent these problems. What are the causes? The cause of this condition is not known. What increases the risk? The following factors may make you more likely to develop this condition: Being older than 50. Being a woman. Being Caucasian. Being of [place], [place], [place], [place] or [place] ancestry. Having a family history of the condition. Having a certain condition that causes muscle pain and stiffness (polymyalgia rheumatica, PMR). What are the signs or symptoms? Some people with temporal arteritis have just one symptom, while others have several symptoms. Most symptoms are related to the head and face. These may include: Headache. Hard or swollen temples. This is common. Your temples are the areas on either side of your forehead. If your temples are swollen, it may hurt to touch them. Pain when combing your hair or when laying your head down. Pain in the jaw when chewing. Pain in the throat or tongue. Problems with your vision, such as sudden loss of vision in one eye, or seeing double. Other symptoms may include: Fever. Tiredness (fatigue). A dry cough. Pain in the hips and shoulders. Pain in the arms during exercise. Depression. Weight loss. How is this diagnosed? This condition may be diagnosed based on: Your symptoms. Your medical history. A physical exam. Tests, including: Blood tests. A test in which a tissue sample is removed from an artery so it can be examined (biopsy). Imaging tests, such as an ultrasound or MRI. How is this treated? This condition may be treated with: A type of medicine to reduce inflammation  (corticosteroid). Medicines to weaken your immune system (immunosuppressants). Other medicines to treat vision problems. You will need to see your health care provider while you are being treated. The medicines used to treat this condition can increase your risk of problems such as bone loss and diabetes. During follow-up visits, your health care provider will check for problems by: Doing blood tests and bone density tests. Checking your blood pressure and blood sugar. Follow these instructions at home: Medicines Take over-the-counter and prescription medicines only as told by your health care provider. Take any vitamins or supplements recommended by your health care provider. These may include vitamin D and calcium, which help keep your bones from becoming weak. Eating and drinking  Eat a heart-healthy diet. This may include: Eating high-fiber foods, such as fresh fruits and vegetables, whole grains, and beans. Eating heart-healthy fats (omega-3 fats), such as fish, flaxseed, and flaxseed oil. Limiting foods that are high in saturated fat and cholesterol, such as processed and fried foods, fatty meat, and full-fat dairy. Limiting how much salt (sodium) you eat. Include calcium and vitamin D in your diet. Good sources of calcium and vitamin D include: Low-fat dairy products such as milk, yogurt, and cheese. Certain fish, such as fresh or canned salmon, tuna, and sardines. Products that have calcium and vitamin D added to them (fortified products), such as fortified cereals or juice.  General instructions Exercise. Talk with your health care provider about what exercises are okay for you. Exercises that increase your heart rate (aerobic exercise), such as walking, are often recommended. Aerobic exercise helps control your blood pressure and prevent bone loss. Stay up to date on all  vaccines as directed by your health care provider. Keep all follow-up visits as told by your health care  provider. This is important. Contact a health care provider if: Your symptoms get worse. You develop signs of infection, such as fever, swelling, redness, warmth, and tenderness. Get help right away if: You lose your vision. Your pain does not go away, even after you take medicine. You have chest pain. You have trouble breathing. One side of your face or body suddenly becomes weak or numb. These symptoms may represent a serious problem that is an emergency. Do not wait to see if the symptoms will go away. Get medical help right away. Call your local emergency services (911 in the [place] Do not drive yourself to the hospital. Summary Temporal arteritis is a condition that causes arteries to become inflamed. It usually affects arteries in your head and face. This condition can cause serious problems, such as blindness. Treatment can help prevent these problems. Symptoms may include hard or tender temples, pain in your jaw when chewing, problems with your vision, or pain in your hips and shoulders. Take over-the-counter and prescription medicines as told by your health care provider. This information is not intended to replace advice given to you by your health care provider. Make sure you discuss any questions you have with your healthcare provider. Document Revised: 05/21/2017 Document Reviewed: 05/19/2017 Elsevier Patient Education  2022 [employer].

## 2020-11-02 NOTE — Assessment & Plan Note (Signed)
Improving at this time with Tylenol Arthritis and Gabapentin, continue collaboration with vascular.  He is scheduled for temporal artery biopsy 11/14/20 at this time, was on differentials for current headache. Recent note reviewed and appreciate there input.  Return in September.

## 2020-11-02 NOTE — Assessment & Plan Note (Signed)
Continue collaboration with vascular, recent visit with them, will review note and imaging once completed.  Appreciate their input.

## 2020-11-02 NOTE — Progress Notes (Signed)
BP 136/80   Pulse 76   Temp 98.8 F (37.1 C) (Oral)   Wt 178 lb (80.7 kg)   SpO2 96%   BMI 26.29 kg/m    Subjective:    Patient ID: Philip Wood, male    DOB: Jan 14, 1946, 75 y.o.   MRN: [id number]  HPI: Philip Wood is a 75 y.o. male  Chief Complaint  Patient presents with   Headache    Patient states he is feeling a lot better due to changing to the Tylenol Arthritis. Patient states he is still experiencing the headache just not as bad as before.    HEADACHES Follow-up for headache today.  Saw vascular on 11/01/20 -- he is scheduled for temporal artery biopsy and he reports there was some blockage to left carotid.  Has not had headache in 20 years since he stopped drinking.  At this time he feels the headache is improving with two Tylenol Arthritis in morning and Gabapentin 300 MG on board.  He reports no longer waking up with headache.    Denies slurred speech, weakness, vision changes, amaurosis fugax, jaw claudication, or facial droop.  Recent mild elevation CRP noted 10/15/20 at 17, previous also mildly elevated.  His B12 level was also noted to be low recent labs, 315, and he is taking supplement.  Recent MRI on 10/16/20 -- noted no acute changes -- no CVA or bleed.  Some mild chronic small-vessel changes within cerebral white matter and generalized parenchymal atrophy.   Duration: days Onset: sudden Severity: 0/10 today Quality: dull, aching, and throbbing Frequency: occasional Headache duration: short periods, 30 minutes Radiation: no Time of day headache occurs: varies Alleviating factors: Tylenol and Toradol Aggravating factors: coughing  Headache status at time of visit: no headache Treatments attempted: Treatments attempted: rest and APAP   Aura: no Nausea:  no Vomiting: no Photophobia:  no Phonophobia:  no Effect on social functioning:  no Numbers of missed days of school/work each month: none Confusion:  no Gait disturbance/ataxia:  no Behavioral  changes:  no Fevers:  no   Relevant past medical, surgical, family and social history reviewed and updated as indicated. Interim medical history since our last visit reviewed. Allergies and medications reviewed and updated.  Review of Systems  Constitutional:  Negative for activity change, diaphoresis, fatigue and fever.  Respiratory:  Negative for cough, chest tightness, shortness of breath and wheezing.   Cardiovascular:  Negative for chest pain, palpitations and leg swelling.  Gastrointestinal: Negative.   Endocrine: Negative for cold intolerance, heat intolerance, polydipsia, polyphagia and polyuria.  Neurological:  Positive for headaches. Negative for dizziness, syncope, facial asymmetry, speech difficulty, weakness, light-headedness and numbness.  Psychiatric/Behavioral: Negative.     Per HPI unless specifically indicated above     Objective:    BP 136/80   Pulse 76   Temp 98.8 F (37.1 C) (Oral)   Wt 178 lb (80.7 kg)   SpO2 96%   BMI 26.29 kg/m   Wt Readings from Last 3 Encounters:  11/02/20 178 lb (80.7 kg)  11/01/20 178 lb 12.8 oz (81.1 kg)  10/24/20 181 lb 6.4 oz (82.3 kg)    Physical Exam Vitals and nursing note reviewed.  Constitutional:      General: He is awake. He is not in acute distress.    Appearance: He is well-developed and well-groomed. He is not ill-appearing or toxic-appearing.  HENT:     Head: Normocephalic and atraumatic.     Right Ear: Hearing, tympanic  membrane, ear canal and external ear normal. No drainage.     Left Ear: Hearing, tympanic membrane, ear canal and external ear normal. No drainage.     Nose:     Right Sinus: No maxillary sinus tenderness or frontal sinus tenderness.     Left Sinus: No maxillary sinus tenderness or frontal sinus tenderness.  Eyes:     General: Lids are normal.        Right eye: No discharge.        Left eye: No discharge.     Extraocular Movements: Extraocular movements intact.     Conjunctiva/sclera:  Conjunctivae normal.     Pupils: Pupils are equal, round, and reactive to light.     Visual Fields: Right eye visual fields normal and left eye visual fields normal.  Neck:     Thyroid: No thyromegaly.     Vascular: Carotid bruit (left side noted) present.  Cardiovascular:     Rate and Rhythm: Normal rate and regular rhythm.     Heart sounds: Normal heart sounds, S1 normal and S2 normal. No murmur heard.   No gallop.  Pulmonary:     Effort: Pulmonary effort is normal. No accessory muscle usage or respiratory distress.     Breath sounds: Decreased breath sounds and wheezing present.     Comments: Decreased sounds throughout with expiratory wheezes per baseline. Abdominal:     General: Bowel sounds are normal.     Palpations: Abdomen is soft.  Musculoskeletal:        General: Normal range of motion.     Cervical back: Normal range of motion and neck supple.     Right lower leg: No edema.     Left lower leg: No edema.  Lymphadenopathy:     Cervical: No cervical adenopathy.  Skin:    General: Skin is warm and dry.  Neurological:     Mental Status: He is alert and oriented to person, place, and time.     Cranial Nerves: Cranial nerves are intact.     Motor: Motor function is intact.     Coordination: Coordination is intact.     Gait: Gait is intact.     Deep Tendon Reflexes: Reflexes are normal and symmetric.     Reflex Scores:      Brachioradialis reflexes are 2+ on the right side and 2+ on the left side.      Patellar reflexes are 2+ on the right side and 2+ on the left side. Psychiatric:        Attention and Perception: Attention normal.        Mood and Affect: Mood normal.        Speech: Speech normal.        Behavior: Behavior normal. Behavior is cooperative.        Thought Content: Thought content normal.   Results for orders placed or performed in visit on 10/24/20  HM DIABETES EYE EXAM  Result Value Ref Range   HM Diabetic Eye Exam No Retinopathy No Retinopathy       Assessment & Plan:   Problem List Items Addressed This Visit       Other   B12 deficiency    Noted on February labs at 235, recommend continue supplement daily and recheck next visit.       Bruit of left carotid artery    Continue collaboration with vascular, recent visit with them, will review note and imaging once completed.  Appreciate their input.  New onset headache - Primary    Improving at this time with Tylenol Arthritis and Gabapentin, continue collaboration with vascular.  He is scheduled for temporal artery biopsy 11/14/20 at this time, was on differentials for current headache. Recent note reviewed and appreciate there input.  Return in September.         Follow up plan: Return in about 2 months (around 01/15/2021) for T2DM, HTN/HLD, COPD, CANCER -- HEADACHE.

## 2020-11-02 NOTE — Assessment & Plan Note (Signed)
Noted on February labs at 235, recommend continue supplement daily and recheck next visit.

## 2020-11-10 NOTE — Progress Notes (Signed)
Subjective:    Patient ID: Philip Wood, male    DOB: 01/20/1946, 75 y.o.   MRN: [id number] Chief Complaint  Patient presents with  . New Patient (Initial Visit)    Ref Cannady bruit of left carotid artery    Philip Wood is a 75 year old man that presents today on referral after his primary care provider heard a carotid bruit.  In addition, there was a letter sent by his ophthalmologist with concern for possible temporal artery biopsy.  Patient denies any TIA or CVA-like symptoms.  He currently is not on prednisone but notes that he was placed on it for about 2 weeks and it was helpful for his headache.  He denies any jaw pain or changes in his vision.  The pain is right-sided.  It is not tender with palpation.  Today noninvasive studies show 1 to 39% stenosis in the bilateral internal carotid arteries however the left common carotid has a hemodynamically significant plaque greater than 50%.  The left mid common carotid artery is narrow velocities suggesting greater than 50%.  Review of Systems     Objective:   Physical Exam  BP (!) 158/79 (BP Location: Left Arm)   Pulse 76   Resp 16   Ht 5\' 9"  (1.753 m)   Wt 178 lb 12.8 oz (81.1 kg)   BMI 26.40 kg/m   Past Medical History:  Diagnosis Date  . 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 COPD (chronic obstructive pulmonary disease) ([place])   . Diabetes mellitus without complication ([place])   . GERD (gastroesophageal reflux disease)   . Hyperlipidemia   . Hypertension   . Lumbago   . Neuropathy   . Tobacco abuse disorder     Social History   Socioeconomic History  . Marital status: Married    Spouse name: Not on file  . Number of children: Not on file  . Years of education: Not on file  . Highest education level: 10th grade  Occupational History  . Not on file  Tobacco Use  . Smoking status: Light Smoker    Packs/day: 0.60    Years: 53.00    Pack years: 31.80    Types: Cigarettes  . Smokeless tobacco: Never  . Tobacco  comments:    Aprrox 10 cigs/day  Vaping Use  . Vaping Use: Never used  Substance and Sexual Activity  . Alcohol use: No    Alcohol/week: 0.0 standard drinks  . Drug use: No  . Sexual activity: Not Currently  Other Topics Concern  . Not on file  Social History Narrative   Works part time at [employer]: [place]   . Difficulty of Paying Living Expenses: Not hard at all  Food Insecurity: No Food Insecurity  . Worried About Charity fundraiser in the Last Year: Never true  . Ran Out of Food in the Last Year: Never true  Transportation Needs: No Transportation Needs  . Lack of Transportation (Medical): No  . Lack of Transportation (Non-Medical): No  Physical Activity: Inactive  . Days of Exercise per Week: 0 days  . Minutes of Exercise per Session: 0 min  Stress: No Stress Concern Present  . Feeling of Stress : Not at all  Social Connections: Not on file  Intimate Partner Violence: Not on file    Past Surgical History:  Procedure Laterality Date  . CHOLECYSTECTOMY    . ELECTROMAGNETIC NAVIGATION BROCHOSCOPY N/A  08/19/2017   Procedure: ELECTROMAGNETIC NAVIGATION BRONCHOSCOPY;  Surgeon: Flora Lipps, MD;  Location: [facility] ORS;  Service: Cardiopulmonary;  Laterality: N/A;  . ELECTROMAGNETIC NAVIGATION BROCHOSCOPY Left 09/02/2017   Procedure: ELECTROMAGNETIC NAVIGATION BRONCHOSCOPY;  Surgeon: Flora Lipps, MD;  Location: [facility] ORS;  Service: Cardiopulmonary;  Laterality: Left;  . THROAT SURGERY      Family History  Problem Relation Age of Onset  . Heart disease Father   . Heart attack Father   . Brain cancer Sister   . Cervical cancer Sister   . Rectal cancer Brother   . Lung cancer Brother   . Lung cancer Brother   . Lung cancer Brother   . Stomach cancer Sister   . Lung cancer Brother   . Stomach cancer Sister   . Diabetes Sister   . Lupus Sister     No Known Allergies  CBC Latest Ref Rng & Units 10/15/2020 09/20/2020  07/12/2019  WBC 3.4 - 10.8 x10E3/uL 6.7 6.9 5.8  Hemoglobin 13.0 - 17.7 g/dL 14.9 14.8 14.2  Hematocrit 37.5 - 51.0 % 43.8 42.7 42.2  Platelets 150 - 450 x10E3/uL 168 157 143(L)      CMP     Component Value Date/Time   NA 139 10/15/2020 1535   K 4.4 10/15/2020 1535   CL 99 10/15/2020 1535   CO2 24 10/15/2020 1535   GLUCOSE 101 (H) 10/15/2020 1535   GLUCOSE 137 (H) 07/12/2019 0937   BUN 15 10/15/2020 1535   CREATININE 1.01 10/15/2020 1535   CALCIUM 8.9 10/15/2020 1535   PROT 5.9 (L) 10/15/2020 1535   ALBUMIN 3.9 10/15/2020 1535   AST 11 10/15/2020 1535   ALT 11 10/15/2020 1535   ALKPHOS 108 10/15/2020 1535   BILITOT 0.2 10/15/2020 1535   GFRNONAA 62 05/31/2020 0904   GFRAA 72 05/31/2020 0904     No results found.     Assessment & Plan:   1. Bilateral carotid artery stenosis The patient remains asymptomatic with respect to the carotid stenosis.  However, the patient has now progressed and has a lesion the is >70%.  Patient should undergo CT angiography of the carotid arteries to define the degree of stenosis of the internal carotid arteries bilaterally and the anatomic suitability for surgery vs. intervention.  If the patient does indeed need surgery cardiac clearance will be required, once cleared the patient will be scheduled for surgery.  The risks, benefits and alternative therapies were reviewed in detail with the patient.  All questions were answered.  The patient agrees to proceed with imaging.  Continue antiplatelet therapy as prescribed. Continue management of CAD, HTN and Hyperlipidemia. Healthy heart diet, encouraged exercise at least 4 times per week.   - CT ANGIO NECK W OR WO CONTRAST; Future - Creatinine, IStat  2. New onset headache The patient's symptoms are concerning for possible temporal arteritis.  Typically the only true diagnosis is temporal artery biopsy.  The patient did have a short course of steroids it was helpful and it was noticed that  the headache returned after the steroids.  We discussed the risk, benefits and alternatives to a temporal artery biopsy.  We also discussed that ongoing treatment will be provided by his PCP or ophthalmologist depending on the results.  3. Hyperlipidemia associated with type 2 diabetes mellitus ([place]) Continue statin as ordered and reviewed, no changes at this time    Current Outpatient Medications on File Prior to Visit  Medication Sig Dispense Refill  . allopurinol (ZYLOPRIM) 100 MG  tablet Take 1 tablet (100 mg total) by mouth daily. (Patient taking differently: Take 100 mg by mouth in the morning.) 90 tablet 4  . atorvastatin (LIPITOR) 80 MG tablet Take 1 tablet (80 mg total) by mouth daily at 6 PM. 90 tablet 4  . gabapentin (NEURONTIN) 600 MG tablet Take 0.5 tablets (300 mg total) by mouth at bedtime. 30 tablet 2  . metFORMIN (GLUCOPHAGE) 500 MG tablet TAKE 1 TABLET BY MOUTH TWICE DAILY WITH A MEAL (Patient taking differently: Take 500 mg by mouth 2 (two) times daily with a meal.) 180 tablet 0  . metoprolol succinate (TOPROL XL) 25 MG 24 hr tablet Take 1 tablet (25 mg total) by mouth daily. (Patient taking differently: Take 25 mg by mouth in the morning.) 90 tablet 4  . ramipril (ALTACE) 10 MG capsule Take 1 capsule (10 mg total) by mouth daily. (Patient taking differently: Take 10 mg by mouth in the morning.) 90 capsule 4  . tiotropium (SPIRIVA HANDIHALER) 18 MCG inhalation capsule PLACE 1 CAPSULE INTO INHALER AND INHALE ITS CONTENTS ONCE DAILY. (Patient taking differently: Place 18 mcg into inhaler and inhale in the morning.) 90 capsule 4  . tiZANidine (ZANAFLEX) 4 MG tablet Take 1 tablet (4 mg total) by mouth every 6 (six) hours as needed for muscle spasms. 30 tablet 0  . vitamin B-12 (CYANOCOBALAMIN) 1000 MCG tablet Take 1,000 mcg by mouth daily at 12 noon.     No current facility-administered medications on file prior to visit.    There are no Patient Instructions on file for this  visit. No follow-ups on file.   Kris Hartmann, NP

## 2020-11-10 NOTE — H&P (View-Only) (Signed)
Subjective:    Patient ID: Philip Wood, male    DOB: 18-Aug-1945, 75 y.o.   MRN: [id number] Chief Complaint  Patient presents with  . New Patient (Initial Visit)    Ref Cannady bruit of left carotid artery    Philip Wood is a 75 year old man that presents today on referral after his primary care provider heard a carotid bruit.  In addition, there was a letter sent by his ophthalmologist with concern for possible temporal artery biopsy.  Patient denies any TIA or CVA-like symptoms.  He currently is not on prednisone but notes that he was placed on it for about 2 weeks and it was helpful for his headache.  He denies any jaw pain or changes in his vision.  The pain is right-sided.  It is not tender with palpation.  Today noninvasive studies show 1 to 39% stenosis in the bilateral internal carotid arteries however the left common carotid has a hemodynamically significant plaque greater than 50%.  The left mid common carotid artery is narrow velocities suggesting greater than 50%.  Review of Systems     Objective:   Physical Exam  BP (!) 158/79 (BP Location: Left Arm)   Pulse 76   Resp 16   Ht 5\' 9"  (1.753 m)   Wt 178 lb 12.8 oz (81.1 kg)   BMI 26.40 kg/m   Past Medical History:  Diagnosis Date  . 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 COPD (chronic obstructive pulmonary disease) ([place])   . Diabetes mellitus without complication ([place])   . GERD (gastroesophageal reflux disease)   . Hyperlipidemia   . Hypertension   . Lumbago   . Neuropathy   . Tobacco abuse disorder     Social History   Socioeconomic History  . Marital status: Married    Spouse name: Not on file  . Number of children: Not on file  . Years of education: Not on file  . Highest education level: 10th grade  Occupational History  . Not on file  Tobacco Use  . Smoking status: Light Smoker    Packs/day: 0.60    Years: 53.00    Pack years: 31.80    Types: Cigarettes  . Smokeless tobacco: Never  . Tobacco  comments:    Aprrox 10 cigs/day  Vaping Use  . Vaping Use: Never used  Substance and Sexual Activity  . Alcohol use: No    Alcohol/week: 0.0 standard drinks  . Drug use: No  . Sexual activity: Not Currently  Other Topics Concern  . Not on file  Social History Narrative   Works part time at [employer]: [place]   . Difficulty of Paying Living Expenses: Not hard at all  Food Insecurity: No Food Insecurity  . Worried About Charity fundraiser in the Last Year: Never true  . Ran Out of Food in the Last Year: Never true  Transportation Needs: No Transportation Needs  . Lack of Transportation (Medical): No  . Lack of Transportation (Non-Medical): No  Physical Activity: Inactive  . Days of Exercise per Week: 0 days  . Minutes of Exercise per Session: 0 min  Stress: No Stress Concern Present  . Feeling of Stress : Not at all  Social Connections: Not on file  Intimate Partner Violence: Not on file    Past Surgical History:  Procedure Laterality Date  . CHOLECYSTECTOMY    . ELECTROMAGNETIC NAVIGATION BROCHOSCOPY N/A  08/19/2017   Procedure: ELECTROMAGNETIC NAVIGATION BRONCHOSCOPY;  Surgeon: Flora Lipps, MD;  Location: [facility] ORS;  Service: Cardiopulmonary;  Laterality: N/A;  . ELECTROMAGNETIC NAVIGATION BROCHOSCOPY Left 09/02/2017   Procedure: ELECTROMAGNETIC NAVIGATION BRONCHOSCOPY;  Surgeon: Flora Lipps, MD;  Location: [facility] ORS;  Service: Cardiopulmonary;  Laterality: Left;  . THROAT SURGERY      Family History  Problem Relation Age of Onset  . Heart disease Father   . Heart attack Father   . Brain cancer Sister   . Cervical cancer Sister   . Rectal cancer Brother   . Lung cancer Brother   . Lung cancer Brother   . Lung cancer Brother   . Stomach cancer Sister   . Lung cancer Brother   . Stomach cancer Sister   . Diabetes Sister   . Lupus Sister     No Known Allergies  CBC Latest Ref Rng & Units 10/15/2020 09/20/2020  07/12/2019  WBC 3.4 - 10.8 x10E3/uL 6.7 6.9 5.8  Hemoglobin 13.0 - 17.7 g/dL 14.9 14.8 14.2  Hematocrit 37.5 - 51.0 % 43.8 42.7 42.2  Platelets 150 - 450 x10E3/uL 168 157 143(L)      CMP     Component Value Date/Time   NA 139 10/15/2020 1535   K 4.4 10/15/2020 1535   CL 99 10/15/2020 1535   CO2 24 10/15/2020 1535   GLUCOSE 101 (H) 10/15/2020 1535   GLUCOSE 137 (H) 07/12/2019 0937   BUN 15 10/15/2020 1535   CREATININE 1.01 10/15/2020 1535   CALCIUM 8.9 10/15/2020 1535   PROT 5.9 (L) 10/15/2020 1535   ALBUMIN 3.9 10/15/2020 1535   AST 11 10/15/2020 1535   ALT 11 10/15/2020 1535   ALKPHOS 108 10/15/2020 1535   BILITOT 0.2 10/15/2020 1535   GFRNONAA 62 05/31/2020 0904   GFRAA 72 05/31/2020 0904     No results found.     Assessment & Plan:   1. Bilateral carotid artery stenosis The patient remains asymptomatic with respect to the carotid stenosis.  However, the patient has now progressed and has a lesion the is >70%.  Patient should undergo CT angiography of the carotid arteries to define the degree of stenosis of the internal carotid arteries bilaterally and the anatomic suitability for surgery vs. intervention.  If the patient does indeed need surgery cardiac clearance will be required, once cleared the patient will be scheduled for surgery.  The risks, benefits and alternative therapies were reviewed in detail with the patient.  All questions were answered.  The patient agrees to proceed with imaging.  Continue antiplatelet therapy as prescribed. Continue management of CAD, HTN and Hyperlipidemia. Healthy heart diet, encouraged exercise at least 4 times per week.   - CT ANGIO NECK W OR WO CONTRAST; Future - Creatinine, IStat  2. New onset headache The patient's symptoms are concerning for possible temporal arteritis.  Typically the only true diagnosis is temporal artery biopsy.  The patient did have a short course of steroids it was helpful and it was noticed that  the headache returned after the steroids.  We discussed the risk, benefits and alternatives to a temporal artery biopsy.  We also discussed that ongoing treatment will be provided by his PCP or ophthalmologist depending on the results.  3. Hyperlipidemia associated with type 2 diabetes mellitus ([place]) Continue statin as ordered and reviewed, no changes at this time    Current Outpatient Medications on File Prior to Visit  Medication Sig Dispense Refill  . allopurinol (ZYLOPRIM) 100 MG  tablet Take 1 tablet (100 mg total) by mouth daily. (Patient taking differently: Take 100 mg by mouth in the morning.) 90 tablet 4  . atorvastatin (LIPITOR) 80 MG tablet Take 1 tablet (80 mg total) by mouth daily at 6 PM. 90 tablet 4  . gabapentin (NEURONTIN) 600 MG tablet Take 0.5 tablets (300 mg total) by mouth at bedtime. 30 tablet 2  . metFORMIN (GLUCOPHAGE) 500 MG tablet TAKE 1 TABLET BY MOUTH TWICE DAILY WITH A MEAL (Patient taking differently: Take 500 mg by mouth 2 (two) times daily with a meal.) 180 tablet 0  . metoprolol succinate (TOPROL XL) 25 MG 24 hr tablet Take 1 tablet (25 mg total) by mouth daily. (Patient taking differently: Take 25 mg by mouth in the morning.) 90 tablet 4  . ramipril (ALTACE) 10 MG capsule Take 1 capsule (10 mg total) by mouth daily. (Patient taking differently: Take 10 mg by mouth in the morning.) 90 capsule 4  . tiotropium (SPIRIVA HANDIHALER) 18 MCG inhalation capsule PLACE 1 CAPSULE INTO INHALER AND INHALE ITS CONTENTS ONCE DAILY. (Patient taking differently: Place 18 mcg into inhaler and inhale in the morning.) 90 capsule 4  . tiZANidine (ZANAFLEX) 4 MG tablet Take 1 tablet (4 mg total) by mouth every 6 (six) hours as needed for muscle spasms. 30 tablet 0  . vitamin B-12 (CYANOCOBALAMIN) 1000 MCG tablet Take 1,000 mcg by mouth daily at 12 noon.     No current facility-administered medications on file prior to visit.    There are no Patient Instructions on file for this  visit. No follow-ups on file.   Kris Hartmann, NP

## 2020-11-11 ENCOUNTER — Encounter (INDEPENDENT_AMBULATORY_CARE_PROVIDER_SITE_OTHER): Payer: Self-pay | Admitting: Nurse Practitioner

## 2020-11-12 ENCOUNTER — Telehealth (INDEPENDENT_AMBULATORY_CARE_PROVIDER_SITE_OTHER): Payer: Self-pay

## 2020-11-12 NOTE — Telephone Encounter (Signed)
Spoke with the patient regarding his surgery with Dr. Delana Meyer on 11/14/20 at [facility] for a right temporal artery biopsy. Patient was given the information about pre-op phone call on 11/13/20 between 8-1 pm and calling same day surgery between 1-3 pm to receive his arrival time to the hospital on 11/14/20. Patient stated he was writing this information down and was given the number to contact me directly if he needed to clarify any information.

## 2020-11-13 ENCOUNTER — Other Ambulatory Visit
Admission: RE | Admit: 2020-11-13 | Discharge: 2020-11-13 | Disposition: A | Discharge status: Home / Self Care | Payer: Medicare Other | Source: Ambulatory Visit | Attending: Vascular Surgery | Admitting: Vascular Surgery

## 2020-11-13 ENCOUNTER — Other Ambulatory Visit (INDEPENDENT_AMBULATORY_CARE_PROVIDER_SITE_OTHER): Payer: Self-pay | Admitting: Nurse Practitioner

## 2020-11-13 ENCOUNTER — Other Ambulatory Visit: Payer: Self-pay

## 2020-11-13 DIAGNOSIS — Z01818 Encounter for other preprocedural examination: Secondary | ICD-10-CM | POA: Diagnosis not present

## 2020-11-13 HISTORY — DX: Atherosclerotic heart disease of native coronary artery without angina pectoris: I25.10

## 2020-11-13 HISTORY — DX: Other specified symptoms and signs involving the circulatory and respiratory systems: R09.89

## 2020-11-13 HISTORY — DX: Personal history of urinary calculi: Z87.442

## 2020-11-13 HISTORY — DX: Headache, unspecified: R51.9

## 2020-11-13 HISTORY — DX: Heart failure, unspecified: I50.9

## 2020-11-13 LAB — CBC WITH DIFFERENTIAL/PLATELET
Abs Immature Granulocytes: 0.01 10*3/uL (ref 0.00–0.07)
Basophils Absolute: 0 10*3/uL (ref 0.0–0.1)
Basophils Relative: 0 %
Eosinophils Absolute: 0.5 10*3/uL (ref 0.0–0.5)
Eosinophils Relative: 10 %
HCT: 40.3 % (ref 39.0–52.0)
Hemoglobin: 14.1 g/dL (ref 13.0–17.0)
Immature Granulocytes: 0 %
Lymphocytes Relative: 24 %
Lymphs Abs: 1.3 10*3/uL (ref 0.7–4.0)
MCH: 32.8 pg (ref 26.0–34.0)
MCHC: 35 g/dL (ref 30.0–36.0)
MCV: 93.7 fL (ref 80.0–100.0)
Monocytes Absolute: 0.5 10*3/uL (ref 0.1–1.0)
Monocytes Relative: 10 %
Neutro Abs: 3 10*3/uL (ref 1.7–7.7)
Neutrophils Relative %: 56 %
Platelets: 177 10*3/uL (ref 150–400)
RBC: 4.3 MIL/uL (ref 4.22–5.81)
RDW: 13.3 % (ref 11.5–15.5)
WBC: 5.4 10*3/uL (ref 4.0–10.5)
nRBC: 0 % (ref 0.0–0.2)

## 2020-11-13 LAB — BASIC METABOLIC PANEL
Anion gap: 7 (ref 5–15)
BUN: 20 mg/dL (ref 8–23)
CO2: 26 mmol/L (ref 22–32)
Calcium: 9.1 mg/dL (ref 8.9–10.3)
Chloride: 105 mmol/L (ref 98–111)
Creatinine, Ser: 0.98 mg/dL (ref 0.61–1.24)
GFR, Estimated: >60 mL/min (ref >60)
Glucose, Bld: 110 mg/dL — ABNORMAL HIGH (ref 70–99)
Potassium: 4.1 mmol/L (ref 3.5–5.1)
Sodium: 138 mmol/L (ref 135–145)

## 2020-11-13 LAB — TYPE AND SCREEN
ABO/RH(D): B POS
Antibody Screen: NEGATIVE

## 2020-11-13 NOTE — Patient Instructions (Signed)
Your procedure is scheduled on: Wednesday November 14, 2020. Report to Day Surgery inside [facility] 2nd floor stop by admissions desk first before getting on elevator. To find out your arrival time please call [phone] between 1PM - 3PM on Tuesday November 13, 2020.  Remember: Instructions that are not followed completely may result in serious medical risk,  up to and including death, or upon the discretion of your surgeon and anesthesiologist your  surgery may need to be rescheduled.     _X__ 1. Do not eat food or drink fluids after midnight the night before your procedure.                 No chewing gum or hard candies.   __X__2.  On the morning of surgery brush your teeth with toothpaste and water, you                may rinse your mouth with mouthwash if you wish.  Do not swallow any toothpaste of mouthwash.     _X__ 3.  No Alcohol for 24 hours before or after surgery.   _X__ 4.  Do Not Smoke or use e-cigarettes For 24 Hours Prior to Your Surgery.                 Do not use any chewable tobacco products for at least 6 hours prior to                 Surgery.  _X__  5.  Do not use any recreational drugs (marijuana, cocaine, heroin, ecstasy, MDMA or other)                For at least one week prior to your surgery.  Combination of these drugs with anesthesia                May have life threatening results.  __X__ 6  Notify your doctor if there is any change in your medical condition      (cold, fever, infections).     Do not wear jewelry, make-up, hairpins, clips or nail polish. Do not wear lotions, powders, or perfumes. You may wear deodorant. Do not shave 48 hours prior to surgery. Men may shave face and neck. Do not bring valuables to the hospital.    [facility] is not responsible for any belongings or valuables.  Contacts, dentures or bridgework may not be worn into surgery. Leave your suitcase in the car. After surgery it may be brought to your  room. For patients admitted to the hospital, discharge time is determined by your treatment team.   Patients discharged the day of surgery will not be allowed to drive home.   Make arrangements for someone to be with you for the first 24 hours of your Same Day Discharge.   __X__ Take these medicines the morning of surgery with A SIP OF WATER:    1. allopurinol (ZYLOPRIM) 100 MG  2. metoprolol succinate (TOPROL XL) 25 MG   3.   4.  5.  6.  ____ Fleet Enema (as directed)   __X__ Use CHG Soap (or wipes) as directed  ____ Use Benzoyl Peroxide Gel as instructed  __X__ Use inhalers on the day of surgery  tiotropium (SPIRIVA HANDIHALER) 18 MCG inhalation capsule ____ Stop metformin 2 days prior to surgery    ____ Take 1/2 of usual insulin dose the night before surgery. No insulin the morning  of surgery.   ____ Call your PCP, cardiologist, or Pulmonologist if taking Coumadin/Plavix/aspirin and ask when to stop before your surgery.   __X__ One Week prior to surgery- Stop Anti-inflammatories such as Ibuprofen, Aleve, Advil, Motrin, meloxicam (MOBIC), diclofenac, etodolac, ketorolac, Toradol, Daypro, piroxicam, Goody's or BC powders. OK TO USE TYLENOL IF NEEDED   __X__ Stop supplements until after surgery.    ____ Bring C-Pap to the hospital.    If you have any questions regarding your pre-procedure instructions,  Please call Pre-admit Testing at [phone].

## 2020-11-14 ENCOUNTER — Encounter: Payer: Self-pay | Admitting: Vascular Surgery

## 2020-11-14 ENCOUNTER — Encounter
Admission: RE | Disposition: A | Discharge status: Home / Self Care | Payer: Self-pay | Source: Home / Self Care | Attending: Vascular Surgery

## 2020-11-14 ENCOUNTER — Ambulatory Visit: Payer: Medicare Other | Admitting: Anesthesiology

## 2020-11-14 ENCOUNTER — Ambulatory Visit: Payer: Medicare Other

## 2020-11-14 ENCOUNTER — Ambulatory Visit
Admission: RE | Admit: 2020-11-14 | Discharge: 2020-11-14 | Disposition: A | Discharge status: Home / Self Care | Payer: Medicare Other | Attending: Vascular Surgery | Admitting: Vascular Surgery

## 2020-11-14 DIAGNOSIS — Z8249 Family history of ischemic heart disease and other diseases of the circulatory system: Secondary | ICD-10-CM | POA: Insufficient documentation

## 2020-11-14 DIAGNOSIS — E1140 Type 2 diabetes mellitus with diabetic neuropathy, unspecified: Secondary | ICD-10-CM | POA: Insufficient documentation

## 2020-11-14 DIAGNOSIS — I10 Essential (primary) hypertension: Secondary | ICD-10-CM | POA: Insufficient documentation

## 2020-11-14 DIAGNOSIS — E1169 Type 2 diabetes mellitus with other specified complication: Secondary | ICD-10-CM | POA: Diagnosis not present

## 2020-11-14 DIAGNOSIS — Z809 Family history of malignant neoplasm, unspecified: Secondary | ICD-10-CM | POA: Insufficient documentation

## 2020-11-14 DIAGNOSIS — Z833 Family history of diabetes mellitus: Secondary | ICD-10-CM | POA: Diagnosis not present

## 2020-11-14 DIAGNOSIS — R519 Headache, unspecified: Secondary | ICD-10-CM | POA: Diagnosis not present

## 2020-11-14 DIAGNOSIS — J449 Chronic obstructive pulmonary disease, unspecified: Secondary | ICD-10-CM | POA: Insufficient documentation

## 2020-11-14 DIAGNOSIS — Z9049 Acquired absence of other specified parts of digestive tract: Secondary | ICD-10-CM | POA: Diagnosis not present

## 2020-11-14 DIAGNOSIS — Z8049 Family history of malignant neoplasm of other genital organs: Secondary | ICD-10-CM | POA: Insufficient documentation

## 2020-11-14 DIAGNOSIS — I6523 Occlusion and stenosis of bilateral carotid arteries: Secondary | ICD-10-CM | POA: Insufficient documentation

## 2020-11-14 DIAGNOSIS — Z7984 Long term (current) use of oral hypoglycemic drugs: Secondary | ICD-10-CM | POA: Insufficient documentation

## 2020-11-14 DIAGNOSIS — Z79899 Other long term (current) drug therapy: Secondary | ICD-10-CM | POA: Insufficient documentation

## 2020-11-14 DIAGNOSIS — K219 Gastro-esophageal reflux disease without esophagitis: Secondary | ICD-10-CM | POA: Diagnosis not present

## 2020-11-14 DIAGNOSIS — Z801 Family history of malignant neoplasm of trachea, bronchus and lung: Secondary | ICD-10-CM | POA: Diagnosis not present

## 2020-11-14 DIAGNOSIS — Z800 Family history of malignant neoplasm of digestive organs: Secondary | ICD-10-CM | POA: Insufficient documentation

## 2020-11-14 DIAGNOSIS — F17210 Nicotine dependence, cigarettes, uncomplicated: Secondary | ICD-10-CM | POA: Insufficient documentation

## 2020-11-14 DIAGNOSIS — E785 Hyperlipidemia, unspecified: Secondary | ICD-10-CM | POA: Insufficient documentation

## 2020-11-14 DIAGNOSIS — Z0000 Encounter for general adult medical examination without abnormal findings: Secondary | ICD-10-CM | POA: Diagnosis not present

## 2020-11-14 DIAGNOSIS — M316 Other giant cell arteritis: Secondary | ICD-10-CM | POA: Diagnosis not present

## 2020-11-14 DIAGNOSIS — M4802 Spinal stenosis, cervical region: Secondary | ICD-10-CM | POA: Diagnosis not present

## 2020-11-14 DIAGNOSIS — I771 Stricture of artery: Secondary | ICD-10-CM | POA: Diagnosis not present

## 2020-11-14 DIAGNOSIS — Z8521 Personal history of malignant neoplasm of larynx: Secondary | ICD-10-CM | POA: Insufficient documentation

## 2020-11-14 DIAGNOSIS — I6503 Occlusion and stenosis of bilateral vertebral arteries: Secondary | ICD-10-CM | POA: Diagnosis not present

## 2020-11-14 HISTORY — PX: ARTERY BIOPSY: SHX891

## 2020-11-14 LAB — POCT I-STAT, CHEM 8
BUN: 18 mg/dL (ref 8–23)
Calcium, Ion: 1.17 mmol/L (ref 1.15–1.40)
Chloride: 102 mmol/L (ref 98–111)
Creatinine, Ser: 1.1 mg/dL (ref 0.61–1.24)
Glucose, Bld: 115 mg/dL — ABNORMAL HIGH (ref 70–99)
HCT: 43 % (ref 39.0–52.0)
Hemoglobin: 14.6 g/dL (ref 13.0–17.0)
Potassium: 4 mmol/L (ref 3.5–5.1)
Sodium: 141 mmol/L (ref 135–145)
TCO2: 28 mmol/L (ref 22–32)

## 2020-11-14 LAB — GLUCOSE, CAPILLARY
Glucose-Capillary: 111 mg/dL — ABNORMAL HIGH (ref 70–99)
Glucose-Capillary: 123 mg/dL — ABNORMAL HIGH (ref 70–99)

## 2020-11-14 LAB — ABO/RH: ABO/RH(D): B POS

## 2020-11-14 IMAGING — CT CT ANGIO NECK
2 of 7 series · 8 of 33 positions shown · IV contrast (APPLIED)
Comparison: CT chest [DATE].

CLINICAL DATA: Carotid stenosis screening, no risk factors.

EXAM:
CT ANGIOGRAPHY NECK
TECHNIQUE: Multidetector CT imaging of the neck was performed using the
standard protocol during bolus administration of intravenous
contrast. Multiplanar CT image reconstructions and MIPs were
obtained to evaluate the vascular anatomy. Carotid stenosis
measurements (when applicable) are obtained utilizing NASCET
criteria, using the distal internal carotid diameter as the
denominator.
CONTRAST:  75mL OMNIPAQUE IOHEXOL 350 MG/ML SOLN

[Series 4: cta neck · axial · 0.56mm/px · z∈[-234,-150]mm · 2 of 127 slices shown]
[im 43/127  soft-tissue]
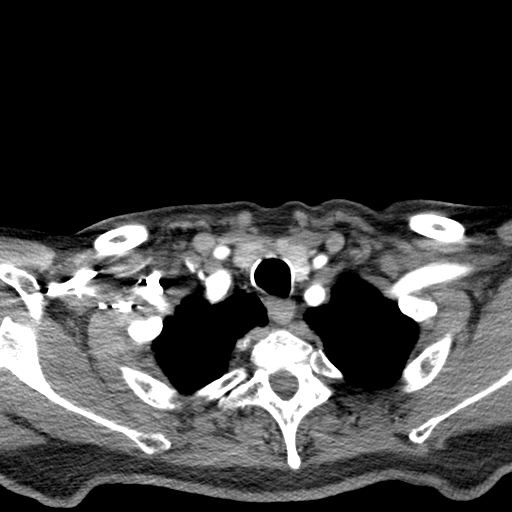
[im 85/127  soft-tissue]
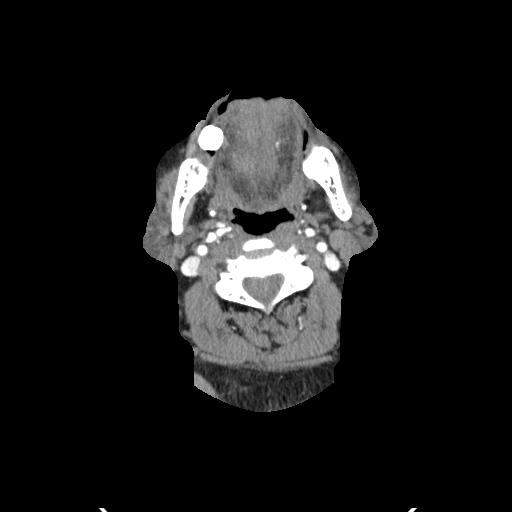

[Series 6: ax thin · axial · 0.39mm/px · z∈[-319,-138]mm · 6 of 261 slices shown]
[im 38/261  soft-tissue]
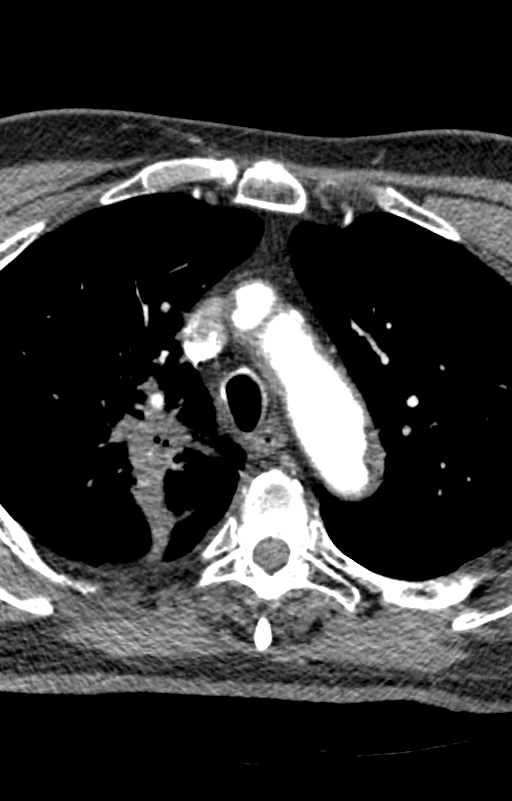
[im 75/261  bone]
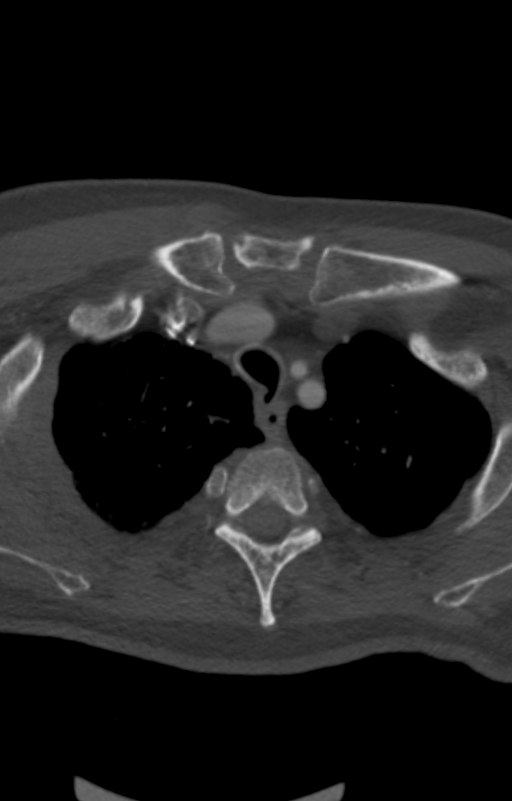
[im 112/261  soft-tissue]
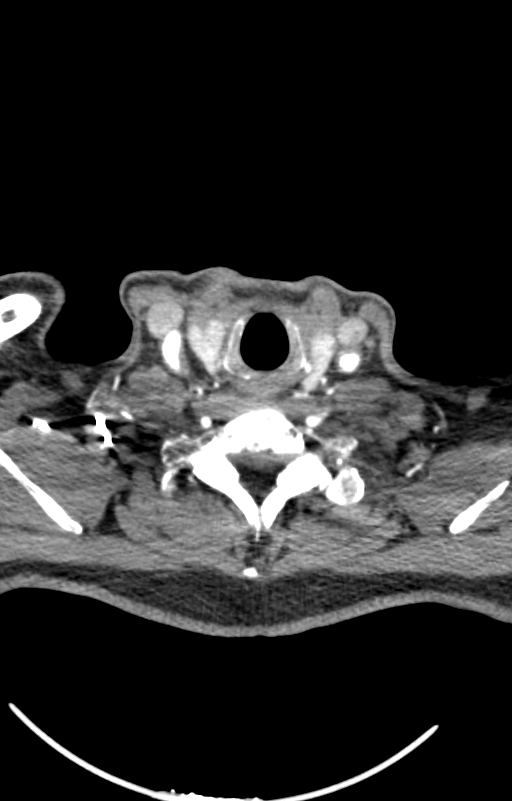
[im 149/261  bone]
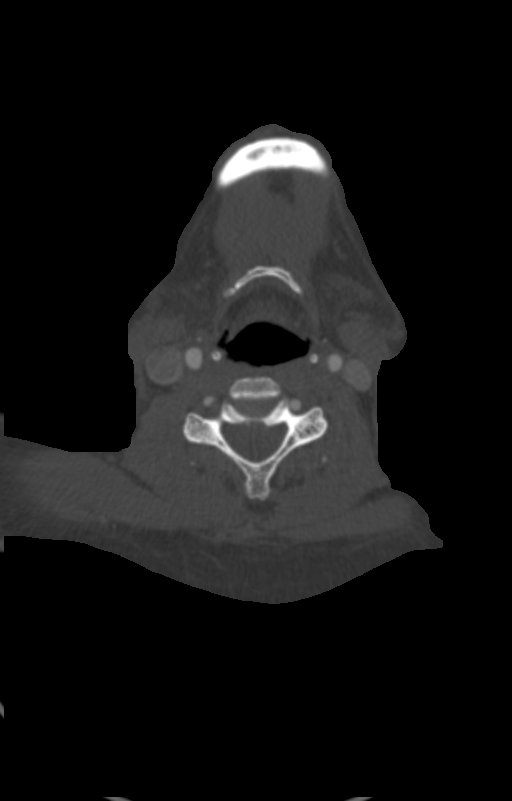
[im 186/261  soft-tissue]
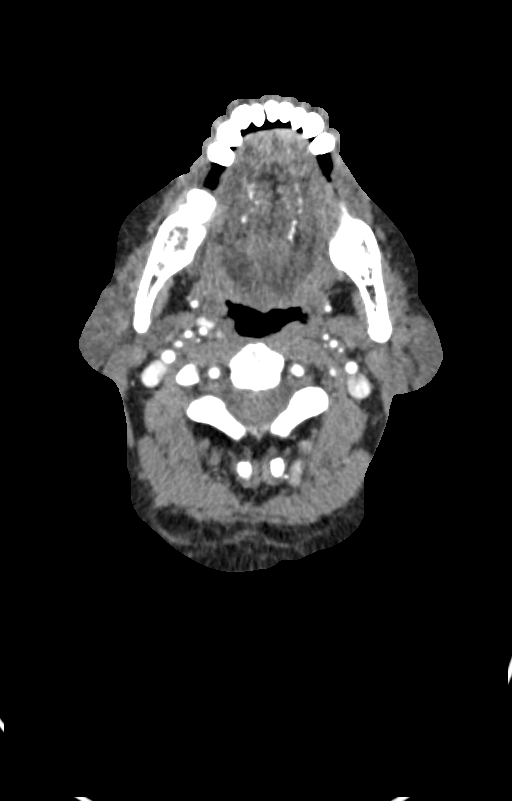
[im 223/261  bone]
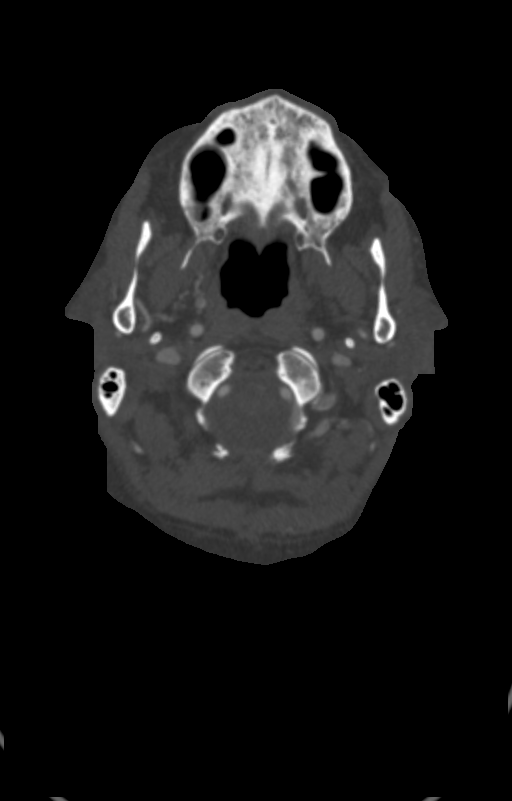

[8 of 33 positions shown; findings below may reference images not displayed]

FINDINGS: Aortic arch: Atherosclerosis. Great vessel origins are patent.
Approximately 30% stenosis of the left subclavian artery origin due
to atherosclerosis.

Right carotid system: Ulcerated atherosclerosis of the common
carotid artery without greater than 50% narrowing of the common
carotid artery. Calcific and noncalcific atherosclerosis at the
carotid bifurcation with approximately 40% stenosis.

Left carotid system: Predominately non calcific atherosclerosis of
the common carotid artery with areas of ulceration and approximately
70% stenosis of the common carotid artery at the C6-C7 level.
Atherosclerosis at the carotid bifurcation with approximately

Vertebral arteries: Bilateral vertebral arteries are patent.
Moderate narrowing of the proximal right vertebral artery and severe
narrowing of the right vertebral artery at the C6 level, proximally
sign%. Tortuous left vertebral artery proximally.

Skeleton: Mild multilevel degenerative change of the cervical spine.

Other neck: No acute abnormality.

Upper chest: Although partially imaged, suspected interval increase
in size/bulk of soft tissue in the right hilum and right
paratracheal mediastinum with extension into the adjacent lung. For
example, spiculated soft tissue in the right upper lobe measures
approximately 2.4 x 2.7 cm on series 6, image 38, previously 1.9 x
1.7 cm.
IMPRESSION: 1. Bilateral ulcerated common carotid artery atherosclerosis with
approximately 70% stenosis of the left common carotid artery at the
C6-C7 level.
2. Severe (approximately 70%) narrowing of the right vertebral
artery at the C7 level with moderate narrowing proximally.
3. Bilateral carotid bifurcation atherosclerosis with approximately
40% stenosis of the proximal internal carotid arteries bilaterally.
4. Approximately 30% stenosis of the left subclavian artery origin.
5. Although partially imaged, suspected interval increase in
size/bulk of soft tissue in the right hilum and right paratracheal
mediastinum with extension into the adjacent lung. Please see recent
CT chest from [DATE] for further characterization,
differential considerations, and follow-up recommendations. A
dedicated CT chest could provide complete intrathoracic evaluation
and better direct comparison if clinically indicated.

## 2020-11-14 SURGERY — BIOPSY TEMPORAL ARTERY
Anesthesia: General | Laterality: Right

## 2020-11-14 MED ORDER — PROPOFOL 500 MG/50ML IV EMUL
INTRAVENOUS | Status: DC | PRN
  Administered 2020-11-14: 40 ug/kg/min via INTRAVENOUS
  Administered 2020-11-14: 30 mg via INTRAVENOUS
  Administered 2020-11-14: 20 mg via INTRAVENOUS

## 2020-11-14 MED ORDER — MIDAZOLAM HCL 2 MG/2ML IJ SOLN
INTRAMUSCULAR | Status: AC
  Filled 2020-11-14: qty 2

## 2020-11-14 MED ORDER — OXYCODONE HCL 5 MG PO TABS
5.0000 mg | ORAL_TABLET | Freq: Once | ORAL | Status: DC | PRN
Start: 2020-11-14 — End: 2020-11-14

## 2020-11-14 MED ORDER — TRAMADOL HCL 50 MG PO TABS: 50.0000 mg | ORAL_TABLET | Freq: Four times a day (QID) | ORAL | 0 refills | Status: DC | PRN

## 2020-11-14 MED ORDER — FAMOTIDINE 20 MG PO TABS
20.0000 mg | ORAL_TABLET | Freq: Once | ORAL | Status: DC
Start: 2020-11-14 — End: 2020-11-14

## 2020-11-14 MED ORDER — OXYCODONE HCL 5 MG/5ML PO SOLN: 5.0000 mg | Freq: Once | ORAL | Status: DC | PRN

## 2020-11-14 MED ORDER — ORAL CARE MOUTH RINSE: 15.0000 mL | Freq: Once | OROMUCOSAL | Status: AC

## 2020-11-14 MED ORDER — CHLORHEXIDINE GLUCONATE CLOTH 2 % EX PADS
6.0000 | MEDICATED_PAD | Freq: Once | CUTANEOUS | Status: AC
  Administered 2020-11-14: 6 via TOPICAL

## 2020-11-14 MED ORDER — CHLORHEXIDINE GLUCONATE 0.12 % MT SOLN
OROMUCOSAL | Status: AC
  Administered 2020-11-14: 15 mL via OROMUCOSAL
  Filled 2020-11-14: qty 15

## 2020-11-14 MED ORDER — IOHEXOL 350 MG/ML SOLN
75.0000 mL | Freq: Once | INTRAVENOUS | Status: AC | PRN
  Administered 2020-11-14: 75 mL via INTRAVENOUS

## 2020-11-14 MED ORDER — CEFAZOLIN SODIUM-DEXTROSE 2-4 GM/100ML-% IV SOLN
2.0000 g | INTRAVENOUS | Status: AC
  Administered 2020-11-14: 2 g via INTRAVENOUS

## 2020-11-14 MED ORDER — HYDROMORPHONE HCL 1 MG/ML IJ SOLN: 1.0000 mg | Freq: Once | INTRAMUSCULAR | Status: DC | PRN

## 2020-11-14 MED ORDER — BUPIVACAINE HCL (PF) 0.5 % IJ SOLN
INTRAMUSCULAR | Status: AC
  Filled 2020-11-14: qty 30

## 2020-11-14 MED ORDER — CHLORHEXIDINE GLUCONATE 0.12 % MT SOLN: 15.0000 mL | Freq: Once | OROMUCOSAL | Status: AC

## 2020-11-14 MED ORDER — LIDOCAINE HCL (CARDIAC) PF 100 MG/5ML IV SOSY
PREFILLED_SYRINGE | INTRAVENOUS | Status: DC | PRN
  Administered 2020-11-14: 100 mg via INTRAVENOUS

## 2020-11-14 MED ORDER — TRAMADOL HCL 50 MG PO TABS
ORAL_TABLET | ORAL | Status: AC
  Administered 2020-11-14: 50 mg via ORAL
  Filled 2020-11-14: qty 1

## 2020-11-14 MED ORDER — TRAMADOL HCL 50 MG PO TABS
50.0000 mg | ORAL_TABLET | Freq: Once | ORAL | Status: AC
Start: 2020-11-14 — End: 2020-11-14

## 2020-11-14 MED ORDER — PROPOFOL 10 MG/ML IV BOLUS
INTRAVENOUS | Status: AC
  Filled 2020-11-14: qty 20

## 2020-11-14 MED ORDER — ACETAMINOPHEN 10 MG/ML IV SOLN: 1000.0000 mg | Freq: Once | INTRAVENOUS | Status: DC | PRN

## 2020-11-14 MED ORDER — SODIUM CHLORIDE 0.9 % IV SOLN: INTRAVENOUS | Status: DC

## 2020-11-14 MED ORDER — ONDANSETRON HCL 4 MG/2ML IJ SOLN: 4.0000 mg | Freq: Four times a day (QID) | INTRAMUSCULAR | Status: DC | PRN

## 2020-11-14 MED ORDER — MIDAZOLAM HCL 2 MG/2ML IJ SOLN
INTRAMUSCULAR | Status: DC | PRN
  Administered 2020-11-14 (×2): 1 mg via INTRAVENOUS

## 2020-11-14 MED ORDER — CEFAZOLIN SODIUM-DEXTROSE 2-4 GM/100ML-% IV SOLN
INTRAVENOUS | Status: AC
  Filled 2020-11-14: qty 100

## 2020-11-14 MED ORDER — FENTANYL CITRATE (PF) 100 MCG/2ML IJ SOLN
INTRAMUSCULAR | Status: AC
  Filled 2020-11-14: qty 2

## 2020-11-14 MED ORDER — FAMOTIDINE 20 MG PO TABS
ORAL_TABLET | ORAL | Status: AC
  Filled 2020-11-14: qty 1

## 2020-11-14 MED ORDER — FENTANYL CITRATE (PF) 100 MCG/2ML IJ SOLN
INTRAMUSCULAR | Status: DC | PRN
  Administered 2020-11-14: 50 ug via INTRAVENOUS

## 2020-11-14 MED ORDER — 0.9 % SODIUM CHLORIDE (POUR BTL) OPTIME
TOPICAL | Status: DC | PRN
  Administered 2020-11-14: 50 mL

## 2020-11-14 MED ORDER — LIDOCAINE-EPINEPHRINE 1 %-1:100000 IJ SOLN
INTRAMUSCULAR | Status: DC | PRN
  Administered 2020-11-14: 7 mL

## 2020-11-14 SURGICAL SUPPLY — 39 items
ADH SKN CLS APL DERMABOND .7 (GAUZE/BANDAGES/DRESSINGS) ×1
BLADE CLIPPER SURG (BLADE) ×2 IMPLANT
BLADE SURG 15 STRL LF DISP TIS (BLADE) ×1 IMPLANT
BLADE SURG 15 STRL SS (BLADE) ×2
COTTON BALL STRL MEDIUM (GAUZE/BANDAGES/DRESSINGS) ×2 IMPLANT
DERMABOND ADVANCED (GAUZE/BANDAGES/DRESSINGS) ×1
DERMABOND ADVANCED .7 DNX12 (GAUZE/BANDAGES/DRESSINGS) ×1 IMPLANT
DRAPE LAPAROTOMY 77X122 PED (DRAPES) ×2 IMPLANT
DRSG TELFA 4X3 1S NADH ST (GAUZE/BANDAGES/DRESSINGS) ×2 IMPLANT
ELECT CAUTERY BLADE 6.4 (BLADE) ×2 IMPLANT
ELECT REM PT RETURN 9FT ADLT (ELECTROSURGICAL) ×2
ELECTRODE REM PT RTRN 9FT ADLT (ELECTROSURGICAL) ×1 IMPLANT
GAUZE 4X4 16PLY ~~LOC~~+RFID DBL (SPONGE) ×2 IMPLANT
GLOVE SURG ENC MOIS LTX SZ7 (GLOVE) ×2 IMPLANT
GLOVE SURG SYN 8.0 (GLOVE) ×2 IMPLANT
GLOVE SURG UNDER LTX SZ7.5 (GLOVE) ×2 IMPLANT
GOWN STRL REUS W/ TWL LRG LVL3 (GOWN DISPOSABLE) ×1 IMPLANT
GOWN STRL REUS W/ TWL XL LVL3 (GOWN DISPOSABLE) ×2 IMPLANT
GOWN STRL REUS W/TWL LRG LVL3 (GOWN DISPOSABLE) ×2
GOWN STRL REUS W/TWL XL LVL3 (GOWN DISPOSABLE) ×4
LABEL OR SOLS (LABEL) ×2 IMPLANT
MANIFOLD NEPTUNE II (INSTRUMENTS) ×2 IMPLANT
NEEDLE HYPO 25X1 1.5 SAFETY (NEEDLE) ×4 IMPLANT
NS IRRIG 500ML POUR BTL (IV SOLUTION) ×2 IMPLANT
PACK BASIN MINOR ARMC (MISCELLANEOUS) ×2 IMPLANT
SOL PREP PVP 2OZ (MISCELLANEOUS) ×2
SOLUTION PREP PVP 2OZ (MISCELLANEOUS) ×1 IMPLANT
SUCTION FRAZIER HANDLE 10FR (MISCELLANEOUS) ×1
SUCTION TUBE FRAZIER 10FR DISP (MISCELLANEOUS) ×1 IMPLANT
SUT MNCRL AB 4-0 PS2 18 (SUTURE) ×2 IMPLANT
SUT PROLENE 6 0 BV (SUTURE) ×2 IMPLANT
SUT SILK 3 0 (SUTURE) ×2
SUT SILK 3-0 18XBRD TIE 12 (SUTURE) ×1 IMPLANT
SUT SILK 4 0 (SUTURE) ×2
SUT SILK 4-0 18XBRD TIE 12 (SUTURE) ×1 IMPLANT
SUT VIC AB 3-0 SH 27 (SUTURE) ×2
SUT VIC AB 3-0 SH 27X BRD (SUTURE) ×1 IMPLANT
SYR 10ML LL (SYRINGE) ×4 IMPLANT
SYR BULB IRRIG 60ML STRL (SYRINGE) ×2 IMPLANT

## 2020-11-14 NOTE — Anesthesia Preprocedure Evaluation (Signed)
Anesthesia Evaluation  Patient identified by MRN, date of birth, ID band Patient awake    Reviewed: Allergy & Precautions, H&P , NPO status , Patient's Chart, lab work & pertinent test results, reviewed documented beta blocker date and time   Airway Mallampati: II  TM Distance: >3 FB Neck ROM: full    Dental  (+) Edentulous Upper,    Pulmonary neg pulmonary ROS, COPD,  COPD inhaler, Current Smoker and Patient abstained from smoking.,  S/p radiation to bilateral lungs for nodules   Pulmonary exam normal        Cardiovascular Exercise Tolerance: Poor METS: 3 - Mets hypertension, On Medications + CAD, + Peripheral Vascular Disease and +CHF (chronic systolic heart failure)  Normal cardiovascular exam Rhythm:regular Rate:Normal  ECHO 2019 - Left ventricle: The cavity size was normal. Systolic function was  mildly reduced. The estimated ejection fraction was in the range  of 45% to 50%. Diffuse hypokinesis. Regional wall motion  abnormalities cannot be excluded. Features are consistent with a  pseudonormal left ventricular filling pattern, with concomitant  abnormal relaxation and increased filling pressure (grade 2  diastolic dysfunction).  - Mitral valve: There was mild regurgitation.  - Right ventricle: Systolic function was normal.  - Tricuspid valve: There was mild-moderate regurgitation.  - Pulmonary arteries: Systolic pressure was mildly elevated. PA  peak pressure: 44 mm Hg (S).    Neuro/Psych  Headaches, PSYCHIATRIC DISORDERS ([place] abuse)    GI/Hepatic negative GI ROS, Neg liver ROS,   Endo/Other  negative endocrine ROSdiabetes  Renal/GU CRFRenal diseasenegative Renal ROS  negative genitourinary   Musculoskeletal negative musculoskeletal ROS (+)   Abdominal Normal abdominal exam  (+) - obese,   Peds  Hematology negative hematology ROS (+)   Anesthesia Other Findings    Reproductive/Obstetrics                             Anesthesia Physical  Anesthesia Plan  ASA: 3  Anesthesia Plan: General   Post-op Pain Management:    Induction: Intravenous  PONV Risk Score and Plan: 1 and Ondansetron  Airway Management Planned:   Additional Equipment:   Intra-op Plan:   Post-operative Plan:   Informed Consent: I have reviewed the patients History and Physical, chart, labs and discussed the procedure including the risks, benefits and alternatives for the proposed anesthesia with the patient or authorized representative who has indicated his/her understanding and acceptance.     Dental Advisory Given and Dental advisory given  Plan Discussed with: CRNA, Anesthesiologist and Surgeon  Anesthesia Plan Comments:         Anesthesia Quick Evaluation

## 2020-11-14 NOTE — Discharge Instructions (Addendum)

## 2020-11-14 NOTE — Transfer of Care (Signed)
Immediate Anesthesia Transfer of Care Note  Patient: Philip Wood  Procedure(s) Performed: BIOPSY TEMPORAL ARTERY (Right)  Patient Location: PACU  Anesthesia Type:General and MAC combined with regional for post-op pain  Level of Consciousness: awake  Airway & Oxygen Therapy: Patient Spontanous Breathing  Post-op Assessment: Report given to RN  Post vital signs: stable  Last Vitals:  Vitals Value Taken Time  BP    Temp    Pulse 58 11/14/20 1055  Resp 17 11/14/20 1055  SpO2 99 % 11/14/20 1055  Vitals shown include unvalidated device data.  Last Pain:  Vitals:   11/14/20 1051  TempSrc:   PainSc: 0-No pain      Patients Stated Pain Goal: 0 (36/43/83 7793)  Complications: No notable events documented.

## 2020-11-14 NOTE — Op Note (Signed)
        OPERATIVE NOTE   PRE-OPERATIVE DIAGNOSIS: suspected temporal arteritis, persistent severe right sided headache  POST-OPERATIVE DIAGNOSIS: Same as above  PROCEDURE: 1.   Right temporal artery biopsy  SURGEON: [name], MD  ASSISTANT(S): None  ANESTHESIA: MAC  ESTIMATED BLOOD LOSS: Minimal  FINDING(S): 1.  none  SPECIMEN(S):  Right  superficial temporal artery sent to pathology  INDICATIONS:   Patient is a 75 y.o. male who presents with persistent right-sided headache. We were consulted for consideration for temporal artery biopsy. Risks and benefits were discussed and he was agreeable to proceed.  DESCRIPTION: After obtaining full informed written consent, the patient was brought back to the operating room and placed supine upon the operating table.  The patient received IV antibiotics prior to induction.  After obtaining adequate anesthesia, the patient was prepped and draped in the standard fashion. The area in front of his right ear was anesthetized copiously with a solution of 1% lidocaine and half percent Marcaine without epinephrine. I then made an incision just in front of the right ear overlying the palpable pulse. I then dissected down through the subcutaneous tissues and identified the superficial temporal artery. This was dissected out over a several centimeters and branches were ligated and divided between silk ties. Care was used to avoid electrocautery around the artery. I then clamped the artery proximally and distally and transected the artery. The specimen was then sent to pathology. The proximal and distal artery were ligated with 3-0 silk ties. Hemostasis was achieved. The wound was then closed with a series of interrupted 3-0 Vicryl's and the skin was closed with a 4-0 Monocryl. Sterile dressing was placed. The patient was taken to the recovery room in stable condition having tolerated the procedure well.  COMPLICATIONS: None  CONDITION:  Stable   [name] 11/14/2020 10:53 AM  This note was created with Dragon Medical transcription system. Any errors in dictation are purely unintentional.

## 2020-11-14 NOTE — Interval H&P Note (Signed)
History and Physical Interval Note:  11/14/2020 8:40 AM  Philip Wood  has presented today for surgery, with the diagnosis of TEMPORAL ARTERITIS.  The various methods of treatment have been discussed with the patient and family. After consideration of risks, benefits and other options for treatment, the patient has consented to  Procedure(s): BIOPSY TEMPORAL ARTERY (Right) as a surgical intervention.  The patient's history has been reviewed, patient examined, no change in status, stable for surgery.  I have reviewed the patient's chart and labs.  Questions were answered to the patient's satisfaction.     [name]

## 2020-11-15 LAB — SURGICAL PATHOLOGY

## 2020-11-21 ENCOUNTER — Other Ambulatory Visit: Payer: Self-pay | Admitting: Nurse Practitioner

## 2020-11-26 NOTE — H&P (Signed)
Subjective:     Patient ID: Philip Wood, male    DOB: 07/28/1945, 75 y.o.   MRN: [id number]     Chief Complaint  Patient presents with   New Patient (Initial Visit)      Ref Cannady bruit of left carotid artery      Philip Wood is a 75 year old man that presents today on referral after his primary care provider heard a carotid bruit.  In addition, there was a letter sent by his ophthalmologist with concern for possible temporal artery biopsy.  Patient denies any TIA or CVA-like symptoms.  He currently is not on prednisone but notes that he was placed on it for about 2 weeks and it was helpful for his headache.  He denies any jaw pain or changes in his vision.  The pain is right-sided.  It is not tender with palpation.   Today noninvasive studies show 1 to 39% stenosis in the bilateral internal carotid arteries however the left common carotid has a hemodynamically significant plaque greater than 50%.  The left mid common carotid artery is narrow velocities suggesting greater than 50%.   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   COPD (chronic obstructive pulmonary disease) (HCC)     Diabetes mellitus without complication (HCC)     GERD (gastroesophageal reflux disease)      Hyperlipidemia     Hypertension     Lumbago     Neuropathy     Tobacco abuse disorder        Social History         Socioeconomic History   Marital status: Married      Spouse name: Not on file   Number of children: Not on file   Years of education: Not on file   Highest education level: 10th grade  Occupational History   Not on file  Tobacco Use   Smoking status: Light Smoker      Packs/day: 0.60      Years: 53.00      Pack years: 31.80      Types: Cigarettes   Smokeless tobacco: Never   Tobacco comments:      Aprrox 10 cigs/day  Vaping Use   Vaping Use: Never used  Substance and Sexual Activity   Alcohol use: No      Alcohol/week: 0.0 standard drinks   Drug use: No   Sexual activity: Not Currently  Other Topics Concern   Not on file  Social History Narrative    Works part time at [employer]    Social Determinants of Scientist, research (life sciences) Strain: Low Risk   Difficulty of Paying Living Expenses: Not hard at all  Food Insecurity: No Food Insecurity   Worried About Charity fundraiser in the Last Year: Never true   Arboriculturist in the Last Year: Never true  Transportation Needs: No Transportation Needs   Lack of Transportation (Medical): No   Lack of Transportation (Non-Medical): No  Physical Activity: Inactive   Days of Exercise per Week: 0 days   Minutes of Exercise per Session: 0 min  Stress: No Stress Concern Present   Feeling of Stress : Not at all  Social Connections: Not on file  Intimate Partner Violence: Not on file           Past Surgical History:  Procedure Laterality Date   CHOLECYSTECTOMY       ELECTROMAGNETIC NAVIGATION BROCHOSCOPY N/A 08/19/2017    Procedure: ELECTROMAGNETIC NAVIGATION BRONCHOSCOPY;  Surgeon: Flora Lipps, MD;  Location: [facility];  Service: Cardiopulmonary;  Laterality: N/A;   ELECTROMAGNETIC NAVIGATION BROCHOSCOPY Left 09/02/2017    Procedure: ELECTROMAGNETIC NAVIGATION BRONCHOSCOPY;  Surgeon: Flora Lipps, MD;   Location: [facility];  Service: Cardiopulmonary;  Laterality: Left;   THROAT SURGERY               Family History  Problem Relation Age of Onset   Heart disease Father     Heart attack Father     Brain cancer Sister     Cervical cancer Sister     Rectal cancer Brother     Lung cancer Brother     Lung cancer Brother     Lung cancer Brother     Stomach cancer Sister     Lung cancer Brother     Stomach cancer Sister     Diabetes Sister     Lupus Sister  No Known Allergies   CBC Latest Ref Rng & Units 10/15/2020 09/20/2020 07/12/2019  WBC 3.4 - 10.8 x10E3/uL 6.7 6.9 5.8  Hemoglobin 13.0 - 17.7 g/dL 14.9 14.8 14.2  Hematocrit 37.5 - 51.0 % 43.8 42.7 42.2  Platelets 150 - 450 x10E3/uL 168 157 143(L)          CMP             Component Value Date/Time    NA 139 10/15/2020 1535    K 4.4 10/15/2020 1535    CL 99 10/15/2020 1535    CO2 24 10/15/2020 1535    GLUCOSE 101 (H) 10/15/2020 1535    GLUCOSE 137 (H) 07/12/2019 0937    BUN 15 10/15/2020 1535    CREATININE 1.01 10/15/2020 1535    CALCIUM 8.9 10/15/2020 1535    PROT 5.9 (L) 10/15/2020 1535    ALBUMIN 3.9 10/15/2020 1535    AST 11 10/15/2020 1535    ALT 11 10/15/2020 1535    ALKPHOS 108 10/15/2020 1535    BILITOT 0.2 10/15/2020 1535    GFRNONAA 62 05/31/2020 0904    GFRAA 72 05/31/2020 0904        No results found.      Assessment & Plan:    1. Bilateral carotid artery stenosis The patient remains asymptomatic with respect to the carotid stenosis.  However, the patient has now progressed and has a lesion the is >70%.   Patient should undergo CT angiography of the carotid arteries to define the degree of stenosis of the internal carotid arteries bilaterally and the anatomic suitability for surgery vs. intervention.   If the patient does indeed need surgery cardiac clearance will be required, once cleared the patient will be scheduled for surgery.   The risks, benefits and alternative therapies were  reviewed in detail with the patient.  All questions were answered.  The patient agrees to proceed with imaging.   Continue antiplatelet therapy as prescribed. Continue management of CAD, HTN and Hyperlipidemia. Healthy heart diet, encouraged exercise at least 4 times per week.   - CT ANGIO NECK W OR WO CONTRAST; Future - Creatinine, IStat   2. New onset headache The patient's symptoms are concerning for possible temporal arteritis.  Typically the only true diagnosis is temporal artery biopsy.  The patient did have a short course of steroids it was helpful and it was noticed that the headache returned after the steroids.  We discussed the risk, benefits and alternatives to a temporal artery biopsy.  We also discussed that ongoing treatment will be provided by his PCP or ophthalmologist depending on the results.   3. Hyperlipidemia associated with type 2 diabetes mellitus ([place]) Continue statin as ordered and reviewed, no changes at this time            Current Outpatient Medications on File Prior to Visit  Medication Sig Dispense Refill   allopurinol (ZYLOPRIM) 100 MG tablet Take 1 tablet (100 mg total) by mouth daily. (Patient taking differently: Take 100 mg by mouth in the morning.) 90 tablet 4   atorvastatin (LIPITOR) 80 MG tablet Take 1 tablet (80 mg total) by mouth daily at 6 PM. 90 tablet 4   gabapentin (NEURONTIN) 600 MG tablet Take 0.5 tablets (300 mg total) by mouth at bedtime. 30 tablet 2   metFORMIN (GLUCOPHAGE) 500 MG tablet TAKE 1 TABLET BY MOUTH TWICE DAILY WITH A MEAL (Patient taking differently: Take 500 mg by mouth 2 (two) times daily with a meal.)  180 tablet 0   metoprolol succinate (TOPROL XL) 25 MG 24 hr tablet Take 1 tablet (25 mg total) by mouth daily. (Patient taking differently: Take 25 mg by mouth in the morning.) 90 tablet 4   ramipril (ALTACE) 10 MG capsule Take 1 capsule (10 mg total) by mouth daily. (Patient taking differently: Take 10 mg by mouth in the morning.) 90  capsule 4   tiotropium (SPIRIVA HANDIHALER) 18 MCG inhalation capsule PLACE 1 CAPSULE INTO INHALER AND INHALE ITS CONTENTS ONCE DAILY. (Patient taking differently: Place 18 mcg into inhaler and inhale in the morning.) 90 capsule 4   tiZANidine (ZANAFLEX) 4 MG tablet Take 1 tablet (4 mg total) by mouth every 6 (six) hours as needed for muscle spasms. 30 tablet 0   vitamin B-12 (CYANOCOBALAMIN) 1000 MCG tablet Take 1,000 mcg by mouth daily at 12 noon.        No current facility-administered medications on file prior to visit.      There are no Patient Instructions on file for this visit. No follow-ups on file.     Katha Cabal, MD

## 2020-11-27 DIAGNOSIS — I10 Essential (primary) hypertension: Secondary | ICD-10-CM | POA: Insufficient documentation

## 2020-11-27 DIAGNOSIS — I6529 Occlusion and stenosis of unspecified carotid artery: Secondary | ICD-10-CM | POA: Insufficient documentation

## 2020-11-27 NOTE — Progress Notes (Signed)
MRN : [id number]  Philip Wood is a 75 y.o. (1945/08/07) male who presents with chief complaint of follow up after surgery.  History of Present Illness:   The patient is seen for follow up evaluation of carotid stenosis status post CT angiogram. CT scan was done 11/14/2020. Patient reports that the test went well with no problems or complications.   The patient denies interval amaurosis fugax. There is no recent or interval TIA symptoms or focal motor deficits. There is no prior documented CVA.  The patient is taking enteric-coated aspirin 81 mg daily.  There is a history of severe headaches.  And he is status post temporal artery biopsy on 11/14/2020.  Pathology report has returned negative for temporal arteritis.    The patient has a history of coronary artery disease, no recent episodes of angina or shortness of breath. The patient denies PAD or claudication symptoms. There is a history of hyperlipidemia which is being treated with a statin.    CT angiogram is reviewed by me personally and shows 80% stenosis consistent with calcified plaque at the origin of the left common carotid artery.    No outpatient medications have been marked as taking for the 11/29/20 encounter (Appointment) with Delana Meyer, Dolores Lory, MD.    Past Medical History:  Diagnosis Date   Bruit of left carotid artery    Cancer ([place])    laryngeal   Chronic heart failure ([place])    Chronic kidney disease    COPD (chronic obstructive pulmonary disease) ([place])    Coronary artery disease    Diabetes mellitus without complication (HCC)    GERD (gastroesophageal reflux disease)    Headache    History of kidney stones    Hyperlipidemia    Hypertension    Lumbago    Neuropathy    Tobacco abuse disorder     Past Surgical History:  Procedure Laterality Date   ARTERY BIOPSY Right 11/14/2020   Procedure: BIOPSY TEMPORAL ARTERY;  Surgeon: Katha Cabal, MD;  Location: [facility];  Service: Vascular;  Laterality:  Right;   CHOLECYSTECTOMY     ELECTROMAGNETIC NAVIGATION BROCHOSCOPY N/A 08/19/2017   Procedure: ELECTROMAGNETIC NAVIGATION BRONCHOSCOPY;  Surgeon: Flora Lipps, MD;  Location: [facility];  Service: Cardiopulmonary;  Laterality: N/A;   ELECTROMAGNETIC NAVIGATION BROCHOSCOPY Left 09/02/2017   Procedure: ELECTROMAGNETIC NAVIGATION BRONCHOSCOPY;  Surgeon: Flora Lipps, MD;  Location: [facility];  Service: Cardiopulmonary;  Laterality: Left;   THROAT SURGERY      Social History Social History   Tobacco Use   Smoking status: Light Smoker    Packs/day: 0.60    Years: 53.00    Pack years: 31.80    Types: Cigarettes   Smokeless tobacco: Never   Tobacco comments:    Aprrox 10 cigs/day  Vaping Use   Vaping Use: Never used  Substance Use Topics   Alcohol use: No    Alcohol/week: 0.0 standard drinks   Drug use: No    Family History Family History  Problem Relation Age of Onset   Heart disease Father    Heart attack Father    Brain cancer Sister    Cervical cancer Sister    Rectal cancer Brother    Lung cancer Brother    Lung cancer Brother    Lung cancer Brother    Stomach cancer Sister    Lung cancer Brother    Stomach cancer Sister    Diabetes Sister    Lupus Sister     No  Known Allergies   REVIEW OF SYSTEMS (Negative unless checked)  Constitutional: [] Weight loss  [] Fever  [] Chills Cardiac: [] Chest pain   [] Chest pressure   [] Palpitations   [] Shortness of breath when laying flat   [] Shortness of breath with exertion. Vascular:  [] Pain in legs with walking   [] Pain in legs at rest  [] History of DVT   [] Phlebitis   [] Swelling in legs   [] Varicose veins   [] Non-healing ulcers Pulmonary:   [] Uses home oxygen   [] Productive cough   [] Hemoptysis   [] Wheeze  [] COPD   [] Asthma Neurologic:  [] Dizziness   [] Seizures   [] History of stroke   [] History of TIA  [] Aphasia   [] Vissual changes   [] Weakness or numbness in arm   [] Weakness or numbness in leg Musculoskeletal:   [] Joint swelling    [] Joint pain   [] Low back pain Hematologic:  [] Easy bruising  [] Easy bleeding   [] Hypercoagulable state   [] Anemic Gastrointestinal:  [] Diarrhea   [] Vomiting  [] Gastroesophageal reflux/heartburn   [] Difficulty swallowing. Genitourinary:  [] Chronic kidney disease   [] Difficult urination  [] Frequent urination   [] Blood in urine Skin:  [] Rashes   [] Ulcers  Psychological:  [] History of anxiety   []  History of major depression.  Physical Examination  There were no vitals filed for this visit. There is no height or weight on file to calculate BMI. Gen: WD/WN, NAD Head: [place]/AT, No temporalis wasting.  Ear/Nose/Throat: Hearing grossly intact, nares w/o erythema or drainage Eyes: PER, EOMI, sclera nonicteric.  Neck: Supple, no masses.  No bruit or JVD.  Pulmonary:  Good air movement, no audible wheezing, no use of accessory muscles.  Cardiac: RRR, normal S1, S2, no Murmurs. Vascular:   left carotid bruit Vessel Right Left  Radial Palpable Palpable  Carotid Palpable Palpable  Gastrointestinal: soft, non-distended. No guarding/no peritoneal signs.  Musculoskeletal: M/S 5/5 throughout.  No visible deformity.  Neurologic: CN 2-12 intact. Pain and light touch intact in extremities.  Symmetrical.  Speech is fluent. Motor exam as listed above. Psychiatric: Judgment intact, Mood & affect appropriate for pt's clinical situation. Dermatologic: No rashes or ulcers noted.  No changes consistent with cellulitis.   CBC Lab Results  Component Value Date   WBC 5.4 11/13/2020   HGB 14.6 11/14/2020   HCT 43.0 11/14/2020   MCV 93.7 11/13/2020   PLT 177 11/13/2020    BMET    Component Value Date/Time   NA 141 11/14/2020 0753   NA 139 10/15/2020 1535   K 4.0 11/14/2020 0753   CL 102 11/14/2020 0753   CO2 26 11/13/2020 1152   GLUCOSE 115 (H) 11/14/2020 0753   BUN 18 11/14/2020 0753   BUN 15 10/15/2020 1535   CREATININE 1.10 11/14/2020 0753   CALCIUM 9.1 11/13/2020 1152   GFRNONAA >60 11/13/2020  1152   GFRAA 72 05/31/2020 0904   Estimated Creatinine Clearance: 58 mL/min (by C-G formula based on SCr of 1.1 mg/dL).  COAG No results found for: INR, PROTIME  Radiology CT ANGIO NECK W OR WO CONTRAST  Result Date: 11/14/2020 CLINICAL DATA:  Carotid stenosis screening, no risk factors. EXAM: CT ANGIOGRAPHY NECK TECHNIQUE: Multidetector CT imaging of the neck was performed using the standard protocol during bolus administration of intravenous contrast. Multiplanar CT image reconstructions and MIPs were obtained to evaluate the vascular anatomy. Carotid stenosis measurements (when applicable) are obtained utilizing NASCET criteria, using the distal internal carotid diameter as the denominator. CONTRAST:  33mL OMNIPAQUE IOHEXOL 350 MG/ML SOLN COMPARISON:  CT  chest July 30, 2020. FINDINGS: Aortic arch: Atherosclerosis. Great vessel origins are patent. Approximately 30% stenosis of the left subclavian artery origin due to atherosclerosis. Right carotid system: Ulcerated atherosclerosis of the common carotid artery without greater than 50% narrowing of the common carotid artery. Calcific and noncalcific atherosclerosis at the carotid bifurcation with approximately 40% stenosis. Left carotid system: Predominately non calcific atherosclerosis of the common carotid artery with areas of ulceration and approximately 70% stenosis of the common carotid artery at the C6-C7 level. Atherosclerosis at the carotid bifurcation with approximately Vertebral arteries: Bilateral vertebral arteries are patent. Moderate narrowing of the proximal right vertebral artery and severe narrowing of the right vertebral artery at the C6 level, proximally sign%. Tortuous left vertebral artery proximally. Skeleton: Mild multilevel degenerative change of the cervical spine. Other neck: No acute abnormality. Upper chest: Although partially imaged, suspected interval increase in size/bulk of soft tissue in the right hilum and right  paratracheal mediastinum with extension into the adjacent lung. For example, spiculated soft tissue in the right upper lobe measures approximately 2.4 x 2.7 cm on series 6, image 38, previously 1.9 x 1.7 cm. IMPRESSION: 1. Bilateral ulcerated common carotid artery atherosclerosis with approximately 70% stenosis of the left common carotid artery at the C6-C7 level. 2. Severe (approximately 70%) narrowing of the right vertebral artery at the C7 level with moderate narrowing proximally. 3. Bilateral carotid bifurcation atherosclerosis with approximately 40% stenosis of the proximal internal carotid arteries bilaterally. 4. Approximately 30% stenosis of the left subclavian artery origin. 5. Although partially imaged, suspected interval increase in size/bulk of soft tissue in the right hilum and right paratracheal mediastinum with extension into the adjacent lung. Please see recent CT chest from July 30 2020 for further characterization, differential considerations, and follow-up recommendations. A dedicated CT chest could provide complete intrathoracic evaluation and better direct comparison if clinically indicated. Electronically Signed   By: Margaretha Sheffield MD   On: 11/14/2020 13:49   VAS US CAROTID  Result Date: 11/06/2020 Carotid Arterial Duplex Study Patient Name:  SHERVIN CYPERT  Date of Exam:   11/01/2020 Medical Rec #: [id number]          Accession #:    [id number] Date of Birth: 1945/11/24           Patient Gender: M Patient Age:   80Y Exam Location:  [facility] Procedure:      VAS US CAROTID Referring Phys: [id number] Kris Hartmann --------------------------------------------------------------------------------  Indications: Left bruit. Performing Technologist: Charlane Ferretti RT (R)(VS)  Examination Guidelines: A complete evaluation includes B-mode imaging, spectral Doppler, color Doppler, and power Doppler as needed of all accessible portions of each vessel. Bilateral testing is considered  an integral part of a complete examination. Limited examinations for reoccurring indications may be performed as noted.  Right Carotid Findings: +----------+--------+--------+--------+------------------+---------------------+           PSV cm/sEDV cm/sStenosisPlaque DescriptionComments              +----------+--------+--------+--------+------------------+---------------------+ CCA Prox  104     9                                 tortuous. intimal  thickening            +----------+--------+--------+--------+------------------+---------------------+ CCA Mid   129     19                                tortuous, intimal                                                         thickening            +----------+--------+--------+--------+------------------+---------------------+ CCA Distal100     14                                intimal thickening    +----------+--------+--------+--------+------------------+---------------------+ ICA Prox  64      14                                ICA/CCA ratio = .70   +----------+--------+--------+--------+------------------+---------------------+ ICA Mid   85      25                                tortuous              +----------+--------+--------+--------+------------------+---------------------+ ICA Distal85      23                                tortuous              +----------+--------+--------+--------+------------------+---------------------+ ECA       106     5                                                       +----------+--------+--------+--------+------------------+---------------------+ +----------+--------+-------+-----------+-------------------+           PSV cm/sEDV cmsDescribe   Arm Pressure (mmHG) +----------+--------+-------+-----------+-------------------+ Subclavian70             Multiphasic                     +----------+--------+-------+-----------+-------------------+ +---------+--------+--+--------+--+---------+ VertebralPSV cm/s69EDV cm/s13Antegrade +---------+--------+--+--------+--+---------+ Left Carotid Findings: +----------+--------+--------+--------+------------------+-------------------+           PSV cm/sEDV cm/sStenosisPlaque DescriptionComments            +----------+--------+--------+--------+------------------+-------------------+ CCA Prox  64      13                                tortuous            +----------+--------+--------+--------+------------------+-------------------+ CCA Mid   416     80                                tortuous, narrowing +----------+--------+--------+--------+------------------+-------------------+ CCA Distal56      22  heterogenous      intimal thickening  +----------+--------+--------+--------+------------------+-------------------+ ICA Prox  58      21                                intimal thickening  +----------+--------+--------+--------+------------------+-------------------+ ICA Mid   74      30                                intimal thickening  +----------+--------+--------+--------+------------------+-------------------+ ICA Distal61      23                                tortuous            +----------+--------+--------+--------+------------------+-------------------+ ECA       67      10                                ICA/CCA ratio =.80  +----------+--------+--------+--------+------------------+-------------------+ +----------+--------+--------+-----------+-------------------+           PSV cm/sEDV cm/sDescribe   Arm Pressure (mmHG) +----------+--------+--------+-----------+-------------------+ [id number]             Multiphasic                    +----------+--------+--------+-----------+-------------------+ +---------+--------+---+--------+--+---------+ VertebralPSV  cm/s116EDV cm/s27Antegrade +---------+--------+---+--------+--+---------+ Summary: Right Carotid: Velocities in the right ICA are consistent with a 1-39% stenosis. Left Carotid: Velocities in the left ICA are consistent with a 1-39% stenosis.               Hemodynamically significant plaque >50% visualized in the CCA.               Left mid CCA is narrow with an elevated velocity suggesting a               greater han 50% stenosis. Vertebrals:  Bilateral vertebral arteries demonstrate antegrade flow. Subclavians: Normal flow hemodynamics were seen in bilateral subclavian              arteries. *See table(s) above for measurements and observations.  Electronically signed by Leotis Pain MD on 11/06/2020 at 1:33:02 PM.    Final      Assessment/Plan 1. Stenosis of left carotid artery Recommend:  Temporal artery biopsy is negative for arteritis and his HA are relieved with two Tylenol.  The patient is symptomatic with respect to the carotid stenosis.  The patient now has progressed and has a lesion the is >80%.  Patient's CT angiography of the carotid arteries confirms >80% left CCA stenosis.  The anatomical considerations support stenting over surgery.  This was discussed in detail with the patient.  The risks, benefits and alternative therapies were reviewed in detail with the patient.  All questions were answered.  The patient agrees to proceed with stenting of the left common carotid artery.  Continue antiplatelet therapy as prescribed add Plavix. Continue management of CAD, HTN and Hyperlipidemia. Healthy heart diet, encouraged exercise at least 4 times per week.    2. Chronic systolic heart failure (HCC) Continue cardiac and antihypertensive medications as already ordered and reviewed, no changes at this time.  Continue statin as ordered and reviewed, no changes at this time  Nitrates PRN for chest pain   3. Essential hypertension Continue antihypertensive  medications as already  ordered, these medications have been reviewed and there are no changes at this time.   4. Diabetes mellitus due to underlying condition with chronic kidney disease, without long-term current use of insulin, unspecified CKD stage ([place]) Continue hypoglycemic medications as already ordered, these medications have been reviewed and there are no changes at this time.  Hgb A1C to be monitored as already arranged by primary service   5. Hyperlipidemia associated with type 2 diabetes mellitus ([place]) Continue statin as ordered and reviewed, no changes at this time     [name], MD  11/27/2020 2:33 PM

## 2020-11-28 ENCOUNTER — Ambulatory Visit (INDEPENDENT_AMBULATORY_CARE_PROVIDER_SITE_OTHER): Payer: Medicare Other | Admitting: Nurse Practitioner

## 2020-11-28 ENCOUNTER — Encounter: Payer: Self-pay | Admitting: Nurse Practitioner

## 2020-11-28 ENCOUNTER — Other Ambulatory Visit: Payer: Self-pay

## 2020-11-28 VITALS — BP 130/74 | HR 67 | Temp 98.5°F | Wt 177.0 lb

## 2020-11-28 DIAGNOSIS — I6522 Occlusion and stenosis of left carotid artery: Secondary | ICD-10-CM

## 2020-11-28 DIAGNOSIS — E0822 Diabetes mellitus due to underlying condition with diabetic chronic kidney disease: Secondary | ICD-10-CM | POA: Diagnosis not present

## 2020-11-28 DIAGNOSIS — C3490 Malignant neoplasm of unspecified part of unspecified bronchus or lung: Secondary | ICD-10-CM | POA: Diagnosis not present

## 2020-11-28 DIAGNOSIS — F17219 Nicotine dependence, cigarettes, with unspecified nicotine-induced disorders: Secondary | ICD-10-CM | POA: Diagnosis not present

## 2020-11-28 DIAGNOSIS — N1831 Chronic kidney disease, stage 3a: Secondary | ICD-10-CM | POA: Diagnosis not present

## 2020-11-28 DIAGNOSIS — E785 Hyperlipidemia, unspecified: Secondary | ICD-10-CM

## 2020-11-28 DIAGNOSIS — E538 Deficiency of other specified B group vitamins: Secondary | ICD-10-CM | POA: Diagnosis not present

## 2020-11-28 DIAGNOSIS — R7982 Elevated C-reactive protein (CRP): Secondary | ICD-10-CM

## 2020-11-28 DIAGNOSIS — I130 Hypertensive heart and chronic kidney disease with heart failure and stage 1 through stage 4 chronic kidney disease, or unspecified chronic kidney disease: Secondary | ICD-10-CM | POA: Diagnosis not present

## 2020-11-28 DIAGNOSIS — R519 Headache, unspecified: Secondary | ICD-10-CM | POA: Diagnosis not present

## 2020-11-28 DIAGNOSIS — J432 Centrilobular emphysema: Secondary | ICD-10-CM | POA: Diagnosis not present

## 2020-11-28 DIAGNOSIS — E1169 Type 2 diabetes mellitus with other specified complication: Secondary | ICD-10-CM | POA: Diagnosis not present

## 2020-11-28 DIAGNOSIS — N183 Chronic kidney disease, stage 3 unspecified: Secondary | ICD-10-CM

## 2020-11-28 DIAGNOSIS — I5022 Chronic systolic (congestive) heart failure: Secondary | ICD-10-CM | POA: Diagnosis not present

## 2020-11-28 NOTE — Assessment & Plan Note (Signed)
Chronic, ongoing with BP at goal today.  Home BP at goal per patient.  Continue current medication regimen and collaboration with cardiology.  CMP today.  Recommend focus on DASH diet at home and continue to monitor BP and weight closely. Refills sent in as needed.  Return in 6 months.

## 2020-11-28 NOTE — Progress Notes (Signed)
BP 130/74   Pulse 67   Temp 98.5 F (36.9 C) (Oral)   Wt 177 lb (80.3 kg)   SpO2 97%   BMI 26.14 kg/m    Subjective:    Patient ID: Philip Wood, male    DOB: 1945/07/13, 75 y.o.   MRN: [id number]  HPI: Philip Wood is a 75 y.o. male  Chief Complaint  Patient presents with   Diabetes   Hyperlipidemia   Hypertension   DIABETES Continues on Metformin 500 MG BID.  Last A1C was 5.9% in June 2022. Hypoglycemic episodes:no Polydipsia/polyuria: no Visual disturbance: no Chest pain: no Paresthesias: no Glucose Monitoring: no  Accucheck frequency: Not Checking  Fasting glucose:  Post prandial:  Evening:  Before meals: Taking Insulin?: no  Long acting insulin:  Short acting insulin: Blood Pressure Monitoring: a few times a week Retinal Examination: Up To Date Foot Exam: Up to Date Pneumovax: Up to Date Influenza: Up to Date Aspirin: yes   HYPERTENSION / HYPERLIPIDEMIA/HF Continues Ramipril, Metoprolol, and Atorvastatin + ASA.  Is followed by cardiology and last saw 12/29/19 -- has upcoming visit.  Last echo EF 45-50% in 2019.   States weight at home is staying around 175 to 180 range. Satisfied with current treatment? yes Duration of hypertension: chronic BP monitoring frequency: a few times a week BP range: 120/70's average BP medication side effects: no Duration of hyperlipidemia: chronic Cholesterol medication side effects: no Cholesterol supplements: none Medication compliance: good compliance Aspirin: yes Recent stressors: no Recurrent headaches: no Visual changes: no Palpitations: no Dyspnea: baseline, no worse Chest pain: no Lower extremity edema: no Dizzy/lightheaded: no   LUNG CANCER (Right upper lobe nodule ): Followed by oncology and received radiation treatment, no further treatments at this time.  Last saw Dr. Baruch Gouty 08/01/20. Smokes more than 1/2 PPD, he tried to quit but was unsuccessful.  Is not interested in quitting at this time.   Last followed by pulmonary in 2019, Dr. Mortimer Fries, for COPD.  Last CT scan 07/30/20 emphysema and aortic atherosclerosis -- scheduled for next one in October.  Continues to use inhalers. COPD status: stable Satisfied with current treatment?: yes Oxygen use: no Dyspnea frequency: occasional Cough frequency: occasional Rescue inhaler frequency: does not have Limitation of activity: no Productive cough: none Last Spirometry: 2019 Pneumovax: Up to Date Influenza: Up to Date  HEADACHES Reports headaches have improved, takes 2 Tylenol in morning and no issues throughout day time.  Seeing vascular, has follow-up tomorrow to determine next steps for carotid stenosis -- 70% to left.  Recent biopsy temporal artery was normal.  Denies slurred speech, weakness, vision changes, amaurosis fugax, jaw claudication, or facial droop.  Recent mild elevation CRP noted 10/15/20 = 17. Duration: days Severity: 3/10 Quality: aching to right side only Frequency: intermittent Location: frontal aspect and then around to right side Headache duration: 30 minutes Radiation: no Time of day headache occurs: all day Alleviating factors: Tylenol and Toradol Aggravating factors: coughing  Headache status at time of visit: none Treatments attempted: Treatments attempted: rest and APAP   Aura: no Nausea:  no Vomiting: no Photophobia:  no Phonophobia:  no Effect on social functioning:  no Numbers of missed days of school/work each month: none Confusion:  no Gait disturbance/ataxia:  no Behavioral changes:  no Fevers:  no   Relevant past medical, surgical, family and social history reviewed and updated as indicated. Interim medical history since our last visit reviewed. Allergies and medications reviewed and updated.  Review  of Systems  Constitutional:  Negative for activity change, diaphoresis, fatigue and fever.  Respiratory:  Negative for cough, chest tightness, shortness of breath and wheezing.    Cardiovascular:  Negative for chest pain, palpitations and leg swelling.  Gastrointestinal: Negative.   Endocrine: Negative for cold intolerance, heat intolerance, polydipsia, polyphagia and polyuria.  Neurological:  Positive for headaches. Negative for dizziness, syncope, facial asymmetry, speech difficulty, weakness, light-headedness and numbness.  Psychiatric/Behavioral: Negative.     Per HPI unless specifically indicated above     Objective:    BP 130/74   Pulse 67   Temp 98.5 F (36.9 C) (Oral)   Wt 177 lb (80.3 kg)   SpO2 97%   BMI 26.14 kg/m   Wt Readings from Last 3 Encounters:  11/28/20 177 lb (80.3 kg)  11/14/20 177 lb 14.6 oz (80.7 kg)  11/13/20 178 lb (80.7 kg)    Physical Exam Vitals and nursing note reviewed.  Constitutional:      General: He is awake. He is not in acute distress.    Appearance: He is well-developed and well-groomed. He is not ill-appearing or toxic-appearing.  HENT:     Head: Normocephalic and atraumatic.     Right Ear: Hearing, tympanic membrane, ear canal and external ear normal. No drainage.     Left Ear: Hearing, tympanic membrane, ear canal and external ear normal. No drainage.     Nose:     Right Sinus: No maxillary sinus tenderness or frontal sinus tenderness.     Left Sinus: No maxillary sinus tenderness or frontal sinus tenderness.  Eyes:     General: Lids are normal.        Right eye: No discharge.        Left eye: No discharge.     Extraocular Movements: Extraocular movements intact.     Conjunctiva/sclera: Conjunctivae normal.     Pupils: Pupils are equal, round, and reactive to light.     Visual Fields: Right eye visual fields normal and left eye visual fields normal.  Neck:     Thyroid: No thyromegaly.     Vascular: Carotid bruit (left side noted) present.  Cardiovascular:     Rate and Rhythm: Normal rate and regular rhythm.     Heart sounds: Normal heart sounds, S1 normal and S2 normal. No murmur heard.   No gallop.   Pulmonary:     Effort: Pulmonary effort is normal. No accessory muscle usage or respiratory distress.     Breath sounds: Decreased breath sounds and wheezing present.     Comments: Decreased sounds throughout with expiratory wheezes per baseline. Abdominal:     General: Bowel sounds are normal.     Palpations: Abdomen is soft.  Musculoskeletal:        General: Normal range of motion.     Cervical back: Normal range of motion and neck supple.     Right lower leg: No edema.     Left lower leg: No edema.  Lymphadenopathy:     Cervical: No cervical adenopathy.  Skin:    General: Skin is warm and dry.  Neurological:     Mental Status: He is alert and oriented to person, place, and time.     Cranial Nerves: Cranial nerves are intact.     Motor: Motor function is intact.     Coordination: Coordination is intact.     Gait: Gait is intact.     Deep Tendon Reflexes: Reflexes are normal and symmetric.  Reflex Scores:      Brachioradialis reflexes are 2+ on the right side and 2+ on the left side.      Patellar reflexes are 2+ on the right side and 2+ on the left side. Psychiatric:        Attention and Perception: Attention normal.        Mood and Affect: Mood normal.        Speech: Speech normal.        Behavior: Behavior normal. Behavior is cooperative.        Thought Content: Thought content normal.   Results for orders placed or performed during the hospital encounter of 11/14/20  Glucose, capillary  Result Value Ref Range   Glucose-Capillary 111 (H) 70 - 99 mg/dL  Glucose, capillary  Result Value Ref Range   Glucose-Capillary 123 (H) 70 - 99 mg/dL  I-STAT, chem 8  Result Value Ref Range   Sodium 141 135 - 145 mmol/L   Potassium 4.0 3.5 - 5.1 mmol/L   Chloride 102 98 - 111 mmol/L   BUN 18 8 - 23 mg/dL   Creatinine, Ser 1.10 0.61 - 1.24 mg/dL   Glucose, Bld 115 (H) 70 - 99 mg/dL   Calcium, Ion 1.17 1.15 - 1.40 mmol/L   TCO2 28 22 - 32 mmol/L   Hemoglobin 14.6 13.0 - 17.0  g/dL   HCT 43.0 39.0 - 52.0 %  ABO/Rh  Result Value Ref Range   ABO/RH(D)      B POS Performed at [facility] [address], [place], [place] [address]   Surgical pathology  Result Value Ref Range   SURGICAL PATHOLOGY      SURGICAL PATHOLOGY CASE: [phone] [phone] PATIENT: Philip Wood Surgical Pathology Report     Specimen Submitted: A. Temporal artery, right  Clinical History: 75 year old male with persistent severe right sided headache and elevated CRP. Patient did receive steroids. Clinically suspect temporal arteritis      DIAGNOSIS: A.  TEMPORAL ARTERY, RIGHT; BIOPSY: - NEGATIVE FOR GIANT CELL ARTERITIS.  Comment: The artery was examined over 120 levels.  GROSS DESCRIPTION: A. Labeled: Right temporal artery biopsy Received: Formalin Collection time: 10:30 AM on 11/14/2020 Placed into formalin time: 10:31 AM on 11/14/2020 Tissue fragment(s): 1 Size: 3.2 cm long by 0.3 cm in diameter Description: Received on a Telfa pad is a fragment of tan-pink grossly unremarkable vessel with a scant amount of surrounding soft tissue. There are attached black and blue sutures.  The specimen is bisected. Entirely submitted in cassettes 1-2.  RB 11/14/2020  Final Diagnosis perform ed by Quay Burow, MD.   Electronically signed 11/15/2020 8:44:40AM The electronic signature indicates that the named Attending Pathologist has evaluated the specimen Technical component performed at [facility], [address] [place], [place] [address] Lab: [phone] Dir: Rush Farmer, MD, MMM  Professional component performed at [facility], [facility], [place], [place], [place] [address] Lab: [phone] Dir: Dellia Nims. 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  Home BP at goal per patient.  Continue current medication regimen and collaboration with cardiology.  CMP today.  Recommend focus on DASH diet at home and continue to monitor BP and weight closely. Refills sent in as  needed.  Return in 6 months.       Relevant Orders   Comprehensive metabolic panel   Chronic systolic heart failure (HCC)    Chronic, ongoing.  Euvolemic today. . Continue current medication regimen and collaboration with cardiology.  CMP today.  Recommend: - Reminded to call for an overnight weight gain of >2 pounds or a weekly weight weight of >5 pounds - not adding salt to his food and has been reading food labels. Reviewed the importance of keeping daily sodium intake to <2046m daily       Carotid stenosis    Ongoing, continue collaboration with vascular.  Appreciate their input.         Respiratory   Centrilobular emphysema (HCC)    Chronic, ongoing.  Continue current medication regimen.  Recommend complete smoking cessation.  Consider addition of Albuterol at next visit as rescue inhaler, he refuses this being sent in today.  At this time he denies symptoms.  Plan on spirometry next visit, discussed with patient.       Lung cancer ([facility]    Continue collaboration with oncology.  Recommend complete cessation of smoking.         Endocrine   Diabetes mellitus with chronic kidney disease ([facility]    Chronic, ongoing.  A1C 5.9% today, remaining very stable.  Urine ALB 80 and A:C 30-300 in February 2022, continue Ramipril for kidney protection.  Continue current medication regimen and adjust as needed.  Recommend he check BS at home at least a few mornings a week.  Consider reduction of medication next visit, if remains stable.  CMP today.  Return in 6 months.       Hyperlipidemia associated with type 2 diabetes mellitus (HCC)    Chronic, ongoing.  Continue current medication regimen and adjust as needed.  Lipid panel today.       Relevant Orders    Lipid Panel w/o Chol/HDL Ratio     Nervous and Auditory   Nicotine dependence, cigarettes, w unsp disorders    I have recommended complete cessation of tobacco use. I have discussed various options available for assistance with tobacco cessation including over the counter methods (Nicotine gum, patch and lozenges). We also discussed prescription options (Chantix, Nicotine Inhaler / Nasal Spray). The patient is not interested in pursuing any prescription tobacco cessation options at this time.          Other   B12 deficiency    Ongoing, continue current regimen and recheck levels as needed.       Relevant Orders   Vitamin B12   New onset headache    Improving at this time, continue collaboration with vascular.        Other Visit Diagnoses     Elevated C-reactive protein (CRP)       Recheck CRP and ESR + ANA today.   Relevant Orders   C-reactive protein   Sed Rate (ESR)   ANA w/Reflex if Positive        Follow up plan: Return for as scheduled in September for headache follow-up.

## 2020-11-28 NOTE — Assessment & Plan Note (Signed)
Chronic, ongoing.  Continue current medication regimen and adjust as needed. Lipid panel today. 

## 2020-11-28 NOTE — Assessment & Plan Note (Signed)
Ongoing, continue collaboration with vascular.  Appreciate their input.

## 2020-11-28 NOTE — Assessment & Plan Note (Signed)
Chronic, ongoing.  Euvolemic today. . Continue current medication regimen and collaboration with cardiology.  CMP today.  Recommend: - Reminded to call for an overnight weight gain of >2 pounds or a weekly weight weight of >5 pounds - not adding salt to his food and has been reading food labels. Reviewed the importance of keeping daily sodium intake to 2000mg  daily

## 2020-11-28 NOTE — Assessment & Plan Note (Signed)
Improving at this time, continue collaboration with vascular.

## 2020-11-28 NOTE — Patient Instructions (Signed)
Form - Headache Record There are many types and causes of headaches. A headache record can help guide your treatment plan. Use this form to record the details. Bring this form with you to your follow-up visits. Follow your health care provider's instructions on how to describe your headache. You may be asked to: Use a pain scale. This is a tool to rate the intensity of your headache using words or numbers. Describe what your headache feels like, such as dull, achy, throbbing, or sharp. Headache record Date: _______________ Time (from start to end): ____________________ Location of the headache: _________________________ Intensity of the headache: ____________________ Description of the headache: ______________________________________________________________ Hours of sleep the night before the headache: __________ Food or drinks before the headache started: ______________________________________________________________________________________ Events before the headache started: _______________________________________________________________________________________________ Symptoms before the headache started: __________________________________________________________________________________________ Symptoms during the headache: __________________________________________________________________________________________________ Treatment: ________________________________________________________________________________________________________________ Effect of treatment: _________________________________________________________________________________________________________ Other comments: ___________________________________________________________________________________________________________ Date: _______________ Time (from start to end): ____________________ Location of the headache: _________________________ Intensity of the headache: ____________________ Description of the headache:  ______________________________________________________________ Hours of sleep the night before the headache: __________ Food or drinks before the headache started: ______________________________________________________________________________________ Events before the headache started: ____________________________________________________________________________________________ Symptoms before the headache started: _________________________________________________________________________________________ Symptoms during the headache: _______________________________________________________________________________________________ Treatment: ________________________________________________________________________________________________________________ Effect of treatment: _________________________________________________________________________________________________________ Other comments: ___________________________________________________________________________________________________________ Date: _______________ Time (from start to end): ____________________ Location of the headache: _________________________ Intensity of the headache: ____________________ Description of the headache: ______________________________________________________________ Hours of sleep the night before the headache: __________ Food or drinks before the headache started: ______________________________________________________________________________________ Events before the headache started: ____________________________________________________________________________________________ Symptoms before the headache started: _________________________________________________________________________________________ Symptoms during the headache: _______________________________________________________________________________________________ Treatment:  ________________________________________________________________________________________________________________ Effect of treatment: _________________________________________________________________________________________________________ Other comments: ___________________________________________________________________________________________________________ Date: _______________ Time (from start to end): ____________________ Location of the headache: _________________________ Intensity of the headache: ____________________ Description of the headache: ______________________________________________________________ Hours of sleep the night before the headache: _________ Food or drinks before the headache started: ______________________________________________________________________________________ Events before the headache started: ____________________________________________________________________________________________ Symptoms before the headache started: _________________________________________________________________________________________ Symptoms during the headache: _______________________________________________________________________________________________ Treatment: ________________________________________________________________________________________________________________ Effect of treatment: _________________________________________________________________________________________________________ Other comments: ___________________________________________________________________________________________________________ Date: _______________ Time (from start to end): ____________________ Location of the headache: _________________________ Intensity of the headache: ____________________ Description of the headache: ______________________________________________________________ Hours of sleep the night before the headache: _________ Food or drinks before the headache started:  ______________________________________________________________________________________ Events before the headache started: ____________________________________________________________________________________________ Symptoms before the headache started: _________________________________________________________________________________________ Symptoms during the headache: _______________________________________________________________________________________________ Treatment: ________________________________________________________________________________________________________________ Effect of treatment: _________________________________________________________________________________________________________ Other comments: ___________________________________________________________________________________________________________ This information is not intended to replace advice given to you by your health care provider. Make sure you discuss any questions you have with your healthcare provider. Document Revised: 04/26/2018 Document Reviewed: 04/26/2018 Elsevier Patient Education  [place].

## 2020-11-28 NOTE — Assessment & Plan Note (Signed)
Chronic, ongoing.  A1C 5.9% today, remaining very stable.  Urine ALB 80 and A:C 30-300 in February 2022, continue Ramipril for kidney protection.  Continue current medication regimen and adjust as needed.  Recommend he check BS at home at least a few mornings a week.  Consider reduction of medication next visit, if remains stable.  CMP today.  Return in 6 months.

## 2020-11-28 NOTE — Assessment & Plan Note (Signed)
I have recommended complete cessation of tobacco use. I have discussed various options available for assistance with tobacco cessation including over the counter methods (Nicotine gum, patch and lozenges). We also discussed prescription options (Chantix, Nicotine Inhaler / Nasal Spray). The patient is not interested in pursuing any prescription tobacco cessation options at this time.  

## 2020-11-28 NOTE — Anesthesia Postprocedure Evaluation (Signed)
Anesthesia Post Note  Patient: Philip Wood  Procedure(s) Performed: BIOPSY TEMPORAL ARTERY (Right)  Patient location during evaluation: PACU Anesthesia Type: General Level of consciousness: awake and alert Pain management: pain level controlled Vital Signs Assessment: post-procedure vital signs reviewed and stable Respiratory status: spontaneous breathing, nonlabored ventilation, respiratory function stable and patient connected to nasal cannula oxygen Cardiovascular status: blood pressure returned to baseline and stable Postop Assessment: no apparent nausea or vomiting Anesthetic complications: no   No notable events documented.   Last Vitals:  Vitals:   11/14/20 1117 11/14/20 1152  BP: (!) 143/83 (!) 151/72  Pulse: (!) 59 (!) 59  Resp: 20 16  Temp: (!) 36.1 C (!) 36.1 C  SpO2: 97% 100%    Last Pain:  Vitals:   11/15/20 0844  TempSrc:   PainSc: [place]

## 2020-11-28 NOTE — Assessment & Plan Note (Signed)
Continue collaboration with oncology.  Recommend complete cessation of smoking.

## 2020-11-28 NOTE — Assessment & Plan Note (Signed)
Ongoing, continue current regimen and recheck levels as needed.

## 2020-11-28 NOTE — Assessment & Plan Note (Signed)
Chronic, ongoing.  Continue current medication regimen.  Recommend complete smoking cessation.  Consider addition of Albuterol at next visit as rescue inhaler, he refuses this being sent in today.  At this time he denies symptoms.  Plan on spirometry next visit, discussed with patient.

## 2020-11-29 ENCOUNTER — Encounter (INDEPENDENT_AMBULATORY_CARE_PROVIDER_SITE_OTHER): Payer: Self-pay | Admitting: Vascular Surgery

## 2020-11-29 ENCOUNTER — Ambulatory Visit (INDEPENDENT_AMBULATORY_CARE_PROVIDER_SITE_OTHER): Payer: Medicare Other | Admitting: Vascular Surgery

## 2020-11-29 VITALS — BP 127/69 | HR 78 | Resp 16 | Wt 177.6 lb

## 2020-11-29 DIAGNOSIS — I5022 Chronic systolic (congestive) heart failure: Secondary | ICD-10-CM

## 2020-11-29 DIAGNOSIS — E785 Hyperlipidemia, unspecified: Secondary | ICD-10-CM

## 2020-11-29 DIAGNOSIS — I6522 Occlusion and stenosis of left carotid artery: Secondary | ICD-10-CM | POA: Diagnosis not present

## 2020-11-29 DIAGNOSIS — E0822 Diabetes mellitus due to underlying condition with diabetic chronic kidney disease: Secondary | ICD-10-CM | POA: Diagnosis not present

## 2020-11-29 DIAGNOSIS — E1169 Type 2 diabetes mellitus with other specified complication: Secondary | ICD-10-CM

## 2020-11-29 DIAGNOSIS — I10 Essential (primary) hypertension: Secondary | ICD-10-CM

## 2020-11-30 LAB — COMPREHENSIVE METABOLIC PANEL
ALT: 10 IU/L (ref 0–44)
AST: 6 IU/L (ref 0–40)
Albumin/Globulin Ratio: 1.9 (ref 1.2–2.2)
Albumin: 4 g/dL (ref 3.7–4.7)
Alkaline Phosphatase: 126 IU/L — ABNORMAL HIGH (ref 44–121)
BUN/Creatinine Ratio: 14 (ref 10–24)
BUN: 13 mg/dL (ref 8–27)
Bilirubin Total: 0.3 mg/dL (ref 0.0–1.2)
CO2: 25 mmol/L (ref 20–29)
Calcium: 9.5 mg/dL (ref 8.6–10.2)
Chloride: 103 mmol/L (ref 96–106)
Creatinine, Ser: 0.95 mg/dL (ref 0.76–1.27)
Globulin, Total: 2.1 g/dL (ref 1.5–4.5)
Glucose: 103 mg/dL — ABNORMAL HIGH (ref 65–99)
Potassium: 5 mmol/L (ref 3.5–5.2)
Sodium: 142 mmol/L (ref 134–144)
Total Protein: 6.1 g/dL (ref 6.0–8.5)
eGFR: 83 mL/min/{1.73_m2} (ref >59)

## 2020-11-30 LAB — LIPID PANEL W/O CHOL/HDL RATIO
Cholesterol, Total: 114 mg/dL (ref 100–199)
HDL: 38 mg/dL — ABNORMAL LOW (ref >39)
LDL Chol Calc (NIH): 54 mg/dL (ref 0–99)
Triglycerides: 122 mg/dL (ref 0–149)
VLDL Cholesterol Cal: 22 mg/dL (ref 5–40)

## 2020-11-30 LAB — ANA W/REFLEX IF POSITIVE: Anti Nuclear Antibody (ANA): NEGATIVE

## 2020-11-30 LAB — VITAMIN B12: Vitamin B-12: 331 pg/mL (ref 232–1245)

## 2020-11-30 LAB — SEDIMENTATION RATE: Sed Rate: 3 mm/hr (ref 0–30)

## 2020-11-30 LAB — C-REACTIVE PROTEIN: CRP: 18 mg/L — ABNORMAL HIGH (ref 0–10)

## 2020-11-30 NOTE — Progress Notes (Signed)
Good morning, please let Philip Wood know his labs have returned.  Vitamin B12 remains on low side of normal.  I do recommend he take Vitamin B12 500 to 1000 MCG a day, which he can obtain in vitamin section at many locations.  All inflammatory labs are normal with exception of CRP, which we will recheck next visit.  Any questions? Keep being awesome!!  Thank you for allowing me to participate in your care.  I appreciate you. Kindest regards, Shital Crayton

## 2020-12-07 ENCOUNTER — Telehealth (INDEPENDENT_AMBULATORY_CARE_PROVIDER_SITE_OTHER): Payer: Self-pay

## 2020-12-07 ENCOUNTER — Encounter (INDEPENDENT_AMBULATORY_CARE_PROVIDER_SITE_OTHER): Payer: Self-pay

## 2020-12-07 ENCOUNTER — Other Ambulatory Visit (INDEPENDENT_AMBULATORY_CARE_PROVIDER_SITE_OTHER): Payer: Self-pay | Admitting: Nurse Practitioner

## 2020-12-07 DIAGNOSIS — I6522 Occlusion and stenosis of left carotid artery: Secondary | ICD-10-CM

## 2020-12-07 MED ORDER — CLOPIDOGREL BISULFATE 75 MG PO TABS: 75.0000 mg | ORAL_TABLET | Freq: Every day | ORAL | 11 refills | Status: AC

## 2020-12-07 NOTE — Telephone Encounter (Signed)
I have sent plavix in for the patient

## 2020-12-21 ENCOUNTER — Telehealth (INDEPENDENT_AMBULATORY_CARE_PROVIDER_SITE_OTHER): Payer: Self-pay | Admitting: Vascular Surgery

## 2020-12-21 ENCOUNTER — Telehealth (INDEPENDENT_AMBULATORY_CARE_PROVIDER_SITE_OTHER): Payer: Self-pay

## 2020-12-21 NOTE — Telephone Encounter (Signed)
Call from [place] [facility] called [facility] to inquire about patient.  Forward vm and message sent through chat to [facility] to contact [place] at [phone] ext. [phone]  For documentation purposes

## 2020-12-21 NOTE — Telephone Encounter (Signed)
Patient called to cancel carotid stent placement that was schedule for 9//7/22 with Dr Delana Meyer. Patient will call back to reschedule.

## 2020-12-26 ENCOUNTER — Inpatient Hospital Stay: Admission: RE | Admit: 2020-12-26 | Payer: Medicare Other | Source: Home / Self Care | Admitting: Vascular Surgery

## 2020-12-26 ENCOUNTER — Encounter: Admission: RE | Payer: Self-pay | Source: Home / Self Care

## 2020-12-26 ENCOUNTER — Other Ambulatory Visit (INDEPENDENT_AMBULATORY_CARE_PROVIDER_SITE_OTHER): Payer: Self-pay | Admitting: Nurse Practitioner

## 2020-12-26 DIAGNOSIS — I6522 Occlusion and stenosis of left carotid artery: Secondary | ICD-10-CM

## 2020-12-26 SURGERY — CAROTID PTA/STENT INTERVENTION
Anesthesia: Moderate Sedation | Laterality: Left

## 2021-01-03 ENCOUNTER — Other Ambulatory Visit: Payer: Self-pay

## 2021-01-03 ENCOUNTER — Ambulatory Visit (INDEPENDENT_AMBULATORY_CARE_PROVIDER_SITE_OTHER): Payer: Medicare Other | Admitting: Cardiovascular Disease

## 2021-01-03 ENCOUNTER — Encounter: Payer: Self-pay | Admitting: Cardiovascular Disease

## 2021-01-03 VITALS — BP 140/60 | HR 63 | Ht 69.0 in | Wt 173.5 lb

## 2021-01-03 DIAGNOSIS — I10 Essential (primary) hypertension: Secondary | ICD-10-CM | POA: Diagnosis not present

## 2021-01-03 DIAGNOSIS — R0989 Other specified symptoms and signs involving the circulatory and respiratory systems: Secondary | ICD-10-CM | POA: Diagnosis not present

## 2021-01-03 DIAGNOSIS — I739 Peripheral vascular disease, unspecified: Secondary | ICD-10-CM | POA: Diagnosis not present

## 2021-01-03 DIAGNOSIS — Z720 Tobacco use: Secondary | ICD-10-CM

## 2021-01-03 DIAGNOSIS — I5022 Chronic systolic (congestive) heart failure: Secondary | ICD-10-CM

## 2021-01-03 NOTE — Patient Instructions (Signed)

## 2021-01-03 NOTE — Progress Notes (Signed)
Cardiology Office Note   Date:  01/03/2021   ID:  Philip Wood, DOB 1945/04/24, MRN [id number]  PCP:  Venita Lick, NP  Cardiologist:   Kathlyn Sacramento, MD   Chief Complaint  Patient presents with   12 month follow up     Patient will have a carotid PTA/STENT Intervention with Dr. Ronalee Belts. Patient c/o swelling in feet and ankles. Medications reviewed by the patient verbally.       History of Present Illness: Philip Wood is a 75 y.o. male who is here today for a follow-up visit regarding chronic systolic heart failure.    The patient has prolonged history of tobacco use and lung cancer. He has multiple chronic medical conditions that include type 2 diabetes, hypertension, hyperlipidemia, tobacco use, laryngeal carcinoma status post radiation, COPD and peripheral arterial disease with bilateral leg claudication followed by [facility]. He has remote history of alcohol abuse. He had cardiac work-up in 2019 for preoperative cardiovascular evaluation. Lexiscan Myoview showed small severe defect in the apex most likely artifact with no significant ischemia.  However, his EF was moderately reduced at 30 to 45%.  He underwent an echocardiogram which showed an EF of 45 to 50% with grade 2 diastolic dysfunction, mild to moderate TR and mild to moderate pulmonary hypertension with peak systolic pressure of 44 mmHg.   He has been treated medically and has been doing well.    He was found to have significant left carotid stenosis and was supposed to undergo left carotid artery stent placement but he had to cancel the procedure due to unexpected death of his wife.  Otherwise he has been stable from a cardiac standpoint with no chest pain, shortness of breath or palpitations.  Unfortunately, he is smoking more than before since the death of his wife.  Past Medical History:  Diagnosis Date   Bruit of left carotid artery    Cancer ([place])    laryngeal   Chronic heart  failure (HCC)    Chronic kidney disease    COPD (chronic obstructive pulmonary disease) (HCC)    Coronary artery disease    Diabetes mellitus without complication (HCC)    GERD (gastroesophageal reflux disease)    Headache    History of kidney stones    Hyperlipidemia    Hypertension    Lumbago    Neuropathy    Tobacco abuse disorder     Past Surgical History:  Procedure Laterality Date   ARTERY BIOPSY Right 11/14/2020   Procedure: BIOPSY TEMPORAL ARTERY;  Surgeon: Katha Cabal, MD;  Location: [facility] OR[facility];  Service: Vascular;  Laterality: Right;   CHOLECYSTECTOMY     ELECTROMAGNETIC NAVIGATION BROCHOSCOPY N/A 08/19/2017   Procedure: ELECTROMAGNETIC NAVIGATION BRONCHOSCOPY;  Surgeon: Flora Lipps, MD;  Location: [facility];  Service: Cardiopulmonary;  Laterality: N/A;   ELECTROMAGNETIC NAVIGATION BROCHOSCOPY Left 09/02/2017   Procedure: ELECTROMAGNETIC NAVIGATION BRONCHOSCOPY;  Surgeon: Flora Lipps, MD;  Location: [facility];  Service: Cardiopulmonary;  Laterality: Left;   THROAT SURGERY       Current Outpatient Medications  Medication Sig Dispense Refill   acetaminophen (TYLENOL) 500 MG tablet Take 500-1,000 mg by mouth every 6 (six) hours as needed (for pain).     allopurinol (ZYLOPRIM) 100 MG tablet Take 1 tablet (100 mg total) by mouth daily. (Patient taking differently: Take 100 mg by mouth in the morning.) 90 tablet 4   aspirin EC 81 MG tablet Take 81 mg by mouth in the morning.  Swallow whole.     atorvastatin (LIPITOR) 80 MG tablet Take 1 tablet (80 mg total) by mouth daily at 6 PM. 90 tablet 4   clopidogrel (PLAVIX) 75 MG tablet Take 1 tablet (75 mg total) by mouth daily. 30 tablet 11   metFORMIN (GLUCOPHAGE) 500 MG tablet TAKE 1 TABLET BY MOUTH TWICE DAILY WITH A MEAL 180 tablet 0   metoprolol succinate (TOPROL XL) 25 MG 24 hr tablet Take 1 tablet (25 mg total) by mouth daily. (Patient taking differently: Take 25 mg by mouth in the morning.) 90 tablet 4   MODERNA COVID-19  VACCINE 100 MCG/0.5ML injection      ramipril (ALTACE) 10 MG capsule Take 1 capsule (10 mg total) by mouth daily. (Patient taking differently: Take 10 mg by mouth in the morning.) 90 capsule 4   tiotropium (SPIRIVA HANDIHALER) 18 MCG inhalation capsule PLACE 1 CAPSULE INTO INHALER AND INHALE ITS CONTENTS ONCE DAILY. (Patient taking differently: Place 18 mcg into inhaler and inhale in the morning.) 90 capsule 4   vitamin B-12 (CYANOCOBALAMIN) 1000 MCG tablet Take 1,000 mcg by mouth daily at 12 noon.     tiZANidine (ZANAFLEX) 4 MG tablet Take 1 tablet (4 mg total) by mouth every 6 (six) hours as needed for muscle spasms. (Patient not taking: Reported on 01/03/2021) 30 tablet 0   No current facility-administered medications for this visit.    Allergies:   Patient has no known allergies.    Social History:  The patient  reports that he has been smoking cigarettes. He has a 31.80 pack-year smoking history. He has never used smokeless tobacco. He reports that he does not drink alcohol and does not use drugs.   Family History:  The patient's family history includes Brain cancer in his sister; Cervical cancer in his sister; Diabetes in his sister; Heart attack in his father; Heart disease in his father; Lung cancer in his brother, brother, brother, and brother; Lupus in his sister; Rectal cancer in his brother; Stomach cancer in his sister and sister.    ROS:  Please see the history of present illness.   Otherwise, review of systems are positive for none.   All other systems are reviewed and negative.    PHYSICAL EXAM: VS:  BP 140/60 (BP Location: Left Arm, Patient Position: Sitting, Cuff Size: Normal)   Pulse 63   Ht 5\' 9"  (1.753 m)   Wt 173 lb 8 oz (78.7 kg)   SpO2 96%   BMI 25.62 kg/m  , BMI Body mass index is 25.62 kg/m. GEN: Well nourished, well developed, in no acute distress  HEENT: normal  Neck: no JVD,  or masses.  Left carotid bruit Cardiac: RRR; no murmurs, rubs, or gallops,no edema   Respiratory:  clear to auscultation bilaterally, normal work of breathing GI: soft, nontender, nondistended, + BS MS: no deformity or atrophy  Skin: warm and dry, no rash Neuro:  Strength and sensation are intact Psych: euthymic mood, full affect   EKG:  EKG is ordered today. The ekg ordered today demonstrates normal sinus rhythm with a PVC and nonspecific ST changes.   Recent Labs: 05/31/2020: TSH 2.890 11/13/2020: Platelets 177 11/14/2020: Hemoglobin 14.6 11/28/2020: ALT 10; BUN 13; Creatinine, Ser 0.95; Potassium 5.0; Sodium 142    Lipid Panel    Component Value Date/Time   CHOL 114 11/28/2020 0904   CHOL 149 08/12/2018 0838   TRIG 122 11/28/2020 0904   TRIG 111 08/12/2018 0838   HDL 38 (L) 11/28/2020  0904   CHOLHDL 4.3 05/31/2020 0904   VLDL 22 08/12/2018 0838   LDLCALC 54 11/28/2020 0904      Wt Readings from Last 3 Encounters:  01/03/21 173 lb 8 oz (78.7 kg)  11/29/20 177 lb 9.6 oz (80.6 kg)  11/28/20 177 lb (80.3 kg)      PAD Screen 08/21/2017  Previous PAD dx? No  Previous surgical procedure? Yes  Pain with walking? No  Feet/toe relief with dangling? No  Painful, non-healing ulcers? No  Extremities discolored? No      ASSESSMENT AND PLAN:  1.  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  Continue treatment with Toprol and ramipril.  No evidence of volume overload  2.  Significant left carotid stenosis with a bruit: The patient is supposed to get revascularization in the near future by vascular surgery.  3.  Peripheral arterial disease with bilateral leg claudication: Followed by [facility].  4.  Hyperlipidemia: I reviewed his most recent lipid profile done in August which showed an LDL of 54 and triglyceride of 122.  Both at target.  Based on this, I recommend continuing same dose of atorvastatin 80 mg daily.  5.  Essential hypertension: Blood pressure is controlled on current  medications.  6.  Tobacco use: I again discussed with him the importance of smoking cessation.   Disposition:   FU with me in 12 months  Signed,  Kathlyn Sacramento, MD  01/03/2021 8:52 AM    [place]

## 2021-01-08 ENCOUNTER — Ambulatory Visit (INDEPENDENT_AMBULATORY_CARE_PROVIDER_SITE_OTHER): Payer: Medicare Other | Admitting: Nurse Practitioner

## 2021-01-08 ENCOUNTER — Encounter: Payer: Self-pay | Admitting: Nurse Practitioner

## 2021-01-08 ENCOUNTER — Other Ambulatory Visit: Payer: Self-pay

## 2021-01-08 ENCOUNTER — Telehealth (INDEPENDENT_AMBULATORY_CARE_PROVIDER_SITE_OTHER): Payer: Self-pay | Admitting: Vascular Surgery

## 2021-01-08 VITALS — BP 126/77 | HR 75 | Temp 98.9°F | Wt 171.2 lb

## 2021-01-08 DIAGNOSIS — C3490 Malignant neoplasm of unspecified part of unspecified bronchus or lung: Secondary | ICD-10-CM

## 2021-01-08 DIAGNOSIS — I130 Hypertensive heart and chronic kidney disease with heart failure and stage 1 through stage 4 chronic kidney disease, or unspecified chronic kidney disease: Secondary | ICD-10-CM | POA: Diagnosis not present

## 2021-01-08 DIAGNOSIS — Z23 Encounter for immunization: Secondary | ICD-10-CM

## 2021-01-08 DIAGNOSIS — J432 Centrilobular emphysema: Secondary | ICD-10-CM | POA: Diagnosis not present

## 2021-01-08 DIAGNOSIS — F17219 Nicotine dependence, cigarettes, with unspecified nicotine-induced disorders: Secondary | ICD-10-CM

## 2021-01-08 DIAGNOSIS — F4321 Adjustment disorder with depressed mood: Secondary | ICD-10-CM | POA: Insufficient documentation

## 2021-01-08 DIAGNOSIS — I700 Atherosclerosis of aorta: Secondary | ICD-10-CM

## 2021-01-08 DIAGNOSIS — I5022 Chronic systolic (congestive) heart failure: Secondary | ICD-10-CM

## 2021-01-08 DIAGNOSIS — N1831 Chronic kidney disease, stage 3a: Secondary | ICD-10-CM | POA: Diagnosis not present

## 2021-01-08 DIAGNOSIS — E1169 Type 2 diabetes mellitus with other specified complication: Secondary | ICD-10-CM | POA: Diagnosis not present

## 2021-01-08 DIAGNOSIS — E785 Hyperlipidemia, unspecified: Secondary | ICD-10-CM

## 2021-01-08 DIAGNOSIS — I6522 Occlusion and stenosis of left carotid artery: Secondary | ICD-10-CM | POA: Diagnosis not present

## 2021-01-08 DIAGNOSIS — N183 Chronic kidney disease, stage 3 unspecified: Secondary | ICD-10-CM

## 2021-01-08 DIAGNOSIS — E0822 Diabetes mellitus due to underlying condition with diabetic chronic kidney disease: Secondary | ICD-10-CM | POA: Diagnosis not present

## 2021-01-08 LAB — BAYER DCA HB A1C WAIVED: HB A1C (BAYER DCA - WAIVED): 5.6 % (ref 4.8–5.6)

## 2021-01-08 MED ORDER — PREDNISONE 20 MG PO TABS: 40.0000 mg | ORAL_TABLET | Freq: Every day | ORAL | 0 refills | Status: AC

## 2021-01-08 MED ORDER — METFORMIN HCL 500 MG PO TABS: 500.0000 mg | ORAL_TABLET | Freq: Two times a day (BID) | ORAL | 4 refills | Status: AC

## 2021-01-08 MED ORDER — MIRTAZAPINE 7.5 MG PO TABS: 7.5000 mg | ORAL_TABLET | Freq: Every day | ORAL | 4 refills | Status: AC

## 2021-01-08 NOTE — Assessment & Plan Note (Signed)
Continue collaboration with oncology.  Recommend complete cessation of smoking.

## 2021-01-08 NOTE — Assessment & Plan Note (Signed)
Chronic, ongoing with BP at goal today.  Home BP at goal per patient.  Continue current medication regimen and collaboration with cardiology.  CMP up to date.  Recommend focus on DASH diet at home and continue to monitor BP and weight closely. Refills sent in as needed.  Return in 6 months.

## 2021-01-08 NOTE — Assessment & Plan Note (Signed)
Ongoing, continue collaboration with vascular.  Appreciate their input.

## 2021-01-08 NOTE — Assessment & Plan Note (Signed)
I have recommended complete cessation of tobacco use. I have discussed various options available for assistance with tobacco cessation including over the counter methods (Nicotine gum, patch and lozenges). We also discussed prescription options (Chantix, Nicotine Inhaler / Nasal Spray). The patient is not interested in pursuing any prescription tobacco cessation options at this time.  

## 2021-01-08 NOTE — Assessment & Plan Note (Signed)
Noted on past CT imaging of lungs.  Recommend to continue statin and ASA daily for prevention.  Recommend complete cessation of smoking.

## 2021-01-08 NOTE — Assessment & Plan Note (Signed)
Chronic, ongoing.  A1C 5.8% today, remaining very stable.  Urine ALB 80 and A:C 30-300 in February 2022, continue Ramipril for kidney protection.  Continue current medication regimen and adjust as needed.  Recommend he check BS at home at least a few mornings a week.  Consider reduction of medication next visit, if remains stable.  CMP up to date.  Return in 6 months.

## 2021-01-08 NOTE — Assessment & Plan Note (Signed)
Recently has been improved on labs since November 2020.  Continue to monitor closely for decline.  Continue Ramipril for protection.

## 2021-01-08 NOTE — Progress Notes (Signed)
BP 126/77   Pulse 75   Temp 98.9 F (37.2 C) (Oral)   Wt 171 lb 3.2 oz (77.7 kg)   SpO2 97%   BMI 25.28 kg/m    Subjective:    Patient ID: Philip Wood, male    DOB: 1945/05/20, 75 y.o.   MRN: [id number]  HPI: Philip Wood is a 75 y.o. male  Chief Complaint  Patient presents with   COPD   Diabetes    Patient states he thinks his sugar readings are fine.    Hyperlipidemia   Hypertension    Patient states he went to see his Cardiologist and states his visit went well.   Lung Cancer   Anorexia    Patient states his appetite has not been the same since the passing of his wife. Patient states when he eats he has to force it down and once he eats, he eats small amounts and then he states he feels full.    Cough    Patient states he thinks he may have developed a chest cough. Patient states that it feels that way when he coughs and states it just started the other day. Patient states he has tried Tylenol.    DIABETES Continues on Metformin 500 MG BID.  Last A1C was 5.9% last check. Hypoglycemic episodes:no Polydipsia/polyuria: no Visual disturbance: no Chest pain: no Paresthesias: no Glucose Monitoring: no  Accucheck frequency: Not Checking  Fasting glucose:  Post prandial:  Evening:  Before meals: Taking Insulin?: no  Long acting insulin:  Short acting insulin: Blood Pressure Monitoring: a few times a week Retinal Examination: Up To Date Foot Exam: Up to Date Pneumovax: Up to Date Influenza: Up to Date Aspirin: yes   HYPERTENSION / HYPERLIPIDEMIA/HF Continues Ramipril, Metoprolol, and Atorvastatin + ASA.  Is followed by cardiology and last saw 01/03/21 -- no changes made.  Last echo EF 45-50% in 2019. States weight at home is staying around 170 lbs.  Is currently being followed by vascular for carotid stenosis and is to have revascularization in future, recently cancelled due to loss of his wife.  Last visit with them was 11/29/20.   Satisfied with current  treatment? yes Duration of hypertension: chronic BP monitoring frequency: not checking BP range:  BP medication side effects: no Duration of hyperlipidemia: chronic Cholesterol medication side effects: no Cholesterol supplements: none Medication compliance: good compliance Aspirin: yes Recent stressors: no Recurrent headaches: no Visual changes: no Palpitations: no Dyspnea: baseline, no worse Chest pain: no Lower extremity edema: no Dizzy/lightheaded: no   LUNG CANCER (Right upper lobe nodule ): Followed by oncology and received radiation treatment, no further treatments at this time.  Last saw Dr. Baruch Gouty 08/01/20. Continues on Spiriva daily.  Smokes  about 1 PPD at this time, increased with loss of wife but is cutting back now, he tried to quit but was unsuccessful.  Is not interested in quitting at this time.  Last followed by pulmonary in 2019, Dr. Mortimer Fries, for COPD.  Last CT scan 07/30/20 emphysema and aortic atherosclerosis.    Does endorse recent increased cough over past 2 days.  Denies any increased SOB with this.  Is productive.  Denies fever, congestion, rhinorrhea, sore throat, headache.  Has noticed some increased wheezing.   COPD status: stable Satisfied with current treatment?: yes Oxygen use: no Dyspnea frequency: occasional Cough frequency: occasional Rescue inhaler frequency: does not have Limitation of activity: no Productive cough: none Last Spirometry: 2019 Pneumovax: Up to Date Influenza: Up  to Date  DECREASED APPETITE/GRIEVING Recent loss of his wife > 1 week ago.  Reports overall mood doing well, but has had decreased appetite.  When sitting at table eating alone, loses appetite.  Has a very strong family and church support. Previous psychiatric medications:  Depressed mood: no Anxious mood: no Anhedonia: no Significant weight loss or gain: yes Insomnia: yes hard to stay asleep Fatigue: no Feelings of worthlessness or guilt: no Impaired  concentration/indecisiveness: no Suicidal ideations: no Hopelessness: no Crying spells: yes Depression screen [facility] 2/9 01/08/2021 08/31/2020 05/31/2020 11/29/2019 09/20/2018  Decreased Interest 0 0 0 0 0  Down, Depressed, Hopeless 0 0 0 0 0  PHQ - 2 Score 0 0 0 0 0  Altered sleeping 0 - 0 - -  Tired, decreased energy 0 - 0 - -  Change in appetite 2 - 0 - -  Feeling bad or failure about yourself  0 - 0 - -  Trouble concentrating 0 - 0 - -  Moving slowly or fidgety/restless 0 - 0 - -  Suicidal thoughts 0 - 0 - -  PHQ-9 Score 2 - 0 - -  Difficult doing work/chores Somewhat difficult - - - -     Relevant past medical, surgical, family and social history reviewed and updated as indicated. Interim medical history since our last visit reviewed. Allergies and medications reviewed and updated.  Review of Systems  Constitutional:  Negative for activity change, diaphoresis, fatigue and fever.  Respiratory:  Positive for cough. Negative for chest tightness, shortness of breath and wheezing.   Cardiovascular:  Negative for chest pain, palpitations and leg swelling.  Gastrointestinal: Negative.   Endocrine: Negative for cold intolerance, heat intolerance, polydipsia, polyphagia and polyuria.  Neurological: Negative.   Psychiatric/Behavioral: Negative.     Per HPI unless specifically indicated above     Objective:    BP 126/77   Pulse 75   Temp 98.9 F (37.2 C) (Oral)   Wt 171 lb 3.2 oz (77.7 kg)   SpO2 97%   BMI 25.28 kg/m   Wt Readings from Last 3 Encounters:  01/08/21 171 lb 3.2 oz (77.7 kg)  01/03/21 173 lb 8 oz (78.7 kg)  11/29/20 177 lb 9.6 oz (80.6 kg)    Physical Exam Vitals and nursing note reviewed.  Constitutional:      General: He is awake. He is not in acute distress.    Appearance: He is well-developed and well-groomed. He is not ill-appearing or toxic-appearing.  HENT:     Head: Normocephalic and atraumatic.     Right Ear: Hearing, tympanic membrane, ear canal and  external ear normal. No drainage.     Left Ear: Hearing, tympanic membrane, ear canal and external ear normal. No drainage.     Nose:     Right Sinus: No maxillary sinus tenderness or frontal sinus tenderness.     Left Sinus: No maxillary sinus tenderness or frontal sinus tenderness.  Eyes:     General: Lids are normal.        Right eye: No discharge.        Left eye: No discharge.     Extraocular Movements: Extraocular movements intact.     Conjunctiva/sclera: Conjunctivae normal.     Pupils: Pupils are equal, round, and reactive to light.     Visual Fields: Right eye visual fields normal and left eye visual fields normal.  Neck:     Thyroid: No thyromegaly.     Vascular: Carotid bruit (left side  noted) present.  Cardiovascular:     Rate and Rhythm: Normal rate and regular rhythm.     Heart sounds: Normal heart sounds, S1 normal and S2 normal. No murmur heard.   No gallop.  Pulmonary:     Effort: Pulmonary effort is normal. No accessory muscle usage or respiratory distress.     Breath sounds: Decreased breath sounds and wheezing present.     Comments: Decreased sounds throughout with expiratory wheezes per baseline. Abdominal:     General: Bowel sounds are normal.     Palpations: Abdomen is soft.  Musculoskeletal:        General: Normal range of motion.     Cervical back: Normal range of motion and neck supple.     Right lower leg: No edema.     Left lower leg: No edema.  Lymphadenopathy:     Cervical: No cervical adenopathy.  Skin:    General: Skin is warm and dry.  Neurological:     Mental Status: He is alert and oriented to person, place, and time.     Cranial Nerves: Cranial nerves are intact.     Motor: Motor function is intact.     Coordination: Coordination is intact.     Gait: Gait is intact.     Deep Tendon Reflexes: Reflexes are normal and symmetric.     Reflex Scores:      Brachioradialis reflexes are 2+ on the right side and 2+ on the left side.      Patellar  reflexes are 2+ on the right side and 2+ on the left side. Psychiatric:        Attention and Perception: Attention normal.        Mood and Affect: Mood normal.        Speech: Speech normal.        Behavior: Behavior normal. Behavior is cooperative.        Thought Content: Thought content normal.   Results for orders placed or performed in visit on 11/28/20  Vitamin B12  Result Value Ref Range   Vitamin B-12 331 232 - 1,245 pg/mL  C-reactive protein  Result Value Ref Range   CRP 18 (H) 0 - 10 mg/L  Sed Rate (ESR)  Result Value Ref Range   Sed Rate 3 0 - 30 mm/hr  ANA w/Reflex if Positive  Result Value Ref Range   Anti Nuclear Antibody (ANA) Negative Negative  Lipid Panel w/o Chol/HDL Ratio  Result Value Ref Range   Cholesterol, Total 114 100 - 199 mg/dL   Triglycerides 122 0 - 149 mg/dL   HDL 38 (L) >39 mg/dL   VLDL Cholesterol Cal 22 5 - 40 mg/dL   LDL Chol Calc (NIH) 54 0 - 99 mg/dL  Comprehensive metabolic panel  Result Value Ref Range   Glucose 103 (H) 65 - 99 mg/dL   BUN 13 8 - 27 mg/dL   Creatinine, Ser 0.95 0.76 - 1.27 mg/dL   eGFR 83 >59 mL/min/1.73   BUN/Creatinine Ratio 14 10 - 24   Sodium 142 134 - 144 mmol/L   Potassium 5.0 3.5 - 5.2 mmol/L   Chloride 103 96 - 106 mmol/L   CO2 25 20 - 29 mmol/L   Calcium 9.5 8.6 - 10.2 mg/dL   Total Protein 6.1 6.0 - 8.5 g/dL   Albumin 4.0 3.7 - 4.7 g/dL   Globulin, Total 2.1 1.5 - 4.5 g/dL   Albumin/Globulin Ratio 1.9 1.2 - 2.2   Bilirubin Total 0.3  0.0 - 1.2 mg/dL   Alkaline Phosphatase 126 (H) 44 - 121 IU/L   AST 6 0 - 40 IU/L   ALT 10 0 - 44 IU/L      Assessment & Plan:   Problem List Items Addressed This Visit       Cardiovascular and Mediastinum   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  Home BP at goal per patient.  Continue current medication regimen and collaboration with cardiology.  CMP up to date.  Recommend focus on DASH diet  at home and continue to monitor BP and weight closely. Refills sent in as needed.  Return in 6 months.      Chronic systolic heart failure (HCC)    Chronic, ongoing.  Euvolemic today. . Continue current medication regimen and collaboration with cardiology.  CMP up to date.  Recommend: - Reminded to call for an overnight weight gain of >2 pounds or a weekly weight weight of >5 pounds - not adding salt to his food and has been reading food labels. Reviewed the importance of keeping daily sodium intake to <2075m daily       [place]ortic atherosclerosis [place]    Noted on past CT imaging of lungs.  Recommend to continue statin and ASA daily for prevention.  Recommend complete cessation of smoking.      Carotid stenosis    Ongoing, continue collaboration with vascular.  Appreciate their input.        Respiratory   Centrilobular emphysema (HCC)    Chronic, ongoing with current increased cough due to increased smoking with loss of wife.  Continue current medication regimen and adjust as needed.  Prednisone 40 MG daily for 5 days sent in to help and recommend cut back on smoking.  Recommend complete smoking cessation.  Consider addition of Albuterol at next visit as rescue inhaler, he refuses this being sent in today.  At this time he denies symptoms.  Plan on spirometry next visit, discussed with patient.      Relevant Medications   predniSONE (DELTASONE) 20 MG tablet   Lung cancer ([place]    Continue collaboration with oncology.  Recommend complete cessation of smoking.      Relevant Medications   predniSONE (DELTASONE) 20 MG tablet     Endocrine   Diabetes mellitus with chronic kidney disease ([place] [facility]    Chronic, ongoing.  A1C 5.8% today, remaining very stable.  Urine ALB 80 and A:C 30-300 in February 2022, continue Ramipril for kidney protection.  Continue current medication regimen and adjust as needed.  Recommend he check BS at home at least a few mornings a week.  Consider reduction  of medication next visit, if remains stable.  CMP up to date.  Return in 6 months.      Relevant Medications   metFORMIN (GLUCOPHAGE) 500 MG tablet   Other Relevant Orders   Bayer DCA Hb A1c Waived   Hyperlipidemia associated with type 2 diabetes mellitus (HCC)    Chronic, ongoing.  Continue current medication regimen and adjust as needed.  Lipid panel up to date.      Relevant Medications   metFORMIN (GLUCOPHAGE) 500 MG tablet   Other Relevant Orders   Bayer DCA Hb A1c Waived     Nervous and Auditory   Nicotine dependence, cigarettes, w unsp disorders    I have recommended complete cessation of tobacco use. I have discussed  various options available for assistance with tobacco cessation including over the counter methods (Nicotine gum, patch and lozenges). We also discussed prescription options (Chantix, Nicotine Inhaler / Nasal Spray). The patient is not interested in pursuing any prescription tobacco cessation options at this time.         Genitourinary   CKD (chronic kidney disease), stage III ([place])    Recently has been improved on labs since November 2020.  Continue to monitor closely for decline.  Continue Ramipril for protection.        Other   Grieving    Recent loss of wife over past 2 weeks, at this time is losing weight (total of 6 pounds since August) and endorses decline in appetite with loss of wife.  Will start Mirtazapine 7.5 MG nightly, educated him on this medication and use for mood and appetite.  Plan to have return in 4 weeks for follow-up, recommend he reach out to hospice for support group.  If ongoing loss consider imaging and labs.      Other Visit Diagnoses     Flu vaccine need       Flu vaccine today   Relevant Orders   Flu Vaccine QUAD High Dose(Fluad) (Completed)        Follow up plan: Return in about 4 weeks (around 02/05/2021) for Grieving.

## 2021-01-08 NOTE — Assessment & Plan Note (Signed)
Recent loss of wife over past 2 weeks, at this time is losing weight (total of 6 pounds since August) and endorses decline in appetite with loss of wife.  Will start Mirtazapine 7.5 MG nightly, educated him on this medication and use for mood and appetite.  Plan to have return in 4 weeks for follow-up, recommend he reach out to hospice for support group.  If ongoing loss consider imaging and labs.

## 2021-01-08 NOTE — Assessment & Plan Note (Addendum)
Chronic, ongoing.  Continue current medication regimen and adjust as needed.  Lipid panel up to date.

## 2021-01-08 NOTE — Assessment & Plan Note (Signed)
Chronic, ongoing.  Euvolemic today. . Continue current medication regimen and collaboration with cardiology.  CMP up to date.  Recommend: - Reminded to call for an overnight weight gain of >2 pounds or a weekly weight weight of >5 pounds - not adding salt to his food and has been reading food labels. Reviewed the importance of keeping daily sodium intake to 2000mg  daily

## 2021-01-08 NOTE — Assessment & Plan Note (Signed)
Chronic, ongoing with current increased cough due to increased smoking with loss of wife.  Continue current medication regimen and adjust as needed.  Prednisone 40 MG daily for 5 days sent in to help and recommend cut back on smoking.  Recommend complete smoking cessation.  Consider addition of Albuterol at next visit as rescue inhaler, he refuses this being sent in today.  At this time he denies symptoms.  Plan on spirometry next visit, discussed with patient.

## 2021-01-08 NOTE — Patient Instructions (Signed)
Managing Loss, Adult People experience loss in many different ways throughout their lives. Events such as moving, changing jobs, and losing friends can create a sense of loss. The loss may be as serious as a major health change, divorce, death of a pet, or death of a loved one. All of these types of loss are likely to create a physical and emotional reaction known as grief. Grief is the result of a major change or an absence of something or someone that you count on. Grief is a normal reaction to loss. A variety of factors can affect your grieving experience, including: The nature of your loss. Your relationship to what or whom you lost. Your understanding of grief and how to manage it. Your support system. How to manage lifestyle changes Keep to your normal routine as much as possible. If you have trouble focusing or doing normal activities, it is acceptable to take some time away from your normal routine. Spend time with friends and loved ones. Eat a healthy diet, get plenty of sleep, and rest when you feel tired. How to recognize changes  The way that you deal with your grief will affect your ability to function as you normally do. When grieving, you may experience these changes: Numbness, shock, sadness, anxiety, anger, denial, and guilt. Thoughts about death. Unexpected crying. A physical sensation of emptiness in your stomach. Problems sleeping and eating. Tiredness (fatigue). Loss of interest in normal activities. Dreaming about or imagining seeing the person who died. A need to remember what or whom you lost. Difficulty thinking about anything other than your loss for a period of time. Relief. If you have been expecting the loss for a while, you may feel a sense of relief when it happens. Follow these instructions at home: Activity Express your feelings in healthy ways, such as: Talking with others about your loss. It may be helpful to find others who have had a similar loss, such  as a support group. Writing down your feelings in a journal. Doing physical activities to release stress and emotional energy. Doing creative activities like painting, sculpting, or playing or listening to music. Practicing resilience. This is the ability to recover and adjust after facing challenges. Reading some resources that encourage resilience may help you to learn ways to practice those behaviors.  General instructions Be patient with yourself and others. Allow the grieving process to happen, and remember that grieving takes time. It is likely that you may never feel completely done with some grief. You may find a way to move on while still cherishing memories and feelings about your loss. Accepting your loss is a process. It can take months or longer to adjust. Keep all follow-up visits as told by your health care provider. This is important. Where to find support To get support for managing loss: Ask your health care provider for help and recommendations, such as grief counseling or therapy. Think about joining a support group for people who are managing a loss. Where to find more information You can find more information about managing loss from: American Society of Clinical Oncology: www.cancer.net American Psychological Association: www.apa.org Contact a health care provider if: Your grief is extreme and keeps getting worse. You have ongoing grief that does not improve. Your body shows symptoms of grief, such as illness. You feel depressed, anxious, or lonely. Get help right away if: You have thoughts about hurting yourself or others. If you ever feel like you may hurt yourself or others, or have   thoughts about taking your own life, get help right away. You can go to your nearest emergency department or call: Your local emergency services (911 in the [place] A suicide crisis helpline, such as the National Suicide Prevention Lifeline at 1-800-273-8255. This is open 24 hours a  day. Summary Grief is the result of a major change or an absence of someone or something that you count on. Grief is a normal reaction to loss. The depth of grief and the period of recovery depend on the type of loss and your ability to adjust to the change and process your feelings. Processing grief requires patience and a willingness to accept your feelings and talk about your loss with people who are supportive. It is important to find resources that work for you and to realize that people experience grief differently. There is not one grieving process that works for everyone in the same way. Be aware that when grief becomes extreme, it can lead to more severe issues like isolation, depression, anxiety, or suicidal thoughts. Talk with your health care provider if you have any of these issues. This information is not intended to replace advice given to you by your health care provider. Make sure you discuss any questions you have with your health care provider. Document Revised: 09/29/2019 Document Reviewed: 09/29/2019 Elsevier Patient Education  2022 [employer].  

## 2021-01-09 ENCOUNTER — Encounter (INDEPENDENT_AMBULATORY_CARE_PROVIDER_SITE_OTHER): Payer: Self-pay

## 2021-01-09 NOTE — Telephone Encounter (Signed)
Patient left carotid stent is reschedule for 01/30/21 and Covid-19 test 01/28/21. Pre-procedure instructions were gone over and will be mailed out

## 2021-01-24 ENCOUNTER — Other Ambulatory Visit: Payer: Self-pay

## 2021-01-24 ENCOUNTER — Ambulatory Visit
Admission: RE | Admit: 2021-01-24 | Discharge: 2021-01-24 | Disposition: A | Discharge status: Home / Self Care | Payer: Medicare Other | Source: Ambulatory Visit | Attending: Radiation Oncology | Admitting: Radiation Oncology

## 2021-01-24 DIAGNOSIS — C3490 Malignant neoplasm of unspecified part of unspecified bronchus or lung: Secondary | ICD-10-CM | POA: Diagnosis not present

## 2021-01-24 DIAGNOSIS — I700 Atherosclerosis of aorta: Secondary | ICD-10-CM | POA: Diagnosis not present

## 2021-01-24 DIAGNOSIS — J439 Emphysema, unspecified: Secondary | ICD-10-CM | POA: Diagnosis not present

## 2021-01-24 DIAGNOSIS — R918 Other nonspecific abnormal finding of lung field: Secondary | ICD-10-CM | POA: Diagnosis not present

## 2021-01-24 LAB — POCT I-STAT CREATININE: Creatinine, Ser: 1.1 mg/dL (ref 0.61–1.24)

## 2021-01-24 IMAGING — CT CT CHEST W/ CM
2 of 4 series · 11 of 36 positions shown, 13 images · IV contrast (omnipaque)
Comparison: CT of the chest [DATE].
COMPARISON: CT of the chest [DATE].

Addendum:
CLINICAL DATA: 75-year-old male with history of non-small cell lung
cancer treated with radiation therapy presenting with 1 week of
hemoptysis and cough. Follow-up study.

EXAM:
CT CHEST WITH CONTRAST
TECHNIQUE: Multidetector CT imaging of the chest was performed during
intravenous contrast administration.
CONTRAST:  75mL OMNIPAQUE IOHEXOL 350 MG/ML SOLN

[Series 2: axial chest 2.00 · axial · 0.67mm/px · z∈[-1226,-898]mm · 8 of 194 slices shown, 10 images]
[im 15/194  mediastinal]
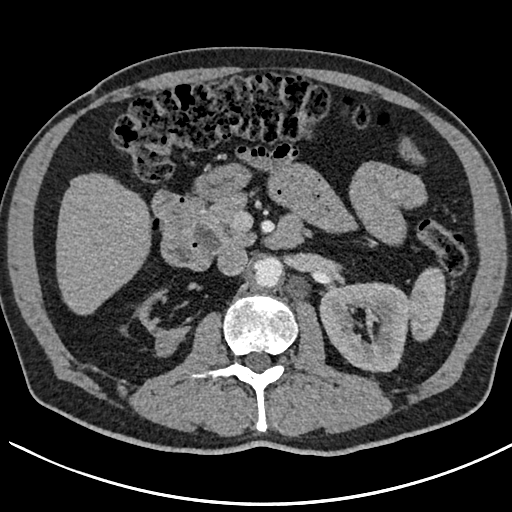
[im 15/194  lung]
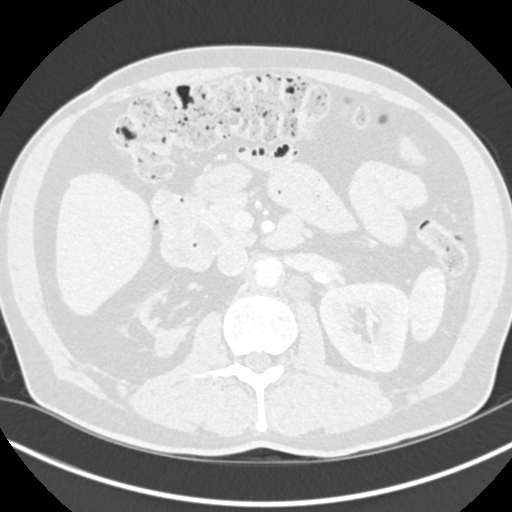
[im 45/194  lung]
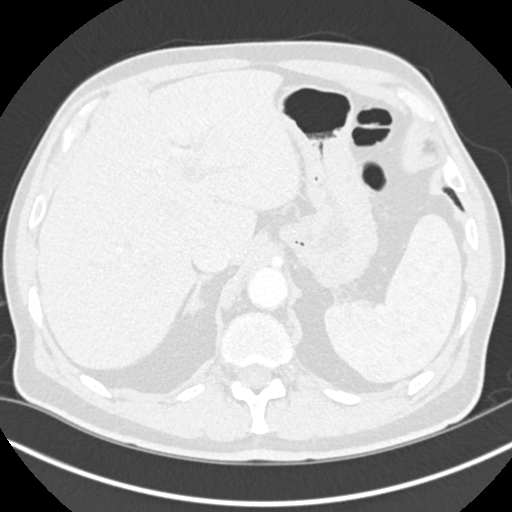
[im 60/194  lung]
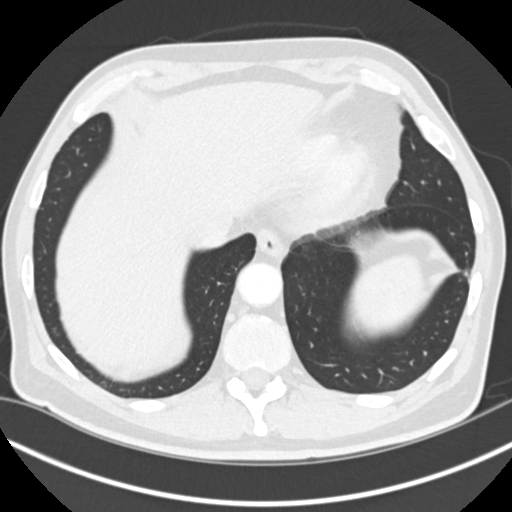
[im 90/194  lung]
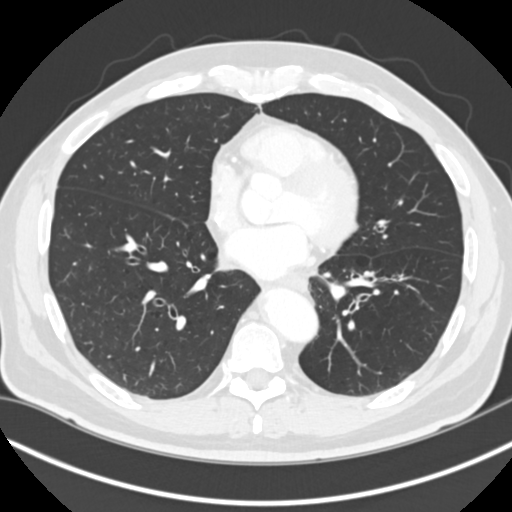
[im 104/194  mediastinal]
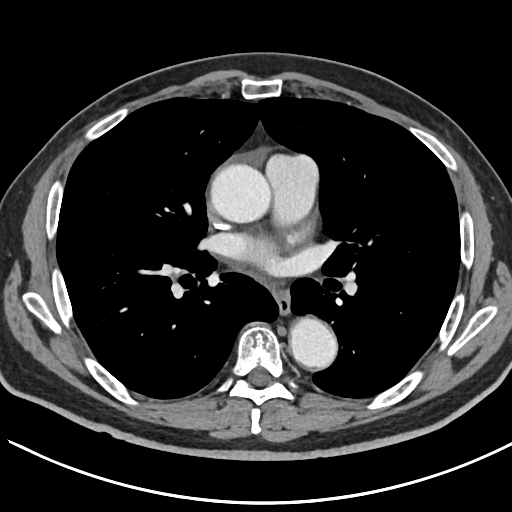
[im 104/194  lung]
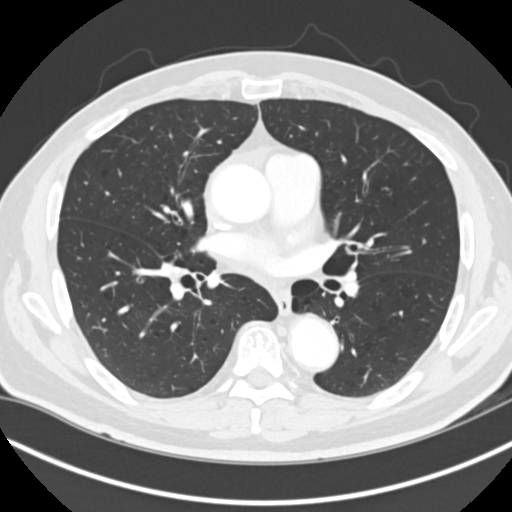
[im 134/194  lung]
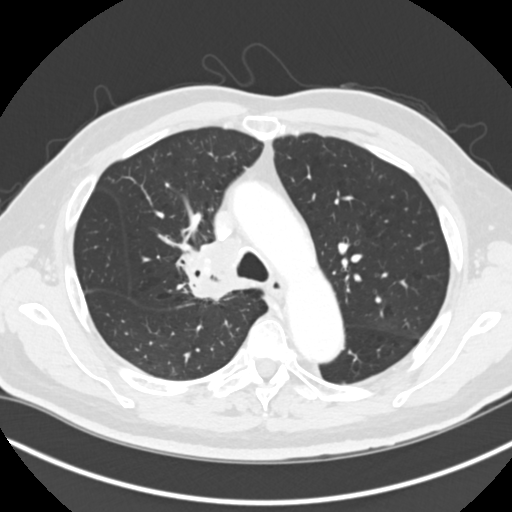
[im 149/194  lung]
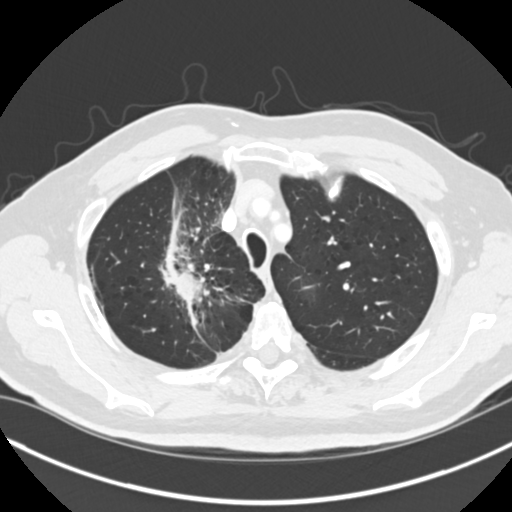
[im 179/194  lung]
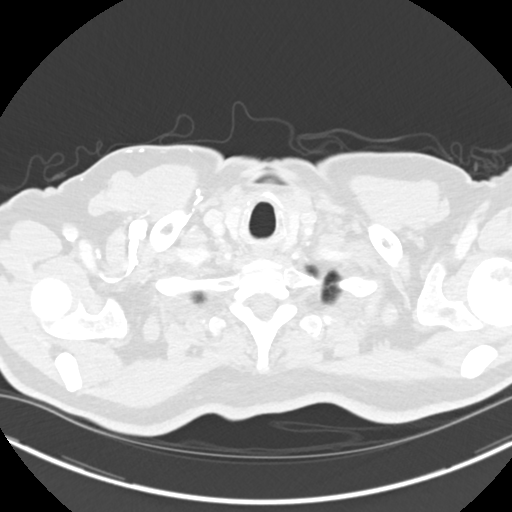

[Series 4: coronal chest 2.00 cor · coronal · 0.67mm/px · 3 of 145 slices shown]
[im 29/145  lung]
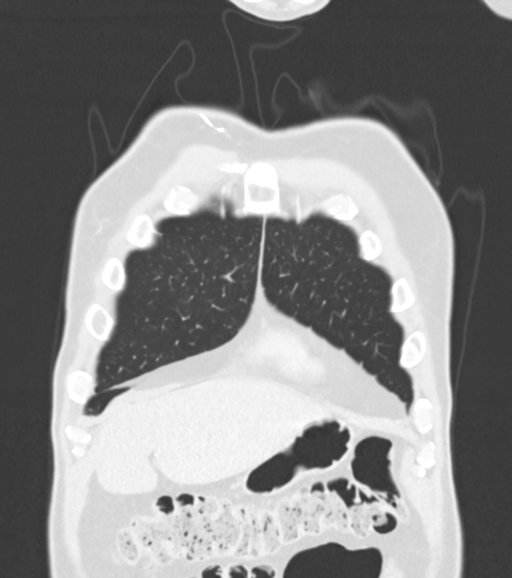
[im 58/145  lung]
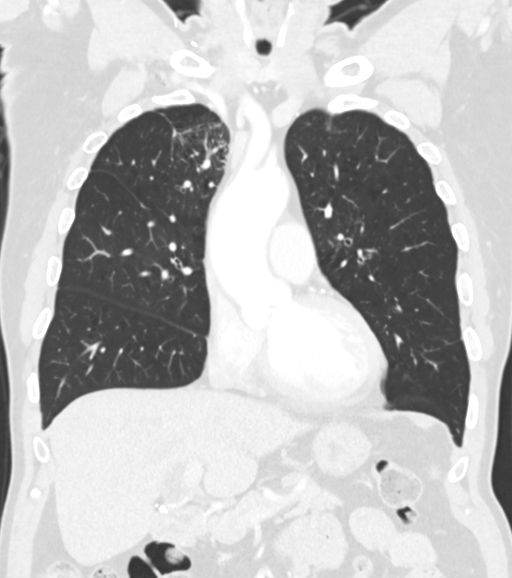
[im 87/145  lung]
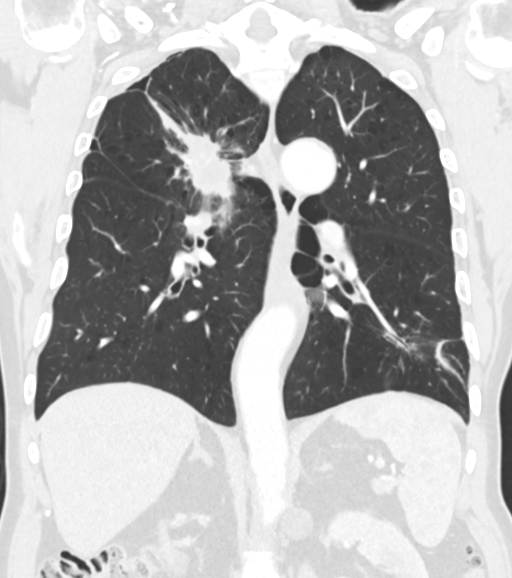

[11 of 36 positions shown; findings below may reference images not displayed]

FINDINGS: Cardiovascular: Heart size is normal. There is no significant
pericardial fluid, thickening or pericardial calcification. There is
aortic atherosclerosis, as well as atherosclerosis of the great
vessels of the mediastinum and the coronary arteries, including
calcified atherosclerotic plaque in the left anterior descending,
left circumflex and right coronary arteries. Thickening
calcification of the aortic valve. Mild calcification of the mitral
annulus.

Mediastinum/Nodes: Enlarging subcarinal (axial image 72 of series 2
measuring 1.7 cm in short axis) and right hilar (axial image 71 of
series 2) measuring 1.7 cm) lymph nodes, highly concerning for
metastatic disease. No left hilar lymphadenopathy. Esophagus is
unremarkable in appearance. No axillary lymphadenopathy.

Lungs/Pleura: Definitive enlargement of a mass-like area with convex
margins in the right upper lobe (axial image 50 of series 2 and
coronal image 85 of series 4) which currently measures 2.0 x 4.3 x
2.5 cm, compatible with progressive right upper lobe neoplasm. In
association with this there is a more central mass in the right
upper lobe (axial image 59 of series 2 and coronal image 79 of
series 4) measuring 5.0 x 4.0 x 2.3 cm which directly invades the
mediastinum and is now completely obscuring the fat plane between
the lesion and the adjacent distal trachea and proximal right
mainstem bronchus. There is a contour abnormality in the region of
the distal trachea best appreciated on coronal images 80-82 of
series 4, such that early direct invasion into the trachea is not
excluded. Slight progression of area of septal thickening and
nodularity in the left lower lobe, best appreciated on axial image
115 of series 3 where the bulkiest portion of this region measures
approximately 2.1 x 2.1 cm. No acute consolidative airspace disease.
No pleural effusions. Diffuse bronchial wall thickening with mild
centrilobular and paraseptal emphysema.

Upper Abdomen: Status post cholecystectomy. Mild intra and
extrahepatic biliary ductal dilatation, similar to prior studies,
presumably reflective of benign post cholecystectomy physiology.
Aortic atherosclerosis. Severe atrophy of the right kidney with
multiple small lesions, most concerning of which is a 1.7 cm
intermediate attenuation (43 HU) lesion extending off the posterior
aspect of the interpolar region (axial image 179 of series 2), new
compared to prior PET-CT.

Musculoskeletal: There are no aggressive appearing lytic or blastic
lesions noted in the visualized portions of the skeleton.
IMPRESSION: 1. Progressive right upper lobe malignancy. Notably, this appears
well separated from the site of the regional right upper lobe lesion
which was in the anterior aspect of the right upper lobe. On today's
study, there are 2 mass-like areas, including the more central of
these which directly invades the mediastinum, with potential direct
tracheal invasion, as well as progressive right hilar and subcarinal
lymphadenopathy.
2. In addition, in the left lower lobe at the site of the previously
noted left lower lobe lesion there has been progressive septal
thickening and nodularity which has slowly increased over numerous
prior examinations. Findings are highly concerning for locally
recurrent disease.
3. Aortic atherosclerosis, in addition to 3 vessel coronary artery
disease. Assessment for potential risk factor modification, dietary
therapy or pharmacologic therapy may be warranted, if clinically
indicated.
4. There are calcifications of the aortic valve. Echocardiographic
correlation for evaluation of potential valvular dysfunction may be
warranted if clinically indicated.

These results will be called to the ordering clinician or
representative by the Radiologist Assistant, and communication
documented in the PACS or [REDACTED].

Aortic Atherosclerosis ([XD]-[XD]) and Emphysema ([XD]-[XD]).

ADDENDUM:
As mentioned in the body of the report there is a 1.7 cm lesion in
the posterior aspect of the interpolar region of the right kidney,
suspicious for small neoplasm, either a renal cell carcinoma or
metastatic lesion of the kidney. This could be further evaluated
with nonemergent abdominal MRI with and without IV gadolinium if
clinically appropriate.

*** End of Addendum ***
FINDINGS: Cardiovascular: Heart size is normal. There is no significant
pericardial fluid, thickening or pericardial calcification. There is
aortic atherosclerosis, as well as atherosclerosis of the great
vessels of the mediastinum and the coronary arteries, including
calcified atherosclerotic plaque in the left anterior descending,
left circumflex and right coronary arteries. Thickening
calcification of the aortic valve. Mild calcification of the mitral
annulus.

Mediastinum/Nodes: Enlarging subcarinal (axial image 72 of series 2
measuring 1.7 cm in short axis) and right hilar (axial image 71 of
series 2) measuring 1.7 cm) lymph nodes, highly concerning for
metastatic disease. No left hilar lymphadenopathy. Esophagus is
unremarkable in appearance. No axillary lymphadenopathy.

Lungs/Pleura: Definitive enlargement of a mass-like area with convex
margins in the right upper lobe (axial image 50 of series 2 and
coronal image 85 of series 4) which currently measures 2.0 x 4.3 x
2.5 cm, compatible with progressive right upper lobe neoplasm. In
association with this there is a more central mass in the right
upper lobe (axial image 59 of series 2 and coronal image 79 of
series 4) measuring 5.0 x 4.0 x 2.3 cm which directly invades the
mediastinum and is now completely obscuring the fat plane between
the lesion and the adjacent distal trachea and proximal right
mainstem bronchus. There is a contour abnormality in the region of
the distal trachea best appreciated on coronal images 80-82 of
series 4, such that early direct invasion into the trachea is not
excluded. Slight progression of area of septal thickening and
nodularity in the left lower lobe, best appreciated on axial image
115 of series 3 where the bulkiest portion of this region measures
approximately 2.1 x 2.1 cm. No acute consolidative airspace disease.
No pleural effusions. Diffuse bronchial wall thickening with mild
centrilobular and paraseptal emphysema.

Upper Abdomen: Status post cholecystectomy. Mild intra and
extrahepatic biliary ductal dilatation, similar to prior studies,
presumably reflective of benign post cholecystectomy physiology.
Aortic atherosclerosis. Severe atrophy of the right kidney with
multiple small lesions, most concerning of which is a 1.7 cm
intermediate attenuation (43 HU) lesion extending off the posterior
aspect of the interpolar region (axial image 179 of series 2), new
compared to prior PET-CT.

Musculoskeletal: There are no aggressive appearing lytic or blastic
lesions noted in the visualized portions of the skeleton.
IMPRESSION: 1. Progressive right upper lobe malignancy. Notably, this appears
well separated from the site of the regional right upper lobe lesion
which was in the anterior aspect of the right upper lobe. On today's
study, there are 2 mass-like areas, including the more central of
these which directly invades the mediastinum, with potential direct
tracheal invasion, as well as progressive right hilar and subcarinal
lymphadenopathy.
2. In addition, in the left lower lobe at the site of the previously
noted left lower lobe lesion there has been progressive septal
thickening and nodularity which has slowly increased over numerous
prior examinations. Findings are highly concerning for locally
recurrent disease.
3. Aortic atherosclerosis, in addition to 3 vessel coronary artery
disease. Assessment for potential risk factor modification, dietary
therapy or pharmacologic therapy may be warranted, if clinically
indicated.
4. There are calcifications of the aortic valve. Echocardiographic
correlation for evaluation of potential valvular dysfunction may be
warranted if clinically indicated.

These results will be called to the ordering clinician or
representative by the Radiologist Assistant, and communication
documented in the PACS or [REDACTED].

Aortic Atherosclerosis ([XD]-[XD]) and Emphysema ([XD]-[XD]).

## 2021-01-24 MED ORDER — IOHEXOL 350 MG/ML SOLN
75.0000 mL | Freq: Once | INTRAVENOUS | Status: AC | PRN
  Administered 2021-01-24: 75 mL via INTRAVENOUS

## 2021-01-28 ENCOUNTER — Ambulatory Visit
Admission: RE | Admit: 2021-01-28 | Discharge: 2021-01-28 | Disposition: A | Discharge status: Home / Self Care | Payer: Medicare Other | Source: Ambulatory Visit | Attending: Radiation Oncology | Admitting: Radiation Oncology

## 2021-01-28 ENCOUNTER — Telehealth: Payer: Self-pay | Admitting: *Deleted

## 2021-01-28 ENCOUNTER — Other Ambulatory Visit: Payer: Self-pay | Admitting: *Deleted

## 2021-01-28 ENCOUNTER — Encounter: Payer: Self-pay | Admitting: Radiation Oncology

## 2021-01-28 ENCOUNTER — Other Ambulatory Visit
Admission: RE | Admit: 2021-01-28 | Discharge: 2021-01-28 | Disposition: A | Discharge status: Home / Self Care | Payer: Medicare Other | Source: Ambulatory Visit | Attending: Vascular Surgery | Admitting: Vascular Surgery

## 2021-01-28 ENCOUNTER — Other Ambulatory Visit: Payer: Self-pay

## 2021-01-28 VITALS — BP 139/79 | HR 71 | Temp 94.8°F | Resp 16 | Wt 168.8 lb

## 2021-01-28 DIAGNOSIS — C7801 Secondary malignant neoplasm of right lung: Secondary | ICD-10-CM | POA: Diagnosis not present

## 2021-01-28 DIAGNOSIS — N186 End stage renal disease: Secondary | ICD-10-CM | POA: Diagnosis not present

## 2021-01-28 DIAGNOSIS — R599 Enlarged lymph nodes, unspecified: Secondary | ICD-10-CM | POA: Diagnosis not present

## 2021-01-28 DIAGNOSIS — I6522 Occlusion and stenosis of left carotid artery: Secondary | ICD-10-CM | POA: Diagnosis not present

## 2021-01-28 DIAGNOSIS — Z08 Encounter for follow-up examination after completed treatment for malignant neoplasm: Secondary | ICD-10-CM | POA: Diagnosis not present

## 2021-01-28 DIAGNOSIS — Z79899 Other long term (current) drug therapy: Secondary | ICD-10-CM | POA: Diagnosis not present

## 2021-01-28 DIAGNOSIS — Z01812 Encounter for preprocedural laboratory examination: Secondary | ICD-10-CM | POA: Insufficient documentation

## 2021-01-28 DIAGNOSIS — I2510 Atherosclerotic heart disease of native coronary artery without angina pectoris: Secondary | ICD-10-CM | POA: Diagnosis not present

## 2021-01-28 DIAGNOSIS — Z8249 Family history of ischemic heart disease and other diseases of the circulatory system: Secondary | ICD-10-CM | POA: Diagnosis not present

## 2021-01-28 DIAGNOSIS — E785 Hyperlipidemia, unspecified: Secondary | ICD-10-CM | POA: Diagnosis not present

## 2021-01-28 DIAGNOSIS — C3411 Malignant neoplasm of upper lobe, right bronchus or lung: Secondary | ICD-10-CM | POA: Insufficient documentation

## 2021-01-28 DIAGNOSIS — N289 Disorder of kidney and ureter, unspecified: Secondary | ICD-10-CM | POA: Insufficient documentation

## 2021-01-28 DIAGNOSIS — J449 Chronic obstructive pulmonary disease, unspecified: Secondary | ICD-10-CM | POA: Diagnosis not present

## 2021-01-28 DIAGNOSIS — I5022 Chronic systolic (congestive) heart failure: Secondary | ICD-10-CM | POA: Diagnosis not present

## 2021-01-28 DIAGNOSIS — R590 Localized enlarged lymph nodes: Secondary | ICD-10-CM | POA: Insufficient documentation

## 2021-01-28 DIAGNOSIS — R918 Other nonspecific abnormal finding of lung field: Secondary | ICD-10-CM | POA: Insufficient documentation

## 2021-01-28 DIAGNOSIS — Z7902 Long term (current) use of antithrombotics/antiplatelets: Secondary | ICD-10-CM | POA: Diagnosis not present

## 2021-01-28 DIAGNOSIS — Z87442 Personal history of urinary calculi: Secondary | ICD-10-CM | POA: Diagnosis not present

## 2021-01-28 DIAGNOSIS — C3432 Malignant neoplasm of lower lobe, left bronchus or lung: Secondary | ICD-10-CM | POA: Diagnosis not present

## 2021-01-28 DIAGNOSIS — Z20822 Contact with and (suspected) exposure to covid-19: Secondary | ICD-10-CM | POA: Diagnosis not present

## 2021-01-28 DIAGNOSIS — Z9049 Acquired absence of other specified parts of digestive tract: Secondary | ICD-10-CM | POA: Diagnosis not present

## 2021-01-28 DIAGNOSIS — F17210 Nicotine dependence, cigarettes, uncomplicated: Secondary | ICD-10-CM | POA: Diagnosis not present

## 2021-01-28 DIAGNOSIS — Z7984 Long term (current) use of oral hypoglycemic drugs: Secondary | ICD-10-CM | POA: Diagnosis not present

## 2021-01-28 DIAGNOSIS — K219 Gastro-esophageal reflux disease without esophagitis: Secondary | ICD-10-CM | POA: Diagnosis not present

## 2021-01-28 DIAGNOSIS — I132 Hypertensive heart and chronic kidney disease with heart failure and with stage 5 chronic kidney disease, or end stage renal disease: Secondary | ICD-10-CM | POA: Diagnosis not present

## 2021-01-28 DIAGNOSIS — C3490 Malignant neoplasm of unspecified part of unspecified bronchus or lung: Secondary | ICD-10-CM | POA: Diagnosis not present

## 2021-01-28 DIAGNOSIS — Z7982 Long term (current) use of aspirin: Secondary | ICD-10-CM | POA: Diagnosis not present

## 2021-01-28 DIAGNOSIS — E1122 Type 2 diabetes mellitus with diabetic chronic kidney disease: Secondary | ICD-10-CM | POA: Diagnosis not present

## 2021-01-28 NOTE — Progress Notes (Signed)
Radiation Oncology Follow up Note  Name: Philip Wood   Date:   01/28/2021 MRN:  [id number] DOB: 10-Feb-1946    This 75 y.o. male presents to the clinic today for 1 year follow-up status post SBRT to his right upper lobe for stage I non-small cell lung cancer.  REFERRING PROVIDER: Venita Lick, NP  HPI: Patient is a 75 year old male now at 1 year having completed SBRT to his right upper lobe for an stage I non-small cell lung cancer.  Seen today in routine follow-up he is doing fairly well does have some trace hemoptysis on occasion.  Has slightly productive cough..  His CT scans have been showing some possible progression.  He had a recent CT scan showing progressive right upper lobe malignancy well separated from the site of the regional right upper lobe lesion previously treated.  There were 2 masslike areas including more central which possibly directly invades the mediastinum and possible tracheal invasion.  There is also progressive right hilar and subcarinal adenopathy.  There is also progression in the left lower lobe concerning for locally recurrent disease.  COMPLICATIONS OF TREATMENT: none  FOLLOW UP COMPLIANCE: keeps appointments   PHYSICAL EXAM:  BP 139/79 (BP Location: Left Arm, Patient Position: Sitting)   Pulse 71   Temp (!) 94.8 F (34.9 C) (Tympanic)   Resp 16   Wt 168 lb 12.8 oz (76.6 kg)   BMI 24.93 kg/m  Well-developed well-nourished patient in NAD. HEENT reveals PERLA, EOMI, discs not visualized.  Oral cavity is clear. No oral mucosal lesions are identified. Neck is clear without evidence of cervical or supraclavicular adenopathy. Lungs are clear to A&P. Cardiac examination is essentially unremarkable with regular rate and rhythm without murmur rub or thrill. Abdomen is benign with no organomegaly or masses noted. Motor sensory and DTR levels are equal and symmetric in the upper and lower extremities. Cranial nerves II through XII are grossly intact.  Proprioception is intact. No peripheral adenopathy or edema is identified. No motor or sensory levels are noted. Crude visual fields are within normal range.  RADIOLOGY RESULTS: CT scans reviewed PET CT scan ordered  PLAN: Present time of order to assess PET CT scan for further clarification of disease progression of this patient.  I will also get medical oncology involved.  There may be possibility of bronchoscopy to determine tissue type of this new progressive possible malignancy.  This was all explained in detail to the patient.  Have set up a follow-up appointment after his PET CT scan.  He is having a carotid stenting this week.  Patient also has a an addendum report a possible 1.7 cm lesion in the posterior aspect of the right kidney concerning for a small renal cell carcinoma.  Patient also does have end-stage renal disease.  I would like to take this opportunity to thank you for allowing me to participate in the care of your patient.[name], MD

## 2021-01-28 NOTE — Telephone Encounter (Signed)
Please schedule pt for MD follow up for CT results.

## 2021-01-28 NOTE — Telephone Encounter (Signed)
Called report ADDENDUM REPORT: 01/26/2021 12:24   ADDENDUM: As mentioned in the body of the report there is a 1.7 cm lesion in the posterior aspect of the interpolar region of the right kidney, suspicious for small neoplasm, either a renal cell carcinoma or metastatic lesion of the kidney. This could be further evaluated with nonemergent abdominal MRI with and without IV gadolinium if clinically appropriate.     Electronically Signed   By: Vinnie Langton M.D.   On: 01/26/2021 12:24  IMPRESSION: 1. Progressive right upper lobe malignancy. Notably, this appears well separated from the site of the regional right upper lobe lesion which was in the anterior aspect of the right upper lobe. On today's study, there are 2 mass-like areas, including the more central of these which directly invades the mediastinum, with potential direct tracheal invasion, as well as progressive right hilar and subcarinal lymphadenopathy. 2. In addition, in the left lower lobe at the site of the previously noted left lower lobe lesion there has been progressive septal thickening and nodularity which has slowly increased over numerous prior examinations. Findings are highly concerning for locally recurrent disease. 3. Aortic atherosclerosis, in addition to 3 vessel coronary artery disease. Assessment for potential risk factor modification, dietary therapy or pharmacologic therapy may be warranted, if clinically indicated. 4. There are calcifications of the aortic valve. Echocardiographic correlation for evaluation of potential valvular dysfunction may be warranted if clinically indicated.   These results will be called to the ordering clinician or representative by the Radiologist Assistant, and communication documented in the PACS or [employer].   Aortic Atherosclerosis (ICD10-I70.0) and Emphysema (ICD10-J43.9).   Electronically Signed: By: Vinnie Langton M.D. On: 01/26/2021 10:07

## 2021-01-29 LAB — SARS CORONAVIRUS 2 (TAT 6-24 HRS): SARS Coronavirus 2: NEGATIVE

## 2021-01-29 NOTE — Telephone Encounter (Signed)
PET scheduled on 10/24, please schedule follow up, MD only, a few days after PET.

## 2021-01-30 ENCOUNTER — Inpatient Hospital Stay
Admission: RE | Admit: 2021-01-30 | Discharge: 2021-01-31 | DRG: 035 | Disposition: A | Discharge status: Home / Self Care | Payer: Medicare Other | Attending: Vascular Surgery | Admitting: Vascular Surgery

## 2021-01-30 ENCOUNTER — Other Ambulatory Visit (INDEPENDENT_AMBULATORY_CARE_PROVIDER_SITE_OTHER): Payer: Self-pay | Admitting: Nurse Practitioner

## 2021-01-30 ENCOUNTER — Encounter: Payer: Self-pay | Admitting: Vascular Surgery

## 2021-01-30 ENCOUNTER — Encounter
Admission: RE | Disposition: A | Discharge status: Home / Self Care | Payer: Self-pay | Source: Ambulatory Visit | Attending: Vascular Surgery

## 2021-01-30 ENCOUNTER — Other Ambulatory Visit: Payer: Self-pay

## 2021-01-30 DIAGNOSIS — I6529 Occlusion and stenosis of unspecified carotid artery: Secondary | ICD-10-CM | POA: Diagnosis present

## 2021-01-30 DIAGNOSIS — I2510 Atherosclerotic heart disease of native coronary artery without angina pectoris: Secondary | ICD-10-CM | POA: Diagnosis present

## 2021-01-30 DIAGNOSIS — Z7982 Long term (current) use of aspirin: Secondary | ICD-10-CM | POA: Diagnosis not present

## 2021-01-30 DIAGNOSIS — I132 Hypertensive heart and chronic kidney disease with heart failure and with stage 5 chronic kidney disease, or end stage renal disease: Secondary | ICD-10-CM | POA: Diagnosis present

## 2021-01-30 DIAGNOSIS — Z79899 Other long term (current) drug therapy: Secondary | ICD-10-CM

## 2021-01-30 DIAGNOSIS — E785 Hyperlipidemia, unspecified: Secondary | ICD-10-CM | POA: Diagnosis present

## 2021-01-30 DIAGNOSIS — I6522 Occlusion and stenosis of left carotid artery: Secondary | ICD-10-CM

## 2021-01-30 DIAGNOSIS — C3490 Malignant neoplasm of unspecified part of unspecified bronchus or lung: Secondary | ICD-10-CM | POA: Diagnosis present

## 2021-01-30 DIAGNOSIS — Z20822 Contact with and (suspected) exposure to covid-19: Secondary | ICD-10-CM | POA: Diagnosis present

## 2021-01-30 DIAGNOSIS — F17210 Nicotine dependence, cigarettes, uncomplicated: Secondary | ICD-10-CM | POA: Diagnosis present

## 2021-01-30 DIAGNOSIS — R599 Enlarged lymph nodes, unspecified: Secondary | ICD-10-CM | POA: Diagnosis present

## 2021-01-30 DIAGNOSIS — N186 End stage renal disease: Secondary | ICD-10-CM | POA: Diagnosis present

## 2021-01-30 DIAGNOSIS — K219 Gastro-esophageal reflux disease without esophagitis: Secondary | ICD-10-CM | POA: Diagnosis present

## 2021-01-30 DIAGNOSIS — I5022 Chronic systolic (congestive) heart failure: Secondary | ICD-10-CM | POA: Diagnosis present

## 2021-01-30 DIAGNOSIS — Z9049 Acquired absence of other specified parts of digestive tract: Secondary | ICD-10-CM | POA: Diagnosis not present

## 2021-01-30 DIAGNOSIS — Z7902 Long term (current) use of antithrombotics/antiplatelets: Secondary | ICD-10-CM

## 2021-01-30 DIAGNOSIS — J449 Chronic obstructive pulmonary disease, unspecified: Secondary | ICD-10-CM | POA: Diagnosis present

## 2021-01-30 DIAGNOSIS — Z87442 Personal history of urinary calculi: Secondary | ICD-10-CM

## 2021-01-30 DIAGNOSIS — E1122 Type 2 diabetes mellitus with diabetic chronic kidney disease: Secondary | ICD-10-CM | POA: Diagnosis present

## 2021-01-30 DIAGNOSIS — Z8249 Family history of ischemic heart disease and other diseases of the circulatory system: Secondary | ICD-10-CM | POA: Diagnosis not present

## 2021-01-30 DIAGNOSIS — Z7984 Long term (current) use of oral hypoglycemic drugs: Secondary | ICD-10-CM

## 2021-01-30 DIAGNOSIS — N189 Chronic kidney disease, unspecified: Secondary | ICD-10-CM | POA: Diagnosis present

## 2021-01-30 DIAGNOSIS — Z01818 Encounter for other preprocedural examination: Secondary | ICD-10-CM

## 2021-01-30 DIAGNOSIS — G453 Amaurosis fugax: Secondary | ICD-10-CM

## 2021-01-30 DIAGNOSIS — I130 Hypertensive heart and chronic kidney disease with heart failure and stage 1 through stage 4 chronic kidney disease, or unspecified chronic kidney disease: Secondary | ICD-10-CM | POA: Diagnosis present

## 2021-01-30 HISTORY — PX: CAROTID PTA/STENT INTERVENTION: CATH118231

## 2021-01-30 LAB — HEMOGLOBIN A1C
Hgb A1c MFr Bld: 6 % — ABNORMAL HIGH (ref 4.8–5.6)
Mean Plasma Glucose: 126 mg/dL

## 2021-01-30 LAB — GLUCOSE, CAPILLARY
Glucose-Capillary: 108 mg/dL — ABNORMAL HIGH (ref 70–99)
Glucose-Capillary: 97 mg/dL (ref 70–99)
Glucose-Capillary: 125 mg/dL — ABNORMAL HIGH (ref 70–99)
Glucose-Capillary: 116 mg/dL — ABNORMAL HIGH (ref 70–99)

## 2021-01-30 LAB — BUN: BUN: 19 mg/dL (ref 8–23)

## 2021-01-30 LAB — CREATININE, SERUM
Creatinine, Ser: 0.93 mg/dL (ref 0.61–1.24)
GFR, Estimated: >60 mL/min (ref >60)

## 2021-01-30 LAB — POCT ACTIVATED CLOTTING TIME: Activated Clotting Time: 254 seconds

## 2021-01-30 LAB — MRSA NEXT GEN BY PCR, NASAL: MRSA by PCR Next Gen: NOT DETECTED

## 2021-01-30 SURGERY — CAROTID PTA/STENT INTERVENTION
Anesthesia: Moderate Sedation | Laterality: Left

## 2021-01-30 MED ORDER — HEPARIN SODIUM (PORCINE) 1000 UNIT/ML IJ SOLN
INTRAMUSCULAR | Status: DC | PRN
  Administered 2021-01-30: 7000 [IU] via INTRAVENOUS
  Administered 2021-01-30: 3000 [IU] via INTRAVENOUS

## 2021-01-30 MED ORDER — HYDROMORPHONE HCL 1 MG/ML IJ SOLN: 1.0000 mg | Freq: Once | INTRAMUSCULAR | Status: DC | PRN

## 2021-01-30 MED ORDER — POTASSIUM CHLORIDE IN NACL 20-0.9 MEQ/L-% IV SOLN
INTRAVENOUS | Status: AC
  Filled 2021-01-30 (×3): qty 1000

## 2021-01-30 MED ORDER — ONDANSETRON HCL 4 MG/2ML IJ SOLN: 4.0000 mg | Freq: Four times a day (QID) | INTRAMUSCULAR | Status: DC | PRN

## 2021-01-30 MED ORDER — FENTANYL CITRATE (PF) 100 MCG/2ML IJ SOLN
INTRAMUSCULAR | Status: DC | PRN
  Administered 2021-01-30: 25 ug via INTRAVENOUS
  Administered 2021-01-30: 50 ug via INTRAVENOUS

## 2021-01-30 MED ORDER — PHENYLEPHRINE 40 MCG/ML (10ML) SYRINGE FOR IV PUSH (FOR BLOOD PRESSURE SUPPORT)
PREFILLED_SYRINGE | INTRAVENOUS | Status: AC
  Filled 2021-01-30: qty 10

## 2021-01-30 MED ORDER — GUAIFENESIN-DM 100-10 MG/5ML PO SYRP: 15.0000 mL | ORAL_SOLUTION | ORAL | Status: DC | PRN

## 2021-01-30 MED ORDER — RAMIPRIL 10 MG PO CAPS
10.0000 mg | ORAL_CAPSULE | Freq: Every day | ORAL | Status: DC
  Administered 2021-01-31: 10 mg via ORAL
  Filled 2021-01-30: qty 1

## 2021-01-30 MED ORDER — MIDAZOLAM HCL 2 MG/2ML IJ SOLN
INTRAMUSCULAR | Status: AC
  Filled 2021-01-30: qty 2

## 2021-01-30 MED ORDER — LABETALOL HCL 5 MG/ML IV SOLN
10.0000 mg | INTRAVENOUS | Status: DC | PRN
Start: 2021-01-30 — End: 2021-01-31

## 2021-01-30 MED ORDER — TIOTROPIUM BROMIDE MONOHYDRATE 18 MCG IN CAPS
18.0000 ug | ORAL_CAPSULE | Freq: Every morning | RESPIRATORY_TRACT | Status: DC
  Administered 2021-01-31: 18 ug via RESPIRATORY_TRACT
  Filled 2021-01-30: qty 5

## 2021-01-30 MED ORDER — CEFAZOLIN SODIUM-DEXTROSE 2-4 GM/100ML-% IV SOLN
INTRAVENOUS | Status: AC
  Administered 2021-01-30: 2 g via INTRAVENOUS
  Filled 2021-01-30: qty 100

## 2021-01-30 MED ORDER — METOPROLOL TARTRATE 5 MG/5ML IV SOLN
2.0000 mg | INTRAVENOUS | Status: DC | PRN
Start: 2021-01-30 — End: 2021-01-31

## 2021-01-30 MED ORDER — CEFAZOLIN SODIUM-DEXTROSE 2-4 GM/100ML-% IV SOLN
2.0000 g | Freq: Three times a day (TID) | INTRAVENOUS | Status: AC
  Administered 2021-01-30 – 2021-01-31 (×2): 2 g via INTRAVENOUS
  Filled 2021-01-30 (×3): qty 100

## 2021-01-30 MED ORDER — ATROPINE SULFATE 1 MG/10ML IJ SOSY
PREFILLED_SYRINGE | INTRAMUSCULAR | Status: DC | PRN
  Administered 2021-01-30: 1 mg via INTRAVENOUS

## 2021-01-30 MED ORDER — FENTANYL CITRATE (PF) 100 MCG/2ML IJ SOLN
INTRAMUSCULAR | Status: AC
  Filled 2021-01-30: qty 2

## 2021-01-30 MED ORDER — METOPROLOL SUCCINATE ER 50 MG PO TB24
25.0000 mg | ORAL_TABLET | Freq: Every day | ORAL | Status: DC
  Administered 2021-01-31: 25 mg via ORAL
  Filled 2021-01-30: qty 1

## 2021-01-30 MED ORDER — ACETAMINOPHEN 325 MG PO TABS: 325.0000 mg | ORAL_TABLET | ORAL | Status: DC | PRN

## 2021-01-30 MED ORDER — MIDAZOLAM HCL 2 MG/2ML IJ SOLN
INTRAMUSCULAR | Status: DC | PRN
  Administered 2021-01-30: 1 mg via INTRAVENOUS
  Administered 2021-01-30: 2 mg via INTRAVENOUS

## 2021-01-30 MED ORDER — METHYLPREDNISOLONE SODIUM SUCC 125 MG IJ SOLR: 125.0000 mg | Freq: Once | INTRAMUSCULAR | Status: DC | PRN

## 2021-01-30 MED ORDER — HYDRALAZINE HCL 20 MG/ML IJ SOLN: 5.0000 mg | INTRAMUSCULAR | Status: DC | PRN

## 2021-01-30 MED ORDER — INSULIN ASPART 100 UNIT/ML IJ SOLN: 0.0000 [IU] | Freq: Three times a day (TID) | INTRAMUSCULAR | Status: DC

## 2021-01-30 MED ORDER — MIDAZOLAM HCL 2 MG/ML PO SYRP: 8.0000 mg | ORAL_SOLUTION | Freq: Once | ORAL | Status: DC | PRN

## 2021-01-30 MED ORDER — SODIUM CHLORIDE 0.9 % IV SOLN: INTRAVENOUS | Status: DC

## 2021-01-30 MED ORDER — ATROPINE SULFATE 1 MG/10ML IJ SOSY
PREFILLED_SYRINGE | INTRAMUSCULAR | Status: AC
  Filled 2021-01-30: qty 10

## 2021-01-30 MED ORDER — ATORVASTATIN CALCIUM 80 MG PO TABS
80.0000 mg | ORAL_TABLET | Freq: Every day | ORAL | Status: DC
  Administered 2021-01-30: 80 mg via ORAL
  Filled 2021-01-30: qty 1
  Filled 2021-01-30: qty 4
  Filled 2021-01-30: qty 1

## 2021-01-30 MED ORDER — ASPIRIN EC 81 MG PO TBEC
81.0000 mg | DELAYED_RELEASE_TABLET | Freq: Every morning | ORAL | Status: DC
  Administered 2021-01-31: 81 mg via ORAL
  Filled 2021-01-30: qty 1

## 2021-01-30 MED ORDER — DIPHENHYDRAMINE HCL 50 MG/ML IJ SOLN: 50.0000 mg | Freq: Once | INTRAMUSCULAR | Status: DC | PRN

## 2021-01-30 MED ORDER — CEFAZOLIN SODIUM-DEXTROSE 2-4 GM/100ML-% IV SOLN: 2.0000 g | Freq: Once | INTRAVENOUS | Status: AC

## 2021-01-30 MED ORDER — PANTOPRAZOLE SODIUM 40 MG IV SOLR
40.0000 mg | Freq: Every day | INTRAVENOUS | Status: DC
  Administered 2021-01-30: 40 mg via INTRAVENOUS
  Filled 2021-01-30: qty 40

## 2021-01-30 MED ORDER — DOCUSATE SODIUM 100 MG PO CAPS
100.0000 mg | ORAL_CAPSULE | Freq: Every day | ORAL | Status: DC
  Filled 2021-01-30: qty 1

## 2021-01-30 MED ORDER — ALUM & MAG HYDROXIDE-SIMETH 200-200-20 MG/5ML PO SUSP: 15.0000 mL | ORAL | Status: DC | PRN

## 2021-01-30 MED ORDER — CLOPIDOGREL BISULFATE 75 MG PO TABS
75.0000 mg | ORAL_TABLET | Freq: Every day | ORAL | Status: DC
  Administered 2021-01-31: 75 mg via ORAL
  Filled 2021-01-30: qty 1

## 2021-01-30 MED ORDER — FAMOTIDINE 20 MG PO TABS: 40.0000 mg | ORAL_TABLET | Freq: Once | ORAL | Status: DC | PRN

## 2021-01-30 MED ORDER — HEPARIN SODIUM (PORCINE) 1000 UNIT/ML IJ SOLN
INTRAMUSCULAR | Status: AC
  Filled 2021-01-30: qty 1

## 2021-01-30 MED ORDER — PHENOL 1.4 % MT LIQD
1.0000 | OROMUCOSAL | Status: DC | PRN
  Filled 2021-01-30: qty 177

## 2021-01-30 MED ORDER — SODIUM CHLORIDE 0.9 % IV SOLN: 500.0000 mL | Freq: Once | INTRAVENOUS | Status: DC | PRN

## 2021-01-30 MED ORDER — MIRTAZAPINE 7.5 MG PO TABS
7.5000 mg | ORAL_TABLET | Freq: Every day | ORAL | Status: DC
  Administered 2021-01-30: 7.5 mg via ORAL
  Filled 2021-01-30 (×2): qty 1

## 2021-01-30 MED ORDER — PHENYLEPHRINE HCL-NACL 20-0.9 MG/250ML-% IV SOLN
INTRAVENOUS | Status: AC
  Filled 2021-01-30: qty 250

## 2021-01-30 MED ORDER — CHLORHEXIDINE GLUCONATE CLOTH 2 % EX PADS
6.0000 | MEDICATED_PAD | Freq: Every day | CUTANEOUS | Status: DC
  Administered 2021-01-30: 6 via TOPICAL

## 2021-01-30 MED ORDER — ACETAMINOPHEN 325 MG RE SUPP
325.0000 mg | RECTAL | Status: DC | PRN
  Filled 2021-01-30: qty 2

## 2021-01-30 SURGICAL SUPPLY — 22 items
BALLN VIATRAC 5.5X20X135 (BALLOONS) ×2
BALLOON VIATRAC 5.5X20X135 (BALLOONS) ×1 IMPLANT
CANNULA 5F STIFF (CANNULA) ×2 IMPLANT
CATH ANGIO 5F PIGTAIL 100CM (CATHETERS) ×2 IMPLANT
CATH BEACON 5 .035 100 H1 TIP (CATHETERS) ×2 IMPLANT
CATH BEACON 5 .035 100 SIM1 TP (CATHETERS) ×2 IMPLANT
COVER PROBE U/S 5X48 (MISCELLANEOUS) ×2 IMPLANT
DEVICE EMBOSHIELD NAV6 4.0-7.0 (FILTER) ×2 IMPLANT
DEVICE SAFEGUARD 24CM (GAUZE/BANDAGES/DRESSINGS) ×2 IMPLANT
DEVICE STARCLOSE SE CLOSURE (Vascular Products) ×2 IMPLANT
DEVICE TORQUE .025-.038 (MISCELLANEOUS) ×2 IMPLANT
GLIDEWIRE ANGLED SS 035X260CM (WIRE) ×2 IMPLANT
GUIDEWIRE VASC STIFF .038X260 (WIRE) ×2 IMPLANT
KIT CAROTID MANIFOLD (MISCELLANEOUS) ×2 IMPLANT
KIT ENCORE 26 ADVANTAGE (KITS) ×2 IMPLANT
PACK ANGIOGRAPHY (CUSTOM PROCEDURE TRAY) ×2 IMPLANT
SHEATH BRITE TIP 5FRX11 (SHEATH) ×2 IMPLANT
SHEATH NEURON MAX 6FR 90CM (CATHETERS) ×2 IMPLANT
STENT XACT CAR 10X30X136 (Permanent Stent) ×2 IMPLANT
SYR MEDRAD MARK 7 150ML (SYRINGE) ×2 IMPLANT
TUBING CONTRAST HIGH PRESS 72 (TUBING) ×2 IMPLANT
WIRE GUIDERIGHT .035X150 (WIRE) ×2 IMPLANT

## 2021-01-30 NOTE — Op Note (Deleted)
@LOGO @   MRN : [id number]  Philip Wood is a 75 y.o. (08/20/1945) male who presents with chief complaint of fix the carotid.  History of Present Illness:  The patient is seen for follow up evaluation of carotid stenosis status post CT angiogram. CT scan was done 11/14/2020. Patient reports that the test went well with no problems or complications.    The patient denies interval amaurosis fugax. There is no recent or interval TIA symptoms or focal motor deficits. There is no prior documented CVA.   The patient is taking enteric-coated aspirin 81 mg daily.   There is a history of severe headaches.  And he is status post temporal artery biopsy on 11/14/2020.  Pathology report has returned negative for temporal arteritis.     The patient has a history of coronary artery disease, no recent episodes of angina or shortness of breath. The patient denies PAD or claudication symptoms. There is a history of hyperlipidemia which is being treated with a statin.     CT angiogram is reviewed by me personally and shows 80% stenosis consistent with calcified plaque at the origin of the left common carotid artery.    Current Meds  Medication Sig   acetaminophen (TYLENOL) 500 MG tablet Take 500-1,000 mg by mouth every 6 (six) hours as needed (for pain).   allopurinol (ZYLOPRIM) 100 MG tablet Take 1 tablet (100 mg total) by mouth daily. (Patient taking differently: Take 100 mg by mouth in the morning.)   aspirin EC 81 MG tablet Take 81 mg by mouth in the morning. Swallow whole.   atorvastatin (LIPITOR) 80 MG tablet Take 1 tablet (80 mg total) by mouth daily at 6 PM.   clopidogrel (PLAVIX) 75 MG tablet Take 1 tablet (75 mg total) by mouth daily.   metFORMIN (GLUCOPHAGE) 500 MG tablet Take 1 tablet (500 mg total) by mouth 2 (two) times daily with a meal.   metoprolol succinate (TOPROL XL) 25 MG 24 hr tablet Take 1 tablet (25 mg total) by mouth daily. (Patient taking differently: Take 25 mg by mouth in the  morning.)   mirtazapine (REMERON) 7.5 MG tablet Take 1 tablet (7.5 mg total) by mouth at bedtime.   ramipril (ALTACE) 10 MG capsule Take 1 capsule (10 mg total) by mouth daily. (Patient taking differently: Take 10 mg by mouth in the morning.)   tiotropium (SPIRIVA HANDIHALER) 18 MCG inhalation capsule PLACE 1 CAPSULE INTO INHALER AND INHALE ITS CONTENTS ONCE DAILY. (Patient taking differently: Place 18 mcg into inhaler and inhale in the morning.)   vitamin B-12 (CYANOCOBALAMIN) 1000 MCG tablet Take 1,000 mcg by mouth daily.    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   Chronic heart failure (HCC)    Chronic kidney disease    COPD (chronic obstructive pulmonary disease) (HCC)    Coronary artery disease    Diabetes mellitus without complication (HCC)    GERD (gastroesophageal reflux disease)    Headache    History of kidney stones    Hyperlipidemia    Hypertension    Lumbago    Neuropathy    Tobacco abuse disorder     Past Surgical History:  Procedure Laterality Date   ARTERY BIOPSY Right 11/14/2020   Procedure: BIOPSY TEMPORAL ARTERY;  Surgeon: Katha Cabal, MD;  Location: [facility] ORS;  Service: Vascular;  Laterality: Right;   [place]  BROCHOSCOPY N/A 08/19/2017   Procedure: ELECTROMAGNETIC NAVIGATION BRONCHOSCOPY;  Surgeon: Flora Lipps, MD;  Location: [facility] OR[facility];  Service: Cardiopulmonary;  Laterality: N/A;   ELECTROMAGNETIC NAVIGATION BROCHOSCOPY Left 09/02/2017   Procedure: ELECTROMAGNETIC NAVIGATION BRONCHOSCOPY;  Surgeon: Flora Lipps, MD;  Location: [facility] ORS;  Service: Cardiopulmonary;  Laterality: Left;   THROAT SURGERY      Social History Social History   Tobacco Use   Smoking status: Light Smoker    Packs/day: 0.60    Years: 53.00    Pack years: 31.80    Types: Cigarettes   Smokeless tobacco: Never   Tobacco comments:    Aprrox 10 cigs/day  Vaping Use   Vaping Use:  Never used  Substance Use Topics   Alcohol use: No    Alcohol/week: 0.0 standard drinks   Drug use: No    Family History Family History  Problem Relation Age of Onset   Heart disease Father    Heart attack Father    Brain cancer Sister    Cervical cancer Sister    Rectal cancer Brother    Lung cancer Brother    Lung cancer Brother    Lung cancer Brother    Stomach cancer Sister    Lung cancer Brother    Stomach cancer Sister    Diabetes Sister    Lupus Sister     No Known Allergies   REVIEW OF SYSTEMS (Negative unless checked)  Constitutional: [] Weight loss  [] Fever  [] Chills Cardiac: [] Chest pain   [] Chest pressure   [] Palpitations   [] Shortness of breath when laying flat   [] Shortness of breath with exertion. Vascular:  [] Pain in legs with walking   [] Pain in legs at rest  [] History of DVT   [] Phlebitis   [] Swelling in legs   [] Varicose veins   [] Non-healing ulcers Pulmonary:   [] Uses home oxygen   [] Productive cough   [] Hemoptysis   [] Wheeze  [] COPD   [] Asthma Neurologic:  [] Dizziness   [] Seizures   [] History of stroke   [] History of TIA  [] Aphasia   [] Vissual changes   [] Weakness or numbness in arm   [] Weakness or numbness in leg Musculoskeletal:   [] Joint swelling   [] Joint pain   [] Low back pain Hematologic:  [] Easy bruising  [] Easy bleeding   [] Hypercoagulable state   [] Anemic Gastrointestinal:  [] Diarrhea   [] Vomiting  [] Gastroesophageal reflux/heartburn   [] Difficulty swallowing. Genitourinary:  [] Chronic kidney disease   [] Difficult urination  [] Frequent urination   [] Blood in urine Skin:  [] Rashes   [] Ulcers  Psychological:  [] History of anxiety   []  History of major depression.  Physical Examination  Vitals:   01/30/21 0738  BP: 132/79  Pulse: 76  Resp: 18  Temp: (!) 97.5 F (36.4 C)  TempSrc: Oral  SpO2: 98%  Weight: 76.2 kg  Height: 5\' 9"  (1.753 m)   Body mass index is 24.81 kg/m. Gen: WD/WN, NAD Head: [place]/AT, No temporalis wasting.   Ear/Nose/Throat: Hearing grossly intact, nares w/o erythema or drainage Eyes: PER, EOMI, sclera nonicteric.  Neck: Supple, no masses.  No bruit or JVD.  Pulmonary:  Good air movement, no audible wheezing, no use of accessory muscles.  Cardiac: RRR, normal S1, S2, no Murmurs. Vascular:   bilateral carotid bruit Vessel Right Left  Radial Palpable Palpable  Carotid Palpable Palpable  Gastrointestinal: soft, non-distended. No guarding/no peritoneal signs.  Musculoskeletal: M/S 5/5 throughout.  No visible deformity.  Neurologic: CN 2-12 intact. Pain and light touch intact in extremities.  Symmetrical.  Speech is fluent. Motor exam as listed above. Psychiatric: Judgment intact, Mood & affect appropriate for pt's clinical situation. Dermatologic: No rashes or ulcers noted.  No changes consistent with cellulitis.   CBC Lab Results  Component Value Date   WBC 5.4 11/13/2020   HGB 14.6 11/14/2020   HCT 43.0 11/14/2020   MCV 93.7 11/13/2020   PLT 177 11/13/2020    BMET    Component Value Date/Time   NA 142 11/28/2020 0904   K 5.0 11/28/2020 0904   CL 103 11/28/2020 0904   CO2 25 11/28/2020 0904   GLUCOSE 103 (H) 11/28/2020 0904   GLUCOSE 115 (H) 11/14/2020 0753   BUN 19 01/30/2021 0740   BUN 13 11/28/2020 0904   CREATININE 0.93 01/30/2021 0740   CALCIUM 9.5 11/28/2020 0904   GFRNONAA >60 01/30/2021 0740   GFRAA 72 05/31/2020 0904   Estimated Creatinine Clearance: 68.6 mL/min (by C-G formula based on SCr of 0.93 mg/dL).  COAG No results found for: INR, PROTIME  Radiology CT Chest W Contrast  Addendum Date: 01/26/2021   ADDENDUM REPORT: 01/26/2021 12:24 ADDENDUM: As mentioned in the body of the report there is a 1.7 cm lesion in the posterior aspect of the interpolar region of the right kidney, suspicious for small neoplasm, either a renal cell carcinoma or metastatic lesion of the kidney. This could be further evaluated with nonemergent abdominal MRI with and without IV  gadolinium if clinically appropriate. Electronically Signed   By: Vinnie Langton M.D.   On: 01/26/2021 12:24   Result Date: 01/26/2021 CLINICAL DATA:  75 year old male with history of non-small cell lung cancer treated with radiation therapy presenting with 1 week of hemoptysis and cough. Follow-up study. EXAM: CT CHEST WITH CONTRAST TECHNIQUE: Multidetector CT imaging of the chest was performed during intravenous contrast administration. CONTRAST:  35mL OMNIPAQUE IOHEXOL 350 MG/ML SOLN COMPARISON:  CT of the chest 07/30/2020. FINDINGS: Cardiovascular: Heart size is normal. There is no significant pericardial fluid, thickening or pericardial calcification. There is aortic atherosclerosis, as well as atherosclerosis of the great vessels of the mediastinum and the coronary arteries, including calcified atherosclerotic plaque in the left anterior descending, left circumflex and right coronary arteries. Thickening calcification of the aortic valve. Mild calcification of the mitral annulus. Mediastinum/Nodes: Enlarging subcarinal (axial image 72 of series 2 measuring 1.7 cm in short axis) and right hilar (axial image 71 of series 2) measuring 1.7 cm) lymph nodes, highly concerning for metastatic disease. No left hilar lymphadenopathy. Esophagus is unremarkable in appearance. No axillary lymphadenopathy. Lungs/Pleura: Definitive enlargement of a mass-like area with convex margins in the right upper lobe (axial image 50 of series 2 and coronal image 85 of series 4) which currently measures 2.0 x 4.3 x 2.5 cm, compatible with progressive right upper lobe neoplasm. In association with this there is a more central mass in the right upper lobe (axial image 59 of series 2 and coronal image 79 of series 4) measuring 5.0 x 4.0 x 2.3 cm which directly invades the mediastinum and is now completely obscuring the fat plane between the lesion and the adjacent distal trachea and proximal right mainstem bronchus. There is a contour  abnormality in the region of the distal trachea best appreciated on coronal images 80-82 of series 4, such that early direct invasion into the trachea is not excluded. Slight progression of area of septal thickening and nodularity in the left lower lobe, best appreciated on axial image 115 of series 3 where the bulkiest portion  of this region measures approximately 2.1 x 2.1 cm. No acute consolidative airspace disease. No pleural effusions. Diffuse bronchial wall thickening with mild centrilobular and paraseptal emphysema. Upper Abdomen: Status post cholecystectomy. Mild intra and extrahepatic biliary ductal dilatation, similar to prior studies, presumably reflective of benign post cholecystectomy physiology. Aortic atherosclerosis. Severe atrophy of the right kidney with multiple small lesions, most concerning of which is a 1.7 cm intermediate attenuation (43 HU) lesion extending off the posterior aspect of the interpolar region (axial image 179 of series 2), new compared to prior PET-CT. Musculoskeletal: There are no aggressive appearing lytic or blastic lesions noted in the visualized portions of the skeleton. IMPRESSION: 1. Progressive right upper lobe malignancy. Notably, this appears well separated from the site of the regional right upper lobe lesion which was in the anterior aspect of the right upper lobe. On today's study, there are 2 mass-like areas, including the more central of these which directly invades the mediastinum, with potential direct tracheal invasion, as well as progressive right hilar and subcarinal lymphadenopathy. 2. In addition, in the left lower lobe at the site of the previously noted left lower lobe lesion there has been progressive septal thickening and nodularity which has slowly increased over numerous prior examinations. Findings are highly concerning for locally recurrent disease. 3. Aortic atherosclerosis, in addition to 3 vessel coronary artery disease. Assessment for potential  risk factor modification, dietary therapy or pharmacologic therapy may be warranted, if clinically indicated. 4. There are calcifications of the aortic valve. Echocardiographic correlation for evaluation of potential valvular dysfunction may be warranted if clinically indicated. These results will be called to the ordering clinician or representative by the Radiologist Assistant, and communication documented in the PACS or [employer]. Aortic Atherosclerosis (ICD10-I70.0) and Emphysema (ICD10-J43.9). Electronically Signed: By: Vinnie Langton M.D. On: 01/26/2021 10:07     Assessment/Plan 1. Stenosis of left carotid artery Recommend:   Temporal artery biopsy is negative for arteritis and his HA are relieved with two Tylenol.   The patient is symptomatic with respect to the carotid stenosis.  The patient now has progressed and has a lesion the is >80%.   Patient's CT angiography of the carotid arteries confirms >80% left CCA stenosis.  The anatomical considerations support stenting over surgery.  This was discussed in detail with the patient.   The risks, benefits and alternative therapies were reviewed in detail with the patient.  All questions were answered.  The patient agrees to proceed with stenting of the left common carotid artery.   Continue antiplatelet therapy as prescribed add Plavix. Continue management of CAD, HTN and Hyperlipidemia. Healthy heart diet, encouraged exercise at least 4 times per week.     2. Chronic systolic heart failure (HCC) Continue cardiac and antihypertensive medications as already ordered and reviewed, no changes at this time.   Continue statin as ordered and reviewed, no changes at this time   Nitrates PRN for chest pain    3. Essential hypertension Continue antihypertensive medications as already ordered, these medications have been reviewed and there are no changes at this time.    4. 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   Hgb A1C to be monitored as already arranged by primary service    5. Hyperlipidemia associated with type 2 diabetes mellitus (HCC) Continue statin  as ordered and reviewed, no changes at this time    [name], MD  01/30/2021 8:27 AM

## 2021-01-30 NOTE — H&P (Signed)
@LOGO @   MRN : [id number]  Philip MCCLANAHAN is a 75 y.o. (1945/04/23) male who presents with chief complaint of fix the carotid .  History of Present Illness:   The patient is seen for follow up evaluation of carotid stenosis status post CT angiogram. CT scan was done 11/14/2020. Patient reports that the test went well with no problems or complications.    The patient denies interval amaurosis fugax. There is no recent or interval TIA symptoms or focal motor deficits. There is no prior documented CVA.   The patient is taking enteric-coated aspirin 81 mg daily.   There is a history of severe headaches.  And he is status post temporal artery biopsy on 11/14/2020.  Pathology report has returned negative for temporal arteritis.     The patient has a history of coronary artery disease, no recent episodes of angina or shortness of breath. The patient denies PAD or claudication symptoms. There is a history of hyperlipidemia which is being treated with a statin.     CT angiogram is reviewed by me personally and shows 80% stenosis consistent with calcified plaque at the origin of the left common carotid artery.    Current Meds  Medication Sig   acetaminophen (TYLENOL) 500 MG tablet Take 500-1,000 mg by mouth every 6 (six) hours as needed (for pain).   allopurinol (ZYLOPRIM) 100 MG tablet Take 1 tablet (100 mg total) by mouth daily. (Patient taking differently: Take 100 mg by mouth in the morning.)   aspirin EC 81 MG tablet Take 81 mg by mouth in the morning. Swallow whole.   atorvastatin (LIPITOR) 80 MG tablet Take 1 tablet (80 mg total) by mouth daily at 6 PM.   clopidogrel (PLAVIX) 75 MG tablet Take 1 tablet (75 mg total) by mouth daily.   metFORMIN (GLUCOPHAGE) 500 MG tablet Take 1 tablet (500 mg total) by mouth 2 (two) times daily with a meal.   metoprolol succinate (TOPROL XL) 25 MG 24 hr tablet Take 1 tablet (25 mg total) by mouth daily. (Patient taking differently: Take 25 mg by mouth in the  morning.)   mirtazapine (REMERON) 7.5 MG tablet Take 1 tablet (7.5 mg total) by mouth at bedtime.   ramipril (ALTACE) 10 MG capsule Take 1 capsule (10 mg total) by mouth daily. (Patient taking differently: Take 10 mg by mouth in the morning.)   tiotropium (SPIRIVA HANDIHALER) 18 MCG inhalation capsule PLACE 1 CAPSULE INTO INHALER AND INHALE ITS CONTENTS ONCE DAILY. (Patient taking differently: Place 18 mcg into inhaler and inhale in the morning.)   vitamin B-12 (CYANOCOBALAMIN) 1000 MCG tablet Take 1,000 mcg by mouth daily.    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   Chronic heart failure (HCC)    Chronic kidney disease    COPD (chronic obstructive pulmonary disease) (HCC)    Coronary artery disease    Diabetes mellitus without complication (HCC)    GERD (gastroesophageal reflux disease)    Headache    History of kidney stones    Hyperlipidemia    Hypertension    Lumbago    Neuropathy    Tobacco abuse disorder     Past Surgical History:  Procedure Laterality Date   ARTERY BIOPSY Right 11/14/2020   Procedure: BIOPSY TEMPORAL ARTERY;  Surgeon: Katha Cabal, MD;  Location: [facility] ORS;  Service: Vascular;  Laterality: Right;   CHOLECYSTECTOMY  ELECTROMAGNETIC NAVIGATION BROCHOSCOPY N/A 08/19/2017   Procedure: ELECTROMAGNETIC NAVIGATION BRONCHOSCOPY;  Surgeon: Flora Lipps, MD;  Location: [facility] ORS;  Service: Cardiopulmonary;  Laterality: N/A;   ELECTROMAGNETIC NAVIGATION BROCHOSCOPY Left 09/02/2017   Procedure: ELECTROMAGNETIC NAVIGATION BRONCHOSCOPY;  Surgeon: Flora Lipps, MD;  Location: [facility] ORS;  Service: Cardiopulmonary;  Laterality: Left;   THROAT SURGERY      Social History Social History   Tobacco Use   Smoking status: Light Smoker    Packs/day: 0.60    Years: 53.00    Pack years: 31.80    Types: Cigarettes   Smokeless tobacco: Never   Tobacco comments:    Aprrox 10 cigs/day  Vaping Use   Vaping Use:  Never used  Substance Use Topics   Alcohol use: No    Alcohol/week: 0.0 standard drinks   Drug use: No    Family History Family History  Problem Relation Age of Onset   Heart disease Father    Heart attack Father    Brain cancer Sister    Cervical cancer Sister    Rectal cancer Brother    Lung cancer Brother    Lung cancer Brother    Lung cancer Brother    Stomach cancer Sister    Lung cancer Brother    Stomach cancer Sister    Diabetes Sister    Lupus Sister     No Known Allergies   REVIEW OF SYSTEMS (Negative unless checked)  Constitutional: [] Weight loss  [] Fever  [] Chills Cardiac: [] Chest pain   [] Chest pressure   [] Palpitations   [] Shortness of breath when laying flat   [] Shortness of breath with exertion. Vascular:  [] Pain in legs with walking   [] Pain in legs at rest  [] History of DVT   [] Phlebitis   [] Swelling in legs   [] Varicose veins   [] Non-healing ulcers Pulmonary:   [] Uses home oxygen   [] Productive cough   [] Hemoptysis   [] Wheeze  [] COPD   [] Asthma Neurologic:  [] Dizziness   [] Seizures   [] History of stroke   [] History of TIA  [] Aphasia   [] Vissual changes   [] Weakness or numbness in arm   [] Weakness or numbness in leg Musculoskeletal:   [] Joint swelling   [] Joint pain   [] Low back pain Hematologic:  [] Easy bruising  [] Easy bleeding   [] Hypercoagulable state   [] Anemic Gastrointestinal:  [] Diarrhea   [] Vomiting  [] Gastroesophageal reflux/heartburn   [] Difficulty swallowing. Genitourinary:  [] Chronic kidney disease   [] Difficult urination  [] Frequent urination   [] Blood in urine Skin:  [] Rashes   [] Ulcers  Psychological:  [] History of anxiety   []  History of major depression.  Physical Examination  Vitals:   01/30/21 0738  BP: 132/79  Pulse: 76  Resp: 18  Temp: (!) 97.5 F (36.4 C)  TempSrc: Oral  SpO2: 98%  Weight: 76.2 kg  Height: 5\' 9"  (1.753 m)   Body mass index is 24.81 kg/m. Gen: WD/WN, NAD Head: [place]/AT, No temporalis wasting.   Ear/Nose/Throat: Hearing grossly intact, nares w/o erythema or drainage Eyes: PER, EOMI, sclera nonicteric.  Neck: Supple, no masses.  No bruit or JVD.  Pulmonary:  Good air movement, no audible wheezing, no use of accessory muscles.  Cardiac: RRR, normal S1, S2, no Murmurs. Vascular:    bilateral carotid bruit Vessel Right Left  Radial Palpable Palpable  Carotid Palpable Palpable  Gastrointestinal: soft, non-distended. No guarding/no peritoneal signs.  Musculoskeletal: M/S 5/5 throughout.  No visible deformity.  Neurologic: CN 2-12 intact. Pain and light touch intact in extremities.  Symmetrical.  Speech is fluent. Motor exam as listed above. Psychiatric: Judgment intact, Mood & affect appropriate for pt's clinical situation. Dermatologic: No rashes or ulcers noted.  No changes consistent with cellulitis.   CBC Lab Results  Component Value Date   WBC 5.4 11/13/2020   HGB 14.6 11/14/2020   HCT 43.0 11/14/2020   MCV 93.7 11/13/2020   PLT 177 11/13/2020    BMET    Component Value Date/Time   NA 142 11/28/2020 0904   K 5.0 11/28/2020 0904   CL 103 11/28/2020 0904   CO2 25 11/28/2020 0904   GLUCOSE 103 (H) 11/28/2020 0904   GLUCOSE 115 (H) 11/14/2020 0753   BUN 19 01/30/2021 0740   BUN 13 11/28/2020 0904   CREATININE 0.93 01/30/2021 0740   CALCIUM 9.5 11/28/2020 0904   GFRNONAA >60 01/30/2021 0740   GFRAA 72 05/31/2020 0904   Estimated Creatinine Clearance: 68.6 mL/min (by C-G formula based on SCr of 0.93 mg/dL).  COAG No results found for: INR, PROTIME  Radiology CT Chest W Contrast  Addendum Date: 01/26/2021   ADDENDUM REPORT: 01/26/2021 12:24 ADDENDUM: As mentioned in the body of the report there is a 1.7 cm lesion in the posterior aspect of the interpolar region of the right kidney, suspicious for small neoplasm, either a renal cell carcinoma or metastatic lesion of the kidney. This could be further evaluated with nonemergent abdominal MRI with and without IV  gadolinium if clinically appropriate. Electronically Signed   By: Vinnie Langton M.D.   On: 01/26/2021 12:24   Result Date: 01/26/2021 CLINICAL DATA:  75 year old male with history of non-small cell lung cancer treated with radiation therapy presenting with 1 week of hemoptysis and cough. Follow-up study. EXAM: CT CHEST WITH CONTRAST TECHNIQUE: Multidetector CT imaging of the chest was performed during intravenous contrast administration. CONTRAST:  52mL OMNIPAQUE IOHEXOL 350 MG/ML SOLN COMPARISON:  CT of the chest 07/30/2020. FINDINGS: Cardiovascular: Heart size is normal. There is no significant pericardial fluid, thickening or pericardial calcification. There is aortic atherosclerosis, as well as atherosclerosis of the great vessels of the mediastinum and the coronary arteries, including calcified atherosclerotic plaque in the left anterior descending, left circumflex and right coronary arteries. Thickening calcification of the aortic valve. Mild calcification of the mitral annulus. Mediastinum/Nodes: Enlarging subcarinal (axial image 72 of series 2 measuring 1.7 cm in short axis) and right hilar (axial image 71 of series 2) measuring 1.7 cm) lymph nodes, highly concerning for metastatic disease. No left hilar lymphadenopathy. Esophagus is unremarkable in appearance. No axillary lymphadenopathy. Lungs/Pleura: Definitive enlargement of a mass-like area with convex margins in the right upper lobe (axial image 50 of series 2 and coronal image 85 of series 4) which currently measures 2.0 x 4.3 x 2.5 cm, compatible with progressive right upper lobe neoplasm. In association with this there is a more central mass in the right upper lobe (axial image 59 of series 2 and coronal image 79 of series 4) measuring 5.0 x 4.0 x 2.3 cm which directly invades the mediastinum and is now completely obscuring the fat plane between the lesion and the adjacent distal trachea and proximal right mainstem bronchus. There is a contour  abnormality in the region of the distal trachea best appreciated on coronal images 80-82 of series 4, such that early direct invasion into the trachea is not excluded. Slight progression of area of septal thickening and nodularity in the left lower lobe, best appreciated on axial image 115 of series 3 where the  bulkiest portion of this region measures approximately 2.1 x 2.1 cm. No acute consolidative airspace disease. No pleural effusions. Diffuse bronchial wall thickening with mild centrilobular and paraseptal emphysema. Upper Abdomen: Status post cholecystectomy. Mild intra and extrahepatic biliary ductal dilatation, similar to prior studies, presumably reflective of benign post cholecystectomy physiology. Aortic atherosclerosis. Severe atrophy of the right kidney with multiple small lesions, most concerning of which is a 1.7 cm intermediate attenuation (43 HU) lesion extending off the posterior aspect of the interpolar region (axial image 179 of series 2), new compared to prior PET-CT. Musculoskeletal: There are no aggressive appearing lytic or blastic lesions noted in the visualized portions of the skeleton. IMPRESSION: 1. Progressive right upper lobe malignancy. Notably, this appears well separated from the site of the regional right upper lobe lesion which was in the anterior aspect of the right upper lobe. On today's study, there are 2 mass-like areas, including the more central of these which directly invades the mediastinum, with potential direct tracheal invasion, as well as progressive right hilar and subcarinal lymphadenopathy. 2. In addition, in the left lower lobe at the site of the previously noted left lower lobe lesion there has been progressive septal thickening and nodularity which has slowly increased over numerous prior examinations. Findings are highly concerning for locally recurrent disease. 3. Aortic atherosclerosis, in addition to 3 vessel coronary artery disease. Assessment for potential  risk factor modification, dietary therapy or pharmacologic therapy may be warranted, if clinically indicated. 4. There are calcifications of the aortic valve. Echocardiographic correlation for evaluation of potential valvular dysfunction may be warranted if clinically indicated. These results will be called to the ordering clinician or representative by the Radiologist Assistant, and communication documented in the PACS or [employer]. Aortic Atherosclerosis (ICD10-I70.0) and Emphysema (ICD10-J43.9). Electronically Signed: By: Vinnie Langton M.D. On: 01/26/2021 10:07     Assessment/Plan 1. Stenosis of left carotid artery Recommend:   Temporal artery biopsy is negative for arteritis and his HA are relieved with two Tylenol.   The patient is symptomatic with respect to the carotid stenosis.  The patient now has progressed and has a lesion the is >80%.   Patient's CT angiography of the carotid arteries confirms >80% left CCA stenosis.  The anatomical considerations support stenting over surgery.  This was discussed in detail with the patient.   The risks, benefits and alternative therapies were reviewed in detail with the patient.  All questions were answered.  The patient agrees to proceed with stenting of the left common carotid artery.   Continue antiplatelet therapy as prescribed add Plavix. Continue management of CAD, HTN and Hyperlipidemia. Healthy heart diet, encouraged exercise at least 4 times per week.     2. Chronic systolic heart failure (HCC) Continue cardiac and antihypertensive medications as already ordered and reviewed, no changes at this time.   Continue statin as ordered and reviewed, no changes at this time   Nitrates PRN for chest pain    3. Essential hypertension Continue antihypertensive medications as already ordered, these medications have been reviewed and there are no changes at this time.    4. 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   Hgb A1C to be monitored as already arranged by primary service    5. Hyperlipidemia associated with type 2 diabetes mellitus ([place])  Continue statin as ordered and reviewed, no changes at this time    [name], MD  01/30/2021 8:32 AM

## 2021-01-30 NOTE — Plan of Care (Signed)
  Problem: Education: Goal: Knowledge of General Education information will improve Description: Including pain rating scale, medication(s)/side effects and non-pharmacologic comfort measures 01/30/2021 1833 by Janit Bern, RN Outcome: Progressing 01/30/2021 1707 by Janit Bern, RN Outcome: Progressing   Problem: Health Behavior/Discharge Planning: Goal: Ability to manage health-related needs will improve 01/30/2021 1833 by Janit Bern, RN Outcome: Progressing 01/30/2021 1707 by Janit Bern, RN Outcome: Progressing   Problem: Clinical Measurements: Goal: Ability to maintain clinical measurements within normal limits will improve 01/30/2021 1833 by Janit Bern, RN Outcome: Progressing 01/30/2021 1707 by Janit Bern, RN Outcome: Progressing Goal: Will remain free from infection 01/30/2021 1833 by Janit Bern, RN Outcome: Progressing 01/30/2021 1707 by Janit Bern, RN Outcome: Progressing Goal: Diagnostic test results will improve 01/30/2021 1833 by Janit Bern, RN Outcome: Progressing 01/30/2021 1707 by Janit Bern, RN Outcome: Progressing Goal: Respiratory complications will improve 01/30/2021 1833 by Janit Bern, RN Outcome: Progressing 01/30/2021 1707 by Janit Bern, RN Outcome: Progressing Goal: Cardiovascular complication will be avoided 01/30/2021 1833 by Janit Bern, RN Outcome: Progressing 01/30/2021 1707 by Janit Bern, RN Outcome: Progressing   Problem: Activity: Goal: Risk for activity intolerance will decrease 01/30/2021 1833 by Janit Bern, RN Outcome: Progressing 01/30/2021 1707 by Janit Bern, RN Outcome: Progressing   Problem: Nutrition: Goal: Adequate nutrition will be maintained 01/30/2021 1833 by Janit Bern, RN Outcome: Progressing 01/30/2021 1707 by Janit Bern, RN Outcome: Progressing   Problem: Coping: Goal: Level of anxiety will  decrease 01/30/2021 1833 by Janit Bern, RN Outcome: Progressing 01/30/2021 1707 by Janit Bern, RN Outcome: Progressing   Problem: Elimination: Goal: Will not experience complications related to bowel motility 01/30/2021 1833 by Janit Bern, RN Outcome: Progressing 01/30/2021 1707 by Janit Bern, RN Outcome: Progressing Goal: Will not experience complications related to urinary retention 01/30/2021 1833 by Janit Bern, RN Outcome: Progressing 01/30/2021 1707 by Janit Bern, RN Outcome: Progressing   Problem: Pain Managment: Goal: General experience of comfort will improve 01/30/2021 1833 by Janit Bern, RN Outcome: Progressing 01/30/2021 1707 by Janit Bern, RN Outcome: Progressing   Problem: Safety: Goal: Ability to remain free from injury will improve 01/30/2021 1833 by Janit Bern, RN Outcome: Progressing 01/30/2021 1707 by Janit Bern, RN Outcome: Progressing   Problem: Skin Integrity: Goal: Risk for impaired skin integrity will decrease 01/30/2021 1833 by Janit Bern, RN Outcome: Progressing 01/30/2021 1707 by Janit Bern, RN Outcome: Progressing

## 2021-01-30 NOTE — Plan of Care (Signed)
Patient arrived to ICU post L carotid stent; alert and oriented x4, neurologically intact. VS WN, SR and on RA. R femoral site is intact; has 72mL of air in PAD. PIV's patent and R wrist PIV is infusing NS-KCL at 75 mL/her. Patient and family updated on plan of care. Will continue with q1hr neuro checks until 2pm, then q4hr. Patient has had lunch and is now resting and in no distress.   Problem: Education: Goal: Knowledge of General Education information will improve Description: Including pain rating scale, medication(s)/side effects and non-pharmacologic comfort measures Outcome: Progressing   Problem: Health Behavior/Discharge Planning: Goal: Ability to manage health-related needs will improve Outcome: Progressing   Problem: Clinical Measurements: Goal: Ability to maintain clinical measurements within normal limits will improve Outcome: Progressing Goal: Will remain free from infection Outcome: Progressing Goal: Diagnostic test results will improve Outcome: Progressing Goal: Respiratory complications will improve Outcome: Progressing Goal: Cardiovascular complication will be avoided Outcome: Progressing   Problem: Activity: Goal: Risk for activity intolerance will decrease Outcome: Progressing   Problem: Nutrition: Goal: Adequate nutrition will be maintained Outcome: Progressing   Problem: Coping: Goal: Level of anxiety will decrease Outcome: Progressing   Problem: Coping: Goal: Level of anxiety will decrease Outcome: Progressing   Problem: Elimination: Goal: Will not experience complications related to bowel motility Outcome: Progressing Goal: Will not experience complications related to urinary retention Outcome: Progressing   Problem: Pain Managment: Goal: General experience of comfort will improve Outcome: Progressing   Problem: Safety: Goal: Ability to remain free from injury will improve Outcome: Progressing   Problem: Skin Integrity: Goal: Risk for  impaired skin integrity will decrease Outcome: Progressing

## 2021-01-30 NOTE — Op Note (Signed)
OPERATIVE NOTE DATE: 01/30/2021  PROCEDURE:  Ultrasound guidance for vascular access right femoral artery  Placement of a 10 mm x 30 mm exact stent with the use of the NAV-6 embolic protection device in the left common carotid artery  PRE-OPERATIVE DIAGNOSIS: 1.  Greater than 80% carotid artery stenosis. 2.  Amaurosis fugax  POST-OPERATIVE DIAGNOSIS:  Same as above  SURGEON: [name]  ASSISTANT(S): None  ANESTHESIA: local/MCS  ESTIMATED BLOOD LOSS: 75 cc  CONTRAST: 60 cc  FLUORO TIME: 9.3 minutes  MODERATE CONSCIOUS SEDATION TIME: Continuous ECG pulse oximetry and cardiopulmonary monitoring was performed throughout the entire procedure by the interventional radiology nurse total sedation time was 61 minutes  FINDING(S): 1.   80% left common carotid artery stenosis with 40 to 50% origin of the left internal carotid artery stenosis  SPECIMEN(S):   none  INDICATIONS:   Patient is a 75 y.o. male who presents with left carotid artery stenosis.  The patient has had visual changes of the left eye he actually underwent temporal artery biopsy for this symptom.  The lesion is actually low in the common carotid artery on the left and would be exceedingly difficult to treat using open surgical techniques and carotid artery stenting was felt to be preferred to endarterectomy for that reason.  Risks and benefits were discussed and informed consent was obtained.   DESCRIPTION: After obtaining full informed written consent, the patient was brought back to the vascular suite and placed supine upon the table.  The patient received IV antibiotics prior to induction. Moderate conscious sedation was administered during a face to face encounter with the patient throughout the procedure with my supervision of the RN administering medicines and monitoring the patients vital signs and mental status throughout from the start of the procedure until the patient was taken to the recovery room.     After obtaining adequate sedation, the patient was prepped and draped in the standard fashion.    The right femoral artery was visualized with ultrasound and found to be widely patent. It was then accessed under direct ultrasound guidance without difficulty with a micropuncture needle. A permanent image was recorded.  A microwire was then advanced without difficulty under fluoroscopic guidance followed by a micro-sheath.  A J-wire was placed and we then placed a 6 French sheath. The patient was then heparinized and a total of 10,000 units of intravenous heparin were given and an ACT was checked to confirm successful anticoagulation.   A pigtail catheter was then placed into the ascending aorta. This showed type II arch no evidence of ostial stenosis of the great vessels. The left common carotid artery was then selectively cannulated without difficulty with a Simmons 1 catheter and the catheter advanced into the mid left common carotid artery.  Cervical and cerebral carotid angiography was then performed. There were no obvious intracranial filling defects. The carotid bifurcation demonstrated 40 to 50% stenosis of the origin of the internal carotid artery on the left.  I then advanced the [facility] 1 catheter into the external carotid artery, stiff angled Glidewire was then exchanged for the Amplatz Super Stiff wire. Over the Amplatz Super Stiff wire, a 6 French penumbra Nuromax sheath was placed into the proximal left common carotid artery. I then used the NAV-6  Embolic protection device and crossed the lesion and parked this in the distal internal carotid artery at the base of the skull.  I then selected a 10 mm x 30 mm exact stent. This was  deployed across the lesion encompassing it in its entirety. A 5.5 mm x 20 mm length balloon was used to post dilate the stent. Only about a 10% residual stenosis was present after angioplasty. Completion angiogram showed normal intracranial filling without new defects.  At this point I elected to terminate the procedure. The sheath was removed and StarClose closure device was deployed in the right femoral artery with excellent hemostatic result. The patient was taken to the recovery room in stable condition having tolerated the procedure well.  COMPLICATIONS: none  CONDITION: stable  [name] 01/30/2021 10:06 AM   This note was created with Dragon Medical transcription system. Any errors in dictation are purely unintentional.

## 2021-01-30 NOTE — Interval H&P Note (Signed)
History and Physical Interval Note:  01/30/2021 8:37 AM  Philip Wood  has presented today for surgery, with the diagnosis of LT Carotid Stent   ABBOTT   Carotid Artery Stenosis  Dr Delana Meyer w Dr Lucky Cowboy to assist.  The various methods of treatment have been discussed with the patient and family. After consideration of risks, benefits and other options for treatment, the patient has consented to  Procedure(s): CAROTID PTA/STENT INTERVENTION (Left) as a surgical intervention.  The patient's history has been reviewed, patient examined, no change in status, stable for surgery.  I have reviewed the patient's chart and labs.  Questions were answered to the patient's satisfaction.     [name]

## 2021-01-30 NOTE — Plan of Care (Signed)

## 2021-01-31 ENCOUNTER — Ambulatory Visit: Payer: Medicare Other | Admitting: Radiation Oncology

## 2021-01-31 DIAGNOSIS — I6522 Occlusion and stenosis of left carotid artery: Principal | ICD-10-CM

## 2021-01-31 LAB — CBC
HCT: 32.5 % — ABNORMAL LOW (ref 39.0–52.0)
Hemoglobin: 10.9 g/dL — ABNORMAL LOW (ref 13.0–17.0)
MCH: 31.8 pg (ref 26.0–34.0)
MCHC: 33.5 g/dL (ref 30.0–36.0)
MCV: 94.8 fL (ref 80.0–100.0)
Platelets: 159 10*3/uL (ref 150–400)
RBC: 3.43 MIL/uL — ABNORMAL LOW (ref 4.22–5.81)
RDW: 12.6 % (ref 11.5–15.5)
WBC: 6.4 10*3/uL (ref 4.0–10.5)
nRBC: 0 % (ref 0.0–0.2)

## 2021-01-31 LAB — BASIC METABOLIC PANEL
Anion gap: 5 (ref 5–15)
BUN: 17 mg/dL (ref 8–23)
CO2: 28 mmol/L (ref 22–32)
Calcium: 8.5 mg/dL — ABNORMAL LOW (ref 8.9–10.3)
Chloride: 104 mmol/L (ref 98–111)
Creatinine, Ser: 0.85 mg/dL (ref 0.61–1.24)
GFR, Estimated: >60 mL/min (ref >60)
Glucose, Bld: 109 mg/dL — ABNORMAL HIGH (ref 70–99)
Potassium: 4.4 mmol/L (ref 3.5–5.1)
Sodium: 137 mmol/L (ref 135–145)

## 2021-01-31 LAB — GLUCOSE, CAPILLARY
Glucose-Capillary: 82 mg/dL (ref 70–99)
Glucose-Capillary: 114 mg/dL — ABNORMAL HIGH (ref 70–99)

## 2021-01-31 MED ORDER — PANTOPRAZOLE SODIUM 40 MG PO TBEC: 40.0000 mg | DELAYED_RELEASE_TABLET | Freq: Every day | ORAL | Status: DC

## 2021-01-31 NOTE — Progress Notes (Signed)
PHARMACIST - PHYSICIAN COMMUNICATION  CONCERNING: IV to Oral Route Change Policy  RECOMMENDATION: This patient is receiving pantoprazole by the intravenous route.  Based on criteria approved by the Pharmacy and Therapeutics Committee, the intravenous medication(s) is/are being converted to the equivalent oral dose form(s).   DESCRIPTION: These criteria include: The patient is eating (either orally or via tube) and/or has been taking other orally administered medications for a least 24 hours The patient has no evidence of active gastrointestinal bleeding or impaired GI absorption (gastrectomy, short bowel, patient on TNA or NPO).  If you have questions about this conversion, please contact the [place], [employer] [facility] 01/31/2021 8:54 AM

## 2021-01-31 NOTE — Progress Notes (Addendum)
Nutrition Brief Note  Patient identified on the Malnutrition Screening Tool (MST) Report  75 y/o male with h/o lung cancer, CHF, DM, HLD, HTN and CKD III who is admitted with carotid stenosis now s/p R carotid stent placement 10/12.   Met with pt in room today. Pt reports good appetite and oral intake pta and in hospital. Pt reports eating 100% of his breakfast this morning and reports he is anxious to go home.   Wt Readings from Last 15 Encounters:  01/30/21 76.6 kg  01/28/21 76.6 kg  01/08/21 77.7 kg  01/03/21 78.7 kg  11/29/20 80.6 kg  11/28/20 80.3 kg  11/14/20 80.7 kg  11/13/20 80.7 kg  11/02/20 80.7 kg  11/01/20 81.1 kg  10/24/20 82.3 kg  10/15/20 82.2 kg  10/08/20 80.8 kg  08/31/20 81.6 kg  08/01/20 83 kg    Body mass index is 24.94 kg/m. Patient meets criteria for normal weight based on current BMI. Per chart, pt is down 16lbs(9%) over the past year; this is not significant.   Nutrition Focused Physical Exam   Flowsheet Row Most Recent Value  Orbital Region No depletion  Upper Arm Region No depletion  Thoracic and Lumbar Region Mild depletion  Buccal Region No depletion  Temple Region Mild depletion  Clavicle Bone Region Mild depletion  Clavicle and Acromion Bone Region Mild depletion  Scapular Bone Region No depletion  Dorsal Hand Moderate depletion  Patellar Region Moderate depletion  Anterior Thigh Region Moderate depletion  Posterior Calf Region Moderate depletion  Edema (RD Assessment) None  Hair Reviewed  Eyes Reviewed  Mouth Reviewed  Skin Reviewed  Nails Reviewed   Current diet order is CHO modified, patient is consuming approximately 100% of meals at this time. Labs and medications reviewed.   Pt declines ONS and all nutrition interventions at this time. Pt to discharge today.   No nutrition interventions warranted at this time. If nutrition issues arise, please consult RD.   Koleen Distance MS, RD, LDN Please refer to [facility] for RD and/or RD  on-call/weekend/after hours pager

## 2021-01-31 NOTE — Discharge Instructions (Signed)
Vascular Surgery Discharge Instructions:  1) You may shower.  Gently clean your groins with soap and water.  Gently pat dry. 2) Please do not drive for approximately 2 weeks 3) Please do not engage in strenuous activity or lifting greater than 10 pounds until you are cleared at your first postoperative follow-up

## 2021-01-31 NOTE — Discharge Summary (Signed)
[facility]    Discharge Summary  Patient ID:  Philip Wood MRN: [id number] DOB/AGE: 09/25/1945 75 y.o.  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   Chronic heart failure (HCC)    Chronic kidney disease    COPD (chronic obstructive pulmonary disease) (HCC)    Coronary artery disease    Diabetes mellitus without complication (HCC)    GERD (gastroesophageal reflux disease)    Headache    History of kidney stones    Hyperlipidemia    Hypertension    Lumbago    Neuropathy    Tobacco abuse disorder    Procedure(s): 01/30/21:  Ultrasound guidance for vascular access right femoral artery  Placement of a 10 mm x 30 mm exact stent with the use of the NAV-6 embolic protection device in the left common carotid artery  Discharged Condition: Good  HPI / Hospital Course:  Patient is a 75 year old male who presents with left carotid artery stenosis. The patient has had visual changes of the left eye he actually underwent temporal artery biopsy for this symptom. The lesion is actually low in the common carotid artery on the left and would be exceedingly difficult to treat using open surgical techniques and carotid artery stenting was felt to be preferred to endarterectomy for that reason.  Risks and benefits were discussed and informed consent was obtained. On 01/30/21, the patient underwent:   Ultrasound guidance for vascular access right femoral artery  Placement of a 10 mm x 30 mm exact stent with the use of the NAV-6 embolic protection device in the left common  carotid artery  He tolerated the procedure and was transferred from the angiography suite to the ICU for observation overnight.  Night of surgery was unremarkable. During his brief stay, his diet was advanced, his pain was controlled to the use of p.o. pain medication, he was urinating independently he was ambulating at baseline.  Day of discharge, patient was afebrile with essentially unremarkable physical exam.  Physical Exam:  Alert and oriented x3, no acute distress Face: Symmetrical, tongue midline Neck: Trachea midline, no swelling or ecchymosis Cardiovascular: Regular rate and rhythm Pulmonary: Clear to auscultation bilaterally Abdomen: Soft, nontender, nondistended Right groin:  Access site: Clean dry and intact GU: Making clear urine in urinal Extremity: Warm distally to toes.  Minimal edema Neuro: Intact.  No deficits.  Labs: As below  Complications: None  Consults: None  Significant Diagnostic Studies: CBC Lab Results  Component Value Date   WBC 6.4 01/31/2021   HGB 10.9 (L) 01/31/2021   HCT 32.5 (L) 01/31/2021   MCV 94.8 01/31/2021   PLT 159 01/31/2021   BMET    Component Value Date/Time   NA 137 01/31/2021 0738   NA 142 11/28/2020 0904   K 4.4 01/31/2021 0738   CL 104 01/31/2021 0738   CO2 28 01/31/2021 0738   GLUCOSE 109 (H) 01/31/2021 0738   BUN 17 01/31/2021 0738   BUN 13 11/28/2020 0904   CREATININE 0.85 01/31/2021 0738   CALCIUM 8.5 (L) 01/31/2021 0738   GFRNONAA >60 01/31/2021 0738   GFRAA 72 05/31/2020 0904  COAG No results found for: INR, PROTIME  Disposition:  Discharge to :Home  Allergies as of 01/31/2021   No Known Allergies      Medication List     TAKE these medications    acetaminophen 500 MG tablet Commonly known as: TYLENOL Take 500-1,000 mg by mouth every 6 (six) hours as needed (for pain).   allopurinol 100 MG tablet Commonly known as: ZYLOPRIM Take 1 tablet (100 mg total) by mouth daily. What changed: when to  take this   aspirin EC 81 MG tablet Take 81 mg by mouth in the morning. Swallow whole.   atorvastatin 80 MG tablet Commonly known as: LIPITOR Take 1 tablet (80 mg total) by mouth daily at 6 PM.   clopidogrel 75 MG tablet Commonly known as: PLAVIX Take 1 tablet (75 mg total) by mouth daily.   metFORMIN 500 MG tablet Commonly known as: GLUCOPHAGE Take 1 tablet (500 mg total) by mouth 2 (two) times daily with a meal.   metoprolol succinate 25 MG 24 hr tablet Commonly known as: Toprol XL Take 1 tablet (25 mg total) by mouth daily. What changed: when to take this   mirtazapine 7.5 MG tablet Commonly known as: REMERON Take 1 tablet (7.5 mg total) by mouth at bedtime.   Moderna COVID-19 Vaccine 100 MCG/0.5ML injection Generic drug: COVID-19 mRNA vaccine (Moderna)   ramipril 10 MG capsule Commonly known as: ALTACE Take 1 capsule (10 mg total) by mouth daily. What changed: when to take this   Spiriva HandiHaler 18 MCG inhalation capsule Generic drug: tiotropium PLACE 1 CAPSULE INTO INHALER AND INHALE ITS CONTENTS ONCE DAILY. What changed:  how much to take how to take this when to take this additional instructions   vitamin B-12 1000 MCG tablet Commonly known as: CYANOCOBALAMIN Take 1,000 mcg by mouth daily.       Verbal and written Discharge instructions given to the patient. Wound care per Discharge AVS  Follow-up Information     Schnier, Dolores Lory, MD Follow up in 1 month(s).   Specialties: Vascular Surgery, Cardiology, Radiology, Vascular Surgery Why: First post-op check. Will need carotid with visit. Contact information: [place] [address] [phone]                Signed: Sela Hua, PA-C 01/31/2021, 12:51 PM

## 2021-02-05 ENCOUNTER — Ambulatory Visit (INDEPENDENT_AMBULATORY_CARE_PROVIDER_SITE_OTHER): Payer: Medicare Other | Admitting: Nurse Practitioner

## 2021-02-05 ENCOUNTER — Other Ambulatory Visit: Payer: Self-pay

## 2021-02-05 ENCOUNTER — Encounter: Payer: Self-pay | Admitting: Nurse Practitioner

## 2021-02-05 VITALS — BP 122/74 | HR 81 | Temp 98.3°F | Wt 171.8 lb

## 2021-02-05 DIAGNOSIS — I6522 Occlusion and stenosis of left carotid artery: Secondary | ICD-10-CM | POA: Diagnosis not present

## 2021-02-05 DIAGNOSIS — F4321 Adjustment disorder with depressed mood: Secondary | ICD-10-CM | POA: Diagnosis not present

## 2021-02-05 NOTE — Assessment & Plan Note (Signed)
Recent stent placement to left -- still having some mild discomfort and swelling, recommend he continue to rest, wear supportive underwear, take Tylenol as needed, + may use ice to area if needed.  To contact vascular if any worsening pain or edema presents.

## 2021-02-05 NOTE — Progress Notes (Signed)
BP 122/74   Pulse 81   Temp 98.3 F (36.8 C) (Oral)   Wt 171 lb 12.8 oz (77.9 kg)   SpO2 98%   BMI 25.37 kg/m    Subjective:    Patient ID: Philip Wood, male    DOB: [birth year], 75 y.o.   MRN: [id number]  HPI: Philip Wood is a 76 y.o. male  Chief Complaint  Patient presents with   Greiving    4 week f.up   CAD WITH STENOSIS Is currently being followed by vascular for carotid stenosis and had stent placement to left carotid on 10/12/2.  On dual with Plavix and ASA at this time.  He does report still being sore and having some swelling -- is taking Tylenol.   Medication compliance: good compliance Aspirin: yes Recent stressors: no Recurrent headaches: no Visual changes: no Palpitations: no Dyspnea: baseline, no worse Chest pain: no Lower extremity edema: no Dizzy/lightheaded: no   GRIEVING Last visit started Mirtazapine 7.5 MG nightly for appetite and mood on 01/08/21 due to recent loss of wife -- had less appetite after he loss.  Reports appetite this time is "so so".  Has gained a couple pounds since recent visit.  Eats about 2 meals a day at baseline -- morning scrambled eggs, grits, sausage + coffee & dinner has vegetables and meat.   Mood status: stable Satisfied with current treatment?: yes Symptom severity: mild  Duration of current treatment : months Side effects: no Medication compliance: good compliance Psychotherapy/counseling: none Previous psychiatric medications: none Depressed mood: no Anxious mood: no Anhedonia: no Significant weight loss or gain: no Insomnia: none Fatigue: no Feelings of worthlessness or guilt: no Impaired concentration/indecisiveness: no Suicidal ideations: no Hopelessness: no Crying spells: no Depression screen [facility] 2/9 01/08/2021 08/31/2020 05/31/2020 11/29/2019 09/20/2018  Decreased Interest 0 0 0 0 0  Down, Depressed, Hopeless 0 0 0 0 0  PHQ - 2 Score 0 0 0 0 0  Altered sleeping 0 - 0 - -  Tired, decreased energy 0  - 0 - -  Change in appetite 2 - 0 - -  Feeling bad or failure about yourself  0 - 0 - -  Trouble concentrating 0 - 0 - -  Moving slowly or fidgety/restless 0 - 0 - -  Suicidal thoughts 0 - 0 - -  PHQ-9 Score 2 - 0 - -  Difficult doing work/chores Somewhat difficult - - - -    Relevant past medical, surgical, family and social history reviewed and updated as indicated. Interim medical history since our last visit reviewed. Allergies and medications reviewed and updated.  Review of Systems  Constitutional:  Negative for activity change, diaphoresis, fatigue and fever.  Respiratory:  Negative for cough, chest tightness, shortness of breath and wheezing.   Cardiovascular:  Negative for chest pain, palpitations and leg swelling.  Gastrointestinal: Negative.   Endocrine: Negative for cold intolerance, heat intolerance, polydipsia, polyphagia and polyuria.  Neurological: Negative.   Psychiatric/Behavioral: Negative.     Per HPI unless specifically indicated above     Objective:    BP 122/74   Pulse 81   Temp 98.3 F (36.8 C) (Oral)   Wt 171 lb 12.8 oz (77.9 kg)   SpO2 98%   BMI 25.37 kg/m   Wt Readings from Last 3 Encounters:  02/05/21 171 lb 12.8 oz (77.9 kg)  01/30/21 168 lb 14 oz (76.6 kg)  01/28/21 168 lb 12.8 oz (76.6 kg)    Physical Exam  Vitals and nursing note reviewed.  Constitutional:      General: He is awake. He is not in acute distress.    Appearance: He is well-developed and well-groomed. He is not ill-appearing or toxic-appearing.  HENT:     Head: Normocephalic and atraumatic.     Right Ear: Hearing, ear canal and external ear normal.     Left Ear: Hearing, ear canal and external ear normal.     Nose:     Right Sinus: No maxillary sinus tenderness or frontal sinus tenderness.     Left Sinus: No maxillary sinus tenderness or frontal sinus tenderness.  Eyes:     General: Lids are normal.        Right eye: No discharge.        Left eye: No discharge.      Extraocular Movements: Extraocular movements intact.     Conjunctiva/sclera: Conjunctivae normal.     Pupils: Pupils are equal, round, and reactive to light.     Visual Fields: Right eye visual fields normal and left eye visual fields normal.  Neck:     Thyroid: No thyromegaly.     Vascular: No carotid bruit.  Cardiovascular:     Rate and Rhythm: Normal rate and regular rhythm.     Heart sounds: Normal heart sounds, S1 normal and S2 normal. No murmur heard.   No gallop.  Pulmonary:     Effort: Pulmonary effort is normal. No accessory muscle usage or respiratory distress.     Breath sounds: Decreased breath sounds and wheezing present.     Comments: Decreased sounds throughout with expiratory wheezes per baseline. Abdominal:     General: Bowel sounds are normal.     Palpations: Abdomen is soft.  Musculoskeletal:        General: Normal range of motion.     Cervical back: Normal range of motion and neck supple.     Right lower leg: No edema.     Left lower leg: No edema.  Lymphadenopathy:     Cervical: No cervical adenopathy.  Skin:    General: Skin is warm and dry.  Neurological:     Mental Status: He is alert and oriented to person, place, and time.     Cranial Nerves: Cranial nerves are intact.     Motor: Motor function is intact.     Coordination: Coordination is intact.     Gait: Gait is intact.     Deep Tendon Reflexes: Reflexes are normal and symmetric.     Reflex Scores:      Brachioradialis reflexes are 2+ on the right side and 2+ on the left side.      Patellar reflexes are 2+ on the right side and 2+ on the left side. Psychiatric:        Attention and Perception: Attention normal.        Mood and Affect: Mood normal.        Speech: Speech normal.        Behavior: Behavior normal. Behavior is cooperative.        Thought Content: Thought content normal.    Results for orders placed or performed during the hospital encounter of 01/30/21  MRSA Next Gen by PCR, Nasal    Specimen: Nasal Mucosa; Nasal Swab  Result Value Ref Range   MRSA by PCR Next Gen NOT DETECTED NOT DETECTED  BUN  Result Value Ref Range   BUN 19 8 - 23 mg/dL  Creatinine, serum  Result Value  Ref Range   Creatinine, Ser 0.93 0.61 - 1.24 mg/dL   GFR, Estimated >60 >60 mL/min  Glucose, capillary  Result Value Ref Range   Glucose-Capillary 108 (H) 70 - 99 mg/dL  Hemoglobin A1c  Result Value Ref Range   Hgb A1c MFr Bld 6.0 (H) 4.8 - 5.6 %   Mean Plasma Glucose 126 mg/dL  Glucose, capillary  Result Value Ref Range   Glucose-Capillary 97 70 - 99 mg/dL  Glucose, capillary  Result Value Ref Range   Glucose-Capillary 125 (H) 70 - 99 mg/dL  CBC  Result Value Ref Range   WBC 6.4 4.0 - 10.5 K/uL   RBC 3.43 (L) 4.22 - 5.81 MIL/uL   Hemoglobin 10.9 (L) 13.0 - 17.0 g/dL   HCT 32.5 (L) 39.0 - 52.0 %   MCV 94.8 80.0 - 100.0 fL   MCH 31.8 26.0 - 34.0 pg   MCHC 33.5 30.0 - 36.0 g/dL   RDW 12.6 11.5 - 15.5 %   Platelets 159 150 - 400 K/uL   nRBC 0.0 0.0 - 0.2 %  Basic metabolic panel  Result Value Ref Range   Sodium 137 135 - 145 mmol/L   Potassium 4.4 3.5 - 5.1 mmol/L   Chloride 104 98 - 111 mmol/L   CO2 28 22 - 32 mmol/L   Glucose, Bld 109 (H) 70 - 99 mg/dL   BUN 17 8 - 23 mg/dL   Creatinine, Ser 0.85 0.61 - 1.24 mg/dL   Calcium 8.5 (L) 8.9 - 10.3 mg/dL   GFR, Estimated >60 >60 mL/min   Anion gap 5 5 - 15  Glucose, capillary  Result Value Ref Range   Glucose-Capillary 116 (H) 70 - 99 mg/dL  Glucose, capillary  Result Value Ref Range   Glucose-Capillary 82 70 - 99 mg/dL  Glucose, capillary  Result Value Ref Range   Glucose-Capillary 114 (H) 70 - 99 mg/dL  POCT Activated clotting time  Result Value Ref Range   Activated Clotting Time 254 seconds      Assessment & Plan:   Problem List Items Addressed This Visit       Cardiovascular and Mediastinum   Carotid stenosis - Primary    Recent stent placement to left -- still having some mild discomfort and swelling,  recommend he continue to rest, wear supportive underwear, take Tylenol as needed, + may use ice to area if needed.  To contact vascular if any worsening pain or edema presents.      Relevant Orders   AMB Referral to [facility] Coordinaton     Other   Grieving    Recent loss of wife over past 3 weeks, at this time is maintaining weight with Mirtazapine on board. Will continue Mirtazapine 7.5 MG nightly, educated him on this medication and use for mood and appetite.  Recommend he drink 1-2 Glucerna daily for protein.  Plan to have return in 4 weeks for follow-up, recommend he reach out to hospice for support group.  If ongoing loss consider imaging and labs.  CCM referral to nurse case manager today for spiritual support.      Relevant Orders   AMB Referral to [facility] Coordinaton     Follow up plan: Return in about 4 weeks (around 03/05/2021) for Grieving .

## 2021-02-05 NOTE — Patient Instructions (Signed)
Managing Loss, Adult People experience loss in many different ways throughout their lives. Events such as moving, changing jobs, and losing friends can create a sense of loss. The loss may be as serious as a major health change, divorce, death of a pet, or death of a loved one. All of these types of loss are likely to create a physical and emotional reaction known as grief. Grief is the result of a major change or an absence of something or someone that you count on. Grief is a normal reaction to loss. A variety of factors can affect your grieving experience, including: The nature of your loss. Your relationship to what or whom you lost. Your understanding of grief and how to manage it. Your support system. How to manage lifestyle changes Keep to your normal routine as much as possible. If you have trouble focusing or doing normal activities, it is acceptable to take some time away from your normal routine. Spend time with friends and loved ones. Eat a healthy diet, get plenty of sleep, and rest when you feel tired. How to recognize changes  The way that you deal with your grief will affect your ability to function as you normally do. When grieving, you may experience these changes: Numbness, shock, sadness, anxiety, anger, denial, and guilt. Thoughts about death. Unexpected crying. A physical sensation of emptiness in your stomach. Problems sleeping and eating. Tiredness (fatigue). Loss of interest in normal activities. Dreaming about or imagining seeing the person who died. A need to remember what or whom you lost. Difficulty thinking about anything other than your loss for a period of time. Relief. If you have been expecting the loss for a while, you may feel a sense of relief when it happens. Follow these instructions at home: Activity Express your feelings in healthy ways, such as: Talking with others about your loss. It may be helpful to find others who have had a similar loss, such  as a support group. Writing down your feelings in a journal. Doing physical activities to release stress and emotional energy. Doing creative activities like painting, sculpting, or playing or listening to music. Practicing resilience. This is the ability to recover and adjust after facing challenges. Reading some resources that encourage resilience may help you to learn ways to practice those behaviors.  General instructions Be patient with yourself and others. Allow the grieving process to happen, and remember that grieving takes time. It is likely that you may never feel completely done with some grief. You may find a way to move on while still cherishing memories and feelings about your loss. Accepting your loss is a process. It can take months or longer to adjust. Keep all follow-up visits as told by your health care provider. This is important. Where to find support To get support for managing loss: Ask your health care provider for help and recommendations, such as grief counseling or therapy. Think about joining a support group for people who are managing a loss. Where to find more information You can find more information about managing loss from: American Society of Clinical Oncology: www.cancer.net American Psychological Association: www.apa.org Contact a health care provider if: Your grief is extreme and keeps getting worse. You have ongoing grief that does not improve. Your body shows symptoms of grief, such as illness. You feel depressed, anxious, or lonely. Get help right away if: You have thoughts about hurting yourself or others. If you ever feel like you may hurt yourself or others, or have   thoughts about taking your own life, get help right away. You can go to your nearest emergency department or call: Your local emergency services (911 in the [place] A suicide crisis helpline, such as the National Suicide Prevention Lifeline at 1-800-273-8255. This is open 24 hours a  day. Summary Grief is the result of a major change or an absence of someone or something that you count on. Grief is a normal reaction to loss. The depth of grief and the period of recovery depend on the type of loss and your ability to adjust to the change and process your feelings. Processing grief requires patience and a willingness to accept your feelings and talk about your loss with people who are supportive. It is important to find resources that work for you and to realize that people experience grief differently. There is not one grieving process that works for everyone in the same way. Be aware that when grief becomes extreme, it can lead to more severe issues like isolation, depression, anxiety, or suicidal thoughts. Talk with your health care provider if you have any of these issues. This information is not intended to replace advice given to you by your health care provider. Make sure you discuss any questions you have with your health care provider. Document Revised: 09/29/2019 Document Reviewed: 09/29/2019 Elsevier Patient Education  2022 [employer].  

## 2021-02-05 NOTE — Assessment & Plan Note (Addendum)
Recent loss of wife over past 3 weeks, at this time is maintaining weight with Mirtazapine on board. Will continue Mirtazapine 7.5 MG nightly, educated him on this medication and use for mood and appetite.  Recommend he drink 1-2 Glucerna daily for protein.  Plan to have return in 4 weeks for follow-up, recommend he reach out to hospice for support group.  If ongoing loss consider imaging and labs.  CCM referral to nurse case manager today for spiritual support.

## 2021-02-06 ENCOUNTER — Telehealth: Payer: Self-pay

## 2021-02-06 NOTE — Chronic Care Management (AMB) (Signed)
  Chronic Care Management   Note  02/06/2021 Name: Philip Wood MRN: [id number] DOB: 04/12/1946  [name] is a 75 y.o. year old male who is a primary care patient of Cannady, [name], NP. I reached out to [place] by phone today in response to a referral sent by [name]'s PCP.  [name] was given information about Chronic Care Management services today including:  CCM service includes personalized support from designated clinical staff supervised by his physician, including individualized plan of care and coordination with other care providers 24/7 contact phone numbers for assistance for urgent and routine care needs. Service will only be billed when office clinical staff spend 20 minutes or more in a month to coordinate care. Only one practitioner may furnish and bill the service in a calendar month. The patient may stop CCM services at any time (effective at the end of the month) by phone call to the office staff. The patient is responsible for co-pay (up to 20% after annual deductible is met) if co-pay is required by the individual health plan.   Patient did not agree to enrollment in care management services and does not wish to consider at this time.  Follow up plan: Patient declines  engagement by the care management team. Appropriate care team members and provider have been notified via electronic communication.   Noreene Larsson, [place], [place], [place] [address] Direct Dial: [phone] [email or web address]

## 2021-02-11 ENCOUNTER — Ambulatory Visit
Admission: RE | Admit: 2021-02-11 | Discharge: 2021-02-11 | Disposition: A | Discharge status: Home / Self Care | Payer: Medicare Other | Source: Ambulatory Visit | Attending: Radiation Oncology | Admitting: Radiation Oncology

## 2021-02-11 ENCOUNTER — Other Ambulatory Visit: Payer: Self-pay

## 2021-02-11 DIAGNOSIS — G319 Degenerative disease of nervous system, unspecified: Secondary | ICD-10-CM | POA: Diagnosis not present

## 2021-02-11 DIAGNOSIS — I700 Atherosclerosis of aorta: Secondary | ICD-10-CM | POA: Diagnosis not present

## 2021-02-11 DIAGNOSIS — N261 Atrophy of kidney (terminal): Secondary | ICD-10-CM | POA: Insufficient documentation

## 2021-02-11 DIAGNOSIS — R599 Enlarged lymph nodes, unspecified: Secondary | ICD-10-CM | POA: Diagnosis not present

## 2021-02-11 DIAGNOSIS — J984 Other disorders of lung: Secondary | ICD-10-CM | POA: Diagnosis not present

## 2021-02-11 DIAGNOSIS — C3490 Malignant neoplasm of unspecified part of unspecified bronchus or lung: Secondary | ICD-10-CM | POA: Diagnosis not present

## 2021-02-11 DIAGNOSIS — I2510 Atherosclerotic heart disease of native coronary artery without angina pectoris: Secondary | ICD-10-CM | POA: Insufficient documentation

## 2021-02-11 DIAGNOSIS — N433 Hydrocele, unspecified: Secondary | ICD-10-CM | POA: Diagnosis not present

## 2021-02-11 DIAGNOSIS — K4090 Unilateral inguinal hernia, without obstruction or gangrene, not specified as recurrent: Secondary | ICD-10-CM | POA: Insufficient documentation

## 2021-02-11 LAB — GLUCOSE, CAPILLARY: Glucose-Capillary: 111 mg/dL — ABNORMAL HIGH (ref 70–99)

## 2021-02-11 IMAGING — CT NM PET TUM IMG RESTAG (PS) SKULL BASE T - THIGH
1 of 9 series · 1 of 25 positions shown · non-contrast
Comparison: CT chest [DATE] and [DATE].  PET-CT [DATE].

CLINICAL DATA: Subsequent treatment strategy for non-small cell
lung cancer. Previous radiation therapy. Findings suspicious for
local recurrence on recent chest CT

EXAM:
NUCLEAR MEDICINE PET SKULL BASE TO THIGH
TECHNIQUE: 9.4 mCi F-18 FDG was injected intravenously. Full-ring PET imaging
was performed from the skull base to thigh after the radiotracer. CT
data was obtained and used for attenuation correction and anatomic
localization.
Fasting blood glucose: 111 mg/dl

[Series 3: ct wb 5.0 b30f · axial · 5.0mm · 0.98mm/px · 1 of 329 slices shown]
[im 329/329  brain]
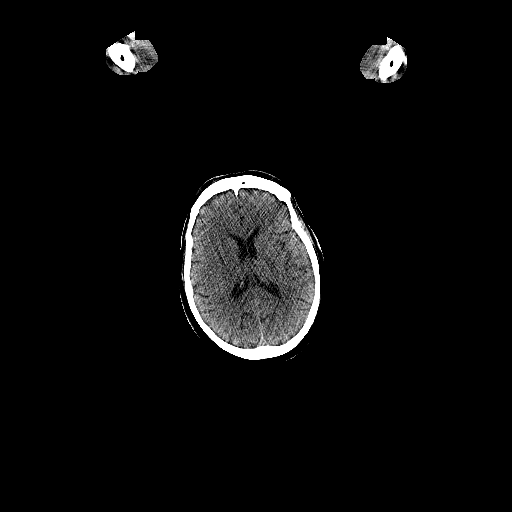

[1 of 25 positions shown; findings below may reference images not displayed]

FINDINGS: Mediastinal blood pool activity: SUV max

NECK:

No hypermetabolic cervical lymph nodes are identified.There are no
lesions of the pharyngeal mucosal space.

Incidental CT findings: Left carotid stent noted.

CHEST:

The recurrent right suprahilar mass demonstrates peripheral
hypermetabolic activity which is greatest medially (SUV max 7.6),
consistent with local recurrence. This measures approximately 3.4 x
3.2 cm on image 94/3. There is contiguous right paratracheal
hypermetabolic adenopathy. The mildly prominent subcarinal node seen
on the recent CT demonstrates no hypermetabolic activity. The
superior extent of the pulmonary component demonstrates no
significant metabolic activity. There is no peripheral
hypermetabolic pulmonary activity. Specifically, no hypermetabolic
activity is demonstrated associated with the increased nodular
septal thickening in the left lower lobe.

Incidental CT findings: Atherosclerosis of the aorta, great vessels
and coronary arteries. No significant pleural or pericardial
effusion. Radiation changes in the central right upper lobe with
left lower lobe scarring. No suspicious peripheral pulmonary
nodules.

ABDOMEN/PELVIS:

There is no hypermetabolic activity within the liver, adrenal
glands, spleen or pancreas. There is no hypermetabolic nodal
activity. There are apparent postsurgical changes in the right
inguinal region with associated low level hypermetabolic activity
(SUV max 2.7).

Incidental CT findings: Severe right renal atrophy with cortical
thinning. Diffuse aortic and branch vessel atherosclerosis, prior
cholecystectomy, probable left periaortic lymphangioma (2.0 cm on
image 173/3), periampullary duodenal diverticulum and moderate
enlargement of the prostate gland are again noted. There is a right
inguinal hernia containing fat and an associated small right scrotal
hydrocele.

SKELETON:

There is no hypermetabolic activity to suggest osseous metastatic
disease.

Incidental CT findings: none
IMPRESSION: 1. Hypermetabolic central right upper lobe/perihilar recurrence with
adjacent contiguous right paratracheal adenopathy.
2. No distant or progressive metastases identified. No suspicious
peripheral lung hypermetabolic activity.
3. Low level hypermetabolic activity in the right groin associated
with apparent postsurgical changes, presumably from recent carotid
stent placement.
4. Coronary and Aortic Atherosclerosis ([AH]-[AH]).

## 2021-02-11 MED ORDER — FLUDEOXYGLUCOSE F - 18 (FDG) INJECTION
8.9000 | Freq: Once | INTRAVENOUS | Status: AC | PRN
  Administered 2021-02-11: 9.4 via INTRAVENOUS

## 2021-02-13 ENCOUNTER — Inpatient Hospital Stay: Payer: Medicare Other | Attending: Oncology | Admitting: Oncology

## 2021-02-13 ENCOUNTER — Other Ambulatory Visit: Payer: Self-pay | Admitting: *Deleted

## 2021-02-13 ENCOUNTER — Other Ambulatory Visit: Payer: Self-pay

## 2021-02-13 ENCOUNTER — Ambulatory Visit
Admission: RE | Admit: 2021-02-13 | Discharge: 2021-02-13 | Disposition: A | Discharge status: Home / Self Care | Payer: Medicare Other | Source: Ambulatory Visit | Attending: Radiation Oncology | Admitting: Radiation Oncology

## 2021-02-13 ENCOUNTER — Encounter: Payer: Self-pay | Admitting: Oncology

## 2021-02-13 VITALS — BP 126/76 | HR 68 | Temp 98.2°F | Resp 16 | Wt 172.0 lb

## 2021-02-13 VITALS — BP 126/76 | HR 68 | Resp 16 | Wt 172.0 lb

## 2021-02-13 DIAGNOSIS — R918 Other nonspecific abnormal finding of lung field: Secondary | ICD-10-CM | POA: Insufficient documentation

## 2021-02-13 DIAGNOSIS — C3490 Malignant neoplasm of unspecified part of unspecified bronchus or lung: Secondary | ICD-10-CM

## 2021-02-13 DIAGNOSIS — C7801 Secondary malignant neoplasm of right lung: Secondary | ICD-10-CM | POA: Diagnosis not present

## 2021-02-13 DIAGNOSIS — N289 Disorder of kidney and ureter, unspecified: Secondary | ICD-10-CM

## 2021-02-13 DIAGNOSIS — Z7189 Other specified counseling: Secondary | ICD-10-CM | POA: Diagnosis not present

## 2021-02-13 DIAGNOSIS — C329 Malignant neoplasm of larynx, unspecified: Secondary | ICD-10-CM | POA: Diagnosis not present

## 2021-02-13 DIAGNOSIS — C3412 Malignant neoplasm of upper lobe, left bronchus or lung: Secondary | ICD-10-CM

## 2021-02-13 DIAGNOSIS — R911 Solitary pulmonary nodule: Secondary | ICD-10-CM | POA: Diagnosis not present

## 2021-02-13 DIAGNOSIS — C3432 Malignant neoplasm of lower lobe, left bronchus or lung: Secondary | ICD-10-CM | POA: Diagnosis not present

## 2021-02-13 DIAGNOSIS — Z08 Encounter for follow-up examination after completed treatment for malignant neoplasm: Secondary | ICD-10-CM | POA: Diagnosis not present

## 2021-02-13 DIAGNOSIS — Z8521 Personal history of malignant neoplasm of larynx: Secondary | ICD-10-CM | POA: Diagnosis not present

## 2021-02-13 NOTE — Progress Notes (Signed)
Pt here for follow up. No new concerns voiced.   

## 2021-02-13 NOTE — Progress Notes (Signed)
Radiation Oncology Follow up Note  Name: Philip Wood   Date:   02/13/2021 MRN:  [id number] DOB: 05/11/1945    This 75 y.o. male presents to the clinic today for PET CT results for new mass in the right hilar region in patient previous treated SBRT to his right upper lobe for stage I non-small cell lung cancer 1 year prior.  REFERRING PROVIDER: Venita Lick, NP  HPI: Patient is a 75 year old male now out over 1 year having completed SBRT to his right upper lobe for stage I non-small cell lung cancer..  Clinically has been doing well he has some slight productive cough although on follow-up CT scans showed possible progression of disease in the right hilar region..  Tumor seem to invade the mediastinum.  We ordered a PET CT scan showing hypermetabolic tibia and central right upper lobe perihilar recurrence adjacent to contiguous right paratracheal adenopathy no distant or progressive metastatic diastasis identified.  No other suspicious peripheral lung lesions noted.  He had recent carotid stent placed.  Again he is fairly asymptomatic has a slight nonproductive cough no hemoptysis or chest tightness.  COMPLICATIONS OF TREATMENT: none  FOLLOW UP COMPLIANCE: keeps appointments   PHYSICAL EXAM:  BP 126/76   Pulse 68   Temp 98.2 F (36.8 C) (Tympanic)   Resp 16   Wt 172 lb (78 kg)   BMI 25.40 kg/m  Well-developed well-nourished patient in NAD. HEENT reveals PERLA, EOMI, discs not visualized.  Oral cavity is clear. No oral mucosal lesions are identified. Neck is clear without evidence of cervical or supraclavicular adenopathy. Lungs are clear to A&P. Cardiac examination is essentially unremarkable with regular rate and rhythm without murmur rub or thrill. Abdomen is benign with no organomegaly or masses noted. Motor sensory and DTR levels are equal and symmetric in the upper and lower extremities. Cranial nerves II through XII are grossly intact. Proprioception is intact. No peripheral  adenopathy or edema is identified. No motor or sensory levels are noted. Crude visual fields are within normal range.  RADIOLOGY RESULTS: PET CT scan reviewed compatible with above-stated findings  PLAN: At this time I am referring patient to Dr. Patsey Berthold for bronchoscopy for pathologic diagnosis.  Patient is also seeing Dr. Tasia Catchings today.  Believe this is probably a stage IIIb non-small cell lung cancer would benefit from concurrent chemoradiation.  I have briefly explained my recommendations and treatment plan to the patient.  I will follow-up with him in several weeks after he has bronchoscopy and hopefully tissue diagnosis.  Patient comprehends my recommendations well.  I would like to take this opportunity to thank you for allowing me to participate in the care of your patient.[name], MD

## 2021-02-13 NOTE — Progress Notes (Addendum)
Hematology/Oncology Follow up note [facility] Telephone:([phone] Fax:([phone]) [phone]   Patient Care Team: Venita Lick, NP as PCP - General (Nurse Practitioner) Wellington Hampshire, MD as PCP - Cardiology (Cardiology) Beverly Gust, MD (Otolaryngology) Telford Nab, RN as Registered Nurse [name], MD as Radiation Oncologist (Radiation Oncology)  REFERRING PROVIDER: Venita Lick, NP REASON FOR VISIT Follow up for lung nodules and presumed lung cancer s/p SBRT  HISTORY OF PRESENTING ILLNESS:  he has history of 31 pack a day smoking history,  #Patient had a history of 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  He got definitive radiation to the larynx, and the patient has been doing annual checkup with ENT Dr. Tami Ribas.  # 09/02/2017 ENB biopsy of both lung nodule. Pathology of biopsies showed no lesional cells identified. However he is a high risk patient and negative biopsy results do not exclude malignancy.  May 2019 Patient underwent SBRT to the left lung lesion.  # 02/25/2019 CT scan showed right upper lobe nodule interval increase in size.  #03/2019 he finished SBRT to right upper lobe lesion.    INTERVAL HISTORY Philip Wood is a 75 y.o. male who has above history reviewed by me today presents for follow-up visit for management of lung nodule for presumed lung cancer 01/24/2021, CT with contrast showed progressive right upper lobe malignancy, while separated from the site of regional right upper lobe lesion.  2 masslike areas, including the more central of these which directly invades the mediastinum, with potential direct tracheal invasion.  Progressive right hilar and subcarinal lymphadenopathy.  Left lower lobe at the site of previously noted left lower  lobe lesion, there has been progressive septal thickening and nodularity which has slowly increased over numerous prior examinations.  Highly concerning for locally recurrent disease.  Aortic atherosclerosis, in addition to three-vessel coronary artery disease.  Calcification of aortic valve.  1.7 cm lesion in the posterior aspect of the interpolar region of the right kidney.  Suspicious for small neoplasm.  02/11/2021, PET scan showed hypermetabolic central right upper lobe/perihilar recurrence with adjacent contiguous right paratracheal adenopathy.  No distal or progressive metastasis identified.  Low-level hypermetabolic activity in the right groin associated with it.  Postsurgical changes.  Coronary and aortic atherosclerosis.  Patient presents to establish care to discuss management plan.  Patient has chronic shortness of breath which has not changed.  Denies hemoptysis, unintentional weight loss.    Review of Systems  Constitutional:  Negative for appetite change, chills, fatigue, fever and unexpected weight change.  HENT:   Negative for hearing loss and voice change.   Eyes:  Negative for eye problems and icterus.  Respiratory:  Positive for shortness of breath. Negative for chest tightness and cough.   Cardiovascular:  Negative for chest pain and leg swelling.  Gastrointestinal:  Negative for abdominal distention and abdominal pain.  Endocrine: Negative for hot flashes.  Genitourinary:  Negative for difficulty urinating, dysuria and frequency.   Musculoskeletal:  Negative for arthralgias.  Skin:  Negative for itching and rash.  Neurological:  Negative for light-headedness and numbness.  Hematological:  Negative for adenopathy. Does not bruise/bleed easily.  Psychiatric/Behavioral:  Negative for confusion.      MEDICAL HISTORY:  Past Medical History:  Diagnosis Date   Bruit of left carotid artery  Cancer ([place])    laryngeal   Chronic heart failure (HCC)    Chronic kidney  disease    COPD (chronic obstructive pulmonary disease) (HCC)    Coronary artery disease    Diabetes mellitus without complication (HCC)    GERD (gastroesophageal reflux disease)    Headache    History of kidney stones    Hyperlipidemia    Hypertension    Lumbago    Neuropathy    Tobacco abuse disorder     SURGICAL HISTORY: Past Surgical History:  Procedure Laterality Date   ARTERY BIOPSY Right 11/14/2020   Procedure: BIOPSY TEMPORAL ARTERY;  Surgeon: Katha Cabal, MD;  Location: [facility];  Service: Vascular;  Laterality: Right;   CAROTID PTA/STENT INTERVENTION Left 01/30/2021   Procedure: CAROTID PTA/STENT INTERVENTION;  Surgeon: Katha Cabal, MD;  Location: [place];  Service: Cardiovascular;  Laterality: Left;   CHOLECYSTECTOMY     ELECTROMAGNETIC NAVIGATION BROCHOSCOPY N/A 08/19/2017   Procedure: ELECTROMAGNETIC NAVIGATION BRONCHOSCOPY;  Surgeon: Flora Lipps, MD;  Location: [facility];  Service: Cardiopulmonary;  Laterality: N/A;   ELECTROMAGNETIC NAVIGATION BROCHOSCOPY Left 09/02/2017   Procedure: ELECTROMAGNETIC NAVIGATION BRONCHOSCOPY;  Surgeon: Flora Lipps, MD;  Location: [facility];  Service: Cardiopulmonary;  Laterality: Left;   THROAT SURGERY      SOCIAL HISTORY: Social History   Socioeconomic History   Marital status: Widowed    Spouse name: Not on file   Number of children: Not on file   Years of education: Not on file   Highest education level: 10th grade  Occupational History   Not on file  Tobacco Use   Smoking status: Light Smoker    Packs/day: 0.60    Years: 53.00    Pack years: 31.80    Types: Cigarettes   Smokeless tobacco: Never   Tobacco comments:    Aprrox 10 cigs/day  Vaping Use   Vaping Use: Never used  Substance and Sexual Activity   Alcohol use: No    Alcohol/week: 0.0 standard drinks   Drug use: No   Sexual activity: Not Currently  Other Topics Concern   Not on file  Social History Narrative   Works part time at  [employer]   Social Determinants of Radio broadcast assistant Strain: Low Risk    Difficulty of Paying Living Expenses: Not hard at all  Food Insecurity: No Food Insecurity   Worried About Charity fundraiser in the Last Year: Never true   Arboriculturist in the Last Year: Never true  Transportation Needs: No Transportation Needs   Lack of Transportation (Medical): No   Lack of Transportation (Non-Medical): No  Physical Activity: Inactive   Days of Exercise per Week: 0 days   Minutes of Exercise per Session: 0 min  Stress: No Stress Concern Present   Feeling of Stress : Not at all  Social Connections: Not on file  Intimate Partner Violence: Not on file    FAMILY HISTORY: Family History  Problem Relation Age of Onset   Heart disease Father    Heart attack Father    Brain cancer Sister    Cervical cancer Sister    Stomach cancer Sister    Stomach cancer Sister    Diabetes Sister    Lupus Sister    Rectal cancer Brother    Lung cancer Brother    Lung cancer Brother    Lung cancer Brother    Lung cancer Brother     ALLERGIES:  has No Known Allergies.  MEDICATIONS:  Current Outpatient Medications  Medication Sig Dispense Refill   acetaminophen (TYLENOL) 500 MG tablet Take 500-1,000 mg by mouth every 6 (six) hours as needed (for pain).     allopurinol (ZYLOPRIM) 100 MG tablet Take 1 tablet (100 mg total) by mouth daily. (Patient taking differently: Take 100 mg by mouth in the morning.) 90 tablet 4   aspirin EC 81 MG tablet Take 81 mg by mouth in the morning. Swallow whole.     atorvastatin (LIPITOR) 80 MG tablet Take 1 tablet (80 mg total) by mouth daily at 6 PM. 90 tablet 4   clopidogrel (PLAVIX) 75 MG tablet Take 1 tablet (75 mg total) by mouth daily. 30 tablet 11   metFORMIN (GLUCOPHAGE) 500 MG tablet Take 1 tablet (500 mg total) by mouth 2 (two) times daily with a meal. 180 tablet 4   metoprolol succinate (TOPROL XL) 25 MG 24 hr tablet Take 1 tablet (25 mg total) by mouth  daily. (Patient taking differently: Take 25 mg by mouth in the morning.) 90 tablet 4   mirtazapine (REMERON) 7.5 MG tablet Take 1 tablet (7.5 mg total) by mouth at bedtime. 90 tablet 4   ramipril (ALTACE) 10 MG capsule Take 1 capsule (10 mg total) by mouth daily. (Patient taking differently: Take 10 mg by mouth in the morning.) 90 capsule 4   tiotropium (SPIRIVA HANDIHALER) 18 MCG inhalation capsule PLACE 1 CAPSULE INTO INHALER AND INHALE ITS CONTENTS ONCE DAILY. (Patient taking differently: Place 18 mcg into inhaler and inhale in the morning.) 90 capsule 4   vitamin B-12 (CYANOCOBALAMIN) 1000 MCG tablet Take 1,000 mcg by mouth daily.     No current facility-administered medications for this visit.     PHYSICAL EXAMINATION: ECOG PERFORMANCE STATUS: 1 - Symptomatic but completely ambulatory Vitals:   02/13/21 1334  BP: 126/76  Pulse: 68  Resp: 16  SpO2: 97%   Filed Weights   02/13/21 1334  Weight: 172 lb (78 kg)  Physical Exam Constitutional:      General: He is not in acute distress.    Appearance: He is not diaphoretic.  HENT:     Head: Normocephalic and atraumatic.     Nose: Nose normal.     Mouth/Throat:     Pharynx: No oropharyngeal exudate.  Eyes:     General: No scleral icterus.    Pupils: Pupils are equal, round, and reactive to light.  Cardiovascular:     Rate and Rhythm: Normal rate and regular rhythm.     Heart sounds: No murmur heard. Pulmonary:     Effort: Pulmonary effort is normal. No respiratory distress.     Breath sounds: No rales.     Comments: Decreased breath sound bilaterally Chest:     Chest wall: No tenderness.  Abdominal:     General: There is no distension.     Palpations: Abdomen is soft.     Tenderness: There is no abdominal tenderness.  Musculoskeletal:        General: Normal range of motion.     Cervical back: Normal range of motion and neck supple.  Skin:    General: Skin is warm and dry.     Findings: No erythema.  Neurological:      Mental Status: He is alert and oriented to person, place, and time.     Cranial Nerves: No cranial nerve deficit.     Motor: No abnormal muscle tone.     Coordination: Coordination  normal.  Psychiatric:        Mood and Affect: Affect normal.          LABORATORY DATA:  I have reviewed the data as listed Lab Results  Component Value Date   WBC 6.4 01/31/2021   HGB 10.9 (L) 01/31/2021   HCT 32.5 (L) 01/31/2021   MCV 94.8 01/31/2021   PLT 159 01/31/2021   Recent Labs    05/31/20 0904 07/30/20 0759 10/15/20 1535 10/15/20 1535 11/13/20 1152 11/14/20 0753 11/28/20 0904 01/24/21 1010 01/30/21 0740 01/31/21 0738  NA 141  --  139   < > 138 141 142  --   --  137  K 4.8  --  4.4  --  4.1 4.0 5.0  --   --  4.4  CL 101  --  99  --  105 102 103  --   --  104  CO2 26  --  24  --  26  --  25  --   --  28  GLUCOSE 96  --  101*   < > 110* 115* 103*  --   --  109*  BUN 16  --  15   < > 20 18 13   --  19 17  CREATININE 1.15   < > 1.01  --  0.98 1.10 0.95 1.10 0.93 0.85  CALCIUM 9.0  --  8.9  --  9.1  --  9.5  --   --  8.5*  GFRNONAA 62  --   --   --  >60  --   --   --  >60 >60  GFRAA 72  --   --   --   --   --   --   --   --   --   PROT  --   --  5.9*  --   --   --  6.1  --   --   --   ALBUMIN  --   --  3.9  --   --   --  4.0  --   --   --   AST  --   --  11  --   --   --  6  --   --   --   ALT  --   --  11  --   --   --  10  --   --   --   ALKPHOS  --   --  108  --   --   --  126*  --   --   --   BILITOT  --   --  0.2  --   --   --  0.3  --   --   --    < > = values in this interval not displayed.    RADIOGRAPHIC STUDIES: I have personally reviewed the radiological images as listed and agreed with the findings in the report. CT Chest W Contrast  Addendum Date: 01/26/2021   ADDENDUM REPORT: 01/26/2021 12:24 ADDENDUM: As mentioned in the body of the report there is a 1.7 cm lesion in the posterior aspect of the interpolar region of the right kidney, suspicious for small neoplasm,  either a renal cell carcinoma or metastatic lesion of the kidney. This could be further evaluated with nonemergent abdominal MRI with and without IV gadolinium if clinically appropriate. Electronically Signed   By: Vinnie Langton M.D.   On: 01/26/2021 12:24  Result Date: 01/26/2021 CLINICAL DATA:  75 year old male with history of non-small cell lung cancer treated with radiation therapy presenting with 1 week of hemoptysis and cough. Follow-up study. EXAM: CT CHEST WITH CONTRAST TECHNIQUE: Multidetector CT imaging of the chest was performed during intravenous contrast administration. CONTRAST:  62mL OMNIPAQUE IOHEXOL 350 MG/ML SOLN COMPARISON:  CT of the chest 07/30/2020. FINDINGS: Cardiovascular: Heart size is normal. There is no significant pericardial fluid, thickening or pericardial calcification. There is aortic atherosclerosis, as well as atherosclerosis of the great vessels of the mediastinum and the coronary arteries, including calcified atherosclerotic plaque in the left anterior descending, left circumflex and right coronary arteries. Thickening calcification of the aortic valve. Mild calcification of the mitral annulus. Mediastinum/Nodes: Enlarging subcarinal (axial image 72 of series 2 measuring 1.7 cm in short axis) and right hilar (axial image 71 of series 2) measuring 1.7 cm) lymph nodes, highly concerning for metastatic disease. No left hilar lymphadenopathy. Esophagus is unremarkable in appearance. No axillary lymphadenopathy. Lungs/Pleura: Definitive enlargement of a mass-like area with convex margins in the right upper lobe (axial image 50 of series 2 and coronal image 85 of series 4) which currently measures 2.0 x 4.3 x 2.5 cm, compatible with progressive right upper lobe neoplasm. In association with this there is a more central mass in the right upper lobe (axial image 59 of series 2 and coronal image 79 of series 4) measuring 5.0 x 4.0 x 2.3 cm which directly invades the mediastinum and  is now completely obscuring the fat plane between the lesion and the adjacent distal trachea and proximal right mainstem bronchus. There is a contour abnormality in the region of the distal trachea best appreciated on coronal images 80-82 of series 4, such that early direct invasion into the trachea is not excluded. Slight progression of area of septal thickening and nodularity in the left lower lobe, best appreciated on axial image 115 of series 3 where the bulkiest portion of this region measures approximately 2.1 x 2.1 cm. No acute consolidative airspace disease. No pleural effusions. Diffuse bronchial wall thickening with mild centrilobular and paraseptal emphysema. Upper Abdomen: Status post cholecystectomy. Mild intra and extrahepatic biliary ductal dilatation, similar to prior studies, presumably reflective of benign post cholecystectomy physiology. Aortic atherosclerosis. Severe atrophy of the right kidney with multiple small lesions, most concerning of which is a 1.7 cm intermediate attenuation (43 HU) lesion extending off the posterior aspect of the interpolar region (axial image 179 of series 2), new compared to prior PET-CT. Musculoskeletal: There are no aggressive appearing lytic or blastic lesions noted in the visualized portions of the skeleton. IMPRESSION: 1. Progressive right upper lobe malignancy. Notably, this appears well separated from the site of the regional right upper lobe lesion which was in the anterior aspect of the right upper lobe. On today's study, there are 2 mass-like areas, including the more central of these which directly invades the mediastinum, with potential direct tracheal invasion, as well as progressive right hilar and subcarinal lymphadenopathy. 2. In addition, in the left lower lobe at the site of the previously noted left lower lobe lesion there has been progressive septal thickening and nodularity which has slowly increased over numerous prior examinations. Findings are  highly concerning for locally recurrent disease. 3. Aortic atherosclerosis, in addition to 3 vessel coronary artery disease. Assessment for potential risk factor modification, dietary therapy or pharmacologic therapy may be warranted, if clinically indicated. 4. There are calcifications of the aortic valve. Echocardiographic correlation for evaluation of potential  valvular dysfunction may be warranted if clinically indicated. These results will be called to the ordering clinician or representative by the Radiologist Assistant, and communication documented in the PACS or [employer]. Aortic Atherosclerosis (ICD10-I70.0) and Emphysema (ICD10-J43.9). Electronically Signed: By: Vinnie Langton M.D. On: 01/26/2021 10:07   PERIPHERAL VASCULAR CATHETERIZATION  Result Date: 01/30/2021 See surgical note for result.  NM PET Image Restag (PS) Skull Base To Thigh  Result Date: 02/13/2021 CLINICAL DATA:  Subsequent treatment strategy for non-small cell lung cancer. Previous radiation therapy. Findings suspicious for local recurrence on recent chest CT EXAM: NUCLEAR MEDICINE PET SKULL BASE TO THIGH TECHNIQUE: 9.4 mCi F-18 FDG was injected intravenously. Full-ring PET imaging was performed from the skull base to thigh after the radiotracer. CT data was obtained and used for attenuation correction and anatomic localization. Fasting blood glucose: 111 mg/dl COMPARISON:  CT chest 01/24/2021 and 07/30/2020.  PET-CT 03/10/2019. FINDINGS: Mediastinal blood pool activity: SUV max 1.5 NECK: No hypermetabolic cervical lymph nodes are identified.There are no lesions of the pharyngeal mucosal space. Incidental CT findings: Left carotid stent noted. CHEST: The recurrent right suprahilar mass demonstrates peripheral hypermetabolic activity which is greatest medially (SUV max 7.6), consistent with local recurrence. This measures approximately 3.4 x 3.2 cm on image 94/3. There is contiguous right paratracheal hypermetabolic  adenopathy. The mildly prominent subcarinal node seen on the recent CT demonstrates no hypermetabolic activity. The superior extent of the pulmonary component demonstrates no significant metabolic activity. There is no peripheral hypermetabolic pulmonary activity. Specifically, no hypermetabolic activity is demonstrated associated with the increased nodular septal thickening in the left lower lobe. Incidental CT findings: Atherosclerosis of the aorta, great vessels and coronary arteries. No significant pleural or pericardial effusion. Radiation changes in the central right upper lobe with left lower lobe scarring. No suspicious peripheral pulmonary nodules. ABDOMEN/PELVIS: There is no hypermetabolic activity within the liver, adrenal glands, spleen or pancreas. There is no hypermetabolic nodal activity. There are apparent postsurgical changes in the right inguinal region with associated low level hypermetabolic activity (SUV max 2.7). Incidental CT findings: Severe right renal atrophy with cortical thinning. Diffuse aortic and branch vessel atherosclerosis, prior cholecystectomy, probable left periaortic lymphangioma (2.0 cm on image 173/3), periampullary duodenal diverticulum and moderate enlargement of the prostate gland are again noted. There is a right inguinal hernia containing fat and an associated small right scrotal hydrocele. SKELETON: There is no hypermetabolic activity to suggest osseous metastatic disease. Incidental CT findings: none IMPRESSION: 1. Hypermetabolic central right upper lobe/perihilar recurrence with adjacent contiguous right paratracheal adenopathy. 2. No distant or progressive metastases identified. No suspicious peripheral lung hypermetabolic activity. 3. Low level hypermetabolic activity in the right groin associated with apparent postsurgical changes, presumably from recent carotid stent placement. 4. Coronary and Aortic Atherosclerosis (ICD10-I70.0). Electronically Signed   By:  Richardean Sale M.D.   On: 02/13/2021 09:39      ASSESSMENT & PLAN:  1. Solitary pulmonary nodule   2. Squamous cell carcinoma of larynx (HCC)   3. Goals of care, counseling/discussion   4. Kidney lesion    #Presumed lung cancer, status post SBRT of left lung nodule in 2019 and SBRP of right upper lobe nodule . CT and PET Images were independently reviewed by me and discussed with patient. Suspecting recurrence of her right upper lobe lung cancer with nodal involvement. Recommend tissue diagnosis. Refer to pulmonology for bronchoscopy biopsy. Recommend brain MRI with and without contrast-I will obtain after he establish tissue diagnosis.  He had a negative  MRI brain in June 2022. We discussed possible treatment with concurrent chemotherapy and radiation.  Detailed treatment plan is dependent on tissue diagnosis/medical testing/staging images.  # Squamous cell carcinoma of larynx, recommend he continue to have annual check up with ENT Dr.McQueen.  #Right kidney lesion, not well characterized on PET scan.  Once he finishes work-up for lung mass, will obtain MRI renal protocol for further evaluation of the kidney lesion.  We spent sufficient time to discuss many aspect of care, questions were answered to patient's satisfaction.  Earlie Server, MD, PhD Hematology Oncology [place] at [facility]  02/13/2021

## 2021-02-19 ENCOUNTER — Ambulatory Visit: Payer: Medicare Other | Admitting: Oncology

## 2021-02-21 ENCOUNTER — Other Ambulatory Visit: Payer: Self-pay

## 2021-02-21 ENCOUNTER — Telehealth: Payer: Self-pay | Admitting: Pulmonary Disease

## 2021-02-21 ENCOUNTER — Encounter: Payer: Self-pay | Admitting: Pulmonary Disease

## 2021-02-21 ENCOUNTER — Ambulatory Visit: Payer: Medicare Other | Admitting: Pulmonary Disease

## 2021-02-21 VITALS — BP 122/78 | HR 80 | Temp 97.7°F | Ht 69.0 in | Wt 170.8 lb

## 2021-02-21 DIAGNOSIS — C3401 Malignant neoplasm of right main bronchus: Secondary | ICD-10-CM | POA: Diagnosis not present

## 2021-02-21 DIAGNOSIS — R590 Localized enlarged lymph nodes: Secondary | ICD-10-CM

## 2021-02-21 DIAGNOSIS — J449 Chronic obstructive pulmonary disease, unspecified: Secondary | ICD-10-CM | POA: Diagnosis not present

## 2021-02-21 DIAGNOSIS — F17210 Nicotine dependence, cigarettes, uncomplicated: Secondary | ICD-10-CM

## 2021-02-21 DIAGNOSIS — I5022 Chronic systolic (congestive) heart failure: Secondary | ICD-10-CM

## 2021-02-21 NOTE — Telephone Encounter (Signed)
Prior Auth Not Required for codes [phone], [phone] Refer # [id number]

## 2021-02-21 NOTE — H&P (View-Only) (Signed)
Subjective:    Patient ID: Philip Wood, male    DOB: [birth year], 75 y.o.   MRN: [id number] Chief Complaint  Patient presents with   Follow-up    C/o dry cough at times prod with white sputum and occ wheezing.    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  Patient had negative navigational bronchoscopy at that time.  Has been monitored closely by medical and radiation oncology.  He is kindly referred by Dr. [name] and Dr. Earlie Server for evaluation for bronchoscopy with endobronchial ultrasound due to right hilar mass and nodal metastasis.  The patient had had a new mass noted on the right hilar region and underwent SBRT 1 year prior.  On monitoring CT it was found that this area had grown indicating progression of disease.  It appears the tumor also invaded the mediastinum.  CT showed hypermetabolic central right upper lobe perihilar recurrence.  He has not had hemoptysis nor chest pain.  He has had nonproductive cough.  No fevers, chills or sweats.  He has noted weight loss but states that this is happened since his wife passed away a month ago he states he is not eating well due to this.  Recently had carotid stent placement (12 October) without incident or sequela.  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  He has moderate COPD and is on Spiriva which she feels gives him good control.   Review of Systems A 10 point review of systems was performed and it is as noted above otherwise negative.  Past Medical History:  Diagnosis Date   Bruit of left carotid artery    Cancer ([place])    laryngeal   Chronic heart failure (HCC)    Chronic kidney disease    COPD (chronic obstructive pulmonary disease) (HCC)    Coronary artery disease    Diabetes mellitus without complication (HCC)    GERD (gastroesophageal reflux  disease)    Headache    History of kidney stones    Hyperlipidemia    Hypertension    Lumbago    Neuropathy    Tobacco abuse disorder    Past Surgical History:  Procedure Laterality Date   ARTERY BIOPSY Right 11/14/2020   Procedure: BIOPSY TEMPORAL ARTERY;  Surgeon: Katha Cabal, MD;  Location: [facility];  Service: Vascular;  Laterality: Right;   CAROTID PTA/STENT INTERVENTION Left 01/30/2021   Procedure: CAROTID PTA/STENT INTERVENTION;  Surgeon: Katha Cabal, MD;  Location: [facility];  Service: Cardiovascular;  Laterality: Left;   CHOLECYSTECTOMY     ELECTROMAGNETIC NAVIGATION BROCHOSCOPY N/A 08/19/2017   Procedure: ELECTROMAGNETIC NAVIGATION BRONCHOSCOPY;  Surgeon: Flora Lipps, MD;  Location: [facility];  Service: Cardiopulmonary;  Laterality: N/A;   ELECTROMAGNETIC NAVIGATION BROCHOSCOPY Left 09/02/2017   Procedure: ELECTROMAGNETIC NAVIGATION BRONCHOSCOPY;  Surgeon: Flora Lipps, MD;  Location: [facility];  Service: Cardiopulmonary;  Laterality: Left;   THROAT SURGERY     Family History  Problem Relation Age of Onset   Heart disease Father    Heart attack Father    Brain cancer Sister    Cervical cancer Sister    Stomach cancer Sister    Stomach cancer Sister    Diabetes Sister    Lupus Sister    Rectal cancer Brother    Lung cancer Brother  Lung cancer Brother    Lung cancer Brother    Lung cancer Brother    Social History   Tobacco Use   Smoking status: Light Smoker    Packs/day: 1.00    Years: 53.00    Pack years: 53.00    Types: Cigarettes   Smokeless tobacco: Never   Tobacco comments:    Aprrox 10 cigs/day--02/21/2021  Substance Use Topics   Alcohol use: No    Alcohol/week: 0.0 standard drinks   No Known Allergies Current Meds  Medication Sig   acetaminophen (TYLENOL) 500 MG tablet Take 500-1,000 mg by mouth every 6 (six) hours as needed (for pain).   allopurinol (ZYLOPRIM) 100 MG tablet Take 1 tablet (100 mg total) by mouth daily.  (Patient taking differently: Take 100 mg by mouth in the morning.)   aspirin EC 81 MG tablet Take 81 mg by mouth in the morning. Swallow whole.   atorvastatin (LIPITOR) 80 MG tablet Take 1 tablet (80 mg total) by mouth daily at 6 PM.   clopidogrel (PLAVIX) 75 MG tablet Take 1 tablet (75 mg total) by mouth daily.   metFORMIN (GLUCOPHAGE) 500 MG tablet Take 1 tablet (500 mg total) by mouth 2 (two) times daily with a meal.   metoprolol succinate (TOPROL XL) 25 MG 24 hr tablet Take 1 tablet (25 mg total) by mouth daily. (Patient taking differently: Take 25 mg by mouth in the morning.)   mirtazapine (REMERON) 7.5 MG tablet Take 1 tablet (7.5 mg total) by mouth at bedtime.   ramipril (ALTACE) 10 MG capsule Take 1 capsule (10 mg total) by mouth daily. (Patient taking differently: Take 10 mg by mouth in the morning.)   tiotropium (SPIRIVA HANDIHALER) 18 MCG inhalation capsule PLACE 1 CAPSULE INTO INHALER AND INHALE ITS CONTENTS ONCE DAILY. (Patient taking differently: Place 18 mcg into inhaler and inhale in the morning.)   vitamin B-12 (CYANOCOBALAMIN) 1000 MCG tablet Take 1,000 mcg by mouth daily.   Immunization History  Administered Date(s) Administered   Fluad Quad(high Dose 65+) 01/08/2021   Influenza, High Dose Seasonal PF 01/11/2016, 01/12/2017, 02/12/2018, 01/25/2019   Influenza,inj,Quad PF,6+ Mos 03/28/2015   Influenza-Unspecified 01/17/2020   Moderna Sars-Covid-2 Vaccination 06/18/2019, 07/16/2019, 02/15/2020, 08/10/2020   Pneumococcal Conjugate-13 08/12/2013   Pneumococcal Polysaccharide-23 02/06/2012   Td 11/24/2005   Tdap 08/04/2012       Objective:   Physical Exam BP 122/78 (BP Location: Left Arm, Cuff Size: Normal)   Pulse 80   Temp 97.7 F (36.5 C) (Temporal)   Ht 5\' 9"  (1.753 m)   Wt 170 lb 12.8 oz (77.5 kg)   SpO2 97%   BMI 25.22 kg/m  GENERAL: Well-developed, well-nourished gentleman, no acute distress.  Fully ambulatory.  No conversational dyspnea. HEAD: Normocephalic,  atraumatic.  EYES: Pupils equal, round, reactive to light.  No scleral icterus.  MOUTH: Nose/mouth/throat not examined due to masking requirements for COVID 19. NECK: Supple. No thyromegaly. Trachea midline. No JVD.  No adenopathy. PULMONARY: Good air entry bilaterally.  No adventitious sounds. CARDIOVASCULAR: S1 and S2. Regular rate and rhythm.  No rubs, murmurs or gallops heard. ABDOMEN: Benign. MUSCULOSKELETAL: No joint deformity, there is clubbing, no edema.  NEUROLOGIC: No focal deficit, no gait disturbance, speech is fluent. SKIN: Intact,warm,dry. PSYCH: Mood and behavior normal.  Representative images of CT performed 26 January 2021       Representative image of the CT scan of the chest obtained 11 February 2021:    Assessment & Plan:  ICD-10-CM   1. 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 Mediastinal adenopathy  R59.0    Malignant lymphadenopathy suspected Bronchoscopy with endobronchial ultrasound for diagnosis    3. Moderate COPD (chronic obstructive pulmonary disease) (HCC)  J44.9    On Spiriva Low symptom burden    4. Chronic systolic heart failure ([place])  I50.22    Per cardiology notes in September, patient well compensated Last LVEF 45 to 50%    5. Tobacco dependence due to cigarettes  F17.210    Patient counseled regards to discontinuation of smoking     Patient has recurrence of carcinoma until proven otherwise.  Suspect that this is non-small cell lung cancer.  We will proceed with bronchoscopy with endobronchial ultrasound.  Patient is aware of the potential benefits, limitations and complications of the procedure and agrees to go ahead.  Benefits, limitations and potential complications of the procedure were discussed with the patient/family.  Complications from bronchoscopy are rare and most  often minor, but if they occur they may include breathing difficulty, vocal cord spasm, hoarseness, slight fever, vomiting, dizziness, bronchospasm, infection, low blood oxygen, bleeding from biopsy site, or an allergic reaction to medications.  It is uncommon for patients to experience other more serious complications for example: Collapsed lung requiring chest tube placement, respiratory failure, heart attack and/or cardiac arrhythmia.  Patient has been scheduled for 06 March 2021 at 12:30 PM.  He will need to withhold Plavix for 5 days prior to the procedure.  He does not need to hold baby aspirin (81 mg) prior to the procedure.   Renold Don, MD Advanced Bronchoscopy PCCM [facility]    *This note was dictated using voice recognition software/Dragon.  Despite best efforts to proofread, errors can occur which can change the meaning.  Any change was purely unintentional.

## 2021-02-21 NOTE — Telephone Encounter (Signed)
Phone pre admit visit 02/26/2021 between 8-1 and covid test 03/04/21 at 8:25. Patient is aware of date/time and voiced his understanding.  Nothing further needed.

## 2021-02-21 NOTE — Patient Instructions (Addendum)
We are scheduling your biopsy procedure on the 16 November.  That would be a Wednesday.  We will see her in follow-up in 3 to 4 weeks time with either me or the nurse practitioner.

## 2021-02-21 NOTE — Progress Notes (Addendum)
Subjective:    Patient ID: Philip Wood, male    DOB: 1945-05-23, 75 y.o.   MRN: [id number] Chief Complaint  Patient presents with   Follow-up    C/o dry cough at times prod with white sputum and occ wheezing.    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  Patient had negative navigational bronchoscopy at that time.  Has been monitored closely by medical and radiation oncology.  He is kindly referred by Dr. [name] and Dr. Earlie Server for evaluation for bronchoscopy with endobronchial ultrasound due to right hilar mass and nodal metastasis.  The patient had had a new mass noted on the right hilar region and underwent SBRT 1 year prior.  On monitoring CT it was found that this area had grown indicating progression of disease.  It appears the tumor also invaded the mediastinum.  CT showed hypermetabolic central right upper lobe perihilar recurrence.  He has not had hemoptysis nor chest pain.  He has had nonproductive cough.  No fevers, chills or sweats.  He has noted weight loss but states that this is happened since his wife passed away a month ago he states he is not eating well due to this.  Recently had carotid stent placement (12 October) without incident or sequela.  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  He has moderate COPD and is on Spiriva which she feels gives him good control.   Review of Systems A 10 point review of systems was performed and it is as noted above otherwise negative.  Past Medical History:  Diagnosis Date   Bruit of left carotid artery    Cancer ([place])    laryngeal   Chronic heart failure (HCC)    Chronic kidney disease    COPD (chronic obstructive pulmonary disease) (HCC)    Coronary artery disease    Diabetes mellitus without complication (HCC)    GERD (gastroesophageal reflux  disease)    Headache    History of kidney stones    Hyperlipidemia    Hypertension    Lumbago    Neuropathy    Tobacco abuse disorder    Past Surgical History:  Procedure Laterality Date   ARTERY BIOPSY Right 11/14/2020   Procedure: BIOPSY TEMPORAL ARTERY;  Surgeon: Katha Cabal, MD;  Location: [facility];  Service: Vascular;  Laterality: Right;   CAROTID PTA/STENT INTERVENTION Left 01/30/2021   Procedure: CAROTID PTA/STENT INTERVENTION;  Surgeon: Katha Cabal, MD;  Location: [facility];  Service: Cardiovascular;  Laterality: Left;   CHOLECYSTECTOMY     ELECTROMAGNETIC NAVIGATION BROCHOSCOPY N/A 08/19/2017   Procedure: ELECTROMAGNETIC NAVIGATION BRONCHOSCOPY;  Surgeon: Flora Lipps, MD;  Location: [facility];  Service: Cardiopulmonary;  Laterality: N/A;   ELECTROMAGNETIC NAVIGATION BROCHOSCOPY Left 09/02/2017   Procedure: ELECTROMAGNETIC NAVIGATION BRONCHOSCOPY;  Surgeon: Flora Lipps, MD;  Location: [facility];  Service: Cardiopulmonary;  Laterality: Left;   THROAT SURGERY     Family History  Problem Relation Age of Onset   Heart disease Father    Heart attack Father    Brain cancer Sister    Cervical cancer Sister    Stomach cancer Sister    Stomach cancer Sister    Diabetes Sister    Lupus Sister    Rectal cancer Brother    Lung cancer Brother  Lung cancer Brother    Lung cancer Brother    Lung cancer Brother    Social History   Tobacco Use   Smoking status: Light Smoker    Packs/day: 1.00    Years: 53.00    Pack years: 53.00    Types: Cigarettes   Smokeless tobacco: Never   Tobacco comments:    Aprrox 10 cigs/day--02/21/2021  Substance Use Topics   Alcohol use: No    Alcohol/week: 0.0 standard drinks   No Known Allergies Current Meds  Medication Sig   acetaminophen (TYLENOL) 500 MG tablet Take 500-1,000 mg by mouth every 6 (six) hours as needed (for pain).   allopurinol (ZYLOPRIM) 100 MG tablet Take 1 tablet (100 mg total) by mouth daily.  (Patient taking differently: Take 100 mg by mouth in the morning.)   aspirin EC 81 MG tablet Take 81 mg by mouth in the morning. Swallow whole.   atorvastatin (LIPITOR) 80 MG tablet Take 1 tablet (80 mg total) by mouth daily at 6 PM.   clopidogrel (PLAVIX) 75 MG tablet Take 1 tablet (75 mg total) by mouth daily.   metFORMIN (GLUCOPHAGE) 500 MG tablet Take 1 tablet (500 mg total) by mouth 2 (two) times daily with a meal.   metoprolol succinate (TOPROL XL) 25 MG 24 hr tablet Take 1 tablet (25 mg total) by mouth daily. (Patient taking differently: Take 25 mg by mouth in the morning.)   mirtazapine (REMERON) 7.5 MG tablet Take 1 tablet (7.5 mg total) by mouth at bedtime.   ramipril (ALTACE) 10 MG capsule Take 1 capsule (10 mg total) by mouth daily. (Patient taking differently: Take 10 mg by mouth in the morning.)   tiotropium (SPIRIVA HANDIHALER) 18 MCG inhalation capsule PLACE 1 CAPSULE INTO INHALER AND INHALE ITS CONTENTS ONCE DAILY. (Patient taking differently: Place 18 mcg into inhaler and inhale in the morning.)   vitamin B-12 (CYANOCOBALAMIN) 1000 MCG tablet Take 1,000 mcg by mouth daily.   Immunization History  Administered Date(s) Administered   Fluad Quad(high Dose 65+) 01/08/2021   Influenza, High Dose Seasonal PF 01/11/2016, 01/12/2017, 02/12/2018, 01/25/2019   Influenza,inj,Quad PF,6+ Mos 03/28/2015   Influenza-Unspecified 01/17/2020   Moderna Sars-Covid-2 Vaccination 06/18/2019, 07/16/2019, 02/15/2020, 08/10/2020   Pneumococcal Conjugate-13 08/12/2013   Pneumococcal Polysaccharide-23 02/06/2012   Td 11/24/2005   Tdap 08/04/2012       Objective:   Physical Exam BP 122/78 (BP Location: Left Arm, Cuff Size: Normal)   Pulse 80   Temp 97.7 F (36.5 C) (Temporal)   Ht 5\' 9"  (1.753 m)   Wt 170 lb 12.8 oz (77.5 kg)   SpO2 97%   BMI 25.22 kg/m  GENERAL: Well-developed, well-nourished gentleman, no acute distress.  Fully ambulatory.  No conversational dyspnea. HEAD: Normocephalic,  atraumatic.  EYES: Pupils equal, round, reactive to light.  No scleral icterus.  MOUTH: Nose/mouth/throat not examined due to masking requirements for COVID 19. NECK: Supple. No thyromegaly. Trachea midline. No JVD.  No adenopathy. PULMONARY: Good air entry bilaterally.  No adventitious sounds. CARDIOVASCULAR: S1 and S2. Regular rate and rhythm.  No rubs, murmurs or gallops heard. ABDOMEN: Benign. MUSCULOSKELETAL: No joint deformity, there is clubbing, no edema.  NEUROLOGIC: No focal deficit, no gait disturbance, speech is fluent. SKIN: Intact,warm,dry. PSYCH: Mood and behavior normal.  Representative images of CT performed 26 January 2021       Representative image of the CT scan of the chest obtained 11 February 2021:    Assessment & Plan:  ICD-10-CM   1. 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 Mediastinal adenopathy  R59.0    Malignant lymphadenopathy suspected Bronchoscopy with endobronchial ultrasound for diagnosis    3. Moderate COPD (chronic obstructive pulmonary disease) (HCC)  J44.9    On Spiriva Low symptom burden    4. Chronic systolic heart failure ([place])  I50.22    Per cardiology notes in September, patient well compensated Last LVEF 45 to 50%    5. Tobacco dependence due to cigarettes  F17.210    Patient counseled regards to discontinuation of smoking     Patient has recurrence of carcinoma until proven otherwise.  Suspect that this is non-small cell lung cancer.  We will proceed with bronchoscopy with endobronchial ultrasound.  Patient is aware of the potential benefits, limitations and complications of the procedure and agrees to go ahead.  Benefits, limitations and potential complications of the procedure were discussed with the patient/family.  Complications from bronchoscopy are rare and most  often minor, but if they occur they may include breathing difficulty, vocal cord spasm, hoarseness, slight fever, vomiting, dizziness, bronchospasm, infection, low blood oxygen, bleeding from biopsy site, or an allergic reaction to medications.  It is uncommon for patients to experience other more serious complications for example: Collapsed lung requiring chest tube placement, respiratory failure, heart attack and/or cardiac arrhythmia.  Patient has been scheduled for 06 March 2021 at 12:30 PM.  He will need to withhold Plavix for 5 days prior to the procedure.  He does not need to hold baby aspirin (81 mg) prior to the procedure.   Renold Don, MD Advanced Bronchoscopy PCCM [facility]    *This note was dictated using voice recognition software/Dragon.  Despite best efforts to proofread, errors can occur which can change the meaning.  Any change was purely unintentional.

## 2021-02-21 NOTE — Telephone Encounter (Signed)
EBUS scheduled for 03/06/2021 at 12:30. [id number], [id number] [id number] mass

## 2021-02-26 ENCOUNTER — Other Ambulatory Visit
Admission: RE | Admit: 2021-02-26 | Discharge: 2021-02-26 | Disposition: A | Discharge status: Home / Self Care | Payer: Medicare Other | Source: Ambulatory Visit | Attending: Pulmonary Disease | Admitting: Pulmonary Disease

## 2021-02-26 ENCOUNTER — Other Ambulatory Visit: Payer: Self-pay

## 2021-02-26 HISTORY — DX: Unspecified osteoarthritis, unspecified site: M19.90

## 2021-02-26 NOTE — Patient Instructions (Addendum)
Your procedure is scheduled on: Wednesday 03/06/21 Report to the Registration Desk on the 1st floor of the [facility]. To find out your arrival time, please call [phone] between 1PM - 3PM on: Tuesday 03/05/21  REMEMBER: Instructions that are not followed completely may result in serious medical risk, up to and including death; or upon the discretion of your surgeon and anesthesiologist your surgery may need to be rescheduled.  Do not eat food or drink after midnight the night before surgery.  No gum chewing, lozengers or hard candies.  TAKE THESE MEDICATIONS THE MORNING OF SURGERY WITH A SIP OF WATER: metoprolol succinate (TOPROL XL) 25 MG 24 hr tablet  Please use your tiotropium (SPIRIVA HANDIHALER) 18 MCG inhalation capsule on the day of surgery and bring to the hospital.   Please stop your Metformin 2 days prior to procedure. Take it Sunday and no more until after surgery.  Please hold Plavix for 5 days per Dr. Patsey Berthold plan. Take on Thursday and no more until after surgery.   One week prior to surgery: Stop Anti-inflammatories (NSAIDS) such as Advil, Aleve, Ibuprofen, Motrin, Naproxen, Naprosyn and Aspirin based products such as Excedrin, Goodys Powder, BC Powder. Stop taking your vitamin B-12 (CYANOCOBALAMIN) 1000 MCG tablet and ANY OVER THE COUNTER supplements until after surgery. You may however, continue to take Tylenol if needed for pain up until the day of surgery.  No Alcohol for 24 hours before or after surgery.  No Smoking including e-cigarettes for 24 hours prior to surgery.  No chewable tobacco products for at least 6 hours prior to surgery.  No nicotine patches on the day of surgery.  Do not use any "recreational" drugs for at least a week prior to your surgery.  Please be advised that the combination of cocaine and anesthesia may have negative outcomes, up to and including death. If you test positive for cocaine, your surgery will be cancelled.  On the  morning of surgery brush your teeth with toothpaste and water, you may rinse your mouth with mouthwash if you wish. Do not swallow any toothpaste or mouthwash.  Do not wear jewelry.  Do not wear lotions, powders, or colognes.   Do not shave body from the neck down 48 hours prior to surgery just in case you cut yourself which could leave a site for infection.  Dentures may not be worn into surgery.  Do not bring valuables to the hospital. [facility] is not responsible for any missing/lost belongings or valuables.   Notify your doctor if there is any change in your medical condition (cold, fever, infection).  Wear comfortable clothing (specific to your surgery type) to the hospital.  After surgery, you can help prevent lung complications by doing breathing exercises.  Take deep breaths and cough every 1-2 hours. When coughing or sneezing, hold a pillow firmly against your incision with both hands.   If you are being discharged the day of surgery, you will not be allowed to drive home. You will need a responsible adult (18 years or older) to drive you home and stay with you that night.   If you are taking public transportation, you will need to have a responsible adult (18 years or older) with you. Please confirm with your physician that it is acceptable to use public transportation.   Please call the [facility] at [phone] if you have any questions about these instructions.  Surgery Visitation Policy:  Patients undergoing a surgery or procedure may have  one family member or support person with them as long as that person is not COVID-19 positive or experiencing its symptoms.  That person may remain in the waiting area during the procedure and may rotate out with other people.  Inpatient Visitation:    Visiting hours are 7 a.m. to 8 p.m. Up to two visitors ages 16+ are allowed at one time in a patient room. The visitors may rotate out with other people during  the day. Visitors must check out when they leave, or other visitors will not be allowed. One designated support person may remain overnight. The visitor must pass COVID-19 screenings, use hand sanitizer when entering and exiting the patient's room and wear a mask at all times, including in the patient's room. Patients must also wear a mask when staff or their visitor are in the room. Masking is required regardless of vaccination status.

## 2021-03-04 ENCOUNTER — Other Ambulatory Visit
Admission: RE | Admit: 2021-03-04 | Discharge: 2021-03-04 | Disposition: A | Discharge status: Home / Self Care | Payer: Medicare Other | Source: Ambulatory Visit | Attending: Pulmonary Disease | Admitting: Pulmonary Disease

## 2021-03-04 ENCOUNTER — Other Ambulatory Visit: Payer: Self-pay

## 2021-03-04 DIAGNOSIS — Z20822 Contact with and (suspected) exposure to covid-19: Secondary | ICD-10-CM | POA: Insufficient documentation

## 2021-03-04 DIAGNOSIS — Z01812 Encounter for preprocedural laboratory examination: Secondary | ICD-10-CM | POA: Diagnosis not present

## 2021-03-04 LAB — SARS CORONAVIRUS 2 (TAT 6-24 HRS): SARS Coronavirus 2: NEGATIVE

## 2021-03-05 ENCOUNTER — Ambulatory Visit (INDEPENDENT_AMBULATORY_CARE_PROVIDER_SITE_OTHER): Payer: Medicare Other | Admitting: Nurse Practitioner

## 2021-03-05 ENCOUNTER — Other Ambulatory Visit: Payer: Self-pay

## 2021-03-05 ENCOUNTER — Encounter: Payer: Self-pay | Admitting: Nurse Practitioner

## 2021-03-05 VITALS — BP 121/73 | HR 83 | Temp 97.4°F | Wt 169.2 lb

## 2021-03-05 DIAGNOSIS — F4321 Adjustment disorder with depressed mood: Secondary | ICD-10-CM | POA: Diagnosis not present

## 2021-03-05 DIAGNOSIS — C3401 Malignant neoplasm of right main bronchus: Secondary | ICD-10-CM | POA: Diagnosis not present

## 2021-03-05 DIAGNOSIS — F17219 Nicotine dependence, cigarettes, with unspecified nicotine-induced disorders: Secondary | ICD-10-CM

## 2021-03-05 NOTE — Assessment & Plan Note (Signed)
Continue collaboration with oncology.  Recommend complete cessation of smoking.

## 2021-03-05 NOTE — Progress Notes (Signed)
BP 121/73   Pulse 83   Temp (!) 97.4 F (36.3 C) (Oral)   Wt 169 lb 3.2 oz (76.7 kg)   SpO2 98%   BMI 24.99 kg/m    Subjective:    Patient ID: Philip Wood, male    DOB: 09-02-1945, 75 y.o.   MRN: [id number]  HPI: Philip Wood is a 75 y.o. male  Chief Complaint  Patient presents with   Grieving    Patient states he is doing pretty good and states he is scheduled to go and have a biopsy. Patient denies having any concerns at today's visit.    GRIEVING Taking Mirtazapine 7.5 MG nightly for appetite and mood, started on 01/08/21 due to recent loss of wife -- had less appetite after the loss.  Maintaining weight at this time.  Eats about 2 meals a day at baseline -- but appetite improved and is eating more snacks between meals.  Has good support system nearby -- two step daughters and one daughter.  Is scheduled for video bronchoscopy tomorrow with Dr. 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  Continues to smoke, but is cutting back. Mood status: stable Satisfied with current treatment?: yes Symptom severity: mild  Duration of current treatment : months Side effects: no Medication compliance: good compliance Psychotherapy/counseling: none Previous psychiatric medications: none Depressed mood: no Anxious mood: no Anhedonia: no Significant weight loss or gain: no Insomnia: none Fatigue: no Feelings of worthlessness or guilt: no Impaired concentration/indecisiveness: no Suicidal ideations: no Hopelessness: no Crying spells: no Depression screen [facility] 2/9 03/05/2021 01/08/2021 08/31/2020 05/31/2020 11/29/2019  Decreased Interest 0 0 0 0 0  Down, Depressed, Hopeless 0 0 0 0 0  PHQ - 2 Score 0 0 0 0 0  Altered sleeping 0 0 - 0 -  Tired, decreased energy 0 0 - 0 -  Change in appetite 0 2 - 0 -  Feeling bad or failure about yourself  0 0 - 0 -  Trouble concentrating 0 0 - 0 -  Moving slowly or fidgety/restless 0 0 - 0 -  Suicidal  thoughts 0 0 - 0 -  PHQ-9 Score 0 2 - 0 -  Difficult doing work/chores Not difficult at all Somewhat difficult - - -    GAD 7 : Generalized Anxiety Score 03/05/2021  Nervous, Anxious, on Edge 0  Control/stop worrying 0  Worry too much - different things 0  Trouble relaxing 0  Restless 0  Easily annoyed or irritable 0  Afraid - awful might happen 0  Total GAD 7 Score 0    Relevant past medical, surgical, family and social history reviewed and updated as indicated. Interim medical history since our last visit reviewed. Allergies and medications reviewed and updated.  Review of Systems  Constitutional:  Negative for activity change, diaphoresis, fatigue and fever.  Respiratory:  Negative for cough, chest tightness, shortness of breath and wheezing.   Cardiovascular:  Negative for chest pain, palpitations and leg swelling.  Gastrointestinal: Negative.   Endocrine: Negative for cold intolerance, heat intolerance, polydipsia, polyphagia and polyuria.  Neurological: Negative.   Psychiatric/Behavioral: Negative.     Per HPI unless specifically indicated above     Objective:    BP 121/73   Pulse 83   Temp (!) 97.4 F (36.3 C) (Oral)   Wt 169 lb 3.2 oz (76.7 kg)   SpO2 98%   BMI 24.99 kg/m   Wt Readings from Last 3 Encounters:  03/05/21 169 lb 3.2 oz (76.7 kg)  02/26/21 170 lb (77.1 kg)  02/21/21 170 lb 12.8 oz (77.5 kg)    Physical Exam Vitals and nursing note reviewed.  Constitutional:      General: He is awake. He is not in acute distress.    Appearance: He is well-developed and well-groomed. He is not ill-appearing or toxic-appearing.  HENT:     Head: Normocephalic and atraumatic.     Right Ear: Hearing, ear canal and external ear normal.     Left Ear: Hearing, ear canal and external ear normal.     Nose:     Right Sinus: No maxillary sinus tenderness or frontal sinus tenderness.     Left Sinus: No maxillary sinus tenderness or frontal sinus tenderness.  Eyes:      General: Lids are normal.        Right eye: No discharge.        Left eye: No discharge.     Extraocular Movements: Extraocular movements intact.     Conjunctiva/sclera: Conjunctivae normal.     Pupils: Pupils are equal, round, and reactive to light.     Visual Fields: Right eye visual fields normal and left eye visual fields normal.  Neck:     Thyroid: No thyromegaly.     Vascular: No carotid bruit.  Cardiovascular:     Rate and Rhythm: Normal rate and regular rhythm.     Heart sounds: Normal heart sounds, S1 normal and S2 normal. No murmur heard.   No gallop.  Pulmonary:     Effort: Pulmonary effort is normal. No accessory muscle usage or respiratory distress.     Breath sounds: Decreased breath sounds and wheezing present.     Comments: Decreased sounds throughout with expiratory wheezes per baseline. Abdominal:     General: Bowel sounds are normal.     Palpations: Abdomen is soft.  Musculoskeletal:        General: Normal range of motion.     Cervical back: Normal range of motion and neck supple.     Right lower leg: No edema.     Left lower leg: No edema.  Lymphadenopathy:     Cervical: No cervical adenopathy.  Skin:    General: Skin is warm and dry.  Neurological:     Mental Status: He is alert and oriented to person, place, and time.     Motor: Motor function is intact.     Coordination: Coordination is intact.     Gait: Gait is intact.     Deep Tendon Reflexes: Reflexes are normal and symmetric.     Reflex Scores:      Brachioradialis reflexes are 2+ on the right side and 2+ on the left side.      Patellar reflexes are 2+ on the right side and 2+ on the left side. Psychiatric:        Attention and Perception: Attention normal.        Mood and Affect: Mood normal.        Speech: Speech normal.        Behavior: Behavior normal. Behavior is cooperative.        Thought Content: Thought content normal.    Results for orders placed or performed during the hospital  encounter of 03/04/21  SARS CORONAVIRUS 2 (TAT 6-24 HRS) Nasopharyngeal Nasopharyngeal Swab   Specimen: Nasopharyngeal Swab  Result Value Ref Range   SARS Coronavirus 2 NEGATIVE NEGATIVE      Assessment & Plan:  Problem List Items Addressed This Visit       Respiratory   Lung cancer ([place]) - Primary    Continue collaboration with oncology.  Recommend complete cessation of smoking.        Nervous and Auditory   Nicotine dependence, cigarettes, w unsp disorders    I have recommended complete cessation of tobacco use. I have discussed various options available for assistance with tobacco cessation including over the counter methods (Nicotine gum, patch and lozenges). We also discussed prescription options (Chantix, Nicotine Inhaler / Nasal Spray). The patient is not interested in pursuing any prescription tobacco cessation options at this time.         Other   Grieving    Recent loss of wife, at this time is maintaining weight with Mirtazapine on board. Will continue Mirtazapine 7.5 MG nightly, educated him on this medication and use for mood and appetite.  Recommend he drink 1-2 Glucerna daily for protein.  Plan to have return in 8 weeks for follow-up, recommend he reach out to hospice for support group.  If loss of weight noted consider imaging and labs.          Follow up plan: Return in about 8 weeks (around 05/02/2021) for T2DM, HTN/HLD, COPD, LUNG CA, MOOD.

## 2021-03-05 NOTE — Assessment & Plan Note (Signed)
Recent loss of wife, at this time is maintaining weight with Mirtazapine on board. Will continue Mirtazapine 7.5 MG nightly, educated him on this medication and use for mood and appetite.  Recommend he drink 1-2 Glucerna daily for protein.  Plan to have return in 8 weeks for follow-up, recommend he reach out to hospice for support group.  If loss of weight noted consider imaging and labs.

## 2021-03-05 NOTE — Assessment & Plan Note (Signed)
I have recommended complete cessation of tobacco use. I have discussed various options available for assistance with tobacco cessation including over the counter methods (Nicotine gum, patch and lozenges). We also discussed prescription options (Chantix, Nicotine Inhaler / Nasal Spray). The patient is not interested in pursuing any prescription tobacco cessation options at this time.  

## 2021-03-05 NOTE — Patient Instructions (Signed)
Managing Loss, Adult °People experience loss in many different ways throughout their lives. Events such as moving, changing jobs, and losing friends can create a sense of loss. The loss may be as serious as a major health change, divorce, death of a pet, or death of a loved one. All of these types of loss are likely to create a physical and emotional reaction known as grief. Grief is the result of a major change or an absence of something or someone that you count on. Grief is a normal reaction to loss. °A variety of factors can affect your grieving experience, including: °The nature of your loss. °Your relationship to what or whom you lost. °Your understanding of grief and how to manage it. °Your support system. °Be aware that when grief becomes extreme, it can lead to more severe issues like isolation, depression, anxiety, or suicidal thoughts. Talk with your health care provider if you have any of these issues. °How to manage lifestyle changes °Keep to your normal routine as much as possible. °If you have trouble focusing or doing normal activities, it is acceptable to take some time away from your normal routine. °Spend time with friends and loved ones. °Eat a healthy diet, get plenty of sleep, and rest when you feel tired. °How to recognize changes  °The way that you deal with your grief will affect your ability to function as you normally do. When grieving, you may experience these changes: °Numbness, shock, sadness, anxiety, anger, denial, and guilt. °Thoughts about death. °Unexpected crying. °A physical sensation of emptiness in your stomach. °Problems sleeping and eating. °Tiredness (fatigue). °Loss of interest in normal activities. °Dreaming about or imagining seeing the person who died. °A need to remember what or whom you lost. °Difficulty thinking about anything other than your loss for a period of time. °Relief. If you have been expecting the loss for a while, you may feel a sense of relief when it  happens. °Follow these instructions at home: °Activity °Express your feelings in healthy ways, such as: °Talking with others about your loss. It may be helpful to find others who have had a similar loss, such as a support group. °Writing down your feelings in a journal. °Doing physical activities to release stress and emotional energy. °Doing creative activities like painting, sculpting, or playing or listening to music. °Practicing resilience. This is the ability to recover and adjust after facing challenges. Reading some resources that encourage resilience may help you to learn ways to practice those behaviors. ° °General instructions °Be patient with yourself and others. Allow the grieving process to happen, and remember that grieving takes time. °It is likely that you may never feel completely done with some grief. You may find a way to move on while still cherishing memories and feelings about your loss. °Accepting your loss is a process. It can take months or longer to adjust. °Keep all follow-up visits. This is important. °Where to find support °To get support for managing loss: °Ask your health care provider for help and recommendations, such as grief counseling or therapy. °Think about joining a support group for people who are managing a loss. °Where to find more information °You can find more information about managing loss from: °American Society of Clinical Oncology: www.cancer.net °American Psychological Association: www.apa.org °Contact a health care provider if: °Your grief is extreme and keeps getting worse. °You have ongoing grief that does not improve. °Your body shows symptoms of grief, such as illness. °You feel depressed, anxious, or   hopeless. °Get help right away if: °You have thoughts about hurting yourself or others. °Get help right away if you feel like you may hurt yourself or others, or have thoughts about taking your own life. Go to your nearest emergency room or: °Call 911. °Call the  National Suicide Prevention Lifeline at 1-800-273-8255 or 988. This is open 24 hours a day. °Text the Crisis Text Line at 741741. °Summary °Grief is the result of a major change or an absence of someone or something that you count on. Grief is a normal reaction to loss. °The depth of grief and the period of recovery depend on the type of loss and your ability to adjust to the change and process your feelings. °Processing grief requires patience and a willingness to accept your feelings and talk about your loss with people who are supportive. °It is important to find resources that work for you and to realize that people experience grief differently. There is not one grieving process that works for everyone in the same way. °Be aware that when grief becomes extreme, it can lead to more severe issues like isolation, depression, anxiety, or suicidal thoughts. Talk with your health care provider if you have any of these issues. °This information is not intended to replace advice given to you by your health care provider. Make sure you discuss any questions you have with your health care provider. °Document Revised: 11/26/2020 Document Reviewed: 11/26/2020 °Elsevier Patient Education © 2022 [employer] ° °

## 2021-03-06 ENCOUNTER — Other Ambulatory Visit: Payer: Self-pay

## 2021-03-06 ENCOUNTER — Ambulatory Visit: Payer: Medicare Other | Admitting: Anesthesiology

## 2021-03-06 ENCOUNTER — Ambulatory Visit
Admission: RE | Admit: 2021-03-06 | Discharge: 2021-03-06 | Disposition: A | Discharge status: Home / Self Care | Payer: Medicare Other | Source: Ambulatory Visit | Attending: Pulmonary Disease | Admitting: Pulmonary Disease

## 2021-03-06 ENCOUNTER — Encounter
Admission: RE | Disposition: A | Discharge status: Home / Self Care | Payer: Self-pay | Source: Ambulatory Visit | Attending: Pulmonary Disease

## 2021-03-06 ENCOUNTER — Encounter: Payer: Self-pay | Admitting: Pulmonary Disease

## 2021-03-06 ENCOUNTER — Ambulatory Visit: Payer: Medicare Other

## 2021-03-06 DIAGNOSIS — R590 Localized enlarged lymph nodes: Secondary | ICD-10-CM | POA: Diagnosis not present

## 2021-03-06 DIAGNOSIS — I5022 Chronic systolic (congestive) heart failure: Secondary | ICD-10-CM | POA: Diagnosis not present

## 2021-03-06 DIAGNOSIS — E785 Hyperlipidemia, unspecified: Secondary | ICD-10-CM | POA: Diagnosis not present

## 2021-03-06 DIAGNOSIS — C3401 Malignant neoplasm of right main bronchus: Secondary | ICD-10-CM | POA: Insufficient documentation

## 2021-03-06 DIAGNOSIS — F17210 Nicotine dependence, cigarettes, uncomplicated: Secondary | ICD-10-CM | POA: Insufficient documentation

## 2021-03-06 DIAGNOSIS — J449 Chronic obstructive pulmonary disease, unspecified: Secondary | ICD-10-CM | POA: Diagnosis not present

## 2021-03-06 DIAGNOSIS — R918 Other nonspecific abnormal finding of lung field: Secondary | ICD-10-CM

## 2021-03-06 DIAGNOSIS — Z9889 Other specified postprocedural states: Secondary | ICD-10-CM

## 2021-03-06 DIAGNOSIS — R911 Solitary pulmonary nodule: Secondary | ICD-10-CM | POA: Diagnosis not present

## 2021-03-06 HISTORY — PX: VIDEO BRONCHOSCOPY WITH ENDOBRONCHIAL ULTRASOUND: SHX6177

## 2021-03-06 LAB — GLUCOSE, CAPILLARY
Glucose-Capillary: 105 mg/dL — ABNORMAL HIGH (ref 70–99)
Glucose-Capillary: 105 mg/dL — ABNORMAL HIGH (ref 70–99)

## 2021-03-06 IMAGING — DX DG CHEST 1V PORT
1 series · 1 of 1 positions shown · non-contrast
Comparison: Chest radiograph from [DATE], CT chest [DATE]

CLINICAL DATA: Status post bronchoscopy

EXAM:
PORTABLE CHEST 1 VIEW

[chest ap]
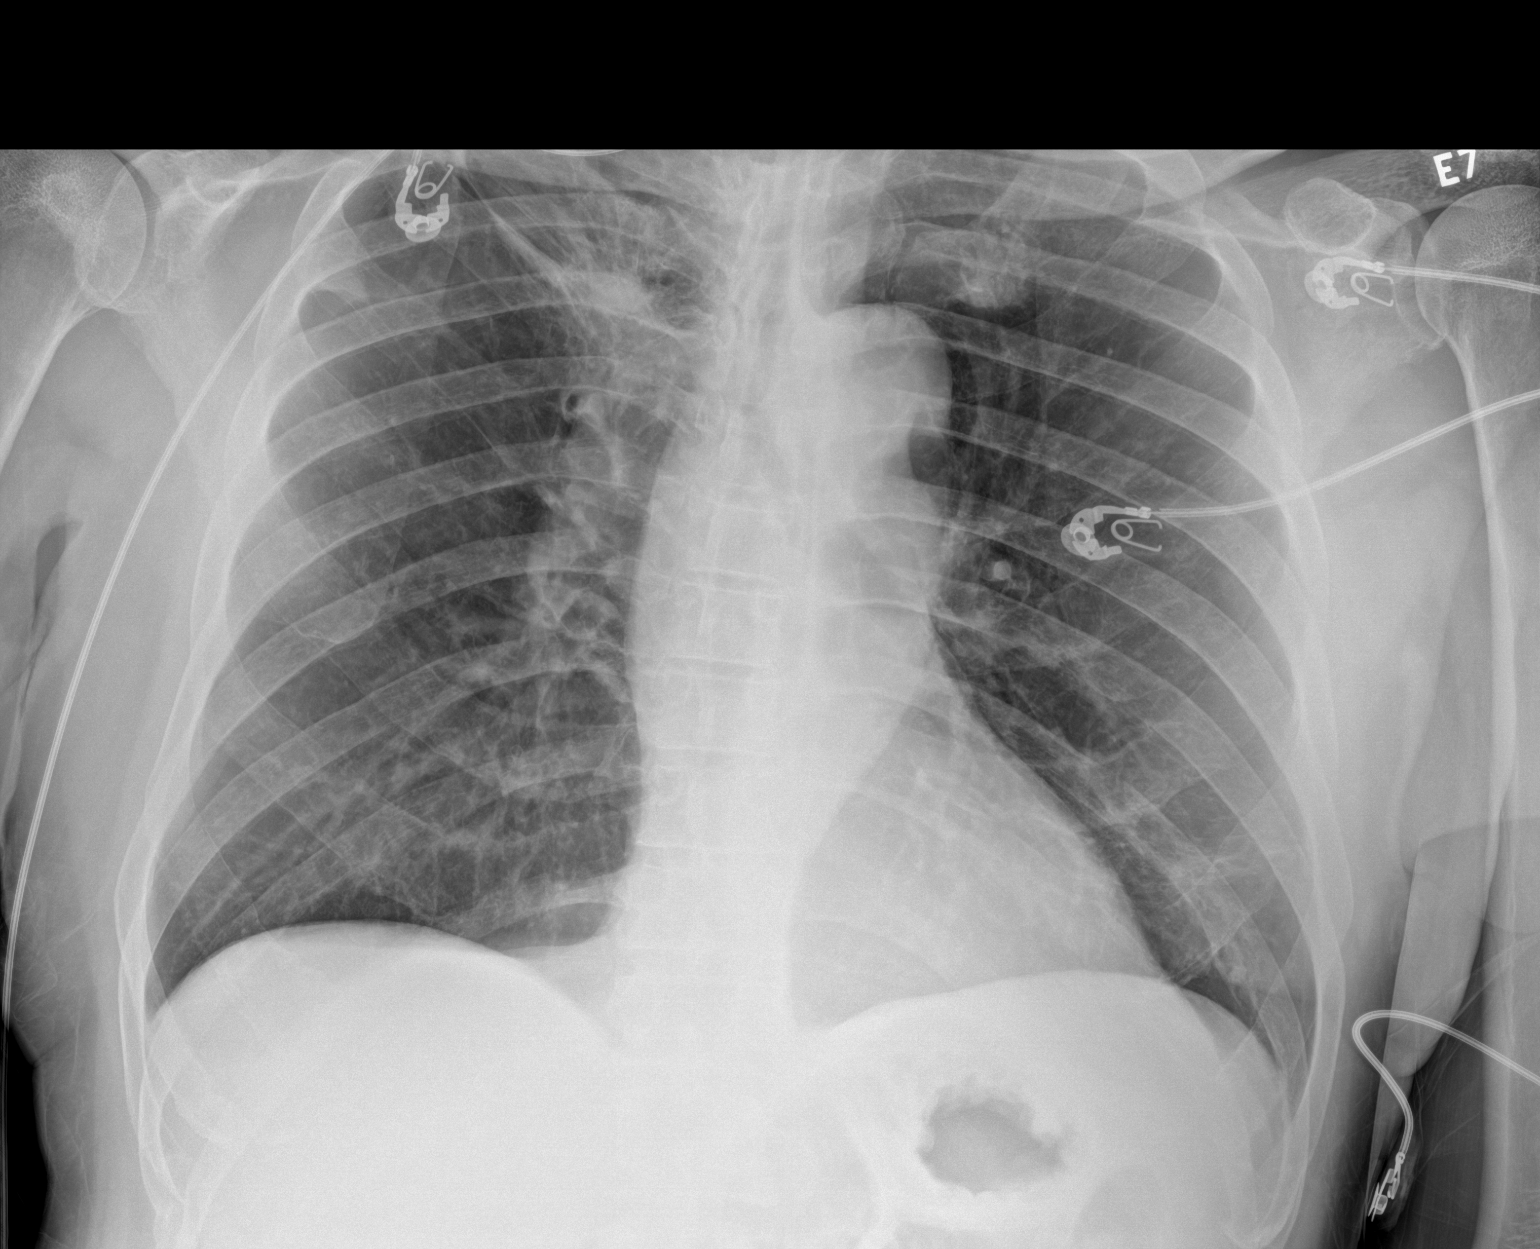

[1 of 1 positions shown; findings below may reference images not displayed]

FINDINGS: The cardiomediastinal silhouette is normal.

Is peculiar did mass is seen in the right suprahilar region with
surrounding architectural distortion as seen on the recent CT chest.
There is no evidence of pneumothorax following bronchoscopy. The
lungs are otherwise clear. There is no pleural effusion.

There is no acute osseous abnormality.
IMPRESSION: 1. No evidence of pneumothorax following bronchoscopy.
2. Right upper lobe mass as seen on prior chest CT.

## 2021-03-06 SURGERY — BRONCHOSCOPY, WITH EBUS
Anesthesia: General

## 2021-03-06 MED ORDER — CHLORHEXIDINE GLUCONATE 0.12 % MT SOLN
OROMUCOSAL | Status: AC
  Administered 2021-03-06: 15 mL via OROMUCOSAL
  Filled 2021-03-06: qty 15

## 2021-03-06 MED ORDER — SUGAMMADEX SODIUM 200 MG/2ML IV SOLN
INTRAVENOUS | Status: DC | PRN
Start: 2021-03-06 — End: 2021-03-06
  Administered 2021-03-06: 200 mg via INTRAVENOUS

## 2021-03-06 MED ORDER — FENTANYL CITRATE (PF) 100 MCG/2ML IJ SOLN: 25.0000 ug | INTRAMUSCULAR | Status: DC | PRN

## 2021-03-06 MED ORDER — DEXAMETHASONE SODIUM PHOSPHATE 10 MG/ML IJ SOLN
INTRAMUSCULAR | Status: DC | PRN
  Administered 2021-03-06: 10 mg via INTRAVENOUS

## 2021-03-06 MED ORDER — ROCURONIUM BROMIDE 100 MG/10ML IV SOLN
INTRAVENOUS | Status: DC | PRN
Start: 2021-03-06 — End: 2021-03-06
  Administered 2021-03-06: 40 mg via INTRAVENOUS

## 2021-03-06 MED ORDER — SEVOFLURANE IN SOLN
RESPIRATORY_TRACT | Status: AC
  Filled 2021-03-06: qty 250

## 2021-03-06 MED ORDER — CHLORHEXIDINE GLUCONATE 0.12 % MT SOLN: 15.0000 mL | Freq: Once | OROMUCOSAL | Status: AC

## 2021-03-06 MED ORDER — FAMOTIDINE 20 MG PO TABS: 20.0000 mg | ORAL_TABLET | Freq: Once | ORAL | Status: AC

## 2021-03-06 MED ORDER — FAMOTIDINE 20 MG PO TABS
ORAL_TABLET | ORAL | Status: AC
  Administered 2021-03-06: 20 mg via ORAL
  Filled 2021-03-06: qty 1

## 2021-03-06 MED ORDER — ROCURONIUM BROMIDE 10 MG/ML (PF) SYRINGE
PREFILLED_SYRINGE | INTRAVENOUS | Status: AC
  Filled 2021-03-06: qty 10

## 2021-03-06 MED ORDER — SODIUM CHLORIDE 0.9 % IV SOLN: Freq: Once | INTRAVENOUS | Status: AC

## 2021-03-06 MED ORDER — IPRATROPIUM-ALBUTEROL 0.5-2.5 (3) MG/3ML IN SOLN
RESPIRATORY_TRACT | Status: AC
  Administered 2021-03-06: 3 mL via RESPIRATORY_TRACT
  Filled 2021-03-06: qty 3

## 2021-03-06 MED ORDER — ORAL CARE MOUTH RINSE: 15.0000 mL | Freq: Once | OROMUCOSAL | Status: AC

## 2021-03-06 MED ORDER — FENTANYL CITRATE (PF) 100 MCG/2ML IJ SOLN
INTRAMUSCULAR | Status: DC | PRN
  Administered 2021-03-06: 100 ug via INTRAVENOUS

## 2021-03-06 MED ORDER — LIDOCAINE HCL (CARDIAC) PF 100 MG/5ML IV SOSY
PREFILLED_SYRINGE | INTRAVENOUS | Status: DC | PRN
  Administered 2021-03-06: 100 mg via INTRAVENOUS

## 2021-03-06 MED ORDER — PROPOFOL 10 MG/ML IV BOLUS
INTRAVENOUS | Status: AC
  Filled 2021-03-06: qty 20

## 2021-03-06 MED ORDER — ONDANSETRON HCL 4 MG/2ML IJ SOLN
INTRAMUSCULAR | Status: DC | PRN
  Administered 2021-03-06: 4 mg via INTRAVENOUS

## 2021-03-06 MED ORDER — LIDOCAINE HCL (PF) 2 % IJ SOLN
INTRAMUSCULAR | Status: AC
  Filled 2021-03-06: qty 5

## 2021-03-06 MED ORDER — FENTANYL CITRATE (PF) 100 MCG/2ML IJ SOLN
INTRAMUSCULAR | Status: AC
  Filled 2021-03-06: qty 2

## 2021-03-06 MED ORDER — LACTATED RINGERS IV SOLN: INTRAVENOUS | Status: DC | PRN

## 2021-03-06 MED ORDER — GLYCOPYRROLATE 0.2 MG/ML IJ SOLN
INTRAMUSCULAR | Status: DC | PRN
  Administered 2021-03-06: .2 mg via INTRAVENOUS

## 2021-03-06 MED ORDER — PROPOFOL 10 MG/ML IV BOLUS
INTRAVENOUS | Status: DC | PRN
  Administered 2021-03-06: 160 mg via INTRAVENOUS

## 2021-03-06 MED ORDER — PHENYLEPHRINE HCL (PRESSORS) 10 MG/ML IV SOLN
INTRAVENOUS | Status: DC | PRN
  Administered 2021-03-06: 100 ug via INTRAVENOUS

## 2021-03-06 MED ORDER — HYDROCODONE-ACETAMINOPHEN 7.5-325 MG PO TABS: 1.0000 | ORAL_TABLET | Freq: Once | ORAL | Status: DC | PRN

## 2021-03-06 MED ORDER — IPRATROPIUM-ALBUTEROL 0.5-2.5 (3) MG/3ML IN SOLN: 3.0000 mL | Freq: Once | RESPIRATORY_TRACT | Status: AC

## 2021-03-06 NOTE — Anesthesia Preprocedure Evaluation (Signed)
Anesthesia Evaluation  Patient identified by MRN, date of birth, ID band Patient awake    Reviewed: Allergy & Precautions, NPO status , Patient's Chart, lab work & pertinent test results  Airway Mallampati: III  TM Distance: >3 FB Neck ROM: full    Dental  (+) Chipped   Pulmonary neg pulmonary ROS, Current Smoker,    Pulmonary exam normal        Cardiovascular hypertension, negative cardio ROS Normal cardiovascular exam     Neuro/Psych negative neurological ROS  negative psych ROS   GI/Hepatic negative GI ROS, Neg liver ROS,   Endo/Other  negative endocrine ROSdiabetes  Renal/GU      Musculoskeletal   Abdominal Normal abdominal exam  (+)   Peds  Hematology negative hematology ROS (+)   Anesthesia Other Findings Past Medical History: No date: Arthritis No date: Bruit of left carotid artery No date: Cancer ([place])     Comment:  laryngeal No date: Chronic heart failure (HCC) No date: Chronic kidney disease No date: COPD (chronic obstructive pulmonary disease) (HCC) No date: Coronary artery disease No date: Diabetes mellitus without complication (HCC) No date: GERD (gastroesophageal reflux disease) No date: Headache No date: History of kidney stones No date: Hyperlipidemia No date: Hypertension No date: Lumbago No date: Neuropathy No date: Tobacco abuse disorder  Past Surgical History: 11/14/2020: ARTERY BIOPSY; Right     Comment:  Procedure: BIOPSY TEMPORAL ARTERY;  Surgeon: Katha Cabal, MD;  Location: [facility] ORS;  Service: Vascular;                Laterality: Right; 01/30/2021: CAROTID PTA/STENT INTERVENTION; Left     Comment:  Procedure: CAROTID PTA/STENT INTERVENTION;  Surgeon:               Katha Cabal, MD;  Location: [facility];               Service: Cardiovascular;  Laterality: Left; No date: CHOLECYSTECTOMY 08/19/2017: ELECTROMAGNETIC NAVIGATION BROCHOSCOPY; N/A      Comment:  Procedure: ELECTROMAGNETIC NAVIGATION BRONCHOSCOPY;                Surgeon: Flora Lipps, MD;  Location: [facility] ORS;  Service:              Cardiopulmonary;  Laterality: N/A; 09/02/2017: ELECTROMAGNETIC NAVIGATION BROCHOSCOPY; Left     Comment:  Procedure: ELECTROMAGNETIC NAVIGATION BRONCHOSCOPY;                Surgeon: Flora Lipps, MD;  Location: [facility] ORS;  Service:              Cardiopulmonary;  Laterality: Left; No date: THROAT SURGERY  BMI    Body Mass Index: 24.97 kg/m      Reproductive/Obstetrics negative OB ROS                             Anesthesia Physical Anesthesia Plan  ASA: 3  Anesthesia Plan: General ETT   Post-op Pain Management:    Induction: Intravenous  PONV Risk Score and Plan: Ondansetron, Dexamethasone, Midazolam and Treatment may vary due to age or medical condition  Airway Management Planned: Oral ETT  Additional Equipment:   Intra-op Plan:   Post-operative Plan: Extubation in OR  Informed Consent: I have reviewed the patients History and Physical, chart, labs and discussed the procedure including the risks, benefits and alternatives  for the proposed anesthesia with the patient or authorized representative who has indicated his/her understanding and acceptance.     Dental Advisory Given  Plan Discussed with: Anesthesiologist, CRNA and Surgeon  Anesthesia Plan Comments: (Patient consented for risks of anesthesia including but not limited to:  - adverse reactions to medications - damage to eyes, teeth, lips or other oral mucosa - nerve damage due to positioning  - sore throat or hoarseness - Damage to heart, brain, nerves, lungs, other parts of body or loss of life  Patient voiced understanding.)        Anesthesia Quick Evaluation

## 2021-03-06 NOTE — Anesthesia Procedure Notes (Signed)
Procedure Name: Intubation Date/Time: 03/06/2021 12:48 PM Performed by: Biagio Borg, CRNA Pre-anesthesia Checklist: Patient identified, Emergency Drugs available, Suction available and Patient being monitored Patient Re-evaluated:Patient Re-evaluated prior to induction Oxygen Delivery Method: Circle system utilized Preoxygenation: Pre-oxygenation with 100% oxygen Induction Type: IV induction Ventilation: Mask ventilation without difficulty Laryngoscope Size: McGraph and 4 Grade View: Grade I Tube type: Oral Tube size: 8.5 mm Number of attempts: 1 Airway Equipment and Method: Stylet and Oral airway Placement Confirmation: ETT inserted through vocal cords under direct vision, positive ETCO2 and breath sounds checked- equal and bilateral Tube secured with: Tape Dental Injury: Teeth and Oropharynx as per pre-operative assessment

## 2021-03-06 NOTE — Interval H&P Note (Signed)
History and Physical Interval Note:  03/06/2021 11:02 AM  Philip Wood  has presented today for surgery, with the diagnosis of LUNG MASS.  The various methods of treatment have been discussed with the patient and family. After consideration of risks, benefits and other options for treatment, the patient has consented to  Procedure(s): [name] (N/A) as a surgical intervention.  The patient's history has been reviewed, patient examined, no change in status, stable for surgery.  I have reviewed the patient's chart and labs.  Questions were answered to the patient's satisfaction.    Renold Don, MD Advanced Bronchoscopy PCCM [facility]

## 2021-03-06 NOTE — Transfer of Care (Addendum)
Immediate Anesthesia Transfer of Care Note  Patient: Philip Wood  Procedure(s) Performed: VIDEO BRONCHOSCOPY WITH ENDOBRONCHIAL ULTRASOUND  Patient Location: PACU  Anesthesia Type:General  Level of Consciousness: awake  Airway & Oxygen Therapy: Patient Spontanous Breathing  Post-op Assessment: Report given to RN  Post vital signs: Reviewed and stable  Last Vitals:  Vitals Value Taken Time  BP    Temp    Pulse    Resp    SpO2      Last Pain:  Vitals:   03/06/21 1425  TempSrc: Temporal  PainSc: 0-No pain         Complications: No notable events documented.

## 2021-03-06 NOTE — Discharge Instructions (Signed)

## 2021-03-06 NOTE — Op Note (Signed)
PROCEDURE: ENDOBRONCHIAL ULTRASOUND   PROCEDURE DATE: 03/06/2021  TIME:  NAME:  Philip Wood  DOB:Apr 27, 1945  MRN: [id number] LOC:  ARPO/None    HOSP DAY: N/A    Indications/Preliminary Diagnosis: Right hilar mass Precarinal and subcarinal adenopathy Prior history of lung cancer  Consent: (Place X beside choice/s below)  The benefits, risks and possible complications of the procedure were        explained to:  _X__ patient  ___ patient's family  ___ other:___________  who verbalized understanding and gave:  ___ verbal  ___ written  _X__ verbal and written  ___ telephone  ___ other:________ consent.      Unable to obtain consent; procedure performed on emergent basis.     Other:    Benefits, limitations and potential complications of the procedure were discussed with the patient/family.  Complications from bronchoscopy are rare and most often minor, but if they occur they may include breathing difficulty, vocal cord spasm, hoarseness, slight fever, vomiting, dizziness, bronchospasm, infection, low blood oxygen, bleeding from biopsy site, or an allergic reaction to medications.  It is uncommon for patients to experience other more serious complications for example: Collapsed lung requiring chest tube placement, respiratory failure, heart attack and/or cardiac arrhythmia.  Patient agrees to proceed.    Anesthesia type: General endotracheal  Surgeon: Renold Don, MD Assistant/Scrub: Liborio Nixon, RRT Anesthesiologist/CRNA: Margaree Mackintosh, DO/Sherri Doy Mince, CRNA Cytotechnology: [facility]   PROCEDURE DETAILS: Patient was taken to Procedure Room 2 (Bronchoscopy Suite) in the OR area.  Appropriate Timeout performed and correct patient, name, ID and laterality confirmed.  Patient was inducted under general anesthesia and intubated with an 8.5 ET tube without difficulty.  Once the patient was under adequate general anesthesia a Portex adapter was placed in the ET tube flange.   Through the Portex adapter the Olympus video therapeutic bronchoscope was then advanced.  The visible distal trachea was normal, carina appeared full.  Examination of the RIGHT tracheobronchial tree showed that few millimeters from the main carina there was a circumferential mass encircling the right mainstem bronchus.  This mass was friable.  There was no endoluminal occlusion.  This extended to the right upper lobe bronchus.  The right middle lobe, lower lobe bronchi were free of endobronchial lesions or any other abnormality.  At this point bronchoscope was brought to the left mainstem and examination revealed no endobronchial lesions on the left tracheobronchial tree.  There were significant inspissated secretions on the left lower lobe and these were lavaged till clear.  Once this was accomplished, the bronchoscope was brought back again to the right mainstem bronchus where the patient had the lesion blanch with 1-10,000 solution of epinephrine, 3 mL were instilled.  At this point brushings were performed x1, ROSE revealed lesional cells.  Once this was completed, the mass was then biopsied x7 with [facility] Scientific biopsy forceps.  There was minimal heme noted during biopsy.  Once this was completed, bronchial lavage of this area was performed with 40 mL of normal saline yielding approximately 10 mL of aliquot placed in CytoLyt for further analysis.  After completing this, the bronchoscope was switched to an endobronchial ultrasound scope (EBUS scope) and the mediastinum was examined.  There was significant precarinal/paratracheal adenopathy on the right, significant adenopathy on the right hilum, and significant adenopathy noted in the subcarinal area.  We proceeded to sample the subcarinal area (station 7) approaching it from the right.  ROSE revealed lymphocytes but no other abnormality at that time.  A  total of 4 passes were performed.  Attempt was made at sampling on the right precarinal area.  Cartilage  rings that make it somewhat challenging and only 1 good core could be obtained.  Placed in preservative for further analysis.  Having completed this, the EBUS scope was retrieved and again replaced for the therapeutic video bronchoscope.  The airway was then examined thoroughly to ensure adequate hemostasis.  After examination and inspection and noting excellent hemostasis, the patient received 9 mL of 1% lidocaine via bronchial lavage prior to retrieving the bronchoscope.  At this point the procedure was terminated and the patient was allowed to emerge from general anesthesia.  Patient was extubated in the procedure room and transported to the PACU in satisfactory condition.  The patient did not have any complaints of chest pain or shortness of breath postprocedure.  Postprocedure examination shows symmetrical lung sounds.  Postprocedure chest x-ray showed no pneumothorax.  Patient tolerated the procedure well.  Unfortunately could not capture images for this particular procedure   SPECIMENS (Sites): (Place X beside choice below)  Specimens Description   No Specimens Obtained     Washings   X Lavage Right main, 10 mL aliquot  X Biopsies X7 right main  X Fine Needle Aspirates X4 subcarinal, x1 precarinal, right  X Brushings X1 right main   Sputum    FINDINGS:  Endobronchial lesion extending from right main just below carina level into right upper lobe.  Lesion is circumferential and friable.  Significant subcarinal adenopathy on ultrasound, significant precarinal adenopathy and right hilar adenopathy on ultrasound   ESTIMATED BLOOD LOSS: Less than 2 mL   COMPLICATIONS/RESOLUTION: none, post procedure chest x-ray showed no pneumothorax:       IMPRESSION:POST-PROCEDURE DX:  Right mainstem and right upper lobe circumferential mass Mediastinal adenopathy Recurrent carcinoma  RECOMMENDATION/PLAN:  Patient to keep oncology appointments Await pathology report   C. Derrill Kay,  MD Advanced Bronchoscopy PCCM [facility]    *This note was dictated using voice recognition software/Dragon.  Despite best efforts to proofread, errors can occur which can change the meaning.  Any change was purely unintentional.

## 2021-03-06 NOTE — Anesthesia Postprocedure Evaluation (Signed)
Anesthesia Post Note  Patient: Philip Wood  Procedure(s) Performed: VIDEO BRONCHOSCOPY WITH ENDOBRONCHIAL ULTRASOUND  Patient location during evaluation: PACU Anesthesia Type: General Level of consciousness: awake and alert Pain management: pain level controlled Vital Signs Assessment: post-procedure vital signs reviewed and stable Respiratory status: spontaneous breathing, nonlabored ventilation, respiratory function stable and patient connected to nasal cannula oxygen Cardiovascular status: blood pressure returned to baseline and stable Postop Assessment: no apparent nausea or vomiting Anesthetic complications: no   No notable events documented.   Last Vitals:  Vitals:   03/06/21 1415 03/06/21 1425  BP: 124/67 140/78  Pulse: 78 80  Resp: 15 20  Temp: (!) 36.2 C (!) 36.1 C  SpO2: 97% 94%    Last Pain:  Vitals:   03/06/21 1425  TempSrc: Temporal  PainSc: 0-No pain                 Margaree Mackintosh

## 2021-03-07 ENCOUNTER — Encounter: Payer: Self-pay | Admitting: Pulmonary Disease

## 2021-03-07 LAB — SURGICAL PATHOLOGY

## 2021-03-07 LAB — CYTOLOGY - NON PAP

## 2021-03-07 NOTE — Addendum Note (Signed)
Addendum  created 03/07/21 9432 by Biagio Borg, CRNA   Intraprocedure Event edited

## 2021-03-07 NOTE — Addendum Note (Signed)
Addendum  created 03/07/21 0656 by Biagio Borg, CRNA   Clinical Note Signed, Intraprocedure Event edited

## 2021-03-12 ENCOUNTER — Telehealth: Payer: Self-pay | Admitting: Nurse Practitioner

## 2021-03-12 NOTE — Telephone Encounter (Signed)
Left message for patient's daughter informing her of Philip Wood's recommendations. Advised daughter to give our office a call back if she has any questions or concerns.

## 2021-03-12 NOTE — Telephone Encounter (Signed)
Copied from [place] [id number]. Topic: General - Other >> Mar 08, 2021  2:36 PM Celene Kras wrote: Reason for CRM: Pts daughter calling and is requesting to have a handicap placard for the pt. She states that the pt has a hard time getting around due to his SOB. Please advise.

## 2021-03-22 ENCOUNTER — Encounter: Payer: Self-pay | Admitting: Oncology

## 2021-03-22 ENCOUNTER — Other Ambulatory Visit: Payer: Self-pay

## 2021-03-22 ENCOUNTER — Ambulatory Visit
Admission: RE | Admit: 2021-03-22 | Discharge: 2021-03-22 | Disposition: A | Discharge status: Home / Self Care | Payer: Medicare Other | Source: Ambulatory Visit | Attending: Radiation Oncology | Admitting: Radiation Oncology

## 2021-03-22 ENCOUNTER — Inpatient Hospital Stay (HOSPITAL_BASED_OUTPATIENT_CLINIC_OR_DEPARTMENT_OTHER): Payer: Medicare Other | Admitting: Oncology

## 2021-03-22 ENCOUNTER — Encounter: Payer: Self-pay | Admitting: Radiation Oncology

## 2021-03-22 VITALS — BP 123/76 | HR 79 | Temp 97.1°F | Wt 168.1 lb

## 2021-03-22 VITALS — BP 123/75 | HR 77 | Temp 96.9°F | Resp 18 | Wt 168.1 lb

## 2021-03-22 DIAGNOSIS — I130 Hypertensive heart and chronic kidney disease with heart failure and stage 1 through stage 4 chronic kidney disease, or unspecified chronic kidney disease: Secondary | ICD-10-CM | POA: Insufficient documentation

## 2021-03-22 DIAGNOSIS — C3401 Malignant neoplasm of right main bronchus: Secondary | ICD-10-CM

## 2021-03-22 DIAGNOSIS — C3411 Malignant neoplasm of upper lobe, right bronchus or lung: Secondary | ICD-10-CM | POA: Insufficient documentation

## 2021-03-22 DIAGNOSIS — E1122 Type 2 diabetes mellitus with diabetic chronic kidney disease: Secondary | ICD-10-CM | POA: Insufficient documentation

## 2021-03-22 DIAGNOSIS — Z5111 Encounter for antineoplastic chemotherapy: Secondary | ICD-10-CM | POA: Insufficient documentation

## 2021-03-22 DIAGNOSIS — K4090 Unilateral inguinal hernia, without obstruction or gangrene, not specified as recurrent: Secondary | ICD-10-CM | POA: Insufficient documentation

## 2021-03-22 DIAGNOSIS — C3490 Malignant neoplasm of unspecified part of unspecified bronchus or lung: Secondary | ICD-10-CM

## 2021-03-22 DIAGNOSIS — I2510 Atherosclerotic heart disease of native coronary artery without angina pectoris: Secondary | ICD-10-CM | POA: Insufficient documentation

## 2021-03-22 DIAGNOSIS — N189 Chronic kidney disease, unspecified: Secondary | ICD-10-CM | POA: Insufficient documentation

## 2021-03-22 DIAGNOSIS — Z9221 Personal history of antineoplastic chemotherapy: Secondary | ICD-10-CM | POA: Diagnosis not present

## 2021-03-22 DIAGNOSIS — Z7189 Other specified counseling: Secondary | ICD-10-CM

## 2021-03-22 DIAGNOSIS — C329 Malignant neoplasm of larynx, unspecified: Secondary | ICD-10-CM | POA: Insufficient documentation

## 2021-03-22 DIAGNOSIS — N289 Disorder of kidney and ureter, unspecified: Secondary | ICD-10-CM | POA: Diagnosis not present

## 2021-03-22 DIAGNOSIS — Z923 Personal history of irradiation: Secondary | ICD-10-CM | POA: Insufficient documentation

## 2021-03-22 DIAGNOSIS — R590 Localized enlarged lymph nodes: Secondary | ICD-10-CM | POA: Insufficient documentation

## 2021-03-22 DIAGNOSIS — F17210 Nicotine dependence, cigarettes, uncomplicated: Secondary | ICD-10-CM | POA: Diagnosis not present

## 2021-03-22 MED ORDER — LIDOCAINE-PRILOCAINE 2.5-2.5 % EX CREA: TOPICAL_CREAM | CUTANEOUS | 3 refills | Status: AC

## 2021-03-22 MED ORDER — PROCHLORPERAZINE MALEATE 10 MG PO TABS: 10.0000 mg | ORAL_TABLET | Freq: Four times a day (QID) | ORAL | 1 refills | Status: AC | PRN

## 2021-03-22 NOTE — Progress Notes (Signed)
Hematology/Oncology Follow up note [phone] [phone]   Patient Care Team: Venita Lick, NP as PCP - General (Nurse Practitioner) Wellington Hampshire, MD as PCP - Cardiology (Cardiology) Beverly Gust, MD (Otolaryngology) Telford Nab, RN as Registered Nurse [name], MD as Radiation Oncologist (Radiation Oncology)  REFERRING PROVIDER: Venita Lick, NP REASON FOR VISIT Follow up for stage III squamous cell carcinoma.  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  He got definitive radiation to the larynx, and the patient has been doing annual checkup with ENT Dr. Tami Ribas.  # 09/02/2017 ENB biopsy of both lung nodule. Pathology of biopsies showed no lesional cells identified. However he is a high risk patient and negative biopsy results do not exclude malignancy.  May 2019 Patient underwent SBRT to the left lung lesion.  # 02/25/2019 CT scan showed right upper lobe nodule interval increase in size.  #03/2019 he finished SBRT to right upper lobe lesion.   01/24/2021, CT with contrast showed progressive right upper lobe malignancy, while separated from the site of regional right upper lobe lesion.  2 masslike areas, including the more central of these which directly invades the mediastinum, with potential direct tracheal invasion.  Progressive right hilar and subcarinal lymphadenopathy.  Left lower lobe at the site of previously noted left lower lobe lesion, there has been progressive septal thickening and nodularity which has slowly increased over numerous prior examinations.  Highly concerning for locally recurrent disease.  Aortic atherosclerosis, in addition to  three-vessel coronary artery disease.  Calcification of aortic valve.  1.7 cm lesion in the posterior aspect of the interpolar region of the right kidney.  Suspicious for small neoplasm.  02/11/2021, PET scan showed hypermetabolic central right upper lobe/perihilar recurrence with adjacent contiguous right paratracheal adenopathy.  No distal or progressive metastasis identified.  Low-level hypermetabolic activity in the right groin associated with it.  Postsurgical changes.  Coronary and aortic atherosclerosis.  INTERVAL HISTORY Philip Wood is a 75 y.o. male who has above history reviewed by me today presents for follow-up visit for management of stage III squamous cell carcinoma 03/06/2021 status post biopsy via bronchoscopy. Pathology showed right mainstem bronchus positive for squamous cell carcinoma. Right mainstem bronchus brushings positive for SCC, lavage was suspicious for malignancy. Subcarinal lymph node negative.  precarinal lymph node negative. Patient presents to discuss results.  Review of Systems  Constitutional:  Negative for appetite change, chills, fatigue, fever and unexpected weight change.  HENT:   Negative for hearing loss and voice change.   Eyes:  Negative for eye problems and icterus.  Respiratory:  Positive for shortness of breath. Negative for chest tightness and cough.   Cardiovascular:  Negative for chest pain and leg swelling.  Gastrointestinal:  Negative for abdominal distention and abdominal pain.  Endocrine: Negative for hot flashes.  Genitourinary:  Negative for difficulty urinating, dysuria and frequency.   Musculoskeletal:  Negative for arthralgias.  Skin:  Negative for itching and rash.  Neurological:  Negative for light-headedness and numbness.  Hematological:  Negative for adenopathy. Does not bruise/bleed easily.  Psychiatric/Behavioral:  Negative for confusion.      MEDICAL HISTORY:  Past Medical History:  Diagnosis Date   Arthritis  Bruit of left carotid artery    Cancer (HCC)    laryngeal   Chronic heart failure (HCC)    Chronic kidney disease    COPD (chronic obstructive pulmonary disease) (HCC)    Coronary artery disease    Diabetes mellitus without complication (HCC)    GERD (gastroesophageal reflux disease)    Headache    History of kidney stones    Hyperlipidemia    Hypertension    Lumbago    Neuropathy    Tobacco abuse disorder     SURGICAL HISTORY: Past Surgical History:  Procedure Laterality Date   ARTERY BIOPSY Right 11/14/2020   Procedure: BIOPSY TEMPORAL ARTERY;  Surgeon: Katha Cabal, MD;  Location: [facility];  Service: Vascular;  Laterality: Right;   CAROTID PTA/STENT INTERVENTION Left 01/30/2021   Procedure: CAROTID PTA/STENT INTERVENTION;  Surgeon: Katha Cabal, MD;  Location: [place] [facility];  Service: Cardiovascular;  Laterality: Left;   CHOLECYSTECTOMY     ELECTROMAGNETIC NAVIGATION BROCHOSCOPY N/A 08/19/2017   Procedure: ELECTROMAGNETIC NAVIGATION BRONCHOSCOPY;  Surgeon: Flora Lipps, MD;  Location: [facility];  Service: Cardiopulmonary;  Laterality: N/A;   ELECTROMAGNETIC NAVIGATION BROCHOSCOPY Left 09/02/2017   Procedure: ELECTROMAGNETIC NAVIGATION BRONCHOSCOPY;  Surgeon: Flora Lipps, MD;  Location: [facility];  Service: Cardiopulmonary;  Laterality: Left;   THROAT SURGERY     VIDEO BRONCHOSCOPY WITH ENDOBRONCHIAL ULTRASOUND N/A 03/06/2021   Procedure: VIDEO BRONCHOSCOPY WITH ENDOBRONCHIAL ULTRASOUND;  Surgeon: Tyler Pita, MD;  Location: [facility];  Service: Cardiopulmonary;  Laterality: N/A;    SOCIAL HISTORY: Social History   Socioeconomic History   Marital status: Widowed    Spouse name: Not on file   Number of children: Not on file   Years of education: Not on file   Highest education level: 10th grade  Occupational History   Not on file  Tobacco Use   Smoking status: Light Smoker    Packs/day: 0.50    Years: 53.00    Pack years: 26.50    Types:  Cigarettes   Smokeless tobacco: Never   Tobacco comments:    Aprrox 10 cigs/day--02/21/2021  Vaping Use   Vaping Use: Never used  Substance and Sexual Activity   Alcohol use: No    Alcohol/week: 0.0 standard drinks   Drug use: No   Sexual activity: Not Currently  Other Topics Concern   Not on file  Social History Narrative   Works part time at [employer] retired   Investment banker, operational of Radio broadcast assistant Strain: Low Risk    Difficulty of Paying Living Expenses: Not hard at all  Food Insecurity: No Food Insecurity   Worried About Charity fundraiser in the Last Year: Never true   Arboriculturist in the Last Year: Never true  Transportation Needs: No Transportation Needs   Lack of Transportation (Medical): No   Lack of Transportation (Non-Medical): No  Physical Activity: Inactive   Days of Exercise per Week: 0 days   Minutes of Exercise per Session: 0 min  Stress: No Stress Concern Present   Feeling of Stress : Not at all  Social Connections: Not on file  Intimate Partner Violence: Not on file    FAMILY HISTORY: Family History  Problem Relation Age of Onset   Heart disease Father    Heart attack Father    Brain cancer Sister    Cervical cancer Sister    Stomach cancer Sister    Stomach cancer Sister    Diabetes Sister  Lupus Sister    Rectal cancer Brother    Lung cancer Brother    Lung cancer Brother    Lung cancer Brother    Lung cancer Brother     ALLERGIES:  has No Known Allergies.  MEDICATIONS:  Current Outpatient Medications  Medication Sig Dispense Refill   acetaminophen (TYLENOL) 650 MG CR tablet Take 1,300 mg by mouth every 8 (eight) hours as needed for pain.     allopurinol (ZYLOPRIM) 100 MG tablet Take 1 tablet (100 mg total) by mouth daily. (Patient taking differently: Take 100 mg by mouth in the morning.) 90 tablet 4   aspirin EC 81 MG tablet Take 81 mg by mouth in the morning. Swallow whole.     atorvastatin (LIPITOR) 80 MG tablet Take 1  tablet (80 mg total) by mouth daily at 6 PM. 90 tablet 4   clopidogrel (PLAVIX) 75 MG tablet Take 1 tablet (75 mg total) by mouth daily. 30 tablet 11   Ensure (ENSURE) Take 237 mLs by mouth daily.     metFORMIN (GLUCOPHAGE) 500 MG tablet Take 1 tablet (500 mg total) by mouth 2 (two) times daily with a meal. 180 tablet 4   metoprolol succinate (TOPROL XL) 25 MG 24 hr tablet Take 1 tablet (25 mg total) by mouth daily. (Patient taking differently: Take 25 mg by mouth in the morning.) 90 tablet 4   mirtazapine (REMERON) 7.5 MG tablet Take 1 tablet (7.5 mg total) by mouth at bedtime. 90 tablet 4   ramipril (ALTACE) 10 MG capsule Take 1 capsule (10 mg total) by mouth daily. (Patient taking differently: Take 10 mg by mouth in the morning.) 90 capsule 4   tiotropium (SPIRIVA HANDIHALER) 18 MCG inhalation capsule PLACE 1 CAPSULE INTO INHALER AND INHALE ITS CONTENTS ONCE DAILY. (Patient taking differently: Place 18 mcg into inhaler and inhale in the morning.) 90 capsule 4   vitamin B-12 (CYANOCOBALAMIN) 1000 MCG tablet Take 1,000 mcg by mouth daily.     No current facility-administered medications for this visit.     PHYSICAL EXAMINATION: ECOG PERFORMANCE STATUS: 1 - Symptomatic but completely ambulatory Vitals:   03/22/21 1023  BP: 123/75  Pulse: 77  Resp: 18  Temp: (!) 96.9 F (36.1 C)   Filed Weights   03/22/21 1023  Weight: 168 lb 1.6 oz (76.2 kg)  Physical Exam Constitutional:      General: He is not in acute distress.    Appearance: He is not diaphoretic.  HENT:     Head: Normocephalic and atraumatic.     Nose: Nose normal.     Mouth/Throat:     Pharynx: No oropharyngeal exudate.  Eyes:     General: No scleral icterus.    Pupils: Pupils are equal, round, and reactive to light.  Cardiovascular:     Rate and Rhythm: Normal rate and regular rhythm.     Heart sounds: No murmur heard. Pulmonary:     Effort: Pulmonary effort is normal. No respiratory distress.     Breath sounds: No  rales.     Comments: Decreased breath sound bilaterally Chest:     Chest wall: No tenderness.  Abdominal:     General: There is no distension.     Palpations: Abdomen is soft.     Tenderness: There is no abdominal tenderness.  Musculoskeletal:        General: Normal range of motion.     Cervical back: Normal range of motion and neck supple.  Skin:  General: Skin is warm and dry.     Findings: No erythema.  Neurological:     Mental Status: He is alert and oriented to person, place, and time.     Cranial Nerves: No cranial nerve deficit.     Motor: No abnormal muscle tone.     Coordination: Coordination normal.  Psychiatric:        Mood and Affect: Affect normal.          LABORATORY DATA:  I have reviewed the data as listed Lab Results  Component Value Date   WBC 6.4 01/31/2021   HGB 10.9 (L) 01/31/2021   HCT 32.5 (L) 01/31/2021   MCV 94.8 01/31/2021   PLT 159 01/31/2021   Recent Labs    05/31/20 0904 07/30/20 0759 10/15/20 1535 10/15/20 1535 11/13/20 1152 11/14/20 0753 11/28/20 0904 01/24/21 1010 01/30/21 0740 01/31/21 0738  NA 141  --  139   < > 138 141 142  --   --  137  K 4.8  --  4.4  --  4.1 4.0 5.0  --   --  4.4  CL 101  --  99  --  105 102 103  --   --  104  CO2 26  --  24  --  26  --  25  --   --  28  GLUCOSE 96  --  101*   < > 110* 115* 103*  --   --  109*  BUN 16  --  15   < > 20 18 13   --  19 17  CREATININE 1.15   < > 1.01  --  0.98 1.10 0.95 1.10 0.93 0.85  CALCIUM 9.0  --  8.9  --  9.1  --  9.5  --   --  8.5*  GFRNONAA 62  --   --   --  >60  --   --   --  >60 >60  GFRAA 72  --   --   --   --   --   --   --   --   --   PROT  --   --  5.9*  --   --   --  6.1  --   --   --   ALBUMIN  --   --  3.9  --   --   --  4.0  --   --   --   AST  --   --  11  --   --   --  6  --   --   --   ALT  --   --  11  --   --   --  10  --   --   --   ALKPHOS  --   --  108  --   --   --  126*  --   --   --   BILITOT  --   --  0.2  --   --   --  0.3  --   --   --     < > = values in this interval not displayed.    RADIOGRAPHIC STUDIES: I have personally reviewed the radiological images as listed and agreed with the findings in the report. CT Chest W Contrast  Addendum Date: 01/26/2021   ADDENDUM REPORT: 01/26/2021 12:24 ADDENDUM: As mentioned in the body of the report there is a 1.7 cm lesion in the posterior  aspect of the interpolar region of the right kidney, suspicious for small neoplasm, either a renal cell carcinoma or metastatic lesion of the kidney. This could be further evaluated with nonemergent abdominal MRI with and without IV gadolinium if clinically appropriate. Electronically Signed   By: Vinnie Langton M.D.   On: 01/26/2021 12:24   Result Date: 01/26/2021 CLINICAL DATA:  75 year old male with history of non-small cell lung cancer treated with radiation therapy presenting with 1 week of hemoptysis and cough. Follow-up study. EXAM: CT CHEST WITH CONTRAST TECHNIQUE: Multidetector CT imaging of the chest was performed during intravenous contrast administration. CONTRAST:  28mL OMNIPAQUE IOHEXOL 350 MG/ML SOLN COMPARISON:  CT of the chest 07/30/2020. FINDINGS: Cardiovascular: Heart size is normal. There is no significant pericardial fluid, thickening or pericardial calcification. There is aortic atherosclerosis, as well as atherosclerosis of the great vessels of the mediastinum and the coronary arteries, including calcified atherosclerotic plaque in the left anterior descending, left circumflex and right coronary arteries. Thickening calcification of the aortic valve. Mild calcification of the mitral annulus. Mediastinum/Nodes: Enlarging subcarinal (axial image 72 of series 2 measuring 1.7 cm in short axis) and right hilar (axial image 71 of series 2) measuring 1.7 cm) lymph nodes, highly concerning for metastatic disease. No left hilar lymphadenopathy. Esophagus is unremarkable in appearance. No axillary lymphadenopathy. Lungs/Pleura: Definitive  enlargement of a mass-like area with convex margins in the right upper lobe (axial image 50 of series 2 and coronal image 85 of series 4) which currently measures 2.0 x 4.3 x 2.5 cm, compatible with progressive right upper lobe neoplasm. In association with this there is a more central mass in the right upper lobe (axial image 59 of series 2 and coronal image 79 of series 4) measuring 5.0 x 4.0 x 2.3 cm which directly invades the mediastinum and is now completely obscuring the fat plane between the lesion and the adjacent distal trachea and proximal right mainstem bronchus. There is a contour abnormality in the region of the distal trachea best appreciated on coronal images 80-82 of series 4, such that early direct invasion into the trachea is not excluded. Slight progression of area of septal thickening and nodularity in the left lower lobe, best appreciated on axial image 115 of series 3 where the bulkiest portion of this region measures approximately 2.1 x 2.1 cm. No acute consolidative airspace disease. No pleural effusions. Diffuse bronchial wall thickening with mild centrilobular and paraseptal emphysema. Upper Abdomen: Status post cholecystectomy. Mild intra and extrahepatic biliary ductal dilatation, similar to prior studies, presumably reflective of benign post cholecystectomy physiology. Aortic atherosclerosis. Severe atrophy of the right kidney with multiple small lesions, most concerning of which is a 1.7 cm intermediate attenuation (43 HU) lesion extending off the posterior aspect of the interpolar region (axial image 179 of series 2), new compared to prior PET-CT. Musculoskeletal: There are no aggressive appearing lytic or blastic lesions noted in the visualized portions of the skeleton. IMPRESSION: 1. Progressive right upper lobe malignancy. Notably, this appears well separated from the site of the regional right upper lobe lesion which was in the anterior aspect of the right upper lobe. On today's  study, there are 2 mass-like areas, including the more central of these which directly invades the mediastinum, with potential direct tracheal invasion, as well as progressive right hilar and subcarinal lymphadenopathy. 2. In addition, in the left lower lobe at the site of the previously noted left lower lobe lesion there has been progressive septal thickening and nodularity which  has slowly increased over numerous prior examinations. Findings are highly concerning for locally recurrent disease. 3. Aortic atherosclerosis, in addition to 3 vessel coronary artery disease. Assessment for potential risk factor modification, dietary therapy or pharmacologic therapy may be warranted, if clinically indicated. 4. There are calcifications of the aortic valve. Echocardiographic correlation for evaluation of potential valvular dysfunction may be warranted if clinically indicated. These results will be called to the ordering clinician or representative by the Radiologist Assistant, and communication documented in the PACS or [employer]. Aortic Atherosclerosis (ICD10-I70.0) and Emphysema (ICD10-J43.9). Electronically Signed: By: Vinnie Langton M.D. On: 01/26/2021 10:07   PERIPHERAL VASCULAR CATHETERIZATION  Result Date: 01/30/2021 See surgical note for result.  NM PET Image Restag (PS) Skull Base To Thigh  Result Date: 02/13/2021 CLINICAL DATA:  Subsequent treatment strategy for non-small cell lung cancer. Previous radiation therapy. Findings suspicious for local recurrence on recent chest CT EXAM: NUCLEAR MEDICINE PET SKULL BASE TO THIGH TECHNIQUE: 9.4 mCi F-18 FDG was injected intravenously. Full-ring PET imaging was performed from the skull base to thigh after the radiotracer. CT data was obtained and used for attenuation correction and anatomic localization. Fasting blood glucose: 111 mg/dl COMPARISON:  CT chest 01/24/2021 and 07/30/2020.  PET-CT 03/10/2019. FINDINGS: Mediastinal blood pool activity: SUV  max 1.5 NECK: No hypermetabolic cervical lymph nodes are identified.There are no lesions of the pharyngeal mucosal space. Incidental CT findings: Left carotid stent noted. CHEST: The recurrent right suprahilar mass demonstrates peripheral hypermetabolic activity which is greatest medially (SUV max 7.6), consistent with local recurrence. This measures approximately 3.4 x 3.2 cm on image 94/3. There is contiguous right paratracheal hypermetabolic adenopathy. The mildly prominent subcarinal node seen on the recent CT demonstrates no hypermetabolic activity. The superior extent of the pulmonary component demonstrates no significant metabolic activity. There is no peripheral hypermetabolic pulmonary activity. Specifically, no hypermetabolic activity is demonstrated associated with the increased nodular septal thickening in the left lower lobe. Incidental CT findings: Atherosclerosis of the aorta, great vessels and coronary arteries. No significant pleural or pericardial effusion. Radiation changes in the central right upper lobe with left lower lobe scarring. No suspicious peripheral pulmonary nodules. ABDOMEN/PELVIS: There is no hypermetabolic activity within the liver, adrenal glands, spleen or pancreas. There is no hypermetabolic nodal activity. There are apparent postsurgical changes in the right inguinal region with associated low level hypermetabolic activity (SUV max 2.7). Incidental CT findings: Severe right renal atrophy with cortical thinning. Diffuse aortic and branch vessel atherosclerosis, prior cholecystectomy, probable left periaortic lymphangioma (2.0 cm on image 173/3), periampullary duodenal diverticulum and moderate enlargement of the prostate gland are again noted. There is a right inguinal hernia containing fat and an associated small right scrotal hydrocele. SKELETON: There is no hypermetabolic activity to suggest osseous metastatic disease. Incidental CT findings: none IMPRESSION: 1. Hypermetabolic  central right upper lobe/perihilar recurrence with adjacent contiguous right paratracheal adenopathy. 2. No distant or progressive metastases identified. No suspicious peripheral lung hypermetabolic activity. 3. Low level hypermetabolic activity in the right groin associated with apparent postsurgical changes, presumably from recent carotid stent placement. 4. Coronary and Aortic Atherosclerosis (ICD10-I70.0). Electronically Signed   By: Richardean Sale M.D.   On: 02/13/2021 09:39   DG Chest Port 1 View  Result Date: 03/06/2021 CLINICAL DATA:  Status post bronchoscopy EXAM: PORTABLE CHEST 1 VIEW COMPARISON:  Chest radiograph from 07/17/2017, CT chest 01/24/2021 FINDINGS: The cardiomediastinal silhouette is normal. Is peculiar did mass is seen in the right suprahilar region with surrounding architectural distortion as  seen on the recent CT chest. There is no evidence of pneumothorax following bronchoscopy. The lungs are otherwise clear. There is no pleural effusion. There is no acute osseous abnormality. IMPRESSION: 1. No evidence of pneumothorax following bronchoscopy. 2. Right upper lobe mass as seen on prior chest CT. Electronically Signed   By: Valetta Mole M.D.   On: 03/06/2021 14:28      ASSESSMENT & PLAN:  1. Squamous cell carcinoma of larynx (HCC)   2. Malignant neoplasm of lung, unspecified laterality, unspecified part of lung ([place])   3. Goals of care, counseling/discussion   4. Kidney lesion    Cancer Staging  Lung cancer [facility]) Staging form: Lung, AJCC 8th Edition - Clinical: Stage IIIA (rcT4, cN1, cM0) - Signed by Earlie Server, MD on 02/13/2021  #Presumed lung cancer, status post SBRT of left lung nodule in 2019 and SBRP of right upper lobe nodule . 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  Pathology report reviewed and discussed with patient.  Even though the precarinal and subcarinal lymph nodes are negative, clinically patient has hypermetabolic perihilar recurrence with  contiguous right paratracheal adenopathy, strongly suspicious for lymph node involvement. The diagnosis and care plan were discussed with patient in detail.  I recommend concurrent chemotherapy and radiation followed by immunotherapy maintenance. Chemotherapy education was provided.  We had discussed the composition of chemotherapy regimen, length of chemo cycle, duration of treatment and the time to assess response to treatment.    I explained to the patient the risks and benefits of chemotherapy including all but not limited to hair loss, mouth sore, nausea, vomiting, diarrhea, low blood counts, bleeding, neuropathy and risk of life threatening infection and even death, secondary malignancy etc.  . Patient voices understanding and willing to proceed chemotherapy.  Communicated with Dr. Baruch Gouty.  He will need 6 weeks of radiation. Obtain MRI brain with and without contrast to complete staging.  # Chemotherapy education; patient prefers Medi- port placement, refer to IR. Antiemetics-Zofran and Compazine; EMLA cream sent to pharmacy  Supportive care measures are necessary for patient well-being and will be provided as necessary. We spent sufficient time to discuss many aspect of care, questions were answered to patient's satisfaction.  # Squamous cell carcinoma of larynx, recommend he continue to have annual check up with ENT Dr.McQueen.  #Right kidney lesion, not well characterized on PET scan.   will obtain MRI renal protocol for further evaluation of the kidney lesion once he finishes treatment for lung cancer..  We spent sufficient time to discuss many aspect of care, questions were answered to patient's satisfaction.  Earlie Server, MD, PhD  03/22/2021

## 2021-03-22 NOTE — Progress Notes (Signed)
Radiation Oncology Follow up Note  Name: Philip Wood   Date:   03/22/2021 MRN:  [id number] DOB: [birth year]    This 75 y.o. 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  With SBRT to his left lung now with biopsy positive squamous cell carcinoma the right lung stage IIIa (T4 N1M0)  REFERRING PROVIDER: Venita Lick, NP  HPI: Patient is a 75 year old male well-known to department.  Recently treated back in 2019 to his left lung with SBRT 60 Gray in 5 fractions.  He is also now out 1 year having completed radiation therapy to his right upper lobe for stage I non-small cell lung cancer..  We have been tracking a recurrent mass in his right hilar region and PET CT scan showed hypermetabolic activity consistent with recurrence adjacent to contiguous right paratracheal adenopathy no distant or progressive metastatic disease identified..  Patient is fairly asymptomatic.  Underwent bronchoscopy with cytology positive for squamous cell carcinoma.  He is now seen today by both medical oncology and radiation oncology for consideration of treatment.  He specifically [place] cough hemoptysis or chest tightness.  COMPLICATIONS OF TREATMENT: none  FOLLOW UP COMPLIANCE: keeps appointments   PHYSICAL EXAM:  BP 123/76   Pulse 79   Temp (!) 97.1 F (36.2 C) (Tympanic)   Wt 168 lb 1.6 oz (76.2 kg)   BMI 24.82 kg/m  Well-developed well-nourished patient in NAD. HEENT reveals PERLA, EOMI, discs not visualized.  Oral cavity is clear. No oral mucosal lesions are identified. Neck is clear without evidence of cervical or supraclavicular adenopathy. Lungs are clear to A&P. Cardiac examination is essentially unremarkable with regular rate and rhythm without murmur rub or thrill. Abdomen is benign with  no organomegaly or masses noted. Motor sensory and DTR levels are equal and symmetric in the upper and lower extremities. Cranial nerves II through XII are grossly intact. Proprioception is intact. No peripheral adenopathy or edema is identified. No motor or sensory levels are noted. Crude visual fields are within normal range.  RADIOLOGY RESULTS: PET CT scans and CT scans reviewed compatible with above-stated findings  PLAN: Stage IIIa squamous cell carcinoma recurrence of the right lung and 75 year old male with multiple prior treatments.  At this time of recommended concurrent chemoradiation therapy.  Based on the 3 a nature of his disease would offer IMRT treatment planning and delivery.  We will plan on delivering 60 Gray over 6 weeks with concurrent chemotherapy.  I would choose IMRT based on ability to try to minimize radiation to previously radiated areas spinal cord heart esophagus and normal lung volume.  There will be extra effort by both professional staff as well as technical staff to coordinate and manage concurrent chemoradiation and ensuing side effects during her treatments.  Patient comprehends my treatment plan well.  I discussed the case personally with medical oncology.  I have personally set up and ordered CT simulation for next week.  I would like to take this opportunity to thank you for allowing me to participate in the care of your patient.[name], MD

## 2021-03-22 NOTE — Progress Notes (Signed)
START ON PATHWAY REGIMEN - Non-Small Cell Lung     Administer weekly:     Paclitaxel      Carboplatin   **Always confirm dose/schedule in your pharmacy ordering system**  Patient Characteristics: Preoperative or Nonsurgical Candidate (Clinical Staging), Stage III - Nonsurgical Candidate (Nonsquamous and Squamous), PS = 0, 1 Therapeutic Status: Preoperative or Nonsurgical Candidate (Clinical Staging) AJCC T Category: cT4 AJCC N Category: cN1 AJCC M Category: cM0 AJCC 8 Stage Grouping: IIIA ECOG Performance Status: 1 Intent of Therapy: Curative Intent, Discussed with Patient 

## 2021-03-22 NOTE — Progress Notes (Signed)
Pt here for follow up. No new concerns voiced.   

## 2021-03-25 ENCOUNTER — Other Ambulatory Visit: Payer: Self-pay

## 2021-03-25 ENCOUNTER — Inpatient Hospital Stay: Payer: Medicare Other

## 2021-03-26 ENCOUNTER — Ambulatory Visit
Admission: RE | Admit: 2021-03-26 | Discharge: 2021-03-26 | Disposition: A | Discharge status: Home / Self Care | Payer: Medicare Other | Source: Ambulatory Visit | Attending: Oncology | Admitting: Oncology

## 2021-03-26 DIAGNOSIS — C3490 Malignant neoplasm of unspecified part of unspecified bronchus or lung: Secondary | ICD-10-CM | POA: Diagnosis not present

## 2021-03-26 DIAGNOSIS — G319 Degenerative disease of nervous system, unspecified: Secondary | ICD-10-CM | POA: Diagnosis not present

## 2021-03-26 DIAGNOSIS — C329 Malignant neoplasm of larynx, unspecified: Secondary | ICD-10-CM | POA: Insufficient documentation

## 2021-03-26 IMAGING — MR MR HEAD WO/W CM
16 series · 48 of 48 positions shown · IV contrast (gadavist)
Comparison: [DATE]

CLINICAL DATA: Squamous cell carcinoma of the right lung.  Staging.

EXAM:
MRI HEAD WITHOUT AND WITH CONTRAST
TECHNIQUE: Multiplanar, multiecho pulse sequences of the brain and surrounding
structures were obtained without and with intravenous contrast.
CONTRAST:  7.5mL GADAVIST GADOBUTROL 1 MMOL/ML IV SOLN

[Series 5: ax dwi_tracew · axial · 3.0mm · 0.65mm/px · z∈[-64,+91]mm · 3 of 48 slices shown]
[im 1/48]
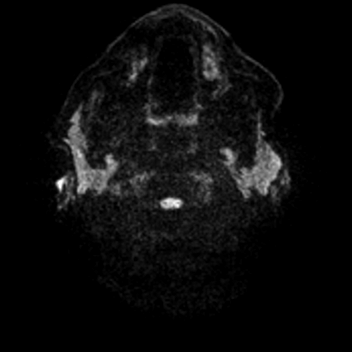
[im 24/48]
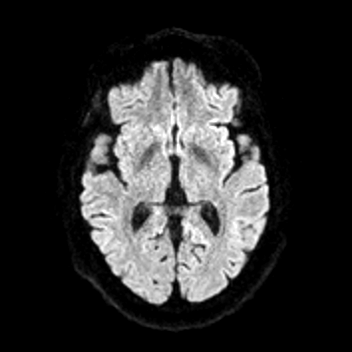
[im 48/48]
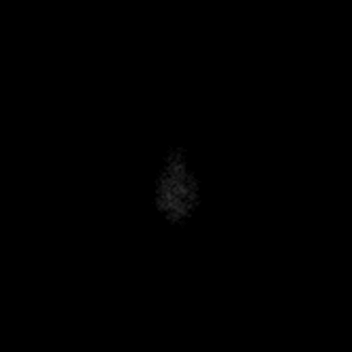

[Series 6: ax dwi_adc · axial · 3.0mm · 0.65mm/px · z∈[-64,+85]mm · 3 of 46 slices shown]
[im 1/46]
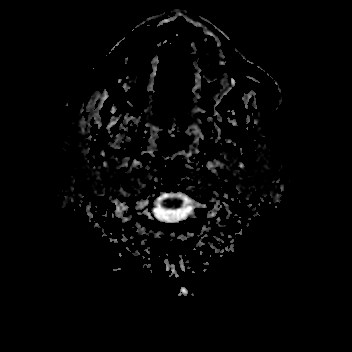
[im 23/46]
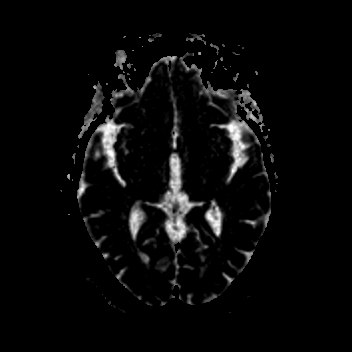
[im 46/46]
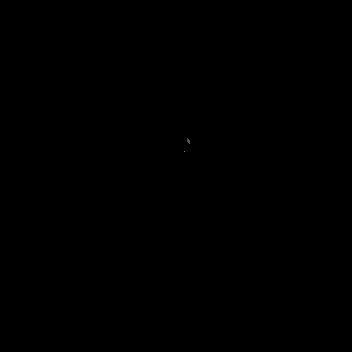

[Series 7: cor dwi_tracew · coronal · 5.0mm · 0.60mm/px · 2 of 38 slices shown]
[im 1/38]
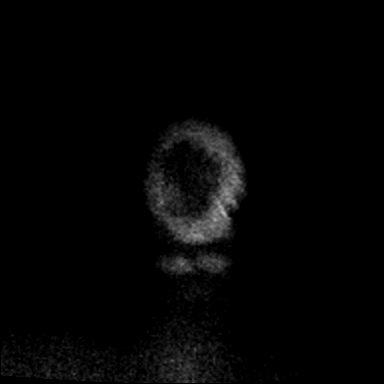
[im 38/38]
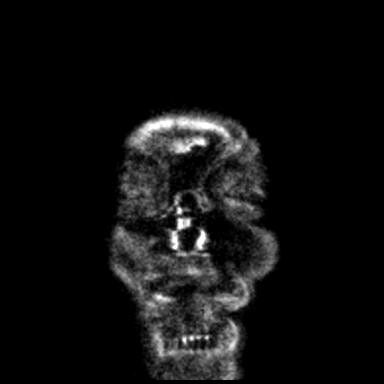

[Series 8: cor dwi_adc · coronal · 5.0mm · 0.60mm/px · 2 of 38 slices shown]
[im 1/38]
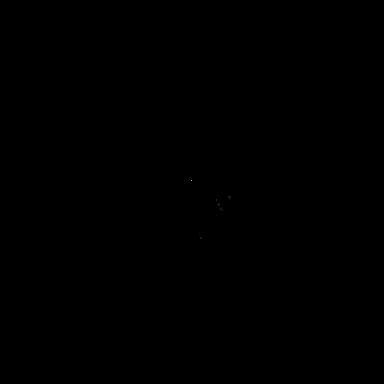
[im 38/38]
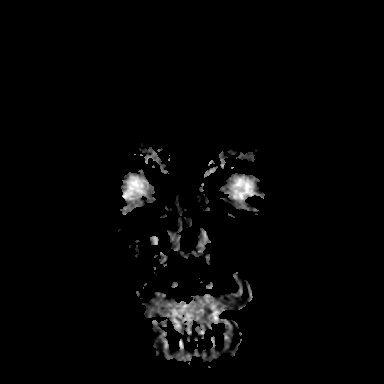

[Series 9: T1 · sagittal · 5.0mm · 0.62mm/px · 1 of 25 slices shown (1 of 2)]
[im 1/25]
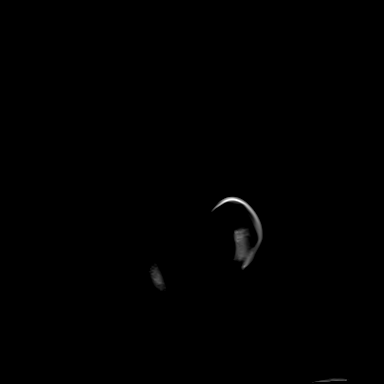

[Series 10: T2 · axial · 5.0mm · 0.53mm/px · 1 of 26 slices shown]
[im 1/26]
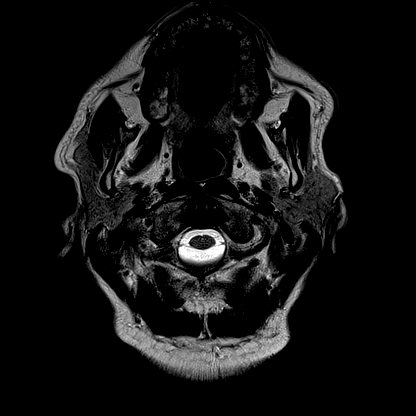

[Series 11: mag_images · axial · 3.0mm · 0.90mm/px · z∈[-77,+100]mm · 3 of 60 slices shown]
[im 1/60]
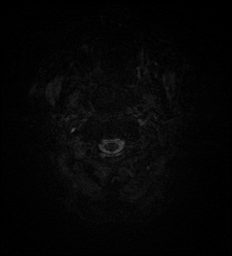
[im 30/60]
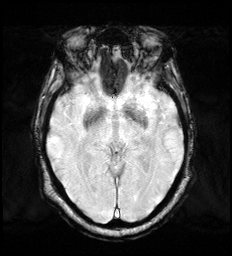
[im 60/60]
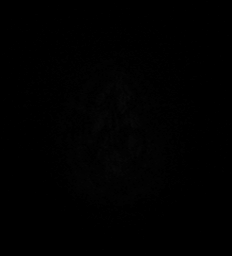

[Series 12: pha_images · axial · 3.0mm · 0.90mm/px · z∈[-77,+100]mm · 3 of 60 slices shown]
[im 1/60]
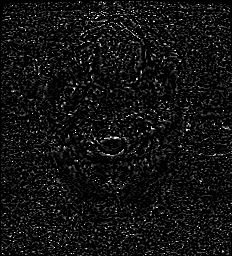
[im 30/60]
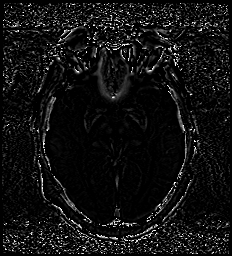
[im 60/60]
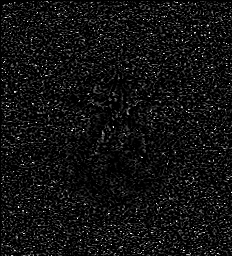

[Series 13: swi_images · axial · 3.0mm · 0.90mm/px · z∈[-77,+100]mm · 3 of 60 slices shown]
[im 1/60]
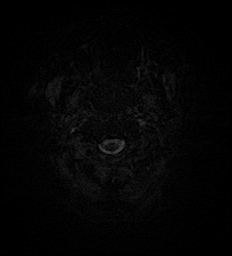
[im 30/60]
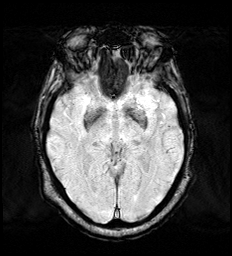
[im 60/60]
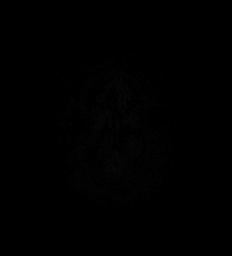

[Series 14: mip_images(sw) · axial · 24.0mm · 0.90mm/px · z∈[-67,+89]mm · 3 of 53 slices shown]
[im 1/53]
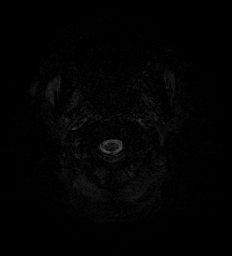
[im 27/53]
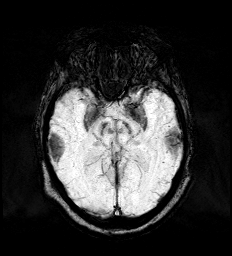
[im 53/53]
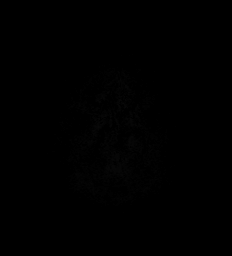

[Series 15: FLAIR · axial · 3.0mm · 0.53mm/px · z∈[-70,+92]mm · 3 of 55 slices shown]
[im 1/55]
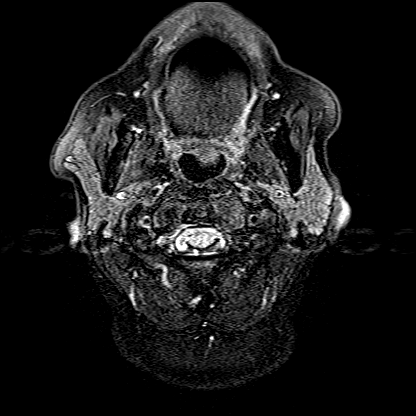
[im 28/55]
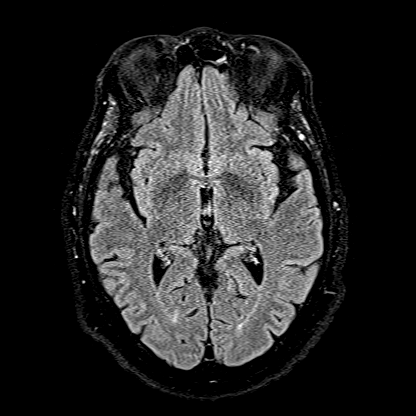
[im 55/55]
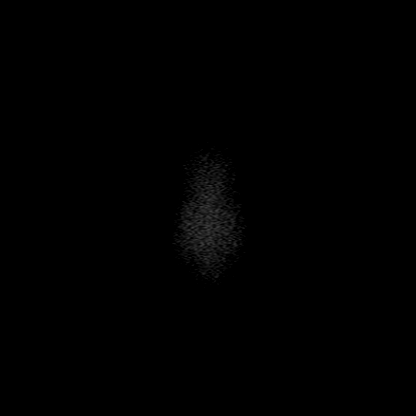

[Series 16: T1 · axial · 1.0mm · 0.98mm/px · z∈[-74,+101]mm · 9 of 176 slices shown (2 of 2)]
[im 1/176]
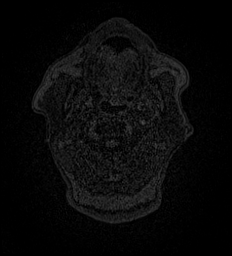
[im 22/176]
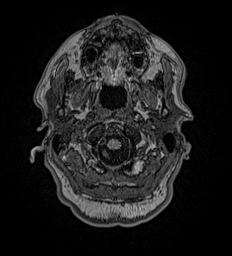
[im 44/176]
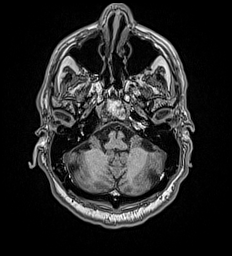
[im 66/176]
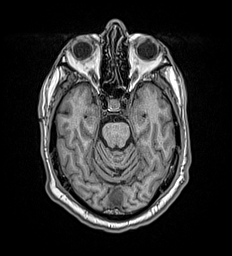
[im 88/176]
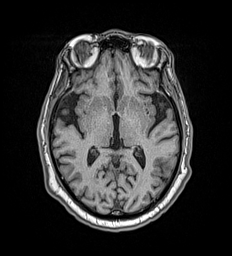
[im 110/176]
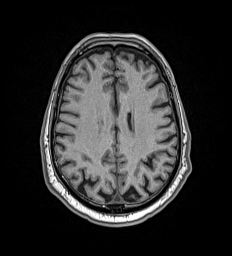
[im 132/176]
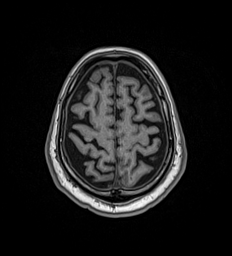
[im 154/176]
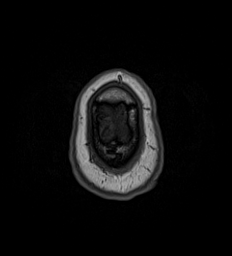
[im 176/176]
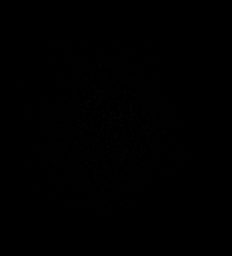

[Series 17: T2 post-contrast · coronal · 5.0mm · 0.57mm/px · 1 of 29 slices shown]
[im 1/29]
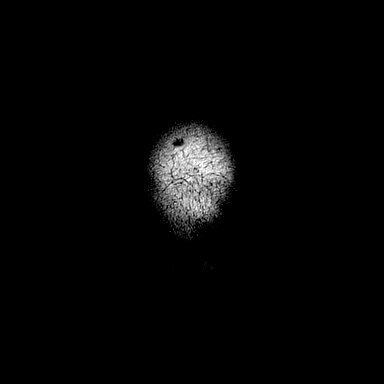

[Series 18: T1 post-contrast · axial · 1.0mm · 0.98mm/px · z∈[-74,+101]mm · 9 of 176 slices shown (1 of 3)]
[im 1/176]
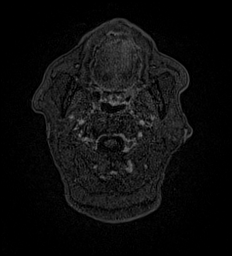
[im 22/176]
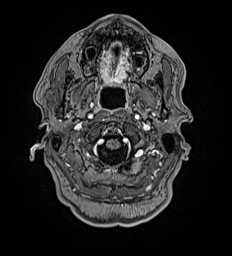
[im 44/176]
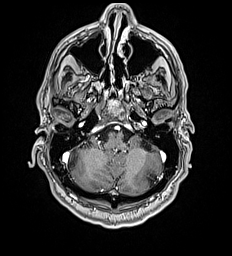
[im 66/176]
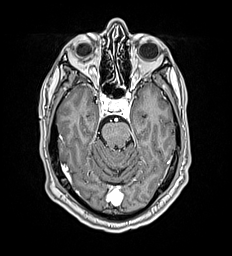
[im 88/176]
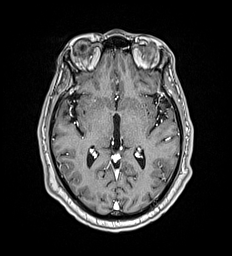
[im 110/176]
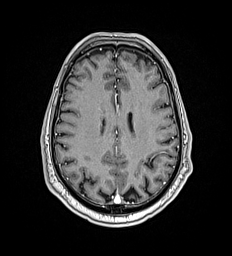
[im 132/176]
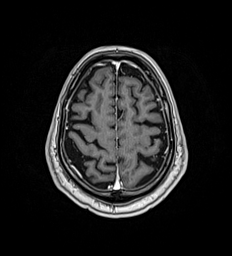
[im 154/176]
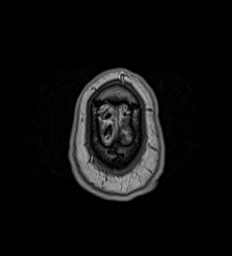
[im 176/176]
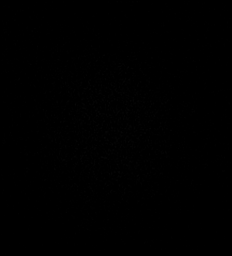

[Series 19: T1 post-contrast · coronal · 5.0mm · 0.57mm/px · 1 of 29 slices shown (2 of 3)]
[im 1/29]
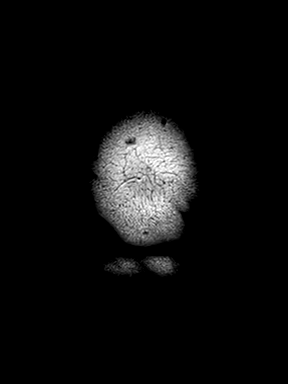

[Series 20: T1 post-contrast · sagittal · 5.0mm · 0.62mm/px · 1 of 25 slices shown (3 of 3)]
[im 1/25]
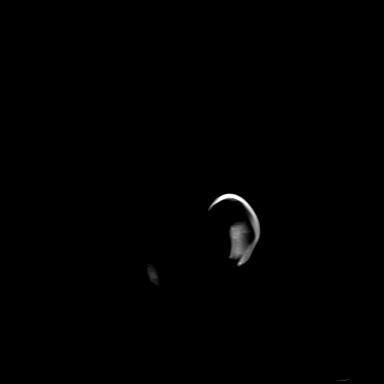

[48 of 48 positions shown; findings below may reference images not displayed]

FINDINGS: Brain: Punctate focus of restricted diffusion at the right parietal
vertex, diffusion imaging 40 and FLAIR image 46. This was not
present in [REDACTED], and is most consistent with a punctate cortical
infarction. No abnormal enhancement occurs in this location.
Elsewhere, the brain shows generalized atrophy with a few old small
vessel insults of the deep white matter. No sign of mass lesion,
hemorrhage, hydrocephalus or extra-axial collection.

Vascular: Major vessels at the base of the brain show flow.

Skull and upper cervical spine: Negative

Sinuses/Orbits: Clear/normal

Other: None
IMPRESSION: Age related atrophy. Minimal chronic small-vessel ischemic change of
the white matter. No evidence of metastatic disease. Punctate focus
of restricted diffusion and T2/FLAIR signal at the right parietal
vertex as described above, most consistent with an incidental tiny
cortical infarction. Tiny nonenhancing metastasis is not excluded
but would be less likely.

## 2021-03-26 MED ORDER — GADOBUTROL 1 MMOL/ML IV SOLN
7.5000 mL | Freq: Once | INTRAVENOUS | Status: AC | PRN
  Administered 2021-03-26: 7.5 mL via INTRAVENOUS

## 2021-03-27 ENCOUNTER — Ambulatory Visit
Admission: RE | Admit: 2021-03-27 | Discharge: 2021-03-27 | Disposition: A | Discharge status: Home / Self Care | Payer: Medicare Other | Source: Ambulatory Visit | Attending: Radiation Oncology | Admitting: Radiation Oncology

## 2021-03-27 DIAGNOSIS — E1122 Type 2 diabetes mellitus with diabetic chronic kidney disease: Secondary | ICD-10-CM | POA: Insufficient documentation

## 2021-03-27 DIAGNOSIS — I2510 Atherosclerotic heart disease of native coronary artery without angina pectoris: Secondary | ICD-10-CM | POA: Diagnosis not present

## 2021-03-27 DIAGNOSIS — Z510 Encounter for antineoplastic radiation therapy: Secondary | ICD-10-CM | POA: Diagnosis not present

## 2021-03-27 DIAGNOSIS — N189 Chronic kidney disease, unspecified: Secondary | ICD-10-CM | POA: Diagnosis not present

## 2021-03-27 DIAGNOSIS — C329 Malignant neoplasm of larynx, unspecified: Secondary | ICD-10-CM | POA: Insufficient documentation

## 2021-03-27 DIAGNOSIS — Z5111 Encounter for antineoplastic chemotherapy: Secondary | ICD-10-CM | POA: Insufficient documentation

## 2021-03-27 DIAGNOSIS — K4090 Unilateral inguinal hernia, without obstruction or gangrene, not specified as recurrent: Secondary | ICD-10-CM | POA: Diagnosis not present

## 2021-03-27 DIAGNOSIS — I130 Hypertensive heart and chronic kidney disease with heart failure and stage 1 through stage 4 chronic kidney disease, or unspecified chronic kidney disease: Secondary | ICD-10-CM | POA: Diagnosis not present

## 2021-03-27 DIAGNOSIS — R590 Localized enlarged lymph nodes: Secondary | ICD-10-CM | POA: Insufficient documentation

## 2021-03-27 DIAGNOSIS — C3411 Malignant neoplasm of upper lobe, right bronchus or lung: Secondary | ICD-10-CM | POA: Insufficient documentation

## 2021-03-27 DIAGNOSIS — F17210 Nicotine dependence, cigarettes, uncomplicated: Secondary | ICD-10-CM | POA: Diagnosis not present

## 2021-03-28 ENCOUNTER — Other Ambulatory Visit: Payer: Self-pay | Admitting: Radiology

## 2021-03-29 ENCOUNTER — Ambulatory Visit
Admission: RE | Admit: 2021-03-29 | Discharge: 2021-03-29 | Disposition: A | Discharge status: Home / Self Care | Payer: Medicare Other | Source: Ambulatory Visit | Attending: Oncology | Admitting: Oncology

## 2021-03-29 ENCOUNTER — Encounter: Payer: Self-pay | Admitting: Radiology

## 2021-03-29 ENCOUNTER — Other Ambulatory Visit: Payer: Self-pay

## 2021-03-29 DIAGNOSIS — N189 Chronic kidney disease, unspecified: Secondary | ICD-10-CM | POA: Diagnosis not present

## 2021-03-29 DIAGNOSIS — I130 Hypertensive heart and chronic kidney disease with heart failure and stage 1 through stage 4 chronic kidney disease, or unspecified chronic kidney disease: Secondary | ICD-10-CM | POA: Diagnosis not present

## 2021-03-29 DIAGNOSIS — C3431 Malignant neoplasm of lower lobe, right bronchus or lung: Secondary | ICD-10-CM | POA: Diagnosis not present

## 2021-03-29 DIAGNOSIS — C329 Malignant neoplasm of larynx, unspecified: Secondary | ICD-10-CM | POA: Diagnosis not present

## 2021-03-29 DIAGNOSIS — F17210 Nicotine dependence, cigarettes, uncomplicated: Secondary | ICD-10-CM | POA: Insufficient documentation

## 2021-03-29 DIAGNOSIS — C3490 Malignant neoplasm of unspecified part of unspecified bronchus or lung: Secondary | ICD-10-CM | POA: Diagnosis not present

## 2021-03-29 DIAGNOSIS — E1122 Type 2 diabetes mellitus with diabetic chronic kidney disease: Secondary | ICD-10-CM | POA: Diagnosis not present

## 2021-03-29 DIAGNOSIS — Z452 Encounter for adjustment and management of vascular access device: Secondary | ICD-10-CM | POA: Diagnosis not present

## 2021-03-29 DIAGNOSIS — I509 Heart failure, unspecified: Secondary | ICD-10-CM | POA: Diagnosis not present

## 2021-03-29 DIAGNOSIS — J449 Chronic obstructive pulmonary disease, unspecified: Secondary | ICD-10-CM | POA: Insufficient documentation

## 2021-03-29 DIAGNOSIS — C3491 Malignant neoplasm of unspecified part of right bronchus or lung: Secondary | ICD-10-CM | POA: Diagnosis not present

## 2021-03-29 DIAGNOSIS — E785 Hyperlipidemia, unspecified: Secondary | ICD-10-CM | POA: Diagnosis not present

## 2021-03-29 DIAGNOSIS — K219 Gastro-esophageal reflux disease without esophagitis: Secondary | ICD-10-CM | POA: Diagnosis not present

## 2021-03-29 DIAGNOSIS — I2510 Atherosclerotic heart disease of native coronary artery without angina pectoris: Secondary | ICD-10-CM | POA: Insufficient documentation

## 2021-03-29 HISTORY — PX: IR IMAGING GUIDED PORT INSERTION: IMG5740

## 2021-03-29 LAB — GLUCOSE, CAPILLARY: Glucose-Capillary: 102 mg/dL — ABNORMAL HIGH (ref 70–99)

## 2021-03-29 MED ORDER — HEPARIN SOD (PORK) LOCK FLUSH 100 UNIT/ML IV SOLN
INTRAVENOUS | Status: AC
  Administered 2021-03-29: 500 [IU]
  Filled 2021-03-29: qty 5

## 2021-03-29 MED ORDER — MIDAZOLAM HCL 2 MG/2ML IJ SOLN
INTRAMUSCULAR | Status: AC | PRN
  Administered 2021-03-29: 1 mg via INTRAVENOUS

## 2021-03-29 MED ORDER — LIDOCAINE-EPINEPHRINE 1 %-1:100000 IJ SOLN
INTRAMUSCULAR | Status: AC
  Administered 2021-03-29: 19 mL
  Filled 2021-03-29: qty 1

## 2021-03-29 MED ORDER — FENTANYL CITRATE (PF) 100 MCG/2ML IJ SOLN
INTRAMUSCULAR | Status: AC
  Filled 2021-03-29: qty 2

## 2021-03-29 MED ORDER — FENTANYL CITRATE (PF) 100 MCG/2ML IJ SOLN
INTRAMUSCULAR | Status: AC | PRN
  Administered 2021-03-29: 50 ug via INTRAVENOUS

## 2021-03-29 MED ORDER — SODIUM CHLORIDE 0.9 % IV SOLN
INTRAVENOUS | Status: DC
  Administered 2021-03-29: 20 mL/h via INTRAVENOUS
  Filled 2021-03-29: qty 1000

## 2021-03-29 MED ORDER — MIDAZOLAM HCL 2 MG/2ML IJ SOLN
INTRAMUSCULAR | Status: AC
  Filled 2021-03-29: qty 2

## 2021-03-29 NOTE — Procedures (Signed)
Vascular and Interventional Radiology Procedure Note  Patient: Philip Wood DOB: November 09, 1945 Medical Record Number: [id number] Note Date/Time: 03/29/21 11:21 AM   Performing Physician: Michaelle Birks, MD Assistant(s): None  Diagnosis: Lung cancer  Procedure: PORT PLACEMENT  Anesthesia: Conscious Sedation Complications: None Estimated Blood Loss: Minimal  Findings:  Successful left-sided port placement, with the tip of the catheter in the proximal right atrium.  Plan: Catheter ready for use.  See detailed procedure note with images in PACS. The patient tolerated the procedure well without incident or complication and was returned to Recovery in stable condition.    Michaelle Birks, MD [facility] Radiology   Pager. [place]

## 2021-03-29 NOTE — Consult Note (Addendum)
Chief Complaint: Patient was seen in consultation today for Port-A-Cath placement  Referring Physician(s): Yu,Zhou  Supervising Physician: Michaelle Birks  Patient Status: [facility] - Out-pt  History of Present Illness: Philip Wood is a 75 y.o. 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  He presents today for Port-A-Cath placement to assist with treatment.  Past Medical History:  Diagnosis Date   Arthritis    Bruit of left carotid artery    Cancer ([place])    laryngeal   Chronic heart failure (HCC)    Chronic kidney disease    COPD (chronic obstructive pulmonary disease) (HCC)    Coronary artery disease    Diabetes mellitus without complication (HCC)    GERD (gastroesophageal reflux disease)    Headache    History of kidney stones    Hyperlipidemia    Hypertension    Lumbago    Neuropathy    Tobacco abuse disorder     Past Surgical History:  Procedure Laterality Date   ARTERY BIOPSY Right 11/14/2020   Procedure: BIOPSY TEMPORAL ARTERY;  Surgeon: Katha Cabal, MD;  Location: [facility] ORS;  Service: Vascular;  Laterality: Right;   CAROTID PTA/STENT INTERVENTION Left 01/30/2021   Procedure: CAROTID PTA/STENT INTERVENTION;  Surgeon: Katha Cabal, MD;  Location: [facility];  Service: Cardiovascular;  Laterality: Left;   CHOLECYSTECTOMY     ELECTROMAGNETIC NAVIGATION BROCHOSCOPY N/A 08/19/2017   Procedure: ELECTROMAGNETIC NAVIGATION BRONCHOSCOPY;  Surgeon: Flora Lipps, MD;  Location: [facility] ORS;  Service: Cardiopulmonary;  Laterality: N/A;   ELECTROMAGNETIC NAVIGATION BROCHOSCOPY Left 09/02/2017   Procedure: ELECTROMAGNETIC NAVIGATION BRONCHOSCOPY;  Surgeon: Flora Lipps, MD;  Location: [facility] ORS;  Service:  Cardiopulmonary;  Laterality: Left;   THROAT SURGERY     VIDEO BRONCHOSCOPY WITH ENDOBRONCHIAL ULTRASOUND N/A 03/06/2021   Procedure: VIDEO BRONCHOSCOPY WITH ENDOBRONCHIAL ULTRASOUND;  Surgeon: Tyler Pita, MD;  Location: [facility] ORS;  Service: Cardiopulmonary;  Laterality: N/A;    Allergies: Patient has no known allergies.  Medications: Prior to Admission medications   Medication Sig Start Date End Date Taking? Authorizing Provider  acetaminophen (TYLENOL) 650 MG CR tablet Take 1,300 mg by mouth every 8 (eight) hours as needed for pain.    [provider]  allopurinol (ZYLOPRIM) 100 MG tablet Take 1 tablet (100 mg total) by mouth daily. Patient taking differently: Take 100 mg by mouth in the morning. 05/31/20   Marnee Guarneri T, NP  aspirin EC 81 MG tablet Take 81 mg by mouth in the morning. Swallow whole.    [provider]  atorvastatin (LIPITOR) 80 MG tablet Take 1 tablet (80 mg total) by mouth daily at 6 PM. 05/31/20   Cannady, Henrine Screws T, NP  clopidogrel (PLAVIX) 75 MG tablet Take 1 tablet (75 mg total) by mouth daily. 12/07/20   Kris Hartmann, NP  Ensure (ENSURE) Take 237 mLs by mouth daily.    [provider]  lidocaine-prilocaine (EMLA) cream Apply to affected area once 03/22/21   Earlie Server, MD  metFORMIN (GLUCOPHAGE) 500 MG tablet Take 1 tablet (500 mg total) by mouth 2 (two) times daily with a meal. 01/08/21   Cannady, Henrine Screws T, NP  metoprolol succinate (TOPROL XL) 25 MG 24 hr tablet Take 1 tablet (25 mg total) by mouth daily. Patient taking differently: Take 25 mg by  mouth in the morning. 05/31/20   Cannady, Henrine Screws T, NP  mirtazapine (REMERON) 7.5 MG tablet Take 1 tablet (7.5 mg total) by mouth at bedtime. 01/08/21   Cannady, Henrine Screws T, NP  prochlorperazine (COMPAZINE) 10 MG tablet Take 1 tablet (10 mg total) by mouth every 6 (six) hours as needed (Nausea or vomiting). 03/22/21   Earlie Server, MD  ramipril (ALTACE) 10 MG capsule Take 1 capsule (10 mg total) by  mouth daily. Patient taking differently: Take 10 mg by mouth in the morning. 05/31/20   Cannady, Jolene T, NP  tiotropium (SPIRIVA HANDIHALER) 18 MCG inhalation capsule PLACE 1 CAPSULE INTO INHALER AND INHALE ITS CONTENTS ONCE DAILY. Patient taking differently: Place 18 mcg into inhaler and inhale in the morning. 05/31/20   Cannady, Henrine Screws T, NP  vitamin B-12 (CYANOCOBALAMIN) 1000 MCG tablet Take 1,000 mcg by mouth daily.    [provider]     Family History  Problem Relation Age of Onset   Heart disease Father    Heart attack Father    Brain cancer Sister    Cervical cancer Sister    Stomach cancer Sister    Stomach cancer Sister    Diabetes Sister    Lupus Sister    Rectal cancer Brother    Lung cancer Brother    Lung cancer Brother    Lung cancer Brother    Lung cancer Brother     Social History   Socioeconomic History   Marital status: Widowed    Spouse name: Not on file   Number of children: Not on file   Years of education: Not on file   Highest education level: 10th grade  Occupational History   Not on file  Tobacco Use   Smoking status: Light Smoker    Packs/day: 0.50    Years: 53.00    Pack years: 26.50    Types: Cigarettes   Smokeless tobacco: Never   Tobacco comments:    Aprrox 10 cigs/day--02/21/2021  Vaping Use   Vaping Use: Never used  Substance and Sexual Activity   Alcohol use: No    Alcohol/week: 0.0 standard drinks   Drug use: No   Sexual activity: Not Currently  Other Topics Concern   Not on file  Social History Narrative   Works part time at [employer] retired   Investment banker, operational of Radio broadcast assistant Strain: Low Risk    Difficulty of Paying Living Expenses: Not hard at all  Food Insecurity: No Food Insecurity   Worried About Charity fundraiser in the Last Year: Never true   Arboriculturist in the Last Year: Never true  Transportation Needs: No Transportation Needs   Lack of Transportation (Medical): No   Lack of  Transportation (Non-Medical): No  Physical Activity: Inactive   Days of Exercise per Week: 0 days   Minutes of Exercise per Session: 0 min  Stress: No Stress Concern Present   Feeling of Stress : Not at all  Social Connections: Not on file      Review of Systems denies fever, headache, chest pain, abdominal/back pain, nausea, vomiting or bleeding.  He does have some dyspnea with exertion and occasional cough.  Vital Signs: Vitals:   03/29/21 0939  BP: (!) 146/74  Pulse: 81  Resp: 20  Temp: 97.6 F (36.4 C)  SpO2: 99%      Physical Exam awake, alert.  Chest clear to auscultation bilaterally.  Heart with regular rate , occ ectopy.  Abdomen soft, positive bowel sounds, nontender.  No lower extremity edema.  Imaging: MR Brain W Wo Contrast  Result Date: 03/26/2021 CLINICAL DATA:  Squamous cell carcinoma of the right lung.  Staging. EXAM: MRI HEAD WITHOUT AND WITH CONTRAST TECHNIQUE: Multiplanar, multiecho pulse sequences of the brain and surrounding structures were obtained without and with intravenous contrast. CONTRAST:  7.30mL GADAVIST GADOBUTROL 1 MMOL/ML IV SOLN COMPARISON:  10/16/2020 FINDINGS: Brain: Punctate focus of restricted diffusion at the right parietal vertex, diffusion imaging 40 and FLAIR image 46. This was not present in June, and is most consistent with a punctate cortical infarction. No abnormal enhancement occurs in this location. Elsewhere, the brain shows generalized atrophy with a few old small vessel insults of the deep white matter. No sign of mass lesion, hemorrhage, hydrocephalus or extra-axial collection. Vascular: Major vessels at the base of the brain show flow. Skull and upper cervical spine: Negative Sinuses/Orbits: Clear/normal Other: None IMPRESSION: Age related atrophy. Minimal chronic small-vessel ischemic change of the white matter. No evidence of metastatic disease. Punctate focus of restricted diffusion and T2/FLAIR signal at the right parietal  vertex as described above, most consistent with an incidental tiny cortical infarction. Tiny nonenhancing metastasis is not excluded but would be less likely. Electronically Signed   By: Nelson Chimes M.D.   On: 03/26/2021 10:42   DG Chest Port 1 View  Result Date: 03/06/2021 CLINICAL DATA:  Status post bronchoscopy EXAM: PORTABLE CHEST 1 VIEW COMPARISON:  Chest radiograph from 07/17/2017, CT chest 01/24/2021 FINDINGS: The cardiomediastinal silhouette is normal. Is peculiar did mass is seen in the right suprahilar region with surrounding architectural distortion as seen on the recent CT chest. There is no evidence of pneumothorax following bronchoscopy. The lungs are otherwise clear. There is no pleural effusion. There is no acute osseous abnormality. IMPRESSION: 1. No evidence of pneumothorax following bronchoscopy. 2. Right upper lobe mass as seen on prior chest CT. Electronically Signed   By: Valetta Mole M.D.   On: 03/06/2021 14:28    Labs:  CBC: Recent Labs    09/20/20 0918 10/15/20 1535 11/13/20 1152 11/14/20 0753 01/31/21 0738  WBC 6.9 6.7 5.4  --  6.4  HGB 14.8 14.9 14.1 14.6 10.9*  HCT 42.7 43.8 40.3 43.0 32.5*  PLT 157 168 177  --  159    COAGS: No results for input(s): INR, APTT in the last 8760 hours.  BMP: Recent Labs    05/31/20 0904 07/30/20 0759 10/15/20 1535 10/15/20 1535 11/13/20 1152 11/14/20 0753 11/28/20 0904 01/24/21 1010 01/30/21 0740 01/31/21 0738  NA 141  --  139  --  138 141 142  --   --  137  K 4.8  --  4.4  --  4.1 4.0 5.0  --   --  4.4  CL 101  --  99  --  105 102 103  --   --  104  CO2 26  --  24  --  26  --  25  --   --  28  GLUCOSE 96  --  101*  --  110* 115* 103*  --   --  109*  BUN 16  --  15   < > 20 18 13   --  19 17  CALCIUM 9.0  --  8.9  --  9.1  --  9.5  --   --  8.5*  CREATININE 1.15   < > 1.01  --  0.98 1.10 0.95 1.10 0.93 0.85  GFRNONAA 62  --   --   --  >60  --   --   --  >60 >60  GFRAA 72  --   --   --   --   --   --   --   --    --    < > = values in this interval not displayed.    LIVER FUNCTION TESTS: Recent Labs    10/15/20 1535 11/28/20 0904  BILITOT 0.2 0.3  AST 11 6  ALT 11 10  ALKPHOS 108 126*  PROT 5.9* 6.1  ALBUMIN 3.9 4.0    TUMOR MARKERS: No results for input(s): AFPTM, CEA, CA199, CHROMGRNA in the last 8760 hours.  Assessment and Plan: 75 y.o. 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  He presents today for Port-A-Cath placement to assist with treatment.Risks and benefits of image guided port-a-catheter placement was discussed with the patient including, but not limited to bleeding, infection, pneumothorax, or fibrin sheath development and need for additional procedures.  All of the patient's questions were answered, patient is agreeable to proceed. Consent signed and in chart.    Thank you for this interesting consult.  I greatly enjoyed meeting Jonerik D Gambrell and look forward to participating in their care.  A copy of this report was sent to the requesting provider on this date.  Electronically Signed: D. Rowe Robert, PA-C 03/29/2021, 8:32 AM   I spent a total of  25 minutes   in face to face in clinical consultation, greater than 50% of which was counseling/coordinating care for Port-A-Cath placement

## 2021-04-01 ENCOUNTER — Other Ambulatory Visit: Payer: Self-pay

## 2021-04-01 ENCOUNTER — Ambulatory Visit: Payer: Medicare Other | Admitting: Pulmonary Disease

## 2021-04-01 ENCOUNTER — Encounter: Payer: Self-pay | Admitting: Pulmonary Disease

## 2021-04-01 VITALS — BP 140/84 | HR 87 | Temp 97.0°F | Ht 69.0 in | Wt 169.2 lb

## 2021-04-01 DIAGNOSIS — C3491 Malignant neoplasm of unspecified part of right bronchus or lung: Secondary | ICD-10-CM | POA: Diagnosis not present

## 2021-04-01 DIAGNOSIS — J432 Centrilobular emphysema: Secondary | ICD-10-CM | POA: Diagnosis not present

## 2021-04-01 MED ORDER — ALBUTEROL SULFATE HFA 108 (90 BASE) MCG/ACT IN AERS: 2.0000 | INHALATION_SPRAY | Freq: Four times a day (QID) | RESPIRATORY_TRACT | 2 refills | Status: AC | PRN

## 2021-04-01 NOTE — Patient Instructions (Addendum)
Continue your efforts to quit smoking.  Continue Spiriva I have also added a emergency inhaler in case you need it this is good to have with you at all times in case you have difficulties breathing.  Continue your chemo/radiation.  We will see you  in follow-up in 6 months time call sooner should any new problems arise.

## 2021-04-01 NOTE — Progress Notes (Signed)
Pharmacist Chemotherapy Monitoring - Initial Assessment    Anticipated start date: 04/08/21   The following has been reviewed per standard work regarding the patient's treatment regimen: The patient's diagnosis, treatment plan and drug doses, and organ/hematologic function Lab orders and baseline tests specific to treatment regimen  The treatment plan start date, drug sequencing, and pre-medications Prior authorization status  Patient's documented medication list, including drug-drug interaction screen and prescriptions for anti-emetics and supportive care specific to the treatment regimen The drug concentrations, fluid compatibility, administration routes, and timing of the medications to be used The patient's access for treatment and lifetime cumulative dose history, if applicable  The patient's medication allergies and previous infusion related reactions, if applicable   Changes made to treatment plan:  N/A  Follow up needed:  dose adjustment of carbo due to no dose entered and signing treatment plan   Philip Wood, [place], 04/01/2021  8:57 AM

## 2021-04-01 NOTE — Progress Notes (Signed)
Subjective:    Patient ID: Philip Wood, male    DOB: 1945/05/08, 75 y.o.   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  Bronchoscopy was performed 06 March 2021, with negative available lymph node sampling.  He is currently undergoing current chemoradiation.  Prior to bronchoscope he and initiation of radiation he noted some hemoptysis however, this has now resolved.  He does not endorse any fevers, chills or sweats.  No shortness of breath.  No chest pain.  No orthopnea, paroxysmal nocturnal dyspnea nor lower extremity edema.  No calf tenderness.  This is a follow-up after his bronchoscopy.  He tolerated that procedure well without any sequela.  Overall he appears to be doing well.  Review of Systems A 10 point review of systems was performed and it is as noted above otherwise negative.  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[place]) 12/21/2014   Hyperlipidemia associated with type 2 diabetes mellitus ([place]) 12/21/2014   Centrilobular emphysema ([place]) 12/21/2014   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[place]) 12/21/2014   CKD (chronic kidney disease), stage III ([place]) 12/21/2014   Nicotine dependence, cigarettes, w unsp disorders 12/21/2014   Lumbago 12/21/2014   Gout 12/21/2014   Social History    Tobacco Use   Smoking status: Light Smoker    Packs/day: 0.50    Years: 53.00    Pack years: 26.50    Types: Cigarettes   Smokeless tobacco: Never   Tobacco comments:    Aprrox 10 cigs/day--02/21/2021  Substance Use Topics   Alcohol use: No    Alcohol/week: 0.0 standard drinks   No Known Allergies Current Meds  Medication Sig   acetaminophen (TYLENOL) 650 MG CR tablet Take 1,300 mg by mouth every 8 (eight) hours as needed for pain.   albuterol (VENTOLIN HFA) 108 (90 Base) MCG/ACT inhaler Inhale 2 puffs into the lungs every 6 (six) hours as needed for wheezing or shortness of breath.   allopurinol (ZYLOPRIM) 100 MG tablet Take 1 tablet (100 mg total) by mouth daily. (Patient taking differently: Take 100 mg by mouth in the morning.)   aspirin EC 81 MG tablet Take 81 mg by mouth in the morning. Swallow whole.   atorvastatin (LIPITOR) 80 MG tablet Take 1 tablet (80 mg total) by mouth daily at 6 PM.   clopidogrel (PLAVIX) 75 MG tablet Take 1 tablet (75 mg total) by mouth daily.   Ensure (ENSURE) Take 237 mLs by mouth daily.   lidocaine-prilocaine (EMLA) cream Apply to affected area once   metFORMIN (GLUCOPHAGE) 500 MG tablet Take 1 tablet (500 mg total) by mouth 2 (two) times daily with a meal.   metoprolol succinate (TOPROL XL) 25 MG 24 hr tablet Take 1 tablet (25 mg total) by mouth daily. (Patient taking differently: Take 25 mg by mouth in the morning.)  mirtazapine (REMERON) 7.5 MG tablet Take 1 tablet (7.5 mg total) by mouth at bedtime.   prochlorperazine (COMPAZINE) 10 MG tablet Take 1 tablet (10 mg total) by mouth every 6 (six) hours as needed (Nausea or vomiting).   ramipril (ALTACE) 10 MG capsule Take 1 capsule (10 mg total) by mouth daily. (Patient taking differently: Take 10 mg by mouth in the morning.)   tiotropium (SPIRIVA HANDIHALER) 18 MCG inhalation capsule PLACE 1 CAPSULE INTO INHALER AND INHALE ITS CONTENTS ONCE DAILY. (Patient taking differently: Place 18 mcg into  inhaler and inhale in the morning.)   vitamin B-12 (CYANOCOBALAMIN) 1000 MCG tablet Take 1,000 mcg by mouth daily.   Immunization History  Administered Date(s) Administered   Fluad Quad(high Dose 65+) 01/08/2021   Influenza, High Dose Seasonal PF 01/11/2016, 01/12/2017, 02/12/2018, 01/25/2019   Influenza,inj,Quad PF,6+ Mos 03/28/2015   Influenza-Unspecified 01/17/2020   Moderna Sars-Covid-2 Vaccination 06/18/2019, 07/16/2019, 02/15/2020, 08/10/2020   Pneumococcal Conjugate-13 08/12/2013   Pneumococcal Polysaccharide-23 02/06/2012   Td 11/24/2005   Tdap 08/04/2012       Objective:   Physical Exam BP 140/84 (BP Location: Left Arm, Patient Position: Sitting, Cuff Size: Normal)   Pulse 87   Temp (!) 97 F (36.1 C) (Oral)   Ht 5\' 9"  (1.753 m)   Wt 169 lb 3.2 oz (76.7 kg)   SpO2 100%   BMI 24.99 kg/m  GENERAL: Well-developed, well-nourished gentleman, no acute distress.  Fully ambulatory.  No conversational dyspnea. HEAD: Normocephalic, atraumatic.  EYES: Pupils equal, round, reactive to light.  No scleral icterus.  MOUTH: Nose/mouth/throat not examined due to masking requirements for COVID 19. NECK: Supple. No thyromegaly. Trachea midline. No JVD.  No adenopathy. PULMONARY: Good air entry bilaterally.  No adventitious sounds. CARDIOVASCULAR: S1 and S2. Regular rate and rhythm.  No rubs, murmurs or gallops heard. ABDOMEN: Benign. MUSCULOSKELETAL: No joint deformity, there is clubbing, no edema.  NEUROLOGIC: No focal deficit, no gait disturbance, speech is fluent. SKIN: Intact,warm,dry. PSYCH: Mood and behavior normal.     Assessment & Plan:     ICD-10-CM   1. Squamous cell carcinoma of lung, stage IIIa, right (HCC)  C34.91    Continue chemoradiation per oncology    2. Centrilobular emphysema ([place])  J43.2    Continue Spiriva We did provide him with as needed albuterol Follow-up 6 months time Call sooner if needed     Meds ordered this encounter  Medications    albuterol (VENTOLIN HFA) 108 (90 Base) MCG/ACT inhaler    Sig: Inhale 2 puffs into the lungs every 6 (six) hours as needed for wheezing or shortness of breath.    Dispense:  8 g    Refill:  2   She appears to be doing well post bronchoscopy.  No sequela from bronchoscopy.  Follow-up as noted above.  Renold Don, MD Advanced Bronchoscopy PCCM [facility]    *This note was dictated using voice recognition software/Dragon.  Despite best efforts to proofread, errors can occur which can change the meaning. Any transcriptional errors that result from this process are unintentional and may not be fully corrected at the time of dictation.

## 2021-04-03 DIAGNOSIS — N189 Chronic kidney disease, unspecified: Secondary | ICD-10-CM | POA: Diagnosis not present

## 2021-04-03 DIAGNOSIS — K4090 Unilateral inguinal hernia, without obstruction or gangrene, not specified as recurrent: Secondary | ICD-10-CM | POA: Diagnosis not present

## 2021-04-03 DIAGNOSIS — E1122 Type 2 diabetes mellitus with diabetic chronic kidney disease: Secondary | ICD-10-CM | POA: Diagnosis not present

## 2021-04-03 DIAGNOSIS — R590 Localized enlarged lymph nodes: Secondary | ICD-10-CM | POA: Diagnosis not present

## 2021-04-03 DIAGNOSIS — F17210 Nicotine dependence, cigarettes, uncomplicated: Secondary | ICD-10-CM | POA: Diagnosis not present

## 2021-04-03 DIAGNOSIS — C3411 Malignant neoplasm of upper lobe, right bronchus or lung: Secondary | ICD-10-CM | POA: Diagnosis not present

## 2021-04-03 DIAGNOSIS — C329 Malignant neoplasm of larynx, unspecified: Secondary | ICD-10-CM | POA: Diagnosis not present

## 2021-04-03 DIAGNOSIS — Z5111 Encounter for antineoplastic chemotherapy: Secondary | ICD-10-CM | POA: Diagnosis not present

## 2021-04-03 DIAGNOSIS — I130 Hypertensive heart and chronic kidney disease with heart failure and stage 1 through stage 4 chronic kidney disease, or unspecified chronic kidney disease: Secondary | ICD-10-CM | POA: Diagnosis not present

## 2021-04-03 DIAGNOSIS — I2510 Atherosclerotic heart disease of native coronary artery without angina pectoris: Secondary | ICD-10-CM | POA: Diagnosis not present

## 2021-04-03 DIAGNOSIS — Z510 Encounter for antineoplastic radiation therapy: Secondary | ICD-10-CM | POA: Diagnosis not present

## 2021-04-04 ENCOUNTER — Ambulatory Visit: Admission: RE | Admit: 2021-04-04 | Payer: Medicare Other | Source: Ambulatory Visit

## 2021-04-08 ENCOUNTER — Inpatient Hospital Stay (HOSPITAL_BASED_OUTPATIENT_CLINIC_OR_DEPARTMENT_OTHER): Payer: Medicare Other | Admitting: Oncology

## 2021-04-08 ENCOUNTER — Other Ambulatory Visit: Payer: Self-pay

## 2021-04-08 ENCOUNTER — Encounter: Payer: Self-pay | Admitting: Oncology

## 2021-04-08 ENCOUNTER — Inpatient Hospital Stay: Payer: Medicare Other

## 2021-04-08 ENCOUNTER — Ambulatory Visit
Admission: RE | Admit: 2021-04-08 | Discharge: 2021-04-08 | Disposition: A | Discharge status: Home / Self Care | Payer: Medicare Other | Source: Ambulatory Visit | Attending: Radiation Oncology | Admitting: Radiation Oncology

## 2021-04-08 VITALS — BP 127/79 | HR 62 | Temp 97.7°F | Resp 18

## 2021-04-08 VITALS — BP 136/79 | HR 66 | Temp 96.6°F | Resp 16 | Wt 168.4 lb

## 2021-04-08 DIAGNOSIS — C3490 Malignant neoplasm of unspecified part of unspecified bronchus or lung: Secondary | ICD-10-CM

## 2021-04-08 DIAGNOSIS — K4090 Unilateral inguinal hernia, without obstruction or gangrene, not specified as recurrent: Secondary | ICD-10-CM | POA: Diagnosis not present

## 2021-04-08 DIAGNOSIS — R590 Localized enlarged lymph nodes: Secondary | ICD-10-CM | POA: Diagnosis not present

## 2021-04-08 DIAGNOSIS — N289 Disorder of kidney and ureter, unspecified: Secondary | ICD-10-CM

## 2021-04-08 DIAGNOSIS — Z5111 Encounter for antineoplastic chemotherapy: Secondary | ICD-10-CM | POA: Diagnosis not present

## 2021-04-08 DIAGNOSIS — Z510 Encounter for antineoplastic radiation therapy: Secondary | ICD-10-CM | POA: Diagnosis not present

## 2021-04-08 DIAGNOSIS — I2510 Atherosclerotic heart disease of native coronary artery without angina pectoris: Secondary | ICD-10-CM | POA: Diagnosis not present

## 2021-04-08 DIAGNOSIS — I130 Hypertensive heart and chronic kidney disease with heart failure and stage 1 through stage 4 chronic kidney disease, or unspecified chronic kidney disease: Secondary | ICD-10-CM | POA: Diagnosis not present

## 2021-04-08 DIAGNOSIS — E1122 Type 2 diabetes mellitus with diabetic chronic kidney disease: Secondary | ICD-10-CM | POA: Diagnosis not present

## 2021-04-08 DIAGNOSIS — C3411 Malignant neoplasm of upper lobe, right bronchus or lung: Secondary | ICD-10-CM | POA: Diagnosis not present

## 2021-04-08 DIAGNOSIS — F17210 Nicotine dependence, cigarettes, uncomplicated: Secondary | ICD-10-CM | POA: Diagnosis not present

## 2021-04-08 DIAGNOSIS — N189 Chronic kidney disease, unspecified: Secondary | ICD-10-CM | POA: Diagnosis not present

## 2021-04-08 DIAGNOSIS — C329 Malignant neoplasm of larynx, unspecified: Secondary | ICD-10-CM | POA: Diagnosis not present

## 2021-04-08 DIAGNOSIS — C3401 Malignant neoplasm of right main bronchus: Secondary | ICD-10-CM

## 2021-04-08 LAB — CBC WITH DIFFERENTIAL/PLATELET
Abs Immature Granulocytes: 0.02 10*3/uL (ref 0.00–0.07)
Basophils Absolute: 0 10*3/uL (ref 0.0–0.1)
Basophils Relative: 0 %
Eosinophils Absolute: 1.7 10*3/uL — ABNORMAL HIGH (ref 0.0–0.5)
Eosinophils Relative: 21 %
HCT: 38 % — ABNORMAL LOW (ref 39.0–52.0)
Hemoglobin: 12.5 g/dL — ABNORMAL LOW (ref 13.0–17.0)
Immature Granulocytes: 0 %
Lymphocytes Relative: 19 %
Lymphs Abs: 1.6 10*3/uL (ref 0.7–4.0)
MCH: 30.9 pg (ref 26.0–34.0)
MCHC: 32.9 g/dL (ref 30.0–36.0)
MCV: 93.8 fL (ref 80.0–100.0)
Monocytes Absolute: 0.8 10*3/uL (ref 0.1–1.0)
Monocytes Relative: 10 %
Neutro Abs: 4 10*3/uL (ref 1.7–7.7)
Neutrophils Relative %: 50 %
Platelets: 181 10*3/uL (ref 150–400)
RBC: 4.05 MIL/uL — ABNORMAL LOW (ref 4.22–5.81)
RDW: 12.8 % (ref 11.5–15.5)
WBC: 8.2 10*3/uL (ref 4.0–10.5)
nRBC: 0 % (ref 0.0–0.2)

## 2021-04-08 LAB — COMPREHENSIVE METABOLIC PANEL
ALT: 11 U/L (ref 0–44)
AST: 12 U/L — ABNORMAL LOW (ref 15–41)
Albumin: 3.2 g/dL — ABNORMAL LOW (ref 3.5–5.0)
Alkaline Phosphatase: 82 U/L (ref 38–126)
Anion gap: 8 (ref 5–15)
BUN: 15 mg/dL (ref 8–23)
CO2: 24 mmol/L (ref 22–32)
Calcium: 8.7 mg/dL — ABNORMAL LOW (ref 8.9–10.3)
Chloride: 105 mmol/L (ref 98–111)
Creatinine, Ser: 0.75 mg/dL (ref 0.61–1.24)
GFR, Estimated: >60 mL/min (ref >60)
Glucose, Bld: 98 mg/dL (ref 70–99)
Potassium: 4 mmol/L (ref 3.5–5.1)
Sodium: 137 mmol/L (ref 135–145)
Total Bilirubin: 0.5 mg/dL (ref 0.3–1.2)
Total Protein: 6.1 g/dL — ABNORMAL LOW (ref 6.5–8.1)

## 2021-04-08 LAB — PSA: Prostatic Specific Antigen: 3.28 ng/mL (ref 0.00–4.00)

## 2021-04-08 MED ORDER — FAMOTIDINE 20 MG IN NS 100 ML IVPB
20.0000 mg | Freq: Once | INTRAVENOUS | Status: AC
  Administered 2021-04-08: 20 mg via INTRAVENOUS
  Filled 2021-04-08: qty 20

## 2021-04-08 MED ORDER — SODIUM CHLORIDE 0.9 % IV SOLN
187.8000 mg | Freq: Once | INTRAVENOUS | Status: AC
  Administered 2021-04-08: 13:00:00 190 mg via INTRAVENOUS
  Filled 2021-04-08: qty 19

## 2021-04-08 MED ORDER — PALONOSETRON HCL INJECTION 0.25 MG/5ML
0.2500 mg | Freq: Once | INTRAVENOUS | Status: AC
  Administered 2021-04-08: 11:00:00 0.25 mg via INTRAVENOUS
  Filled 2021-04-08: qty 5

## 2021-04-08 MED ORDER — SODIUM CHLORIDE 0.9 % IV SOLN
10.0000 mg | Freq: Once | INTRAVENOUS | Status: AC
  Administered 2021-04-08: 11:00:00 10 mg via INTRAVENOUS
  Filled 2021-04-08: qty 10

## 2021-04-08 MED ORDER — HEPARIN SOD (PORK) LOCK FLUSH 100 UNIT/ML IV SOLN
INTRAVENOUS | Status: AC
  Administered 2021-04-08: 14:00:00 500 [IU]
  Filled 2021-04-08: qty 5

## 2021-04-08 MED ORDER — DIPHENHYDRAMINE HCL 50 MG/ML IJ SOLN
50.0000 mg | Freq: Once | INTRAMUSCULAR | Status: AC
  Administered 2021-04-08: 11:00:00 50 mg via INTRAVENOUS
  Filled 2021-04-08: qty 1

## 2021-04-08 MED ORDER — SODIUM CHLORIDE 0.9 % IV SOLN
45.0000 mg/m2 | Freq: Once | INTRAVENOUS | Status: AC
  Administered 2021-04-08: 12:00:00 84 mg via INTRAVENOUS
  Filled 2021-04-08: qty 14

## 2021-04-08 MED ORDER — SODIUM CHLORIDE 0.9 % IV SOLN
Freq: Once | INTRAVENOUS | Status: AC
Start: 2021-04-08 — End: 2021-04-08
  Filled 2021-04-08: qty 250

## 2021-04-08 MED ORDER — HEPARIN SOD (PORK) LOCK FLUSH 100 UNIT/ML IV SOLN
500.0000 [IU] | Freq: Once | INTRAVENOUS | Status: AC | PRN
  Filled 2021-04-08: qty 5

## 2021-04-08 NOTE — Progress Notes (Signed)
Pt reports chronic cough but no other concerns or complaints at this time.

## 2021-04-08 NOTE — Patient Instructions (Signed)
[facility] [place]  Discharge Instructions: Thank you for choosing [facility] to provide your oncology and hematology care.  If you have a lab appointment with the [facility], please go directly to the [facility] and check in at the registration area.  Wear comfortable clothing and clothing appropriate for easy access to any Portacath or PICC line.   We strive to give you quality time with your provider. You may need to reschedule your appointment if you arrive late (15 or more minutes).  Arriving late affects you and other patients whose appointments are after yours.  Also, if you miss three or more appointments without notifying the office, you may be dismissed from the clinic at the providers discretion.      For prescription refill requests, have your pharmacy contact our office and allow 72 hours for refills to be completed.    Today you received the following chemotherapy and/or immunotherapy agents Taxol , carboplatin   Carboplatin injection What is this medication? CARBOPLATIN (KAR boe pla tin) is a chemotherapy drug. It targets fast dividing cells, like cancer cells, and causes these cells to die. This medicine is used to treat ovarian cancer and many other cancers. This medicine may be used for other purposes; ask your health care provider or pharmacist if you have questions. COMMON BRAND NAME(S): Paraplatin What should I tell my care team before I take this medication? They need to know if you have any of these conditions: blood disorders hearing problems kidney disease recent or ongoing radiation therapy an unusual or allergic reaction to carboplatin, cisplatin, other chemotherapy, other medicines, foods, dyes, or preservatives pregnant or trying to get pregnant breast-feeding How should I use this medication? This drug is usually given as an infusion into a vein. It is administered in a hospital or clinic by a specially trained  health care professional. Talk to your pediatrician regarding the use of this medicine in children. Special care may be needed. Overdosage: If you think you have taken too much of this medicine contact a poison control center or emergency room at once. NOTE: This medicine is only for you. Do not share this medicine with others. What if I miss a dose? It is important not to miss a dose. Call your doctor or health care professional if you are unable to keep an appointment. What may interact with this medication? medicines for seizures medicines to increase blood counts like filgrastim, pegfilgrastim, sargramostim some antibiotics like amikacin, gentamicin, neomycin, streptomycin, tobramycin vaccines Talk to your doctor or health care professional before taking any of these medicines: acetaminophen aspirin ibuprofen ketoprofen naproxen This list may not describe all possible interactions. Give your health care provider a list of all the medicines, herbs, non-prescription drugs, or dietary supplements you use. Also tell them if you smoke, drink alcohol, or use illegal drugs. Some items may interact with your medicine. What should I watch for while using this medication? Your condition will be monitored carefully while you are receiving this medicine. You will need important blood work done while you are taking this medicine. This drug may make you feel generally unwell. This is not uncommon, as chemotherapy can affect healthy cells as well as cancer cells. Report any side effects. Continue your course of treatment even though you feel ill unless your doctor tells you to stop. In some cases, you may be given additional medicines to help with side effects. Follow all directions for their use. Call your doctor or  health care professional for advice if you get a fever, chills or sore throat, or other symptoms of a cold or flu. Do not treat yourself. This drug decreases your body's ability to fight  infections. Try to avoid being around people who are sick. This medicine may increase your risk to bruise or bleed. Call your doctor or health care professional if you notice any unusual bleeding. Be careful brushing and flossing your teeth or using a toothpick because you may get an infection or bleed more easily. If you have any dental work done, tell your dentist you are receiving this medicine. Avoid taking products that contain aspirin, acetaminophen, ibuprofen, naproxen, or ketoprofen unless instructed by your doctor. These medicines may hide a fever. Do not become pregnant while taking this medicine. Women should inform their doctor if they wish to become pregnant or think they might be pregnant. There is a potential for serious side effects to an unborn child. Talk to your health care professional or pharmacist for more information. Do not breast-feed an infant while taking this medicine. What side effects may I notice from receiving this medication? Side effects that you should report to your doctor or health care professional as soon as possible: allergic reactions like skin rash, itching or hives, swelling of the face, lips, or tongue signs of infection - fever or chills, cough, sore throat, pain or difficulty passing urine signs of decreased platelets or bleeding - bruising, pinpoint red spots on the skin, black, tarry stools, nosebleeds signs of decreased red blood cells - unusually weak or tired, fainting spells, lightheadedness breathing problems changes in hearing changes in vision chest pain high blood pressure low blood counts - This drug may decrease the number of white blood cells, red blood cells and platelets. You may be at increased risk for infections and bleeding. nausea and vomiting pain, swelling, redness or irritation at the injection site pain, tingling, numbness in the hands or feet problems with balance, talking, walking trouble passing urine or change in the  amount of urine Side effects that usually do not require medical attention (report to your doctor or health care professional if they continue or are bothersome): hair loss loss of appetite metallic taste in the mouth or changes in taste This list may not describe all possible side effects. Call your doctor for medical advice about side effects. You may report side effects to FDA at 1-800-FDA-1088. Where should I keep my medication? This drug is given in a hospital or clinic and will not be stored at home. NOTE: This sheet is a summary. It may not cover all possible information. If you have questions about this medicine, talk to your doctor, pharmacist, or health care provider.  2022 Elsevier/Gold Standard (2007-09-15 00:00:00) Paclitaxel injection What is this medication? PACLITAXEL (PAK li TAX el) is a chemotherapy drug. It targets fast dividing cells, like cancer cells, and causes these cells to die. This medicine is used to treat ovarian cancer, breast cancer, lung cancer, Kaposi's sarcoma, and other cancers. This medicine may be used for other purposes; ask your health care provider or pharmacist if you have questions. COMMON BRAND NAME(S): Onxol, Taxol What should I tell my care team before I take this medication? They need to know if you have any of these conditions: history of irregular heartbeat liver disease low blood counts, like low white cell, platelet, or red cell counts lung or breathing disease, like asthma tingling of the fingers or toes, or other nerve disorder an unusual  or allergic reaction to paclitaxel, alcohol, polyoxyethylated castor oil, other chemotherapy, other medicines, foods, dyes, or preservatives pregnant or trying to get pregnant breast-feeding How should I use this medication? This drug is given as an infusion into a vein. It is administered in a hospital or clinic by a specially trained health care professional. Talk to your pediatrician regarding the  use of this medicine in children. Special care may be needed. Overdosage: If you think you have taken too much of this medicine contact a poison control center or emergency room at once. NOTE: This medicine is only for you. Do not share this medicine with others. What if I miss a dose? It is important not to miss your dose. Call your doctor or health care professional if you are unable to keep an appointment. What may interact with this medication? Do not take this medicine with any of the following medications: live virus vaccines This medicine may also interact with the following medications: antiviral medicines for hepatitis, HIV or AIDS certain antibiotics like erythromycin and clarithromycin certain medicines for fungal infections like ketoconazole and itraconazole certain medicines for seizures like carbamazepine, phenobarbital, phenytoin gemfibrozil nefazodone rifampin St. John's wort This list may not describe all possible interactions. Give your health care provider a list of all the medicines, herbs, non-prescription drugs, or dietary supplements you use. Also tell them if you smoke, drink alcohol, or use illegal drugs. Some items may interact with your medicine. What should I watch for while using this medication? Your condition will be monitored carefully while you are receiving this medicine. You will need important blood work done while you are taking this medicine. This medicine can cause serious allergic reactions. To reduce your risk you will need to take other medicine(s) before treatment with this medicine. If you experience allergic reactions like skin rash, itching or hives, swelling of the face, lips, or tongue, tell your doctor or health care professional right away. In some cases, you may be given additional medicines to help with side effects. Follow all directions for their use. This drug may make you feel generally unwell. This is not uncommon, as chemotherapy can  affect healthy cells as well as cancer cells. Report any side effects. Continue your course of treatment even though you feel ill unless your doctor tells you to stop. Call your doctor or health care professional for advice if you get a fever, chills or sore throat, or other symptoms of a cold or flu. Do not treat yourself. This drug decreases your body's ability to fight infections. Try to avoid being around people who are sick. This medicine may increase your risk to bruise or bleed. Call your doctor or health care professional if you notice any unusual bleeding. Be careful brushing and flossing your teeth or using a toothpick because you may get an infection or bleed more easily. If you have any dental work done, tell your dentist you are receiving this medicine. Avoid taking products that contain aspirin, acetaminophen, ibuprofen, naproxen, or ketoprofen unless instructed by your doctor. These medicines may hide a fever. Do not become pregnant while taking this medicine. Women should inform their doctor if they wish to become pregnant or think they might be pregnant. There is a potential for serious side effects to an unborn child. Talk to your health care professional or pharmacist for more information. Do not breast-feed an infant while taking this medicine. Men are advised not to father a child while receiving this medicine. This product may contain  alcohol. Ask your pharmacist or healthcare provider if this medicine contains alcohol. Be sure to tell all healthcare providers you are taking this medicine. Certain medicines, like metronidazole and disulfiram, can cause an unpleasant reaction when taken with alcohol. The reaction includes flushing, headache, nausea, vomiting, sweating, and increased thirst. The reaction can last from 30 minutes to several hours. What side effects may I notice from receiving this medication? Side effects that you should report to your doctor or health care professional  as soon as possible: allergic reactions like skin rash, itching or hives, swelling of the face, lips, or tongue breathing problems changes in vision fast, irregular heartbeat high or low blood pressure mouth sores pain, tingling, numbness in the hands or feet signs of decreased platelets or bleeding - bruising, pinpoint red spots on the skin, black, tarry stools, blood in the urine signs of decreased red blood cells - unusually weak or tired, feeling faint or lightheaded, falls signs of infection - fever or chills, cough, sore throat, pain or difficulty passing urine signs and symptoms of liver injury like dark yellow or brown urine; general ill feeling or flu-like symptoms; light-colored stools; loss of appetite; nausea; right upper belly pain; unusually weak or tired; yellowing of the eyes or skin swelling of the ankles, feet, hands unusually slow heartbeat Side effects that usually do not require medical attention (report to your doctor or health care professional if they continue or are bothersome): diarrhea hair loss loss of appetite muscle or joint pain nausea, vomiting pain, redness, or irritation at site where injected tiredness This list may not describe all possible side effects. Call your doctor for medical advice about side effects. You may report side effects to FDA at 1-800-FDA-1088. Where should I keep my medication? This drug is given in a hospital or clinic and will not be stored at home. NOTE: This sheet is a summary. It may not cover all possible information. If you have questions about this medicine, talk to your doctor, pharmacist, or health care provider.  2022 Elsevier/Gold Standard (2020-12-25 00:00:00)    To help prevent nausea and vomiting after your treatment, we encourage you to take your nausea medication as directed.  BELOW ARE SYMPTOMS THAT SHOULD BE REPORTED IMMEDIATELY: *FEVER GREATER THAN 100.4 F (38 C) OR HIGHER *CHILLS OR SWEATING *NAUSEA AND  VOMITING THAT IS NOT CONTROLLED WITH YOUR NAUSEA MEDICATION *UNUSUAL SHORTNESS OF BREATH *UNUSUAL BRUISING OR BLEEDING *URINARY PROBLEMS (pain or burning when urinating, or frequent urination) *BOWEL PROBLEMS (unusual diarrhea, constipation, pain near the anus) TENDERNESS IN MOUTH AND THROAT WITH OR WITHOUT PRESENCE OF ULCERS (sore throat, sores in mouth, or a toothache) UNUSUAL RASH, SWELLING OR PAIN  UNUSUAL VAGINAL DISCHARGE OR ITCHING   Items with * indicate a potential emergency and should be followed up as soon as possible or go to the Emergency Department if any problems should occur.  Please show the CHEMOTHERAPY ALERT CARD or IMMUNOTHERAPY ALERT CARD at check-in to the Emergency Department and triage nurse.  Should you have questions after your visit or need to cancel or reschedule your appointment, please contact [facility] [place]  [phone] and follow the prompts.  Office hours are 8:00 a.m. to 4:30 p.m. Monday - Friday. Please note that voicemails left after 4:00 p.m. may not be returned until the following business day.  We are closed weekends and major holidays. You have access to a nurse at all times for urgent questions. Please call the main number to the  clinic [phone] and follow the prompts.  For any non-urgent questions, you may also contact your provider using MyChart. We now offer e-Visits for anyone 28 and older to request care online for non-urgent symptoms. For details visit [email or web address].   Also download the MyChart app! Go to the app store, search "MyChart", open the app, select [facility], and log in with your MyChart username and password.  Due to Covid, a mask is required upon entering the hospital/clinic. If you do not have a mask, one will be given to you upon arrival. For doctor visits, patients may have 1 support person aged 57 or older with them. For treatment visits, patients cannot have anyone with them due to  current Covid guidelines and our immunocompromised population.

## 2021-04-08 NOTE — Progress Notes (Signed)
Hematology/Oncology Follow up note [phone] [phone]   Patient Care Team: Venita Lick, NP as PCP - General (Nurse Practitioner) Wellington Hampshire, MD as PCP - Cardiology (Cardiology) Beverly Gust, MD (Otolaryngology) Telford Nab, RN as Registered Nurse [name], MD as Radiation Oncologist (Radiation Oncology)  REFERRING PROVIDER: Venita Lick, NP REASON FOR VISIT Follow up for stage III squamous cell carcinoma.  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  He got definitive radiation to the larynx, and the patient has been doing annual checkup with ENT Dr. Tami Ribas.  # 09/02/2017 ENB biopsy of both lung nodule. Pathology of biopsies showed no lesional cells identified. However he is a high risk patient and negative biopsy results do not exclude malignancy.  May 2019 Patient underwent SBRT to the left lung lesion.  # 02/25/2019 CT scan showed right upper lobe nodule interval increase in size.  #03/2019 he finished SBRT to right upper lobe lesion.   01/24/2021, CT with contrast showed progressive right upper lobe malignancy, while separated from the site of regional right upper lobe lesion.  2 masslike areas, including the more central of these which directly invades the mediastinum, with potential direct tracheal invasion.  Progressive right hilar and subcarinal lymphadenopathy.  Left lower lobe at the site of previously noted left lower lobe lesion, there has been progressive septal thickening and nodularity which has slowly increased over numerous prior examinations.  Highly concerning for locally recurrent disease.  Aortic atherosclerosis, in addition to  three-vessel coronary artery disease.  Calcification of aortic valve.  1.7 cm lesion in the posterior aspect of the interpolar region of the right kidney.  Suspicious for small neoplasm.  02/11/2021, PET scan showed hypermetabolic central right upper lobe/perihilar recurrence with adjacent contiguous right paratracheal adenopathy.  No distal or progressive metastasis identified.  Low-level hypermetabolic activity in the right groin associated with it.  Postsurgical changes.  Coronary and aortic atherosclerosis.  #03/06/2021 status post biopsy via bronchoscopy. Pathology showed right mainstem bronchus positive for squamous cell carcinoma. Right mainstem bronchus brushings positive for SCC, lavage was suspicious for malignancy. Subcarinal lymph node negative.  precarinal lymph node negative.  03/26/2021 MRI brain with and without contrast showed age-related atrophy.  Minimal chronic small vessel ischemic changes.  No evidence of metastatic disease.  Incidental tiny cortical infarction.  INTERVAL HISTORY Philip Wood is a 75 y.o. male who has above history reviewed by me today presents for follow-up visit for management of stage III squamous cell carcinoma Patient presents for evaluation prior to chemotherapy.  Patient has had a chemotherapy class and medi port was placed on  03/29/2021 by IR He has started radiation today.  No new complaints   Review of Systems  Constitutional:  Negative for appetite change, chills, fatigue, fever and unexpected weight change.  HENT:   Negative for hearing loss and voice change.   Eyes:  Negative for eye problems and icterus.  Respiratory:  Positive for shortness of breath. Negative for chest tightness and cough.   Cardiovascular:  Negative for chest pain and leg swelling.  Gastrointestinal:  Negative for abdominal distention and abdominal pain.  Endocrine: Negative for hot flashes.  Genitourinary:  Negative for difficulty urinating,  dysuria and frequency.    Musculoskeletal:  Negative for arthralgias.  Skin:  Negative for itching and rash.  Neurological:  Negative for light-headedness and numbness.  Hematological:  Negative for adenopathy. Does not bruise/bleed easily.  Psychiatric/Behavioral:  Negative for confusion.      MEDICAL HISTORY:  Past Medical History:  Diagnosis Date   Arthritis    Bruit of left carotid artery    Cancer ([place])    laryngeal   Chronic heart failure (HCC)    Chronic kidney disease    COPD (chronic obstructive pulmonary disease) (HCC)    Coronary artery disease    Diabetes mellitus without complication (HCC)    GERD (gastroesophageal reflux disease)    Headache    History of kidney stones    Hyperlipidemia    Hypertension    Lumbago    Neuropathy    Tobacco abuse disorder     SURGICAL HISTORY: Past Surgical History:  Procedure Laterality Date   ARTERY BIOPSY Right 11/14/2020   Procedure: BIOPSY TEMPORAL ARTERY;  Surgeon: Katha Cabal, MD;  Location: [facility];  Service: Vascular;  Laterality: Right;   CAROTID PTA/STENT INTERVENTION Left 01/30/2021   Procedure: CAROTID PTA/STENT INTERVENTION;  Surgeon: Katha Cabal, MD;  Location: [place] [facility];  Service: Cardiovascular;  Laterality: Left;   CHOLECYSTECTOMY     ELECTROMAGNETIC NAVIGATION BROCHOSCOPY N/A 08/19/2017   Procedure: ELECTROMAGNETIC NAVIGATION BRONCHOSCOPY;  Surgeon: Flora Lipps, MD;  Location: [facility];  Service: Cardiopulmonary;  Laterality: N/A;   ELECTROMAGNETIC NAVIGATION BROCHOSCOPY Left 09/02/2017   Procedure: ELECTROMAGNETIC NAVIGATION BRONCHOSCOPY;  Surgeon: Flora Lipps, MD;  Location: [facility];  Service: Cardiopulmonary;  Laterality: Left;   IR IMAGING GUIDED PORT INSERTION  03/29/2021   THROAT SURGERY     VIDEO BRONCHOSCOPY WITH ENDOBRONCHIAL ULTRASOUND N/A 03/06/2021   Procedure: VIDEO BRONCHOSCOPY WITH ENDOBRONCHIAL ULTRASOUND;  Surgeon: Tyler Pita, MD;  Location: [facility];  Service: Cardiopulmonary;   Laterality: N/A;    SOCIAL HISTORY: Social History   Socioeconomic History   Marital status: Widowed    Spouse name: Not on file   Number of children: Not on file   Years of education: Not on file   Highest education level: 10th grade  Occupational History   Not on file  Tobacco Use   Smoking status: Light Smoker    Packs/day: 0.50    Years: 53.00    Pack years: 26.50    Types: Cigarettes   Smokeless tobacco: Never   Tobacco comments:    Aprrox 10 cigs/day--02/21/2021  Vaping Use   Vaping Use: Never used  Substance and Sexual Activity   Alcohol use: No    Alcohol/week: 0.0 standard drinks   Drug use: No   Sexual activity: Not Currently  Other Topics Concern   Not on file  Social History Narrative   Works part time at [employer] retired   Investment banker, operational of Radio broadcast assistant Strain: Low Risk    Difficulty of Paying Living Expenses: Not hard at all  Food Insecurity: No Food Insecurity   Worried About Charity fundraiser in the Last Year: Never true   Arboriculturist in the Last Year: Never true  Transportation Needs: No Transportation Needs   Lack of Transportation (Medical): No   Lack of Transportation (Non-Medical): No  Physical Activity: Inactive   Days of Exercise per Week: 0 days   Minutes of Exercise per Session: 0 min  Stress: No Stress Concern Present   Feeling  of Stress : Not at all  Social Connections: Not on file  Intimate Partner Violence: Not on file    FAMILY HISTORY: Family History  Problem Relation Age of Onset   Heart disease Father    Heart attack Father    Brain cancer Sister    Cervical cancer Sister    Stomach cancer Sister    Stomach cancer Sister    Diabetes Sister    Lupus Sister    Rectal cancer Brother    Lung cancer Brother    Lung cancer Brother    Lung cancer Brother    Lung cancer Brother     ALLERGIES:  has No Known Allergies.  MEDICATIONS:  Current Outpatient Medications  Medication Sig Dispense Refill    acetaminophen (TYLENOL) 650 MG CR tablet Take 1,300 mg by mouth every 8 (eight) hours as needed for pain.     albuterol (VENTOLIN HFA) 108 (90 Base) MCG/ACT inhaler Inhale 2 puffs into the lungs every 6 (six) hours as needed for wheezing or shortness of breath. 8 g 2   allopurinol (ZYLOPRIM) 100 MG tablet Take 1 tablet (100 mg total) by mouth daily. (Patient taking differently: Take 100 mg by mouth in the morning.) 90 tablet 4   aspirin EC 81 MG tablet Take 81 mg by mouth in the morning. Swallow whole.     atorvastatin (LIPITOR) 80 MG tablet Take 1 tablet (80 mg total) by mouth daily at 6 PM. 90 tablet 4   clopidogrel (PLAVIX) 75 MG tablet Take 1 tablet (75 mg total) by mouth daily. 30 tablet 11   Ensure (ENSURE) Take 237 mLs by mouth daily.     lidocaine-prilocaine (EMLA) cream Apply to affected area once 30 g 3   metFORMIN (GLUCOPHAGE) 500 MG tablet Take 1 tablet (500 mg total) by mouth 2 (two) times daily with a meal. 180 tablet 4   metoprolol succinate (TOPROL XL) 25 MG 24 hr tablet Take 1 tablet (25 mg total) by mouth daily. (Patient taking differently: Take 25 mg by mouth in the morning.) 90 tablet 4   mirtazapine (REMERON) 7.5 MG tablet Take 1 tablet (7.5 mg total) by mouth at bedtime. 90 tablet 4   prochlorperazine (COMPAZINE) 10 MG tablet Take 1 tablet (10 mg total) by mouth every 6 (six) hours as needed (Nausea or vomiting). 30 tablet 1   ramipril (ALTACE) 10 MG capsule Take 1 capsule (10 mg total) by mouth daily. (Patient taking differently: Take 10 mg by mouth in the morning.) 90 capsule 4   tiotropium (SPIRIVA HANDIHALER) 18 MCG inhalation capsule PLACE 1 CAPSULE INTO INHALER AND INHALE ITS CONTENTS ONCE DAILY. (Patient taking differently: Place 18 mcg into inhaler and inhale in the morning.) 90 capsule 4   vitamin B-12 (CYANOCOBALAMIN) 1000 MCG tablet Take 1,000 mcg by mouth daily.     No current facility-administered medications for this visit.     PHYSICAL EXAMINATION: ECOG  PERFORMANCE STATUS: 1 - Symptomatic but completely ambulatory Vitals:   04/08/21 0913  BP: 136/79  Pulse: 66  Resp: 16  Temp: (!) 96.6 F (35.9 C)  SpO2: 99%   Filed Weights   04/08/21 0913  Weight: 168 lb 6.4 oz (76.4 kg)  Physical Exam Constitutional:      General: He is not in acute distress.    Appearance: He is not diaphoretic.  HENT:     Head: Normocephalic and atraumatic.     Nose: Nose normal.     Mouth/Throat:  Pharynx: No oropharyngeal exudate.  Eyes:     General: No scleral icterus.    Pupils: Pupils are equal, round, and reactive to light.  Cardiovascular:     Rate and Rhythm: Normal rate and regular rhythm.     Heart sounds: No murmur heard. Pulmonary:     Effort: Pulmonary effort is normal. No respiratory distress.     Breath sounds: No rales.     Comments: Decreased breath sound bilaterally Chest:     Chest wall: No tenderness.  Abdominal:     General: There is no distension.     Palpations: Abdomen is soft.     Tenderness: There is no abdominal tenderness.  Musculoskeletal:        General: Normal range of motion.     Cervical back: Normal range of motion and neck supple.  Skin:    General: Skin is warm and dry.     Findings: No erythema.  Neurological:     Mental Status: He is alert and oriented to person, place, and time.     Cranial Nerves: No cranial nerve deficit.     Motor: No abnormal muscle tone.     Coordination: Coordination normal.  Psychiatric:        Mood and Affect: Affect normal.          LABORATORY DATA:  I have reviewed the data as listed Lab Results  Component Value Date   WBC 8.2 04/08/2021   HGB 12.5 (L) 04/08/2021   HCT 38.0 (L) 04/08/2021   MCV 93.8 04/08/2021   PLT 181 04/08/2021   Recent Labs    05/31/20 0904 07/30/20 0759 10/15/20 1535 11/13/20 1152 11/28/20 0904 01/24/21 1010 01/30/21 0740 01/31/21 0738 04/08/21 0902  NA 141  --  139   < > 142  --   --  137 137  K 4.8  --  4.4   < > 5.0  --    --  4.4 4.0  CL 101  --  99   < > 103  --   --  104 105  CO2 26  --  24   < > 25  --   --  28 24  GLUCOSE 96  --  101*   < > 103*  --   --  109* 98  BUN 16  --  15   < > 13  --  '19 17 15  ' CREATININE 1.15   < > 1.01   < > 0.95   < > 0.93 0.85 0.75  CALCIUM 9.0  --  8.9   < > 9.5  --   --  8.5* 8.7*  GFRNONAA 62  --   --    < >  --   --  >60 >60 >60  GFRAA 72  --   --   --   --   --   --   --   --   PROT  --   --  5.9*  --  6.1  --   --   --  6.1*  ALBUMIN  --   --  3.9  --  4.0  --   --   --  3.2*  AST  --   --  11  --  6  --   --   --  12*  ALT  --   --  11  --  10  --   --   --  11  ALKPHOS  --   --  108  --  126*  --   --   --  82  BILITOT  --   --  0.2  --  0.3  --   --   --  0.5   < > = values in this interval not displayed.    RADIOGRAPHIC STUDIES: I have personally reviewed the radiological images as listed and agreed with the findings in the report. CT Chest W Contrast  Addendum Date: 01/26/2021   ADDENDUM REPORT: 01/26/2021 12:24 ADDENDUM: As mentioned in the body of the report there is a 1.7 cm lesion in the posterior aspect of the interpolar region of the right kidney, suspicious for small neoplasm, either a renal cell carcinoma or metastatic lesion of the kidney. This could be further evaluated with nonemergent abdominal MRI with and without IV gadolinium if clinically appropriate. Electronically Signed   By: Vinnie Langton M.D.   On: 01/26/2021 12:24   Result Date: 01/26/2021 CLINICAL DATA:  75 year old male with history of non-small cell lung cancer treated with radiation therapy presenting with 1 week of hemoptysis and cough. Follow-up study. EXAM: CT CHEST WITH CONTRAST TECHNIQUE: Multidetector CT imaging of the chest was performed during intravenous contrast administration. CONTRAST:  62m OMNIPAQUE IOHEXOL 350 MG/ML SOLN COMPARISON:  CT of the chest 07/30/2020. FINDINGS: Cardiovascular: Heart size is normal. There is no significant pericardial fluid, thickening or  pericardial calcification. There is aortic atherosclerosis, as well as atherosclerosis of the great vessels of the mediastinum and the coronary arteries, including calcified atherosclerotic plaque in the left anterior descending, left circumflex and right coronary arteries. Thickening calcification of the aortic valve. Mild calcification of the mitral annulus. Mediastinum/Nodes: Enlarging subcarinal (axial image 72 of series 2 measuring 1.7 cm in short axis) and right hilar (axial image 71 of series 2) measuring 1.7 cm) lymph nodes, highly concerning for metastatic disease. No left hilar lymphadenopathy. Esophagus is unremarkable in appearance. No axillary lymphadenopathy. Lungs/Pleura: Definitive enlargement of a mass-like area with convex margins in the right upper lobe (axial image 50 of series 2 and coronal image 85 of series 4) which currently measures 2.0 x 4.3 x 2.5 cm, compatible with progressive right upper lobe neoplasm. In association with this there is a more central mass in the right upper lobe (axial image 59 of series 2 and coronal image 79 of series 4) measuring 5.0 x 4.0 x 2.3 cm which directly invades the mediastinum and is now completely obscuring the fat plane between the lesion and the adjacent distal trachea and proximal right mainstem bronchus. There is a contour abnormality in the region of the distal trachea best appreciated on coronal images 80-82 of series 4, such that early direct invasion into the trachea is not excluded. Slight progression of area of septal thickening and nodularity in the left lower lobe, best appreciated on axial image 115 of series 3 where the bulkiest portion of this region measures approximately 2.1 x 2.1 cm. No acute consolidative airspace disease. No pleural effusions. Diffuse bronchial wall thickening with mild centrilobular and paraseptal emphysema. Upper Abdomen: Status post cholecystectomy. Mild intra and extrahepatic biliary ductal dilatation, similar to  prior studies, presumably reflective of benign post cholecystectomy physiology. Aortic atherosclerosis. Severe atrophy of the right kidney with multiple small lesions, most concerning of which is a 1.7 cm intermediate attenuation (43 HU) lesion extending off the posterior aspect of the interpolar region (axial image 179 of series 2), new compared to prior PET-CT. Musculoskeletal: There are no aggressive appearing lytic or blastic  lesions noted in the visualized portions of the skeleton. IMPRESSION: 1. Progressive right upper lobe malignancy. Notably, this appears well separated from the site of the regional right upper lobe lesion which was in the anterior aspect of the right upper lobe. On today's study, there are 2 mass-like areas, including the more central of these which directly invades the mediastinum, with potential direct tracheal invasion, as well as progressive right hilar and subcarinal lymphadenopathy. 2. In addition, in the left lower lobe at the site of the previously noted left lower lobe lesion there has been progressive septal thickening and nodularity which has slowly increased over numerous prior examinations. Findings are highly concerning for locally recurrent disease. 3. Aortic atherosclerosis, in addition to 3 vessel coronary artery disease. Assessment for potential risk factor modification, dietary therapy or pharmacologic therapy may be warranted, if clinically indicated. 4. There are calcifications of the aortic valve. Echocardiographic correlation for evaluation of potential valvular dysfunction may be warranted if clinically indicated. These results will be called to the ordering clinician or representative by the Radiologist Assistant, and communication documented in the PACS or [employer]. Aortic Atherosclerosis (ICD10-I70.0) and Emphysema (ICD10-J43.9). Electronically Signed: By: Vinnie Langton M.D. On: 01/26/2021 10:07   MR Brain W Wo Contrast  Result Date:  03/26/2021 CLINICAL DATA:  Squamous cell carcinoma of the right lung.  Staging. EXAM: MRI HEAD WITHOUT AND WITH CONTRAST TECHNIQUE: Multiplanar, multiecho pulse sequences of the brain and surrounding structures were obtained without and with intravenous contrast. CONTRAST:  7.79m GADAVIST GADOBUTROL 1 MMOL/ML IV SOLN COMPARISON:  10/16/2020 FINDINGS: Brain: Punctate focus of restricted diffusion at the right parietal vertex, diffusion imaging 40 and FLAIR image 46. This was not present in June, and is most consistent with a punctate cortical infarction. No abnormal enhancement occurs in this location. Elsewhere, the brain shows generalized atrophy with a few old small vessel insults of the deep white matter. No sign of mass lesion, hemorrhage, hydrocephalus or extra-axial collection. Vascular: Major vessels at the base of the brain show flow. Skull and upper cervical spine: Negative Sinuses/Orbits: Clear/normal Other: None IMPRESSION: Age related atrophy. Minimal chronic small-vessel ischemic change of the white matter. No evidence of metastatic disease. Punctate focus of restricted diffusion and T2/FLAIR signal at the right parietal vertex as described above, most consistent with an incidental tiny cortical infarction. Tiny nonenhancing metastasis is not excluded but would be less likely. Electronically Signed   By: MNelson ChimesM.D.   On: 03/26/2021 10:42   PERIPHERAL VASCULAR CATHETERIZATION  Result Date: 01/30/2021 See surgical note for result.  NM PET Image Restag (PS) Skull Base To Thigh  Result Date: 02/13/2021 CLINICAL DATA:  Subsequent treatment strategy for non-small cell lung cancer. Previous radiation therapy. Findings suspicious for local recurrence on recent chest CT EXAM: NUCLEAR MEDICINE PET SKULL BASE TO THIGH TECHNIQUE: 9.4 mCi F-18 FDG was injected intravenously. Full-ring PET imaging was performed from the skull base to thigh after the radiotracer. CT data was obtained and used for  attenuation correction and anatomic localization. Fasting blood glucose: 111 mg/dl COMPARISON:  CT chest 01/24/2021 and 07/30/2020.  PET-CT 03/10/2019. FINDINGS: Mediastinal blood pool activity: SUV max 1.5 NECK: No hypermetabolic cervical lymph nodes are identified.There are no lesions of the pharyngeal mucosal space. Incidental CT findings: Left carotid stent noted. CHEST: The recurrent right suprahilar mass demonstrates peripheral hypermetabolic activity which is greatest medially (SUV max 7.6), consistent with local recurrence. This measures approximately 3.4 x 3.2 cm on image 94/3. There is contiguous  right paratracheal hypermetabolic adenopathy. The mildly prominent subcarinal node seen on the recent CT demonstrates no hypermetabolic activity. The superior extent of the pulmonary component demonstrates no significant metabolic activity. There is no peripheral hypermetabolic pulmonary activity. Specifically, no hypermetabolic activity is demonstrated associated with the increased nodular septal thickening in the left lower lobe. Incidental CT findings: Atherosclerosis of the aorta, great vessels and coronary arteries. No significant pleural or pericardial effusion. Radiation changes in the central right upper lobe with left lower lobe scarring. No suspicious peripheral pulmonary nodules. ABDOMEN/PELVIS: There is no hypermetabolic activity within the liver, adrenal glands, spleen or pancreas. There is no hypermetabolic nodal activity. There are apparent postsurgical changes in the right inguinal region with associated low level hypermetabolic activity (SUV max 2.7). Incidental CT findings: Severe right renal atrophy with cortical thinning. Diffuse aortic and branch vessel atherosclerosis, prior cholecystectomy, probable left periaortic lymphangioma (2.0 cm on image 173/3), periampullary duodenal diverticulum and moderate enlargement of the prostate gland are again noted. There is a right inguinal hernia  containing fat and an associated small right scrotal hydrocele. SKELETON: There is no hypermetabolic activity to suggest osseous metastatic disease. Incidental CT findings: none IMPRESSION: 1. Hypermetabolic central right upper lobe/perihilar recurrence with adjacent contiguous right paratracheal adenopathy. 2. No distant or progressive metastases identified. No suspicious peripheral lung hypermetabolic activity. 3. Low level hypermetabolic activity in the right groin associated with apparent postsurgical changes, presumably from recent carotid stent placement. 4. Coronary and Aortic Atherosclerosis (ICD10-I70.0). Electronically Signed   By: Richardean Sale M.D.   On: 02/13/2021 09:39   DG Chest Port 1 View  Result Date: 03/06/2021 CLINICAL DATA:  Status post bronchoscopy EXAM: PORTABLE CHEST 1 VIEW COMPARISON:  Chest radiograph from 07/17/2017, CT chest 01/24/2021 FINDINGS: The cardiomediastinal silhouette is normal. Is peculiar did mass is seen in the right suprahilar region with surrounding architectural distortion as seen on the recent CT chest. There is no evidence of pneumothorax following bronchoscopy. The lungs are otherwise clear. There is no pleural effusion. There is no acute osseous abnormality. IMPRESSION: 1. No evidence of pneumothorax following bronchoscopy. 2. Right upper lobe mass as seen on prior chest CT. Electronically Signed   By: Valetta Mole M.D.   On: 03/06/2021 14:28   IR IMAGING GUIDED PORT INSERTION  Result Date: 03/29/2021 INDICATION: RIGHT lung cancer. EXAM: IMPLANTED PORT A CATH PLACEMENT WITH ULTRASOUND AND FLUOROSCOPIC GUIDANCE MEDICATIONS: None ANESTHESIA/SEDATION: Moderate (conscious) sedation was employed during this procedure. A total of Versed 1 mg and Fentanyl 50 mcg was administered intravenously. Moderate Sedation Time: 26 minutes. The patient's level of consciousness and vital signs were monitored continuously by radiology nursing throughout the procedure under my  direct supervision. FLUOROSCOPY TIME:  0 minutes, 12 seconds (0.9 mGy) COMPLICATIONS: None immediate. PROCEDURE: The procedure, risks, benefits, and alternatives were explained to the patient. Questions regarding the procedure were encouraged and answered. The patient understands and consents to the procedure. The LEFT neck and chest were prepped with chlorhexidine in a sterile fashion, and a sterile drape was applied covering the operative field. Maximum barrier sterile technique with sterile gowns and gloves were used for the procedure. A timeout was performed prior to the initiation of the procedure. Local anesthesia was provided with 1% lidocaine with epinephrine. After creating a small venotomy incision, a micropuncture kit was utilized to access the internal jugular vein under direct, real-time ultrasound guidance. Ultrasound image documentation was performed. The microwire was kinked to measure appropriate catheter length. A subcutaneous port pocket was then  created along the upper chest wall utilizing a combination of sharp and blunt dissection. The pocket was irrigated with sterile saline. A single lumen ISP power injectable port was chosen for placement. The 8 Fr catheter was tunneled from the port pocket site to the venotomy incision. The port was placed in the pocket. The external catheter was trimmed to appropriate length. At the venotomy, an 8 Fr peel-away sheath was placed over a guidewire under fluoroscopic guidance. The catheter was then placed through the sheath and the sheath was removed. Final catheter positioning was confirmed and documented with a fluoroscopic spot radiograph. The port was accessed with a Huber needle, aspirated and flushed with heparinized saline. The port pocket incision was closed with interrupted 3-0 Vicryl suture then Dermabond was applied, including at the venotomy incision. Dressings were placed. The patient tolerated the procedure well without immediate post procedural  complication. IMPRESSION: Successful placement of a LEFT internal jugular approach power injectable Port-A-Cath. The tip of the catheter is located at the proximal RIGHT atrium. The catheter is ready for immediate use. Michaelle Birks, MD [facility] Electronically Signed   By: Michaelle Birks M.D.   On: 03/29/2021 11:58      ASSESSMENT & PLAN:  1. Squamous cell carcinoma of larynx (HCC)   2. Malignant neoplasm of lung, unspecified laterality, unspecified part of lung ([place])   3. Kidney lesion   4. Encounter for antineoplastic chemotherapy    Cancer Staging  Lung cancer [facility]) Staging form: Lung, AJCC 8th Edition - Clinical: Stage IIIA (rcT4, cN1, cM0) - Signed by Earlie Server, MD on 02/13/2021  #Presumed lung cancer, status post SBRT of left lung nodule in 2019 and SBRP of right upper lobe nodule . 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  Pathology report reviewed and discussed with patient.  Even though the precarinal and subcarinal lymph nodes are negative, clinically patient has hypermetabolic perihilar recurrence with contiguous right paratracheal adenopathy, strongly suspicious for lymph node involvement. Recommend concurrent chemo and radiation. Labs reviewed and discussed with patient Proceed with cycle 1 weekly carboplatin and Taxol.  # Supportive care measures are necessary for patient well-being and will be provided as necessary. We spent sufficient time to discuss many aspect of care, questions were answered to patient's satisfaction.  # Squamous cell carcinoma of larynx, recommend he continue to have annual check up with ENT Dr.McQueen.  #Right kidney lesion, not well characterized on PET scan.   will obtain MRI renal protocol for further evaluation of the kidney lesion once he finishes treatment for lung cancer..  We spent sufficient time to discuss many aspect of care, questions were answered to patient's  satisfaction.  Earlie Server, MD, PhD  04/08/2021

## 2021-04-09 ENCOUNTER — Ambulatory Visit
Admission: RE | Admit: 2021-04-09 | Discharge: 2021-04-09 | Disposition: A | Discharge status: Home / Self Care | Payer: Medicare Other | Source: Ambulatory Visit | Attending: Radiation Oncology | Admitting: Radiation Oncology

## 2021-04-09 ENCOUNTER — Telehealth: Payer: Self-pay | Admitting: *Deleted

## 2021-04-09 DIAGNOSIS — C329 Malignant neoplasm of larynx, unspecified: Secondary | ICD-10-CM | POA: Diagnosis not present

## 2021-04-09 DIAGNOSIS — R590 Localized enlarged lymph nodes: Secondary | ICD-10-CM | POA: Diagnosis not present

## 2021-04-09 DIAGNOSIS — Z510 Encounter for antineoplastic radiation therapy: Secondary | ICD-10-CM | POA: Diagnosis not present

## 2021-04-09 DIAGNOSIS — C3411 Malignant neoplasm of upper lobe, right bronchus or lung: Secondary | ICD-10-CM | POA: Diagnosis not present

## 2021-04-09 DIAGNOSIS — K4090 Unilateral inguinal hernia, without obstruction or gangrene, not specified as recurrent: Secondary | ICD-10-CM | POA: Diagnosis not present

## 2021-04-09 DIAGNOSIS — Z5111 Encounter for antineoplastic chemotherapy: Secondary | ICD-10-CM | POA: Diagnosis not present

## 2021-04-09 DIAGNOSIS — E1122 Type 2 diabetes mellitus with diabetic chronic kidney disease: Secondary | ICD-10-CM | POA: Diagnosis not present

## 2021-04-09 DIAGNOSIS — I130 Hypertensive heart and chronic kidney disease with heart failure and stage 1 through stage 4 chronic kidney disease, or unspecified chronic kidney disease: Secondary | ICD-10-CM | POA: Diagnosis not present

## 2021-04-09 DIAGNOSIS — F17210 Nicotine dependence, cigarettes, uncomplicated: Secondary | ICD-10-CM | POA: Diagnosis not present

## 2021-04-09 DIAGNOSIS — I2510 Atherosclerotic heart disease of native coronary artery without angina pectoris: Secondary | ICD-10-CM | POA: Diagnosis not present

## 2021-04-09 DIAGNOSIS — N189 Chronic kidney disease, unspecified: Secondary | ICD-10-CM | POA: Diagnosis not present

## 2021-04-09 NOTE — Telephone Encounter (Signed)
RN Contacted patient after his first chemotherapy treatment (weekly carbo/taxol + radiation therapy). Patient is doing well. He has not complaints or concerns at this time. He stated that he tolerated his chemotherapy well yesterday. He does not have any OTC meds (imodium or stool softners) in his medicine cabinet at home. He has compazine available for nausea if needed, but so far, he has not needed any antiemetics. I reiterated when to use these interventions and encourage the patient to pick up these OTC meds to have on hand (to use prn). He gave verbal understanding. He inquired about what to expect in the area of alopecia. He didn't know if he would loose his facial hair or not. I explained to him that chemotherapy can cause hair loss in any part of his body (including facial hair and not just his scalp). Pt thanked me for reviewing this information with him. He will keep our office informed should he have any additional concerns or symptoms.

## 2021-04-10 ENCOUNTER — Ambulatory Visit
Admission: RE | Admit: 2021-04-10 | Discharge: 2021-04-10 | Disposition: A | Discharge status: Home / Self Care | Payer: Medicare Other | Source: Ambulatory Visit | Attending: Radiation Oncology | Admitting: Radiation Oncology

## 2021-04-10 DIAGNOSIS — R590 Localized enlarged lymph nodes: Secondary | ICD-10-CM | POA: Diagnosis not present

## 2021-04-10 DIAGNOSIS — K4090 Unilateral inguinal hernia, without obstruction or gangrene, not specified as recurrent: Secondary | ICD-10-CM | POA: Diagnosis not present

## 2021-04-10 DIAGNOSIS — I130 Hypertensive heart and chronic kidney disease with heart failure and stage 1 through stage 4 chronic kidney disease, or unspecified chronic kidney disease: Secondary | ICD-10-CM | POA: Diagnosis not present

## 2021-04-10 DIAGNOSIS — C329 Malignant neoplasm of larynx, unspecified: Secondary | ICD-10-CM | POA: Diagnosis not present

## 2021-04-10 DIAGNOSIS — E1122 Type 2 diabetes mellitus with diabetic chronic kidney disease: Secondary | ICD-10-CM | POA: Diagnosis not present

## 2021-04-10 DIAGNOSIS — I2510 Atherosclerotic heart disease of native coronary artery without angina pectoris: Secondary | ICD-10-CM | POA: Diagnosis not present

## 2021-04-10 DIAGNOSIS — Z5111 Encounter for antineoplastic chemotherapy: Secondary | ICD-10-CM | POA: Diagnosis not present

## 2021-04-10 DIAGNOSIS — C3411 Malignant neoplasm of upper lobe, right bronchus or lung: Secondary | ICD-10-CM | POA: Diagnosis not present

## 2021-04-10 DIAGNOSIS — Z510 Encounter for antineoplastic radiation therapy: Secondary | ICD-10-CM | POA: Diagnosis not present

## 2021-04-10 DIAGNOSIS — F17210 Nicotine dependence, cigarettes, uncomplicated: Secondary | ICD-10-CM | POA: Diagnosis not present

## 2021-04-10 DIAGNOSIS — N189 Chronic kidney disease, unspecified: Secondary | ICD-10-CM | POA: Diagnosis not present

## 2021-04-11 ENCOUNTER — Ambulatory Visit
Admission: RE | Admit: 2021-04-11 | Discharge: 2021-04-11 | Disposition: A | Discharge status: Home / Self Care | Payer: Medicare Other | Source: Ambulatory Visit | Attending: Radiation Oncology | Admitting: Radiation Oncology

## 2021-04-11 DIAGNOSIS — K4090 Unilateral inguinal hernia, without obstruction or gangrene, not specified as recurrent: Secondary | ICD-10-CM | POA: Diagnosis not present

## 2021-04-11 DIAGNOSIS — I2510 Atherosclerotic heart disease of native coronary artery without angina pectoris: Secondary | ICD-10-CM | POA: Diagnosis not present

## 2021-04-11 DIAGNOSIS — C3411 Malignant neoplasm of upper lobe, right bronchus or lung: Secondary | ICD-10-CM | POA: Diagnosis not present

## 2021-04-11 DIAGNOSIS — Z5111 Encounter for antineoplastic chemotherapy: Secondary | ICD-10-CM | POA: Diagnosis not present

## 2021-04-11 DIAGNOSIS — C329 Malignant neoplasm of larynx, unspecified: Secondary | ICD-10-CM | POA: Diagnosis not present

## 2021-04-11 DIAGNOSIS — E1122 Type 2 diabetes mellitus with diabetic chronic kidney disease: Secondary | ICD-10-CM | POA: Diagnosis not present

## 2021-04-11 DIAGNOSIS — Z510 Encounter for antineoplastic radiation therapy: Secondary | ICD-10-CM | POA: Diagnosis not present

## 2021-04-11 DIAGNOSIS — N189 Chronic kidney disease, unspecified: Secondary | ICD-10-CM | POA: Diagnosis not present

## 2021-04-11 DIAGNOSIS — F17210 Nicotine dependence, cigarettes, uncomplicated: Secondary | ICD-10-CM | POA: Diagnosis not present

## 2021-04-11 DIAGNOSIS — R590 Localized enlarged lymph nodes: Secondary | ICD-10-CM | POA: Diagnosis not present

## 2021-04-11 DIAGNOSIS — I130 Hypertensive heart and chronic kidney disease with heart failure and stage 1 through stage 4 chronic kidney disease, or unspecified chronic kidney disease: Secondary | ICD-10-CM | POA: Diagnosis not present

## 2021-04-12 ENCOUNTER — Ambulatory Visit
Admission: RE | Admit: 2021-04-12 | Discharge: 2021-04-12 | Disposition: A | Discharge status: Home / Self Care | Payer: Medicare Other | Source: Ambulatory Visit | Attending: Radiation Oncology | Admitting: Radiation Oncology

## 2021-04-12 DIAGNOSIS — E1122 Type 2 diabetes mellitus with diabetic chronic kidney disease: Secondary | ICD-10-CM | POA: Diagnosis not present

## 2021-04-12 DIAGNOSIS — R590 Localized enlarged lymph nodes: Secondary | ICD-10-CM | POA: Diagnosis not present

## 2021-04-12 DIAGNOSIS — Z5111 Encounter for antineoplastic chemotherapy: Secondary | ICD-10-CM | POA: Diagnosis not present

## 2021-04-12 DIAGNOSIS — F17210 Nicotine dependence, cigarettes, uncomplicated: Secondary | ICD-10-CM | POA: Diagnosis not present

## 2021-04-12 DIAGNOSIS — I130 Hypertensive heart and chronic kidney disease with heart failure and stage 1 through stage 4 chronic kidney disease, or unspecified chronic kidney disease: Secondary | ICD-10-CM | POA: Diagnosis not present

## 2021-04-12 DIAGNOSIS — Z510 Encounter for antineoplastic radiation therapy: Secondary | ICD-10-CM | POA: Diagnosis not present

## 2021-04-12 DIAGNOSIS — N189 Chronic kidney disease, unspecified: Secondary | ICD-10-CM | POA: Diagnosis not present

## 2021-04-12 DIAGNOSIS — I2510 Atherosclerotic heart disease of native coronary artery without angina pectoris: Secondary | ICD-10-CM | POA: Diagnosis not present

## 2021-04-12 DIAGNOSIS — C329 Malignant neoplasm of larynx, unspecified: Secondary | ICD-10-CM | POA: Diagnosis not present

## 2021-04-12 DIAGNOSIS — K4090 Unilateral inguinal hernia, without obstruction or gangrene, not specified as recurrent: Secondary | ICD-10-CM | POA: Diagnosis not present

## 2021-04-12 DIAGNOSIS — C3411 Malignant neoplasm of upper lobe, right bronchus or lung: Secondary | ICD-10-CM | POA: Diagnosis not present

## 2021-04-16 ENCOUNTER — Ambulatory Visit
Admission: RE | Admit: 2021-04-16 | Discharge: 2021-04-16 | Disposition: A | Discharge status: Home / Self Care | Payer: Medicare Other | Source: Ambulatory Visit | Attending: Radiation Oncology | Admitting: Radiation Oncology

## 2021-04-16 DIAGNOSIS — E1122 Type 2 diabetes mellitus with diabetic chronic kidney disease: Secondary | ICD-10-CM | POA: Diagnosis not present

## 2021-04-16 DIAGNOSIS — C3411 Malignant neoplasm of upper lobe, right bronchus or lung: Secondary | ICD-10-CM | POA: Diagnosis not present

## 2021-04-16 DIAGNOSIS — Z510 Encounter for antineoplastic radiation therapy: Secondary | ICD-10-CM | POA: Diagnosis not present

## 2021-04-16 DIAGNOSIS — I130 Hypertensive heart and chronic kidney disease with heart failure and stage 1 through stage 4 chronic kidney disease, or unspecified chronic kidney disease: Secondary | ICD-10-CM | POA: Diagnosis not present

## 2021-04-16 DIAGNOSIS — N189 Chronic kidney disease, unspecified: Secondary | ICD-10-CM | POA: Diagnosis not present

## 2021-04-16 DIAGNOSIS — K4090 Unilateral inguinal hernia, without obstruction or gangrene, not specified as recurrent: Secondary | ICD-10-CM | POA: Diagnosis not present

## 2021-04-16 DIAGNOSIS — F17210 Nicotine dependence, cigarettes, uncomplicated: Secondary | ICD-10-CM | POA: Diagnosis not present

## 2021-04-16 DIAGNOSIS — C329 Malignant neoplasm of larynx, unspecified: Secondary | ICD-10-CM | POA: Diagnosis not present

## 2021-04-16 DIAGNOSIS — R590 Localized enlarged lymph nodes: Secondary | ICD-10-CM | POA: Diagnosis not present

## 2021-04-16 DIAGNOSIS — Z5111 Encounter for antineoplastic chemotherapy: Secondary | ICD-10-CM | POA: Diagnosis not present

## 2021-04-16 DIAGNOSIS — I2510 Atherosclerotic heart disease of native coronary artery without angina pectoris: Secondary | ICD-10-CM | POA: Diagnosis not present

## 2021-04-17 ENCOUNTER — Ambulatory Visit
Admission: RE | Admit: 2021-04-17 | Discharge: 2021-04-17 | Disposition: A | Discharge status: Home / Self Care | Payer: Medicare Other | Source: Ambulatory Visit | Attending: Radiation Oncology | Admitting: Radiation Oncology

## 2021-04-17 DIAGNOSIS — E1122 Type 2 diabetes mellitus with diabetic chronic kidney disease: Secondary | ICD-10-CM | POA: Diagnosis not present

## 2021-04-17 DIAGNOSIS — I130 Hypertensive heart and chronic kidney disease with heart failure and stage 1 through stage 4 chronic kidney disease, or unspecified chronic kidney disease: Secondary | ICD-10-CM | POA: Diagnosis not present

## 2021-04-17 DIAGNOSIS — Z5111 Encounter for antineoplastic chemotherapy: Secondary | ICD-10-CM | POA: Diagnosis not present

## 2021-04-17 DIAGNOSIS — I2510 Atherosclerotic heart disease of native coronary artery without angina pectoris: Secondary | ICD-10-CM | POA: Diagnosis not present

## 2021-04-17 DIAGNOSIS — Z510 Encounter for antineoplastic radiation therapy: Secondary | ICD-10-CM | POA: Diagnosis not present

## 2021-04-17 DIAGNOSIS — N189 Chronic kidney disease, unspecified: Secondary | ICD-10-CM | POA: Diagnosis not present

## 2021-04-17 DIAGNOSIS — R590 Localized enlarged lymph nodes: Secondary | ICD-10-CM | POA: Diagnosis not present

## 2021-04-17 DIAGNOSIS — F17210 Nicotine dependence, cigarettes, uncomplicated: Secondary | ICD-10-CM | POA: Diagnosis not present

## 2021-04-17 DIAGNOSIS — C3411 Malignant neoplasm of upper lobe, right bronchus or lung: Secondary | ICD-10-CM | POA: Diagnosis not present

## 2021-04-17 DIAGNOSIS — K4090 Unilateral inguinal hernia, without obstruction or gangrene, not specified as recurrent: Secondary | ICD-10-CM | POA: Diagnosis not present

## 2021-04-17 DIAGNOSIS — C329 Malignant neoplasm of larynx, unspecified: Secondary | ICD-10-CM | POA: Diagnosis not present

## 2021-04-18 ENCOUNTER — Inpatient Hospital Stay (HOSPITAL_BASED_OUTPATIENT_CLINIC_OR_DEPARTMENT_OTHER): Payer: Medicare Other | Admitting: Oncology

## 2021-04-18 ENCOUNTER — Encounter: Payer: Self-pay | Admitting: Oncology

## 2021-04-18 ENCOUNTER — Ambulatory Visit
Admission: RE | Admit: 2021-04-18 | Discharge: 2021-04-18 | Disposition: A | Discharge status: Home / Self Care | Payer: Medicare Other | Source: Ambulatory Visit | Attending: Radiation Oncology | Admitting: Radiation Oncology

## 2021-04-18 ENCOUNTER — Other Ambulatory Visit: Payer: Self-pay

## 2021-04-18 ENCOUNTER — Emergency Department
Admission: EM | Admit: 2021-04-18 | Discharge: 2021-04-18 | Disposition: A | Discharge status: Home / Self Care | Payer: Medicare Other | Attending: Emergency Medicine | Admitting: Emergency Medicine

## 2021-04-18 ENCOUNTER — Inpatient Hospital Stay: Payer: Medicare Other

## 2021-04-18 VITALS — BP 111/71 | HR 73 | Temp 96.3°F | Wt 165.6 lb

## 2021-04-18 VITALS — BP 135/101 | HR 123 | Resp 18

## 2021-04-18 DIAGNOSIS — T886XXA Anaphylactic reaction due to adverse effect of correct drug or medicament properly administered, initial encounter: Secondary | ICD-10-CM | POA: Diagnosis not present

## 2021-04-18 DIAGNOSIS — I499 Cardiac arrhythmia, unspecified: Secondary | ICD-10-CM | POA: Diagnosis not present

## 2021-04-18 DIAGNOSIS — I130 Hypertensive heart and chronic kidney disease with heart failure and stage 1 through stage 4 chronic kidney disease, or unspecified chronic kidney disease: Secondary | ICD-10-CM | POA: Diagnosis not present

## 2021-04-18 DIAGNOSIS — C3411 Malignant neoplasm of upper lobe, right bronchus or lung: Secondary | ICD-10-CM | POA: Diagnosis not present

## 2021-04-18 DIAGNOSIS — Z85118 Personal history of other malignant neoplasm of bronchus and lung: Secondary | ICD-10-CM | POA: Diagnosis not present

## 2021-04-18 DIAGNOSIS — C329 Malignant neoplasm of larynx, unspecified: Secondary | ICD-10-CM | POA: Diagnosis not present

## 2021-04-18 DIAGNOSIS — R590 Localized enlarged lymph nodes: Secondary | ICD-10-CM | POA: Diagnosis not present

## 2021-04-18 DIAGNOSIS — F17210 Nicotine dependence, cigarettes, uncomplicated: Secondary | ICD-10-CM | POA: Insufficient documentation

## 2021-04-18 DIAGNOSIS — T7840XA Allergy, unspecified, initial encounter: Secondary | ICD-10-CM | POA: Diagnosis not present

## 2021-04-18 DIAGNOSIS — I2510 Atherosclerotic heart disease of native coronary artery without angina pectoris: Secondary | ICD-10-CM | POA: Diagnosis not present

## 2021-04-18 DIAGNOSIS — I5022 Chronic systolic (congestive) heart failure: Secondary | ICD-10-CM | POA: Insufficient documentation

## 2021-04-18 DIAGNOSIS — Y829 Unspecified medical devices associated with adverse incidents: Secondary | ICD-10-CM | POA: Insufficient documentation

## 2021-04-18 DIAGNOSIS — N289 Disorder of kidney and ureter, unspecified: Secondary | ICD-10-CM | POA: Diagnosis not present

## 2021-04-18 DIAGNOSIS — C3490 Malignant neoplasm of unspecified part of unspecified bronchus or lung: Secondary | ICD-10-CM | POA: Diagnosis not present

## 2021-04-18 DIAGNOSIS — R6889 Other general symptoms and signs: Secondary | ICD-10-CM | POA: Diagnosis not present

## 2021-04-18 DIAGNOSIS — N189 Chronic kidney disease, unspecified: Secondary | ICD-10-CM | POA: Diagnosis not present

## 2021-04-18 DIAGNOSIS — J449 Chronic obstructive pulmonary disease, unspecified: Secondary | ICD-10-CM | POA: Diagnosis not present

## 2021-04-18 DIAGNOSIS — K4090 Unilateral inguinal hernia, without obstruction or gangrene, not specified as recurrent: Secondary | ICD-10-CM | POA: Diagnosis not present

## 2021-04-18 DIAGNOSIS — Z7984 Long term (current) use of oral hypoglycemic drugs: Secondary | ICD-10-CM | POA: Insufficient documentation

## 2021-04-18 DIAGNOSIS — Z7982 Long term (current) use of aspirin: Secondary | ICD-10-CM | POA: Diagnosis not present

## 2021-04-18 DIAGNOSIS — Z8521 Personal history of malignant neoplasm of larynx: Secondary | ICD-10-CM | POA: Diagnosis not present

## 2021-04-18 DIAGNOSIS — Z510 Encounter for antineoplastic radiation therapy: Secondary | ICD-10-CM | POA: Diagnosis not present

## 2021-04-18 DIAGNOSIS — T782XXA Anaphylactic shock, unspecified, initial encounter: Secondary | ICD-10-CM

## 2021-04-18 DIAGNOSIS — N183 Chronic kidney disease, stage 3 unspecified: Secondary | ICD-10-CM | POA: Insufficient documentation

## 2021-04-18 DIAGNOSIS — C3401 Malignant neoplasm of right main bronchus: Secondary | ICD-10-CM

## 2021-04-18 DIAGNOSIS — Z5111 Encounter for antineoplastic chemotherapy: Secondary | ICD-10-CM

## 2021-04-18 DIAGNOSIS — E1122 Type 2 diabetes mellitus with diabetic chronic kidney disease: Secondary | ICD-10-CM | POA: Diagnosis not present

## 2021-04-18 LAB — CBC WITH DIFFERENTIAL/PLATELET
Abs Immature Granulocytes: 0.02 10*3/uL (ref 0.00–0.07)
Basophils Absolute: 0 10*3/uL (ref 0.0–0.1)
Basophils Relative: 0 %
Eosinophils Absolute: 0.4 10*3/uL (ref 0.0–0.5)
Eosinophils Relative: 7 %
HCT: 33.9 % — ABNORMAL LOW (ref 39.0–52.0)
Hemoglobin: 11.3 g/dL — ABNORMAL LOW (ref 13.0–17.0)
Immature Granulocytes: 0 %
Lymphocytes Relative: 15 %
Lymphs Abs: 1 10*3/uL (ref 0.7–4.0)
MCH: 31 pg (ref 26.0–34.0)
MCHC: 33.3 g/dL (ref 30.0–36.0)
MCV: 93.1 fL (ref 80.0–100.0)
Monocytes Absolute: 0.9 10*3/uL (ref 0.1–1.0)
Monocytes Relative: 15 %
Neutro Abs: 3.8 10*3/uL (ref 1.7–7.7)
Neutrophils Relative %: 63 %
Platelets: 211 10*3/uL (ref 150–400)
RBC: 3.64 MIL/uL — ABNORMAL LOW (ref 4.22–5.81)
RDW: 12.8 % (ref 11.5–15.5)
WBC: 6.2 10*3/uL (ref 4.0–10.5)
nRBC: 0 % (ref 0.0–0.2)

## 2021-04-18 LAB — COMPREHENSIVE METABOLIC PANEL
ALT: 39 U/L (ref 0–44)
AST: 17 U/L (ref 15–41)
Albumin: 3 g/dL — ABNORMAL LOW (ref 3.5–5.0)
Alkaline Phosphatase: 182 U/L — ABNORMAL HIGH (ref 38–126)
Anion gap: 8 (ref 5–15)
BUN: 16 mg/dL (ref 8–23)
CO2: 29 mmol/L (ref 22–32)
Calcium: 8.6 mg/dL — ABNORMAL LOW (ref 8.9–10.3)
Chloride: 99 mmol/L (ref 98–111)
Creatinine, Ser: 0.74 mg/dL (ref 0.61–1.24)
GFR, Estimated: >60 mL/min (ref >60)
Glucose, Bld: 104 mg/dL — ABNORMAL HIGH (ref 70–99)
Potassium: 4 mmol/L (ref 3.5–5.1)
Sodium: 136 mmol/L (ref 135–145)
Total Bilirubin: 0.4 mg/dL (ref 0.3–1.2)
Total Protein: 5.9 g/dL — ABNORMAL LOW (ref 6.5–8.1)

## 2021-04-18 MED ORDER — DIPHENHYDRAMINE HCL 50 MG/ML IJ SOLN
50.0000 mg | Freq: Once | INTRAMUSCULAR | Status: AC
  Administered 2021-04-18: 10:00:00 50 mg via INTRAVENOUS
  Filled 2021-04-18: qty 1

## 2021-04-18 MED ORDER — METHYLPREDNISOLONE SODIUM SUCC 125 MG IJ SOLR
125.0000 mg | Freq: Once | INTRAMUSCULAR | Status: AC | PRN
  Administered 2021-04-18: 11:00:00 125 mg via INTRAVENOUS

## 2021-04-18 MED ORDER — EPINEPHRINE 0.3 MG/0.3ML IJ SOAJ: 0.3000 mg | INTRAMUSCULAR | 0 refills | Status: AC | PRN

## 2021-04-18 MED ORDER — SODIUM CHLORIDE 0.9 % IV SOLN
45.0000 mg/m2 | Freq: Once | INTRAVENOUS | Status: AC
  Administered 2021-04-18: 11:00:00 84 mg via INTRAVENOUS
  Filled 2021-04-18: qty 14

## 2021-04-18 MED ORDER — FAMOTIDINE 20 MG IN NS 100 ML IVPB
20.0000 mg | Freq: Once | INTRAVENOUS | Status: AC | PRN
  Administered 2021-04-18: 11:00:00 20 mg via INTRAVENOUS
  Filled 2021-04-18: qty 100

## 2021-04-18 MED ORDER — EPINEPHRINE 0.3 MG/0.3ML IJ SOAJ
0.3000 mg | Freq: Once | INTRAMUSCULAR | Status: DC | PRN
  Administered 2021-04-18: 12:00:00 0.3 mg via INTRAMUSCULAR

## 2021-04-18 MED ORDER — SODIUM CHLORIDE 0.9 % IV SOLN
187.8000 mg | Freq: Once | INTRAVENOUS | Status: DC
  Filled 2021-04-18: qty 19

## 2021-04-18 MED ORDER — HEPARIN SOD (PORK) LOCK FLUSH 100 UNIT/ML IV SOLN
INTRAVENOUS | Status: AC
  Filled 2021-04-18: qty 5

## 2021-04-18 MED ORDER — PREDNISONE 50 MG PO TABS: ORAL_TABLET | ORAL | 0 refills | Status: DC

## 2021-04-18 MED ORDER — SODIUM CHLORIDE 0.9 % IV SOLN
10.0000 mg | Freq: Once | INTRAVENOUS | Status: AC
  Administered 2021-04-18: 11:00:00 10 mg via INTRAVENOUS
  Filled 2021-04-18: qty 1

## 2021-04-18 MED ORDER — PALONOSETRON HCL INJECTION 0.25 MG/5ML
0.2500 mg | Freq: Once | INTRAVENOUS | Status: AC
  Administered 2021-04-18: 10:00:00 0.25 mg via INTRAVENOUS
  Filled 2021-04-18: qty 5

## 2021-04-18 MED ORDER — SODIUM CHLORIDE 0.9% FLUSH
10.0000 mL | INTRAVENOUS | Status: DC | PRN
  Administered 2021-04-18: 09:00:00 10 mL via INTRAVENOUS
  Filled 2021-04-18: qty 10

## 2021-04-18 MED ORDER — DIPHENHYDRAMINE HCL 25 MG PO TABS
25.0000 mg | ORAL_TABLET | Freq: Four times a day (QID) | ORAL | 0 refills | Status: AC | PRN
Start: 2021-04-18 — End: ?

## 2021-04-18 MED ORDER — SODIUM CHLORIDE 0.9 % IV SOLN
Freq: Once | INTRAVENOUS | Status: DC | PRN
  Filled 2021-04-18: qty 250

## 2021-04-18 MED ORDER — FAMOTIDINE 20 MG IN NS 100 ML IVPB
20.0000 mg | Freq: Once | INTRAVENOUS | Status: AC
  Administered 2021-04-18: 12:00:00 20 mg via INTRAVENOUS
  Filled 2021-04-18: qty 100

## 2021-04-18 MED ORDER — DIPHENHYDRAMINE HCL 50 MG/ML IJ SOLN
25.0000 mg | Freq: Once | INTRAMUSCULAR | Status: AC
  Administered 2021-04-18: 11:00:00 25 mg via INTRAVENOUS

## 2021-04-18 MED ORDER — FAMOTIDINE 20 MG IN NS 100 ML IVPB
20.0000 mg | Freq: Once | INTRAVENOUS | Status: AC
  Administered 2021-04-18: 10:00:00 20 mg via INTRAVENOUS
  Filled 2021-04-18: qty 2

## 2021-04-18 MED ORDER — SODIUM CHLORIDE 0.9 % IV SOLN
Freq: Once | INTRAVENOUS | Status: AC
  Filled 2021-04-18: qty 250

## 2021-04-18 MED ORDER — DIPHENHYDRAMINE HCL 50 MG/ML IJ SOLN
50.0000 mg | Freq: Once | INTRAMUSCULAR | Status: AC | PRN
  Administered 2021-04-18: 12:00:00 25 mg via INTRAVENOUS

## 2021-04-18 MED ORDER — HEPARIN SOD (PORK) LOCK FLUSH 100 UNIT/ML IV SOLN
500.0000 [IU] | Freq: Once | INTRAVENOUS | Status: DC
  Filled 2021-04-18: qty 5

## 2021-04-18 NOTE — Discharge Instructions (Addendum)
You had a severe allergic reaction.  If you have any recurrent symptoms, please administer the EpiPen and call 911 to return to emergency department.  You can take the prednisone once daily for the next 5 days to prevent recurrent symptoms.  You can take Benadryl as needed.  Please follow-up with your oncologist as scheduled.

## 2021-04-18 NOTE — Progress Notes (Signed)
Hypersensitivity Reaction note  Date of event: 04/18/21 Time of event: 1121 Generic name of drug involved: Taxol 84mg  Name of provider notified of the hypersensitivity reaction: Beckey Rutter, NP & Dr Tasia Catchings Was agent that likely caused hypersensitivity reaction added to Allergies List within EMR? Yes  See previous note Extreme SOB, labored breathing, decrease O2 >emergency medications & O2 given > transported to the ED  Jorene Minors, RN 04/18/2021 12:01 PM

## 2021-04-18 NOTE — ED Notes (Signed)
Pt resting comfortably in bed, NAD. Pt reports feeling much better now. No needs verbalized at this time. Bed low & locked; call light & personal items within reach.

## 2021-04-18 NOTE — Progress Notes (Signed)
Hematology/Oncology Follow up note [phone] [phone]   Patient Care Team: Venita Lick, NP as PCP - General (Nurse Practitioner) Wellington Hampshire, MD as PCP - Cardiology (Cardiology) Beverly Gust, MD (Otolaryngology) Telford Nab, RN as Registered Nurse [name], MD as Radiation Oncologist (Radiation Oncology)  REFERRING PROVIDER: Venita Lick, NP REASON FOR VISIT Follow up for stage III squamous cell carcinoma.  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  He got definitive radiation to the larynx, and the patient has been doing annual checkup with ENT Dr. Tami Ribas.  # 09/02/2017 ENB biopsy of both lung nodule. Pathology of biopsies showed no lesional cells identified. However he is a high risk patient and negative biopsy results do not exclude malignancy.  May 2019 Patient underwent SBRT to the left lung lesion.  # 02/25/2019 CT scan showed right upper lobe nodule interval increase in size.  #03/2019 he finished SBRT to right upper lobe lesion.   01/24/2021, CT with contrast showed progressive right upper lobe malignancy, while separated from the site of regional right upper lobe lesion.  2 masslike areas, including the more central of these which directly invades the mediastinum, with potential direct tracheal invasion.  Progressive right hilar and subcarinal lymphadenopathy.  Left lower lobe at the site of previously noted left lower lobe lesion, there has been progressive septal thickening and nodularity which has slowly increased over numerous prior examinations.  Highly concerning for locally recurrent disease.  Aortic atherosclerosis, in addition to  three-vessel coronary artery disease.  Calcification of aortic valve.  1.7 cm lesion in the posterior aspect of the interpolar region of the right kidney.  Suspicious for small neoplasm.  02/11/2021, PET scan showed hypermetabolic central right upper lobe/perihilar recurrence with adjacent contiguous right paratracheal adenopathy.  No distal or progressive metastasis identified.  Low-level hypermetabolic activity in the right groin associated with it.  Postsurgical changes.  Coronary and aortic atherosclerosis.  #03/06/2021 status post biopsy via bronchoscopy. Pathology showed right mainstem bronchus positive for squamous cell carcinoma. Right mainstem bronchus brushings positive for SCC, lavage was suspicious for malignancy. Subcarinal lymph node negative.  precarinal lymph node negative.  03/26/2021 MRI brain with and without contrast showed age-related atrophy.  Minimal chronic small vessel ischemic changes.  No evidence of metastatic disease.  Incidental tiny cortical infarction.   INTERVAL HISTORY Philip Wood is a 75 y.o. male who has above history reviewed by me today presents for follow-up visit for management of recurrent stage III squamous cell carcinoma Patient is currently on concurrent chemoradiation.  He tolerated cycle 1 weekly carboplatin and Taxol.  Denies any nausea vomiting diarrhea.  Chronic shortness of breath at baseline.   Review of Systems  Constitutional:  Negative for appetite change, chills, fatigue, fever and unexpected weight change.  HENT:   Negative for hearing loss and voice change.   Eyes:  Negative for eye problems and icterus.  Respiratory:  Positive for shortness of breath. Negative for chest tightness and cough.   Cardiovascular:  Negative for chest pain and leg swelling.  Gastrointestinal:  Negative for abdominal distention and abdominal pain.  Endocrine: Negative for hot flashes.  Genitourinary:  Negative for difficulty urinating, dysuria and  frequency.   Musculoskeletal:  Negative for arthralgias.  Skin:  Negative for itching and rash.  Neurological:  Negative for light-headedness and numbness.  Hematological:  Negative for adenopathy. Does not bruise/bleed easily.  Psychiatric/Behavioral:  Negative for confusion.      MEDICAL HISTORY:  Past Medical History:  Diagnosis Date   Arthritis    Bruit of left carotid artery    Cancer ([place])    laryngeal   Chronic heart failure (HCC)    Chronic kidney disease    COPD (chronic obstructive pulmonary disease) (HCC)    Coronary artery disease    Diabetes mellitus without complication (HCC)    GERD (gastroesophageal reflux disease)    Headache    History of kidney stones    Hyperlipidemia    Hypertension    Lumbago    Neuropathy    Tobacco abuse disorder     SURGICAL HISTORY: Past Surgical History:  Procedure Laterality Date   ARTERY BIOPSY Right 11/14/2020   Procedure: BIOPSY TEMPORAL ARTERY;  Surgeon: Katha Cabal, MD;  Location: [facility];  Service: Vascular;  Laterality: Right;   CAROTID PTA/STENT INTERVENTION Left 01/30/2021   Procedure: CAROTID PTA/STENT INTERVENTION;  Surgeon: Katha Cabal, MD;  Location: [facility];  Service: Cardiovascular;  Laterality: Left;   CHOLECYSTECTOMY     ELECTROMAGNETIC NAVIGATION BROCHOSCOPY N/A 08/19/2017   Procedure: ELECTROMAGNETIC NAVIGATION BRONCHOSCOPY;  Surgeon: Flora Lipps, MD;  Location: [facility];  Service: Cardiopulmonary;  Laterality: N/A;   ELECTROMAGNETIC NAVIGATION BROCHOSCOPY Left 09/02/2017   Procedure: ELECTROMAGNETIC NAVIGATION BRONCHOSCOPY;  Surgeon: Flora Lipps, MD;  Location: [facility];  Service: Cardiopulmonary;  Laterality: Left;   IR IMAGING GUIDED PORT INSERTION  03/29/2021   THROAT SURGERY     VIDEO BRONCHOSCOPY WITH ENDOBRONCHIAL ULTRASOUND N/A 03/06/2021   Procedure: VIDEO BRONCHOSCOPY WITH ENDOBRONCHIAL ULTRASOUND;  Surgeon: Tyler Pita, MD;  Location: [facility];  Service:  Cardiopulmonary;  Laterality: N/A;    SOCIAL HISTORY: Social History   Socioeconomic History   Marital status: Widowed    Spouse name: Not on file   Number of children: Not on file   Years of education: Not on file   Highest education level: 10th grade  Occupational History   Not on file  Tobacco Use   Smoking status: Light Smoker    Packs/day: 0.50    Years: 53.00    Pack years: 26.50    Types: Cigarettes   Smokeless tobacco: Never   Tobacco comments:    Aprrox 10 cigs/day--02/21/2021  Vaping Use   Vaping Use: Never used  Substance and Sexual Activity   Alcohol use: No    Alcohol/week: 0.0 standard drinks   Drug use: No   Sexual activity: Not Currently  Other Topics Concern   Not on file  Social History Narrative   Works part time at [employer] retired   Investment banker, operational of Radio broadcast assistant Strain: Low Risk    Difficulty of Paying Living Expenses: Not hard at all  Food Insecurity: No Food Insecurity   Worried About Charity fundraiser in the Last Year: Never true   Arboriculturist in the Last Year: Never true  Transportation Needs: No Transportation Needs   Lack of Transportation (Medical): No   Lack of Transportation (Non-Medical): No  Physical Activity: Inactive   Days of Exercise per Week: 0 days   Minutes of Exercise per Session: 0 min  Stress: No Stress Concern Present   Feeling of Stress :  Not at all  Social Connections: Not on file  Intimate Partner Violence: Not on file    FAMILY HISTORY: Family History  Problem Relation Age of Onset   Heart disease Father    Heart attack Father    Brain cancer Sister    Cervical cancer Sister    Stomach cancer Sister    Stomach cancer Sister    Diabetes Sister    Lupus Sister    Rectal cancer Brother    Lung cancer Brother    Lung cancer Brother    Lung cancer Brother    Lung cancer Brother     ALLERGIES:  is allergic to paclitaxel.  MEDICATIONS:  Current Outpatient Medications  Medication  Sig Dispense Refill   acetaminophen (TYLENOL) 650 MG CR tablet Take 1,300 mg by mouth every 8 (eight) hours as needed for pain.     albuterol (VENTOLIN HFA) 108 (90 Base) MCG/ACT inhaler Inhale 2 puffs into the lungs every 6 (six) hours as needed for wheezing or shortness of breath. 8 g 2   allopurinol (ZYLOPRIM) 100 MG tablet Take 1 tablet (100 mg total) by mouth daily. (Patient taking differently: Take 100 mg by mouth in the morning.) 90 tablet 4   aspirin EC 81 MG tablet Take 81 mg by mouth in the morning. Swallow whole.     atorvastatin (LIPITOR) 80 MG tablet Take 1 tablet (80 mg total) by mouth daily at 6 PM. 90 tablet 4   clopidogrel (PLAVIX) 75 MG tablet Take 1 tablet (75 mg total) by mouth daily. 30 tablet 11   Ensure (ENSURE) Take 237 mLs by mouth daily.     lidocaine-prilocaine (EMLA) cream Apply to affected area once 30 g 3   metFORMIN (GLUCOPHAGE) 500 MG tablet Take 1 tablet (500 mg total) by mouth 2 (two) times daily with a meal. 180 tablet 4   metoprolol succinate (TOPROL XL) 25 MG 24 hr tablet Take 1 tablet (25 mg total) by mouth daily. (Patient taking differently: Take 25 mg by mouth in the morning.) 90 tablet 4   mirtazapine (REMERON) 7.5 MG tablet Take 1 tablet (7.5 mg total) by mouth at bedtime. 90 tablet 4   prochlorperazine (COMPAZINE) 10 MG tablet Take 1 tablet (10 mg total) by mouth every 6 (six) hours as needed (Nausea or vomiting). 30 tablet 1   ramipril (ALTACE) 10 MG capsule Take 1 capsule (10 mg total) by mouth daily. (Patient taking differently: Take 10 mg by mouth in the morning.) 90 capsule 4   tiotropium (SPIRIVA HANDIHALER) 18 MCG inhalation capsule PLACE 1 CAPSULE INTO INHALER AND INHALE ITS CONTENTS ONCE DAILY. (Patient taking differently: Place 18 mcg into inhaler and inhale in the morning.) 90 capsule 4   vitamin B-12 (CYANOCOBALAMIN) 1000 MCG tablet Take 1,000 mcg by mouth daily.     diphenhydrAMINE (BENADRYL ALLERGY) 25 MG tablet Take 1 tablet (25 mg total) by  mouth every 6 (six) hours as needed. 30 tablet 0   EPINEPHrine 0.3 mg/0.3 mL IJ SOAJ injection Inject 0.3 mg into the muscle as needed for up to 1 day for anaphylaxis. 1 each 0   predniSONE (DELTASONE) 50 MG tablet Take once daily for the next 5 days 5 tablet 0   No current facility-administered medications for this visit.     PHYSICAL EXAMINATION: ECOG PERFORMANCE STATUS: 1 - Symptomatic but completely ambulatory Vitals:   04/18/21 0915  BP: 111/71  Pulse: 73  Temp: (!) 96.3 F (35.7 C)   Filed Weights  04/18/21 0915  Weight: 165 lb 9.6 oz (75.1 kg)  Physical Exam Constitutional:      General: He is not in acute distress.    Appearance: He is not diaphoretic.  HENT:     Head: Normocephalic and atraumatic.     Nose: Nose normal.     Mouth/Throat:     Pharynx: No oropharyngeal exudate.  Eyes:     General: No scleral icterus.    Pupils: Pupils are equal, round, and reactive to light.  Cardiovascular:     Rate and Rhythm: Normal rate and regular rhythm.     Heart sounds: No murmur heard. Pulmonary:     Effort: Pulmonary effort is normal. No respiratory distress.     Breath sounds: No rales.     Comments: Decreased breath sound bilaterally Chest:     Chest wall: No tenderness.  Abdominal:     General: There is no distension.     Palpations: Abdomen is soft.     Tenderness: There is no abdominal tenderness.  Musculoskeletal:        General: Normal range of motion.     Cervical back: Normal range of motion and neck supple.  Skin:    General: Skin is warm and dry.     Findings: No erythema.  Neurological:     Mental Status: He is alert and oriented to person, place, and time.     Cranial Nerves: No cranial nerve deficit.     Motor: No abnormal muscle tone.     Coordination: Coordination normal.  Psychiatric:        Mood and Affect: Affect normal.          LABORATORY DATA:  I have reviewed the data as listed Lab Results  Component Value Date   WBC 6.2  04/18/2021   HGB 11.3 (L) 04/18/2021   HCT 33.9 (L) 04/18/2021   MCV 93.1 04/18/2021   PLT 211 04/18/2021   Recent Labs    05/31/20 0904 07/30/20 0759 11/28/20 0904 01/24/21 1010 01/31/21 0738 04/08/21 0902 04/18/21 0843  NA 141   < > 142  --  137 137 136  K 4.8   < > 5.0  --  4.4 4.0 4.0  CL 101   < > 103  --  104 105 99  CO2 26   < > 25  --  '28 24 29  ' GLUCOSE 96   < > 103*  --  109* 98 104*  BUN 16   < > 13   < > '17 15 16  ' CREATININE 1.15   < > 0.95   < > 0.85 0.75 0.74  CALCIUM 9.0   < > 9.5  --  8.5* 8.7* 8.6*  GFRNONAA 62   < >  --    < > >60 >60 >60  GFRAA 72  --   --   --   --   --   --   PROT  --    < > 6.1  --   --  6.1* 5.9*  ALBUMIN  --    < > 4.0  --   --  3.2* 3.0*  AST  --    < > 6  --   --  12* 17  ALT  --    < > 10  --   --  11 39  ALKPHOS  --    < > 126*  --   --  82 182*  BILITOT  --    < >  0.3  --   --  0.5 0.4   < > = values in this interval not displayed.    RADIOGRAPHIC STUDIES: I have personally reviewed the radiological images as listed and agreed with the findings in the report. CT Chest W Contrast  Addendum Date: 01/26/2021   ADDENDUM REPORT: 01/26/2021 12:24 ADDENDUM: As mentioned in the body of the report there is a 1.7 cm lesion in the posterior aspect of the interpolar region of the right kidney, suspicious for small neoplasm, either a renal cell carcinoma or metastatic lesion of the kidney. This could be further evaluated with nonemergent abdominal MRI with and without IV gadolinium if clinically appropriate. Electronically Signed   By: Vinnie Langton M.D.   On: 01/26/2021 12:24   Result Date: 01/26/2021 CLINICAL DATA:  75 year old male with history of non-small cell lung cancer treated with radiation therapy presenting with 1 week of hemoptysis and cough. Follow-up study. EXAM: CT CHEST WITH CONTRAST TECHNIQUE: Multidetector CT imaging of the chest was performed during intravenous contrast administration. CONTRAST:  28m OMNIPAQUE IOHEXOL 350  MG/ML SOLN COMPARISON:  CT of the chest 07/30/2020. FINDINGS: Cardiovascular: Heart size is normal. There is no significant pericardial fluid, thickening or pericardial calcification. There is aortic atherosclerosis, as well as atherosclerosis of the great vessels of the mediastinum and the coronary arteries, including calcified atherosclerotic plaque in the left anterior descending, left circumflex and right coronary arteries. Thickening calcification of the aortic valve. Mild calcification of the mitral annulus. Mediastinum/Nodes: Enlarging subcarinal (axial image 72 of series 2 measuring 1.7 cm in short axis) and right hilar (axial image 71 of series 2) measuring 1.7 cm) lymph nodes, highly concerning for metastatic disease. No left hilar lymphadenopathy. Esophagus is unremarkable in appearance. No axillary lymphadenopathy. Lungs/Pleura: Definitive enlargement of a mass-like area with convex margins in the right upper lobe (axial image 50 of series 2 and coronal image 85 of series 4) which currently measures 2.0 x 4.3 x 2.5 cm, compatible with progressive right upper lobe neoplasm. In association with this there is a more central mass in the right upper lobe (axial image 59 of series 2 and coronal image 79 of series 4) measuring 5.0 x 4.0 x 2.3 cm which directly invades the mediastinum and is now completely obscuring the fat plane between the lesion and the adjacent distal trachea and proximal right mainstem bronchus. There is a contour abnormality in the region of the distal trachea best appreciated on coronal images 80-82 of series 4, such that early direct invasion into the trachea is not excluded. Slight progression of area of septal thickening and nodularity in the left lower lobe, best appreciated on axial image 115 of series 3 where the bulkiest portion of this region measures approximately 2.1 x 2.1 cm. No acute consolidative airspace disease. No pleural effusions. Diffuse bronchial wall thickening with  mild centrilobular and paraseptal emphysema. Upper Abdomen: Status post cholecystectomy. Mild intra and extrahepatic biliary ductal dilatation, similar to prior studies, presumably reflective of benign post cholecystectomy physiology. Aortic atherosclerosis. Severe atrophy of the right kidney with multiple small lesions, most concerning of which is a 1.7 cm intermediate attenuation (43 HU) lesion extending off the posterior aspect of the interpolar region (axial image 179 of series 2), new compared to prior PET-CT. Musculoskeletal: There are no aggressive appearing lytic or blastic lesions noted in the visualized portions of the skeleton. IMPRESSION: 1. Progressive right upper lobe malignancy. Notably, this appears well separated from the site of the regional right upper  lobe lesion which was in the anterior aspect of the right upper lobe. On today's study, there are 2 mass-like areas, including the more central of these which directly invades the mediastinum, with potential direct tracheal invasion, as well as progressive right hilar and subcarinal lymphadenopathy. 2. In addition, in the left lower lobe at the site of the previously noted left lower lobe lesion there has been progressive septal thickening and nodularity which has slowly increased over numerous prior examinations. Findings are highly concerning for locally recurrent disease. 3. Aortic atherosclerosis, in addition to 3 vessel coronary artery disease. Assessment for potential risk factor modification, dietary therapy or pharmacologic therapy may be warranted, if clinically indicated. 4. There are calcifications of the aortic valve. Echocardiographic correlation for evaluation of potential valvular dysfunction may be warranted if clinically indicated. These results will be called to the ordering clinician or representative by the Radiologist Assistant, and communication documented in the PACS or [employer]. Aortic Atherosclerosis (ICD10-I70.0)  and Emphysema (ICD10-J43.9). Electronically Signed: By: Vinnie Langton M.D. On: 01/26/2021 10:07   MR Brain W Wo Contrast  Result Date: 03/26/2021 CLINICAL DATA:  Squamous cell carcinoma of the right lung.  Staging. EXAM: MRI HEAD WITHOUT AND WITH CONTRAST TECHNIQUE: Multiplanar, multiecho pulse sequences of the brain and surrounding structures were obtained without and with intravenous contrast. CONTRAST:  7.70m GADAVIST GADOBUTROL 1 MMOL/ML IV SOLN COMPARISON:  10/16/2020 FINDINGS: Brain: Punctate focus of restricted diffusion at the right parietal vertex, diffusion imaging 40 and FLAIR image 46. This was not present in June, and is most consistent with a punctate cortical infarction. No abnormal enhancement occurs in this location. Elsewhere, the brain shows generalized atrophy with a few old small vessel insults of the deep white matter. No sign of mass lesion, hemorrhage, hydrocephalus or extra-axial collection. Vascular: Major vessels at the base of the brain show flow. Skull and upper cervical spine: Negative Sinuses/Orbits: Clear/normal Other: None IMPRESSION: Age related atrophy. Minimal chronic small-vessel ischemic change of the white matter. No evidence of metastatic disease. Punctate focus of restricted diffusion and T2/FLAIR signal at the right parietal vertex as described above, most consistent with an incidental tiny cortical infarction. Tiny nonenhancing metastasis is not excluded but would be less likely. Electronically Signed   By: MNelson ChimesM.D.   On: 03/26/2021 10:42   PERIPHERAL VASCULAR CATHETERIZATION  Result Date: 01/30/2021 See surgical note for result.  NM PET Image Restag (PS) Skull Base To Thigh  Result Date: 02/13/2021 CLINICAL DATA:  Subsequent treatment strategy for non-small cell lung cancer. Previous radiation therapy. Findings suspicious for local recurrence on recent chest CT EXAM: NUCLEAR MEDICINE PET SKULL BASE TO THIGH TECHNIQUE: 9.4 mCi F-18 FDG was injected  intravenously. Full-ring PET imaging was performed from the skull base to thigh after the radiotracer. CT data was obtained and used for attenuation correction and anatomic localization. Fasting blood glucose: 111 mg/dl COMPARISON:  CT chest 01/24/2021 and 07/30/2020.  PET-CT 03/10/2019. FINDINGS: Mediastinal blood pool activity: SUV max 1.5 NECK: No hypermetabolic cervical lymph nodes are identified.There are no lesions of the pharyngeal mucosal space. Incidental CT findings: Left carotid stent noted. CHEST: The recurrent right suprahilar mass demonstrates peripheral hypermetabolic activity which is greatest medially (SUV max 7.6), consistent with local recurrence. This measures approximately 3.4 x 3.2 cm on image 94/3. There is contiguous right paratracheal hypermetabolic adenopathy. The mildly prominent subcarinal node seen on the recent CT demonstrates no hypermetabolic activity. The superior extent of the pulmonary component demonstrates no significant metabolic  activity. There is no peripheral hypermetabolic pulmonary activity. Specifically, no hypermetabolic activity is demonstrated associated with the increased nodular septal thickening in the left lower lobe. Incidental CT findings: Atherosclerosis of the aorta, great vessels and coronary arteries. No significant pleural or pericardial effusion. Radiation changes in the central right upper lobe with left lower lobe scarring. No suspicious peripheral pulmonary nodules. ABDOMEN/PELVIS: There is no hypermetabolic activity within the liver, adrenal glands, spleen or pancreas. There is no hypermetabolic nodal activity. There are apparent postsurgical changes in the right inguinal region with associated low level hypermetabolic activity (SUV max 2.7). Incidental CT findings: Severe right renal atrophy with cortical thinning. Diffuse aortic and branch vessel atherosclerosis, prior cholecystectomy, probable left periaortic lymphangioma (2.0 cm on image 173/3),  periampullary duodenal diverticulum and moderate enlargement of the prostate gland are again noted. There is a right inguinal hernia containing fat and an associated small right scrotal hydrocele. SKELETON: There is no hypermetabolic activity to suggest osseous metastatic disease. Incidental CT findings: none IMPRESSION: 1. Hypermetabolic central right upper lobe/perihilar recurrence with adjacent contiguous right paratracheal adenopathy. 2. No distant or progressive metastases identified. No suspicious peripheral lung hypermetabolic activity. 3. Low level hypermetabolic activity in the right groin associated with apparent postsurgical changes, presumably from recent carotid stent placement. 4. Coronary and Aortic Atherosclerosis (ICD10-I70.0). Electronically Signed   By: Richardean Sale M.D.   On: 02/13/2021 09:39   DG Chest Port 1 View  Result Date: 03/06/2021 CLINICAL DATA:  Status post bronchoscopy EXAM: PORTABLE CHEST 1 VIEW COMPARISON:  Chest radiograph from 07/17/2017, CT chest 01/24/2021 FINDINGS: The cardiomediastinal silhouette is normal. Is peculiar did mass is seen in the right suprahilar region with surrounding architectural distortion as seen on the recent CT chest. There is no evidence of pneumothorax following bronchoscopy. The lungs are otherwise clear. There is no pleural effusion. There is no acute osseous abnormality. IMPRESSION: 1. No evidence of pneumothorax following bronchoscopy. 2. Right upper lobe mass as seen on prior chest CT. Electronically Signed   By: Valetta Mole M.D.   On: 03/06/2021 14:28   IR IMAGING GUIDED PORT INSERTION  Result Date: 03/29/2021 INDICATION: RIGHT lung cancer. EXAM: IMPLANTED PORT A CATH PLACEMENT WITH ULTRASOUND AND FLUOROSCOPIC GUIDANCE MEDICATIONS: None ANESTHESIA/SEDATION: Moderate (conscious) sedation was employed during this procedure. A total of Versed 1 mg and Fentanyl 50 mcg was administered intravenously. Moderate Sedation Time: 26 minutes. The  patient's level of consciousness and vital signs were monitored continuously by radiology nursing throughout the procedure under my direct supervision. FLUOROSCOPY TIME:  0 minutes, 12 seconds (0.9 mGy) COMPLICATIONS: None immediate. PROCEDURE: The procedure, risks, benefits, and alternatives were explained to the patient. Questions regarding the procedure were encouraged and answered. The patient understands and consents to the procedure. The LEFT neck and chest were prepped with chlorhexidine in a sterile fashion, and a sterile drape was applied covering the operative field. Maximum barrier sterile technique with sterile gowns and gloves were used for the procedure. A timeout was performed prior to the initiation of the procedure. Local anesthesia was provided with 1% lidocaine with epinephrine. After creating a small venotomy incision, a micropuncture kit was utilized to access the internal jugular vein under direct, real-time ultrasound guidance. Ultrasound image documentation was performed. The microwire was kinked to measure appropriate catheter length. A subcutaneous port pocket was then created along the upper chest wall utilizing a combination of sharp and blunt dissection. The pocket was irrigated with sterile saline. A single lumen ISP power injectable port was  chosen for placement. The 8 Fr catheter was tunneled from the port pocket site to the venotomy incision. The port was placed in the pocket. The external catheter was trimmed to appropriate length. At the venotomy, an 8 Fr peel-away sheath was placed over a guidewire under fluoroscopic guidance. The catheter was then placed through the sheath and the sheath was removed. Final catheter positioning was confirmed and documented with a fluoroscopic spot radiograph. The port was accessed with a Huber needle, aspirated and flushed with heparinized saline. The port pocket incision was closed with interrupted 3-0 Vicryl suture then Dermabond was applied,  including at the venotomy incision. Dressings were placed. The patient tolerated the procedure well without immediate post procedural complication. IMPRESSION: Successful placement of a LEFT internal jugular approach power injectable Port-A-Cath. The tip of the catheter is located at the proximal RIGHT atrium. The catheter is ready for immediate use. Michaelle Birks, MD [facility] Radiology Electronically Signed   By: Michaelle Birks M.D.   On: 03/29/2021 11:58      ASSESSMENT & PLAN:  1. Encounter for antineoplastic chemotherapy   2. Malignant neoplasm of bronchus and lung ([place])   3. Squamous cell carcinoma of larynx (HCC)   4. Kidney lesion   5. Anaphylaxis, initial encounter    Cancer Staging  Lung cancer [facility]) Staging form: Lung, AJCC 8th Edition - Clinical: Stage IIIA (rcT4, cN1, cM0) - Signed by Earlie Server, MD on 02/13/2021  #Presumed lung cancer, status post SBRT of left lung nodule in 2019 and SBRP of right upper lobe nodule . 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  Labs reviewed and discussed with patient .  Proceed with cycle 2 weekly carboplatin AUC of 2 and Taxol 45 mg/m2  #Grade 4 infusion reaction to Taxol. Patient developed infusion reaction with shortness of breath, desaturation to 80s on room air, respiratory distress 7 minutes after receiving cycle 2 Taxol. patient received Solu-Medrol 125 mg x 2, Benadryl 50 mg IV x2, Pepcid 20 mg x2, normal saline bolus, oxygen I was called to assess patient at infusion center. Despite these patient remains in tripod position, tachypnea, tachycardia.  Patient was placed on 10 L nonrebreather, epinephrine 0.3 mg was given using EpiPen.  EMS was called and the patient was transported to ER.  ER triage nurse was called by NP. Patient was monitored in emergency room and symptoms improved.  He was observed in the emergency room for 4 hours without any recurrent symptoms.  Patient was discharged  with an EpiPen, prednisone and Benadryl.  Return precautions were discussed by ED physician. I called patient's daughter Rise Paganini and updated her. Taxol is discontinued.  Plan concurrent carboplatin with radiation treatments.   # Squamous cell carcinoma of larynx, recommend he continue to have annual check up with ENT Dr.McQueen.  #Right kidney lesion, not well characterized on PET scan.   will obtain MRI renal protocol for further evaluation of the kidney lesion once he finishes treatment for lung cancer..  We spent sufficient time to discuss many aspect of care, questions were answered to patient's satisfaction. Follow-up in 1 week for lab MD.  Carboplatin Earlie Server, MD, PhD  04/18/2021

## 2021-04-18 NOTE — ED Triage Notes (Signed)
Pt BIB EMS from cancer center for allergic rxn to Taxol (chest tightness, SHOB, wheezing). Pt received the following meds at the [facility] center (port accessed by [facility] center):  Benadryl 50mg  IV Solumedrol 125mg  IVP Pepcid Solumedrol 125mg  again Benadryl 25mg  IV NS bolus Pepcid again  Pt was placed on NRB mask at 10L/min. Pt 99-100% on RA on arrival to ER. Pt reports feeling better than before.

## 2021-04-18 NOTE — Patient Instructions (Addendum)
[facility]  Discharge Instructions: ?Thank you for choosing [facility] to provide your oncology and hematology care.  ?If you have a lab appointment with the Cancer Center, please go directly to the Cancer Center and check in at the registration area. ? ?Wear comfortable clothing and clothing appropriate for easy access to any Portacath or PICC line.  ? ?We strive to give you quality time with your provider. You may need to reschedule your appointment if you arrive late (15 or more minutes).  Arriving late affects you and other patients whose appointments are after yours.  Also, if you miss three or more appointments without notifying the office, you may be dismissed from the clinic at the provider?s discretion.    ?  ?For prescription refill requests, have your pharmacy contact our office and allow 72 hours for refills to be completed.   ? ?  ?To help prevent nausea and vomiting after your treatment, we encourage you to take your nausea medication as directed. ? ?BELOW ARE SYMPTOMS THAT SHOULD BE REPORTED IMMEDIATELY: ?*FEVER GREATER THAN 100.4 F (38 ?C) OR HIGHER ?*CHILLS OR SWEATING ?*NAUSEA AND VOMITING THAT IS NOT CONTROLLED WITH YOUR NAUSEA MEDICATION ?*UNUSUAL SHORTNESS OF BREATH ?*UNUSUAL BRUISING OR BLEEDING ?*URINARY PROBLEMS (pain or burning when urinating, or frequent urination) ?*BOWEL PROBLEMS (unusual diarrhea, constipation, pain near the anus) ?TENDERNESS IN MOUTH AND THROAT WITH OR WITHOUT PRESENCE OF ULCERS (sore throat, sores in mouth, or a toothache) ?UNUSUAL RASH, SWELLING OR PAIN  ?UNUSUAL VAGINAL DISCHARGE OR ITCHING  ? ?Items with * indicate a potential emergency and should be followed up as soon as possible or go to the Emergency Department if any problems should occur. ? ?Please show the CHEMOTHERAPY ALERT CARD or IMMUNOTHERAPY ALERT CARD at check-in to the Emergency Department and triage nurse. ? ?Should you have questions after your visit  or need to cancel or reschedule your appointment, please contact [facility]  [phone] and follow the prompts.  Office hours are 8:00 a.m. to 4:30 p.m. Monday - Friday. Please note that voicemails left after 4:00 p.m. may not be returned until the following business day.  We are closed weekends and major holidays. You have access to a nurse at all times for urgent questions. Please call the main number to the clinic [phone] and follow the prompts. ? ?For any non-urgent questions, you may also contact your provider using MyChart. We now offer e-Visits for anyone 18 and older to request care online for non-urgent symptoms. For details visit [email or web address]. ?  ?Also download the MyChart app! Go to the app store, search "MyChart", open the app, select [facility], and log in with your MyChart username and password. ? ?Due to Covid, a mask is required upon entering the hospital/clinic. If you do not have a mask, one will be given to you upon arrival. For doctor visits, patients may have 1 support person aged 18 or older with them. For treatment visits, patients cannot have anyone with them due to current Covid guidelines and our immunocompromised population.  ?

## 2021-04-18 NOTE — ED Provider Notes (Signed)
[facility]  ____________________________________________   Event Date/Time   First MD Initiated Contact with Patient 04/18/21 1226     (approximate)  I have reviewed the triage vital signs and the nursing notes.   HISTORY  Chief Complaint Allergic Reaction    HPI Philip Wood is a 75 y.o. male with past medical history of coronary disease, diabetes, lung cancer getting chemotherapy presents from clinic due to concern for allergic reaction.  Patient was getting his second dose of Taxol ever when he shortly developed shortness of breath and chest tightness and feeling like his throat was going to close.  He did not have any hives.  No nausea vomiting or diarrhea.  On arrival to the ED he was in significant respiratory distress with audible wheezing.  He received epinephrine Solu-Medrol Benadryl and Pepcid.  Patient has no history of anaphylaxis or other allergies.  He currently feels back to baseline denies dyspnea.      Past Medical History:  Diagnosis Date   Arthritis    Bruit of left carotid artery    Cancer ([place])    laryngeal   Chronic heart failure (HCC)    Chronic kidney disease    COPD (chronic obstructive pulmonary disease) (HCC)    Coronary artery disease    Diabetes mellitus without complication (HCC)    GERD (gastroesophageal reflux disease)    Headache    History of kidney stones    Hyperlipidemia    Hypertension    Lumbago    Neuropathy    Tobacco abuse disorder     Patient Active Problem List   Diagnosis Date Noted   Goals of care, counseling/discussion 03/22/2021   Kidney lesion 03/22/2021   Symptomatic carotid artery narrowing without infarction 01/30/2021   Grieving 01/08/2021   Carotid stenosis 11/27/2020   B12 deficiency 06/01/2020   Aortic atherosclerosis ([place]) 27/51/7001   Chronic systolic heart failure ([place]) 08/13/2018   Lung cancer ([place]) 08/14/2017   Advanced care planning/counseling discussion 07/11/2016    Diabetes mellitus with chronic kidney disease ([place]) 12/21/2014   Hyperlipidemia associated with type 2 diabetes mellitus ([place]) 12/21/2014   Centrilobular emphysema ([place]) 12/21/2014   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[place]) 12/21/2014   CKD (chronic kidney disease), stage III ([place]) 12/21/2014   Nicotine dependence, cigarettes, w unsp disorders 12/21/2014   Lumbago 12/21/2014   Gout 12/21/2014    Past Surgical History:  Procedure Laterality Date   ARTERY BIOPSY Right 11/14/2020   Procedure: BIOPSY TEMPORAL ARTERY;  Surgeon: Katha Cabal, MD;  Location: [facility] OR[facility];  Service: Vascular;  Laterality: Right;   CAROTID PTA/STENT INTERVENTION Left 01/30/2021   Procedure: CAROTID PTA/STENT INTERVENTION;  Surgeon: Katha Cabal, MD;  Location: [facility];  Service: Cardiovascular;  Laterality: Left;   CHOLECYSTECTOMY     ELECTROMAGNETIC NAVIGATION BROCHOSCOPY N/A 08/19/2017   Procedure: ELECTROMAGNETIC NAVIGATION BRONCHOSCOPY;  Surgeon: Flora Lipps, MD;  Location: [facility];  Service: Cardiopulmonary;  Laterality: N/A;   ELECTROMAGNETIC NAVIGATION BROCHOSCOPY Left 09/02/2017   Procedure: ELECTROMAGNETIC NAVIGATION BRONCHOSCOPY;  Surgeon: Flora Lipps, MD;  Location: [facility] OR[facility];  Service: Cardiopulmonary;  Laterality: Left;   IR IMAGING GUIDED PORT INSERTION  03/29/2021   THROAT SURGERY     VIDEO BRONCHOSCOPY WITH ENDOBRONCHIAL ULTRASOUND N/A 03/06/2021   Procedure: VIDEO BRONCHOSCOPY WITH ENDOBRONCHIAL ULTRASOUND;  Surgeon: Tyler Pita, MD;  Location: [facility] OR[facility];  Service: Cardiopulmonary;  Laterality: N/A;    Prior to Admission medications  Medication Sig Start Date End Date Taking? Authorizing Provider  diphenhydrAMINE (BENADRYL ALLERGY) 25 MG tablet Take 1 tablet (25 mg total) by mouth every 6 (six) hours as needed. 04/18/21  Yes Rada Hay, MD  EPINEPHrine 0.3 mg/0.3 mL IJ SOAJ injection Inject 0.3 mg into the muscle as needed for up  to 1 day for anaphylaxis. 04/18/21 04/19/21 Yes Rada Hay, MD  predniSONE (DELTASONE) 50 MG tablet Take once daily for the next 5 days 04/18/21  Yes Rada Hay, MD  acetaminophen (TYLENOL) 650 MG CR tablet Take 1,300 mg by mouth every 8 (eight) hours as needed for pain.    [provider]  albuterol (VENTOLIN HFA) 108 (90 Base) MCG/ACT inhaler Inhale 2 puffs into the lungs every 6 (six) hours as needed for wheezing or shortness of breath. 04/01/21   Tyler Pita, MD  allopurinol (ZYLOPRIM) 100 MG tablet Take 1 tablet (100 mg total) by mouth daily. Patient taking differently: Take 100 mg by mouth in the morning. 05/31/20   Marnee Guarneri T, NP  aspirin EC 81 MG tablet Take 81 mg by mouth in the morning. Swallow whole.    [provider]  atorvastatin (LIPITOR) 80 MG tablet Take 1 tablet (80 mg total) by mouth daily at 6 PM. 05/31/20   Cannady, Henrine Screws T, NP  clopidogrel (PLAVIX) 75 MG tablet Take 1 tablet (75 mg total) by mouth daily. 12/07/20   Kris Hartmann, NP  Ensure (ENSURE) Take 237 mLs by mouth daily.    [provider]  lidocaine-prilocaine (EMLA) cream Apply to affected area once 03/22/21   Earlie Server, MD  metFORMIN (GLUCOPHAGE) 500 MG tablet Take 1 tablet (500 mg total) by mouth 2 (two) times daily with a meal. 01/08/21   Cannady, Henrine Screws T, NP  metoprolol succinate (TOPROL XL) 25 MG 24 hr tablet Take 1 tablet (25 mg total) by mouth daily. Patient taking differently: Take 25 mg by mouth in the morning. 05/31/20   Cannady, Henrine Screws T, NP  mirtazapine (REMERON) 7.5 MG tablet Take 1 tablet (7.5 mg total) by mouth at bedtime. 01/08/21   Cannady, Henrine Screws T, NP  prochlorperazine (COMPAZINE) 10 MG tablet Take 1 tablet (10 mg total) by mouth every 6 (six) hours as needed (Nausea or vomiting). 03/22/21   Earlie Server, MD  ramipril (ALTACE) 10 MG capsule Take 1 capsule (10 mg total) by mouth daily. Patient taking differently: Take 10 mg by mouth in the morning.  05/31/20   Cannady, Jolene T, NP  tiotropium (SPIRIVA HANDIHALER) 18 MCG inhalation capsule PLACE 1 CAPSULE INTO INHALER AND INHALE ITS CONTENTS ONCE DAILY. Patient taking differently: Place 18 mcg into inhaler and inhale in the morning. 05/31/20   Cannady, Henrine Screws T, NP  vitamin B-12 (CYANOCOBALAMIN) 1000 MCG tablet Take 1,000 mcg by mouth daily.    [provider]    Allergies Paclitaxel  Family History  Problem Relation Age of Onset   Heart disease Father    Heart attack Father    Brain cancer Sister    Cervical cancer Sister    Stomach cancer Sister    Stomach cancer Sister    Diabetes Sister    Lupus Sister    Rectal cancer Brother    Lung cancer Brother    Lung cancer Brother    Lung cancer Brother    Lung cancer Brother     Social History Social History   Tobacco Use   Smoking status: Light Smoker  Packs/day: 0.50    Years: 53.00    Pack years: 26.50    Types: Cigarettes   Smokeless tobacco: Never   Tobacco comments:    Aprrox 10 cigs/day--02/21/2021  Vaping Use   Vaping Use: Never used  Substance Use Topics   Alcohol use: No    Alcohol/week: 0.0 standard drinks   Drug use: No    Review of Systems   Review of Systems  HENT:  Positive for trouble swallowing.   Respiratory:  Positive for chest tightness and shortness of breath.   Gastrointestinal:  Negative for abdominal pain, nausea and vomiting.  Skin:  Negative for rash.  All other systems reviewed and are negative.  Physical Exam Updated Vital Signs BP 106/81    Pulse 84    Temp (!) 97.5 F (36.4 C) (Oral)    Resp 18    Ht 5\' 9"  (1.753 m)    Wt 74.8 kg    SpO2 97%    BMI 24.37 kg/m   Physical Exam Vitals and nursing note reviewed.  Constitutional:      General: He is not in acute distress.    Appearance: Normal appearance.  HENT:     Head: Normocephalic and atraumatic.  Eyes:     General: No scleral icterus.    Conjunctiva/sclera: Conjunctivae normal.  Cardiovascular:     Rate and  Rhythm: Normal rate and regular rhythm.  Pulmonary:     Effort: Pulmonary effort is normal. No respiratory distress.     Breath sounds: Normal breath sounds. No wheezing.  Musculoskeletal:        General: No deformity or signs of injury.     Cervical back: Normal range of motion.  Skin:    Coloration: Skin is not jaundiced or pale.  Neurological:     General: No focal deficit present.     Mental Status: He is alert and oriented to person, place, and time. Mental status is at baseline.  Psychiatric:        Mood and Affect: Mood normal.        Behavior: Behavior normal.     LABS (all labs ordered are listed, but only abnormal results are displayed)  Labs Reviewed - No data to display ____________________________________________  EKG   ____________________________________________  RADIOLOGY Almeta Monas, personally viewed and evaluated these images (plain radiographs) as part of my medical decision making, as well as reviewing the written report by the radiologist.  ED MD interpretation:      ____________________________________________   PROCEDURES  Procedure(s) performed (including Critical Care):  Procedures   ____________________________________________   INITIAL IMPRESSION / ASSESSMENT AND PLAN / ED COURSE     Patient is a 75 year old male who is being treated for lung cancer who presents from oncology clinic today for anaphylactic reaction.  He was on his second dose of Taxol.  He developed wheezing shortness of breath hypoxia and chest tightness while getting the infusion.  He arrived in the ED on 10 L nonrebreather.  He received epinephrine Solu-Medrol Pepcid and Benadryl prior to arrival to the ED.  My assessment patient is now asymptomatic and appears well.  He is no longer wheezing is not in any respiratory distress.  Patient do strongly suggest an anaphylactic reaction to the Taxol.  He was observed in the emergency department for 4 hours without  any recurrent symptoms.  He will be discharged with an EpiPen, prednisone and Benadryl.  We discussed return precautions.      ____________________________________________  FINAL CLINICAL IMPRESSION(S) / ED DIAGNOSES  Final diagnoses:  Anaphylaxis, initial encounter     ED Discharge Orders          Ordered    predniSONE (DELTASONE) 50 MG tablet        04/18/21 1539    diphenhydrAMINE (BENADRYL ALLERGY) 25 MG tablet  Every 6 hours PRN        04/18/21 1539    EPINEPHrine 0.3 mg/0.3 mL IJ SOAJ injection  As needed        04/18/21 1539             Note:  This document was prepared using Dragon voice recognition software and may include unintentional dictation errors.    Rada Hay, MD 04/18/21 1540

## 2021-04-18 NOTE — Progress Notes (Signed)
Patient here for 2nd treatment of taxol/carbo. Baseline vitals 111/71.P 73 R 18.   1119 Patient came back from [facility] asking for the RN to come over. He stated he couldn't breath and chest was tight. Patient was wheezing with labored breathing. Vitals BP 157/104 HR 93 O2 85%. Benadryl 25mg  given via port. O2 2L applied. O2 still in 80s, increased to 4L. Fluids ran to gravity. NP notified.   1122 Solu-Medrol 125mg  given, Pepcid 20mg  started, O2 93% on 4L. Patient labored breathing worsening. No other symptoms reported. Denied rash, itching, etc only SOB and chest tightness.   1124 L. Zenia Resides, Np arrived ordered another dose of Solu-Medrol 125mg , given at 1125 via port. Vitals 171/131 HR 109 97% on 4L. BP and HR increasing. No change in labored breathing.   1127 Per NP no re-challenge   1130 Vitals 199/106 HR 113 O2 96% on 4L  1131 Another dose of Benadryl 25mg  given via port. 1L NS @ 999 wide open running.   1134 BP and HR still elevated. SOB. No change in labored breathing, patient struggling to speak no other symptoms reported. BP 179/135 HR  121 96% on 4L.  1135 EMS called  1140 Pepcid ordered by NP, given. NRB mask applied. Dr.Yu arrived.   1143 BP 164/125 HR 125 O2 100% on NRB mask. Fire [employer] arrived.   1148 Epi 0.3mg  IM ordered by Dr.Yu, given Right Deltoid. EMS arrived. BP135/101 HR 123. 98% on 10L NRB mask.   1151 Patient transported to ED. 1L IVF still to gravity. Patient only received 1mL of Taxol. Patient's family was called.

## 2021-04-19 ENCOUNTER — Ambulatory Visit
Admission: RE | Admit: 2021-04-19 | Discharge: 2021-04-19 | Disposition: A | Discharge status: Home / Self Care | Payer: Medicare Other | Source: Ambulatory Visit | Attending: Radiation Oncology | Admitting: Radiation Oncology

## 2021-04-19 DIAGNOSIS — R590 Localized enlarged lymph nodes: Secondary | ICD-10-CM | POA: Diagnosis not present

## 2021-04-19 DIAGNOSIS — C3411 Malignant neoplasm of upper lobe, right bronchus or lung: Secondary | ICD-10-CM | POA: Diagnosis not present

## 2021-04-19 DIAGNOSIS — F17210 Nicotine dependence, cigarettes, uncomplicated: Secondary | ICD-10-CM | POA: Diagnosis not present

## 2021-04-19 DIAGNOSIS — K4090 Unilateral inguinal hernia, without obstruction or gangrene, not specified as recurrent: Secondary | ICD-10-CM | POA: Diagnosis not present

## 2021-04-19 DIAGNOSIS — I130 Hypertensive heart and chronic kidney disease with heart failure and stage 1 through stage 4 chronic kidney disease, or unspecified chronic kidney disease: Secondary | ICD-10-CM | POA: Diagnosis not present

## 2021-04-19 DIAGNOSIS — C329 Malignant neoplasm of larynx, unspecified: Secondary | ICD-10-CM | POA: Diagnosis not present

## 2021-04-19 DIAGNOSIS — Z5111 Encounter for antineoplastic chemotherapy: Secondary | ICD-10-CM | POA: Diagnosis not present

## 2021-04-19 DIAGNOSIS — I2510 Atherosclerotic heart disease of native coronary artery without angina pectoris: Secondary | ICD-10-CM | POA: Diagnosis not present

## 2021-04-19 DIAGNOSIS — E1122 Type 2 diabetes mellitus with diabetic chronic kidney disease: Secondary | ICD-10-CM | POA: Diagnosis not present

## 2021-04-19 DIAGNOSIS — Z510 Encounter for antineoplastic radiation therapy: Secondary | ICD-10-CM | POA: Diagnosis not present

## 2021-04-19 DIAGNOSIS — N189 Chronic kidney disease, unspecified: Secondary | ICD-10-CM | POA: Diagnosis not present

## 2021-04-23 ENCOUNTER — Ambulatory Visit
Admission: RE | Admit: 2021-04-23 | Discharge: 2021-04-23 | Disposition: A | Discharge status: Home / Self Care | Payer: Medicare Other | Source: Ambulatory Visit | Attending: Radiation Oncology | Admitting: Radiation Oncology

## 2021-04-23 DIAGNOSIS — I2510 Atherosclerotic heart disease of native coronary artery without angina pectoris: Secondary | ICD-10-CM | POA: Insufficient documentation

## 2021-04-23 DIAGNOSIS — C3411 Malignant neoplasm of upper lobe, right bronchus or lung: Secondary | ICD-10-CM | POA: Insufficient documentation

## 2021-04-23 DIAGNOSIS — R590 Localized enlarged lymph nodes: Secondary | ICD-10-CM | POA: Diagnosis not present

## 2021-04-23 DIAGNOSIS — K4090 Unilateral inguinal hernia, without obstruction or gangrene, not specified as recurrent: Secondary | ICD-10-CM | POA: Insufficient documentation

## 2021-04-23 DIAGNOSIS — E1122 Type 2 diabetes mellitus with diabetic chronic kidney disease: Secondary | ICD-10-CM | POA: Diagnosis not present

## 2021-04-23 DIAGNOSIS — F17210 Nicotine dependence, cigarettes, uncomplicated: Secondary | ICD-10-CM | POA: Diagnosis not present

## 2021-04-23 DIAGNOSIS — I130 Hypertensive heart and chronic kidney disease with heart failure and stage 1 through stage 4 chronic kidney disease, or unspecified chronic kidney disease: Secondary | ICD-10-CM | POA: Insufficient documentation

## 2021-04-23 DIAGNOSIS — Z510 Encounter for antineoplastic radiation therapy: Secondary | ICD-10-CM | POA: Insufficient documentation

## 2021-04-23 DIAGNOSIS — C329 Malignant neoplasm of larynx, unspecified: Secondary | ICD-10-CM | POA: Insufficient documentation

## 2021-04-23 DIAGNOSIS — Z5111 Encounter for antineoplastic chemotherapy: Secondary | ICD-10-CM | POA: Insufficient documentation

## 2021-04-23 DIAGNOSIS — N189 Chronic kidney disease, unspecified: Secondary | ICD-10-CM | POA: Diagnosis not present

## 2021-04-24 ENCOUNTER — Ambulatory Visit
Admission: RE | Admit: 2021-04-24 | Discharge: 2021-04-24 | Disposition: A | Discharge status: Home / Self Care | Payer: Medicare Other | Source: Ambulatory Visit | Attending: Radiation Oncology | Admitting: Radiation Oncology

## 2021-04-24 DIAGNOSIS — E1122 Type 2 diabetes mellitus with diabetic chronic kidney disease: Secondary | ICD-10-CM | POA: Diagnosis not present

## 2021-04-24 DIAGNOSIS — I130 Hypertensive heart and chronic kidney disease with heart failure and stage 1 through stage 4 chronic kidney disease, or unspecified chronic kidney disease: Secondary | ICD-10-CM | POA: Diagnosis not present

## 2021-04-24 DIAGNOSIS — Z5111 Encounter for antineoplastic chemotherapy: Secondary | ICD-10-CM | POA: Diagnosis not present

## 2021-04-24 DIAGNOSIS — R590 Localized enlarged lymph nodes: Secondary | ICD-10-CM | POA: Diagnosis not present

## 2021-04-24 DIAGNOSIS — K4090 Unilateral inguinal hernia, without obstruction or gangrene, not specified as recurrent: Secondary | ICD-10-CM | POA: Diagnosis not present

## 2021-04-24 DIAGNOSIS — Z510 Encounter for antineoplastic radiation therapy: Secondary | ICD-10-CM | POA: Diagnosis not present

## 2021-04-24 DIAGNOSIS — F17210 Nicotine dependence, cigarettes, uncomplicated: Secondary | ICD-10-CM | POA: Diagnosis not present

## 2021-04-24 DIAGNOSIS — C329 Malignant neoplasm of larynx, unspecified: Secondary | ICD-10-CM | POA: Diagnosis not present

## 2021-04-24 DIAGNOSIS — C3411 Malignant neoplasm of upper lobe, right bronchus or lung: Secondary | ICD-10-CM | POA: Diagnosis not present

## 2021-04-24 DIAGNOSIS — N189 Chronic kidney disease, unspecified: Secondary | ICD-10-CM | POA: Diagnosis not present

## 2021-04-24 DIAGNOSIS — I2510 Atherosclerotic heart disease of native coronary artery without angina pectoris: Secondary | ICD-10-CM | POA: Diagnosis not present

## 2021-04-25 ENCOUNTER — Other Ambulatory Visit: Payer: Self-pay

## 2021-04-25 ENCOUNTER — Inpatient Hospital Stay (HOSPITAL_BASED_OUTPATIENT_CLINIC_OR_DEPARTMENT_OTHER): Payer: Medicare Other | Admitting: Oncology

## 2021-04-25 ENCOUNTER — Inpatient Hospital Stay: Payer: Medicare Other

## 2021-04-25 ENCOUNTER — Ambulatory Visit
Admission: RE | Admit: 2021-04-25 | Discharge: 2021-04-25 | Disposition: A | Discharge status: Home / Self Care | Payer: Medicare Other | Source: Ambulatory Visit | Attending: Radiation Oncology | Admitting: Radiation Oncology

## 2021-04-25 ENCOUNTER — Encounter: Payer: Self-pay | Admitting: Oncology

## 2021-04-25 VITALS — BP 115/84 | HR 75 | Temp 97.3°F | Resp 18 | Wt 166.5 lb

## 2021-04-25 DIAGNOSIS — N289 Disorder of kidney and ureter, unspecified: Secondary | ICD-10-CM

## 2021-04-25 DIAGNOSIS — K4090 Unilateral inguinal hernia, without obstruction or gangrene, not specified as recurrent: Secondary | ICD-10-CM | POA: Diagnosis not present

## 2021-04-25 DIAGNOSIS — K208 Other esophagitis without bleeding: Secondary | ICD-10-CM | POA: Insufficient documentation

## 2021-04-25 DIAGNOSIS — F17210 Nicotine dependence, cigarettes, uncomplicated: Secondary | ICD-10-CM | POA: Diagnosis not present

## 2021-04-25 DIAGNOSIS — C329 Malignant neoplasm of larynx, unspecified: Secondary | ICD-10-CM | POA: Insufficient documentation

## 2021-04-25 DIAGNOSIS — E46 Unspecified protein-calorie malnutrition: Secondary | ICD-10-CM | POA: Insufficient documentation

## 2021-04-25 DIAGNOSIS — C3490 Malignant neoplasm of unspecified part of unspecified bronchus or lung: Secondary | ICD-10-CM | POA: Diagnosis not present

## 2021-04-25 DIAGNOSIS — N189 Chronic kidney disease, unspecified: Secondary | ICD-10-CM | POA: Insufficient documentation

## 2021-04-25 DIAGNOSIS — I130 Hypertensive heart and chronic kidney disease with heart failure and stage 1 through stage 4 chronic kidney disease, or unspecified chronic kidney disease: Secondary | ICD-10-CM | POA: Diagnosis not present

## 2021-04-25 DIAGNOSIS — Z5111 Encounter for antineoplastic chemotherapy: Secondary | ICD-10-CM | POA: Insufficient documentation

## 2021-04-25 DIAGNOSIS — I2510 Atherosclerotic heart disease of native coronary artery without angina pectoris: Secondary | ICD-10-CM | POA: Diagnosis not present

## 2021-04-25 DIAGNOSIS — E1122 Type 2 diabetes mellitus with diabetic chronic kidney disease: Secondary | ICD-10-CM | POA: Diagnosis not present

## 2021-04-25 DIAGNOSIS — Z79899 Other long term (current) drug therapy: Secondary | ICD-10-CM | POA: Insufficient documentation

## 2021-04-25 DIAGNOSIS — C3411 Malignant neoplasm of upper lobe, right bronchus or lung: Secondary | ICD-10-CM | POA: Diagnosis not present

## 2021-04-25 DIAGNOSIS — C3401 Malignant neoplasm of right main bronchus: Secondary | ICD-10-CM | POA: Insufficient documentation

## 2021-04-25 DIAGNOSIS — Z510 Encounter for antineoplastic radiation therapy: Secondary | ICD-10-CM | POA: Diagnosis not present

## 2021-04-25 DIAGNOSIS — R590 Localized enlarged lymph nodes: Secondary | ICD-10-CM | POA: Diagnosis not present

## 2021-04-25 LAB — CBC WITH DIFFERENTIAL/PLATELET
Abs Immature Granulocytes: 0.04 10*3/uL (ref 0.00–0.07)
Basophils Absolute: 0 10*3/uL (ref 0.0–0.1)
Basophils Relative: 0 %
Eosinophils Absolute: 0.1 10*3/uL (ref 0.0–0.5)
Eosinophils Relative: 1 %
HCT: 39.3 % (ref 39.0–52.0)
Hemoglobin: 13 g/dL (ref 13.0–17.0)
Immature Granulocytes: 1 %
Lymphocytes Relative: 15 %
Lymphs Abs: 1.3 10*3/uL (ref 0.7–4.0)
MCH: 31 pg (ref 26.0–34.0)
MCHC: 33.1 g/dL (ref 30.0–36.0)
MCV: 93.8 fL (ref 80.0–100.0)
Monocytes Absolute: 1.2 10*3/uL — ABNORMAL HIGH (ref 0.1–1.0)
Monocytes Relative: 14 %
Neutro Abs: 5.9 10*3/uL (ref 1.7–7.7)
Neutrophils Relative %: 69 %
Platelets: 219 10*3/uL (ref 150–400)
RBC: 4.19 MIL/uL — ABNORMAL LOW (ref 4.22–5.81)
RDW: 13.3 % (ref 11.5–15.5)
WBC: 8.5 10*3/uL (ref 4.0–10.5)
nRBC: 0 % (ref 0.0–0.2)

## 2021-04-25 LAB — COMPREHENSIVE METABOLIC PANEL
ALT: 18 U/L (ref 0–44)
AST: 13 U/L — ABNORMAL LOW (ref 15–41)
Albumin: 3 g/dL — ABNORMAL LOW (ref 3.5–5.0)
Alkaline Phosphatase: 118 U/L (ref 38–126)
Anion gap: 7 (ref 5–15)
BUN: 18 mg/dL (ref 8–23)
CO2: 29 mmol/L (ref 22–32)
Calcium: 8.6 mg/dL — ABNORMAL LOW (ref 8.9–10.3)
Chloride: 100 mmol/L (ref 98–111)
Creatinine, Ser: 0.85 mg/dL (ref 0.61–1.24)
GFR, Estimated: >60 mL/min (ref >60)
Glucose, Bld: 102 mg/dL — ABNORMAL HIGH (ref 70–99)
Potassium: 3.9 mmol/L (ref 3.5–5.1)
Sodium: 136 mmol/L (ref 135–145)
Total Bilirubin: 0.8 mg/dL (ref 0.3–1.2)
Total Protein: 5.9 g/dL — ABNORMAL LOW (ref 6.5–8.1)

## 2021-04-25 MED ORDER — SODIUM CHLORIDE 0.9 % IV SOLN
Freq: Once | INTRAVENOUS | Status: AC
  Filled 2021-04-25: qty 250

## 2021-04-25 MED ORDER — SODIUM CHLORIDE 0.9 % IV SOLN
10.0000 mg | Freq: Once | INTRAVENOUS | Status: AC
  Administered 2021-04-25: 10 mg via INTRAVENOUS
  Filled 2021-04-25: qty 10

## 2021-04-25 MED ORDER — HEPARIN SOD (PORK) LOCK FLUSH 100 UNIT/ML IV SOLN
INTRAVENOUS | Status: AC
  Administered 2021-04-25: 500 [IU]
  Filled 2021-04-25: qty 5

## 2021-04-25 MED ORDER — HEPARIN SOD (PORK) LOCK FLUSH 100 UNIT/ML IV SOLN
500.0000 [IU] | Freq: Once | INTRAVENOUS | Status: AC | PRN
  Filled 2021-04-25: qty 5

## 2021-04-25 MED ORDER — PALONOSETRON HCL INJECTION 0.25 MG/5ML
0.2500 mg | Freq: Once | INTRAVENOUS | Status: AC
  Administered 2021-04-25: 0.25 mg via INTRAVENOUS
  Filled 2021-04-25: qty 5

## 2021-04-25 MED ORDER — SODIUM CHLORIDE 0.9 % IV SOLN
187.8000 mg | Freq: Once | INTRAVENOUS | Status: AC
  Administered 2021-04-25: 190 mg via INTRAVENOUS
  Filled 2021-04-25: qty 19

## 2021-04-25 MED ORDER — DIPHENHYDRAMINE HCL 50 MG/ML IJ SOLN
50.0000 mg | Freq: Once | INTRAMUSCULAR | Status: AC
  Administered 2021-04-25: 50 mg via INTRAVENOUS
  Filled 2021-04-25: qty 1

## 2021-04-25 MED ORDER — FAMOTIDINE IN NACL 20-0.9 MG/50ML-% IV SOLN
20.0000 mg | Freq: Once | INTRAVENOUS | Status: AC
  Administered 2021-04-25: 20 mg via INTRAVENOUS
  Filled 2021-04-25: qty 50

## 2021-04-25 NOTE — Progress Notes (Signed)
Nutrition Assessment   Reason for Assessment:   Referral from Dr Tasia Catchings, lung cancer and poor appetite   ASSESSMENT:  76 year old male with recurrent lung cancer.  Past medical history of CAD, DM, GERD, HLD, laryngeal cancer, COPD, HTN.  Patient receiving concurrent radiation and chemotherapy.  Noted reaction to taxol.  Met with patient during infusion.  Patient reports that his appetite has improved in the last 2 weeks.  Prior to that since September appetite has been down.  States that his wife passed away.  Reports that he likes sausage, eggs, grits for breakfast.  Likes ensure shakes and drinks 1 a day.  Usually has chicken noodle soup for lunch.  He prepares meals or goes out to eat.  Likes meats and vegetables.     Medications:  Remeron, compazine, metformin, vit B 12   Labs: reviewed   Anthropometrics:   Height: 69 inches Weight: 166 lb today 169 lb per [facility] on 1/3 173 lb Sept 15, 2022 BMI: 24  4% weight loss in the last 3 1/2 months  Estimated Energy Needs  Kcals: [phone] Protein: 112-131 g Fluid: 2250 L   NUTRITION DIAGNOSIS: Inadequate oral intake related to cancer, death of spouse as evidenced by poor appetite, weight loss   INTERVENTION:  Recommend increase ensure to BID if able. Coupons given Discussed foods high in calories and protein.   Contact information given   MONITORING, EVALUATION, GOAL: weight trends, intake   Next Visit: Thursday, Jan 19 during infusion  Mishael Krysiak B. Zenia Resides, [place], [place] Registered Dietitian [phone] (mobile)

## 2021-04-25 NOTE — Progress Notes (Signed)
Pt here for follow up. Pt reports no new concerns.

## 2021-04-25 NOTE — Patient Instructions (Signed)
[facility] AT [place]  Discharge Instructions: Thank you for choosing [facility] to provide your oncology and hematology care.  If you have a lab appointment with the [facility], please go directly to the [facility] and check in at the registration area.  Wear comfortable clothing and clothing appropriate for easy access to any Portacath or PICC line.   We strive to give you quality time with your provider. You may need to reschedule your appointment if you arrive late (15 or more minutes).  Arriving late affects you and other patients whose appointments are after yours.  Also, if you miss three or more appointments without notifying the office, you may be dismissed from the clinic at the providers discretion.      For prescription refill requests, have your pharmacy contact our office and allow 72 hours for refills to be completed.    Today you received the following chemotherapy and/or immunotherapy agents Carboplatin        To help prevent nausea and vomiting after your treatment, we encourage you to take your nausea medication as directed.  BELOW ARE SYMPTOMS THAT SHOULD BE REPORTED IMMEDIATELY: *FEVER GREATER THAN 100.4 F (38 C) OR HIGHER *CHILLS OR SWEATING *NAUSEA AND VOMITING THAT IS NOT CONTROLLED WITH YOUR NAUSEA MEDICATION *UNUSUAL SHORTNESS OF BREATH *UNUSUAL BRUISING OR BLEEDING *URINARY PROBLEMS (pain or burning when urinating, or frequent urination) *BOWEL PROBLEMS (unusual diarrhea, constipation, pain near the anus) TENDERNESS IN MOUTH AND THROAT WITH OR WITHOUT PRESENCE OF ULCERS (sore throat, sores in mouth, or a toothache) UNUSUAL RASH, SWELLING OR PAIN  UNUSUAL VAGINAL DISCHARGE OR ITCHING   Items with * indicate a potential emergency and should be followed up as soon as possible or go to the Emergency Department if any problems should occur.  Please show the CHEMOTHERAPY ALERT CARD or IMMUNOTHERAPY ALERT CARD at check-in  to the Emergency Department and triage nurse.  Should you have questions after your visit or need to cancel or reschedule your appointment, please contact [facility] [place] AT [place]  [phone] and follow the prompts.  Office hours are 8:00 a.m. to 4:30 p.m. Monday - Friday. Please note that voicemails left after 4:00 p.m. may not be returned until the following business day.  We are closed weekends and major holidays. You have access to a nurse at all times for urgent questions. Please call the main number to the clinic [phone] and follow the prompts.  For any non-urgent questions, you may also contact your provider using MyChart. We now offer e-Visits for anyone 36 and older to request care online for non-urgent symptoms. For details visit [email or web address].   Also download the MyChart app! Go to the app store, search "MyChart", open the app, select [facility], and log in with your MyChart username and password.  Due to Covid, a mask is required upon entering the hospital/clinic. If you do not have a mask, one will be given to you upon arrival. For doctor visits, patients may have 1 support person aged 55 or older with them. For treatment visits, patients cannot have anyone with them due to current Covid guidelines and our immunocompromised population.

## 2021-04-25 NOTE — Progress Notes (Signed)
Hematology/Oncology Follow up note [phone] [phone]   Patient Care Team: Venita Lick, NP as PCP - General (Nurse Practitioner) Wellington Hampshire, MD as PCP - Cardiology (Cardiology) Beverly Gust, MD (Otolaryngology) Telford Nab, RN as Registered Nurse [name], MD as Radiation Oncologist (Radiation Oncology)  REFERRING PROVIDER: Venita Lick, NP REASON FOR VISIT Follow up for stage III squamous cell carcinoma.  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  He got definitive radiation to the larynx, and the patient has been doing annual checkup with ENT Dr. Tami Ribas.  # 09/02/2017 ENB biopsy of both lung nodule. Pathology of biopsies showed no lesional cells identified. However he is a high risk patient and negative biopsy results do not exclude malignancy.  May 2019 Patient underwent SBRT to the left lung lesion.  # 02/25/2019 CT scan showed right upper lobe nodule interval increase in size.  #03/2019 he finished SBRT to right upper lobe lesion.   01/24/2021, CT with contrast showed progressive right upper lobe malignancy, while separated from the site of regional right upper lobe lesion.  2 masslike areas, including the more central of these which directly invades the mediastinum, with potential direct tracheal invasion.  Progressive right hilar and subcarinal lymphadenopathy.  Left lower lobe at the site of previously noted left lower lobe lesion, there has been progressive septal thickening and nodularity which has slowly increased over numerous prior examinations.  Highly concerning for locally recurrent disease.  Aortic atherosclerosis, in addition to  three-vessel coronary artery disease.  Calcification of aortic valve.  1.7 cm lesion in the posterior aspect of the interpolar region of the right kidney.  Suspicious for small neoplasm.  02/11/2021, PET scan showed hypermetabolic central right upper lobe/perihilar recurrence with adjacent contiguous right paratracheal adenopathy.  No distal or progressive metastasis identified.  Low-level hypermetabolic activity in the right groin associated with it.  Postsurgical changes.  Coronary and aortic atherosclerosis.  #03/06/2021 status post biopsy via bronchoscopy. Pathology showed right mainstem bronchus positive for squamous cell carcinoma. Right mainstem bronchus brushings positive for SCC, lavage was suspicious for malignancy. Subcarinal lymph node negative.  precarinal lymph node negative.  03/26/2021 MRI brain with and without contrast showed age-related atrophy.  Minimal chronic small vessel ischemic changes.  No evidence of metastatic disease.  Incidental tiny cortical infarction.   INTERVAL HISTORY Philip Wood is a 76 y.o. male who has above history reviewed by me today presents for follow-up visit for management of recurrent stage III squamous cell carcinoma He had anaphylactic reaction to taxol with respiratory distress despite receiving steriod, benadryl, pepcid and eventually epinephrine pen, he was sent to ER and symptoms improved and he was released home. He denies any additional episodes.  Today he feels well. No new complaints.  Denies SOB more than his base line, no wheezing or cough.   Review of Systems  Constitutional:  Negative for appetite change, chills, fatigue, fever and unexpected weight change.  HENT:   Negative for hearing loss and voice change.   Eyes:  Negative for eye problems and icterus.  Respiratory:  Positive for shortness of breath. Negative for chest tightness and cough.   Cardiovascular:  Negative for chest pain and  leg swelling.  Gastrointestinal:   Negative for abdominal distention and abdominal pain.  Endocrine: Negative for hot flashes.  Genitourinary:  Negative for difficulty urinating, dysuria and frequency.   Musculoskeletal:  Negative for arthralgias.  Skin:  Negative for itching and rash.  Neurological:  Negative for light-headedness and numbness.  Hematological:  Negative for adenopathy. Does not bruise/bleed easily.  Psychiatric/Behavioral:  Negative for confusion.      MEDICAL HISTORY:  Past Medical History:  Diagnosis Date   Arthritis    Bruit of left carotid artery    Cancer ([place])    laryngeal   Chronic heart failure (HCC)    Chronic kidney disease    COPD (chronic obstructive pulmonary disease) (HCC)    Coronary artery disease    Diabetes mellitus without complication (HCC)    GERD (gastroesophageal reflux disease)    Headache    History of kidney stones    Hyperlipidemia    Hypertension    Lumbago    Neuropathy    Tobacco abuse disorder     SURGICAL HISTORY: Past Surgical History:  Procedure Laterality Date   ARTERY BIOPSY Right 11/14/2020   Procedure: BIOPSY TEMPORAL ARTERY;  Surgeon: Katha Cabal, MD;  Location: [facility];  Service: Vascular;  Laterality: Right;   CAROTID PTA/STENT INTERVENTION Left 01/30/2021   Procedure: CAROTID PTA/STENT INTERVENTION;  Surgeon: Katha Cabal, MD;  Location: [facility];  Service: Cardiovascular;  Laterality: Left;   CHOLECYSTECTOMY     ELECTROMAGNETIC NAVIGATION BROCHOSCOPY N/A 08/19/2017   Procedure: ELECTROMAGNETIC NAVIGATION BRONCHOSCOPY;  Surgeon: Flora Lipps, MD;  Location: [facility];  Service: Cardiopulmonary;  Laterality: N/A;   ELECTROMAGNETIC NAVIGATION BROCHOSCOPY Left 09/02/2017   Procedure: ELECTROMAGNETIC NAVIGATION BRONCHOSCOPY;  Surgeon: Flora Lipps, MD;  Location: [facility];  Service: Cardiopulmonary;  Laterality: Left;   IR IMAGING GUIDED PORT INSERTION  03/29/2021   THROAT SURGERY     VIDEO BRONCHOSCOPY WITH ENDOBRONCHIAL  ULTRASOUND N/A 03/06/2021   Procedure: VIDEO BRONCHOSCOPY WITH ENDOBRONCHIAL ULTRASOUND;  Surgeon: Tyler Pita, MD;  Location: [facility];  Service: Cardiopulmonary;  Laterality: N/A;    SOCIAL HISTORY: Social History   Socioeconomic History   Marital status: Widowed    Spouse name: Not on file   Number of children: Not on file   Years of education: Not on file   Highest education level: 10th grade  Occupational History   Not on file  Tobacco Use   Smoking status: Light Smoker    Packs/day: 0.50    Years: 53.00    Pack years: 26.50    Types: Cigarettes   Smokeless tobacco: Never   Tobacco comments:    Aprrox 10 cigs/day--02/21/2021  Vaping Use   Vaping Use: Never used  Substance and Sexual Activity   Alcohol use: No    Alcohol/week: 0.0 standard drinks   Drug use: No   Sexual activity: Not Currently  Other Topics Concern   Not on file  Social History Narrative   Works part time at [employer] retired   Investment banker, operational of Radio broadcast assistant Strain: Low Risk    Difficulty of Paying Living Expenses: Not hard at all  Food Insecurity: No Food Insecurity   Worried About Charity fundraiser in the Last Year: Never true   Arboriculturist in the Last Year: Never true  Transportation Needs: No Transportation Needs   Lack of Transportation (Medical): No   Lack of Transportation (Non-Medical): No  Physical Activity: Inactive  Days of Exercise per Week: 0 days   Minutes of Exercise per Session: 0 min  Stress: No Stress Concern Present   Feeling of Stress : Not at all  Social Connections: Not on file  Intimate Partner Violence: Not on file    FAMILY HISTORY: Family History  Problem Relation Age of Onset   Heart disease Father    Heart attack Father    Brain cancer Sister    Cervical cancer Sister    Stomach cancer Sister    Stomach cancer Sister    Diabetes Sister    Lupus Sister    Rectal cancer Brother    Lung cancer Brother    Lung cancer Brother     Lung cancer Brother    Lung cancer Brother     ALLERGIES:  is allergic to paclitaxel.  MEDICATIONS:  Current Outpatient Medications  Medication Sig Dispense Refill   acetaminophen (TYLENOL) 650 MG CR tablet Take 1,300 mg by mouth every 8 (eight) hours as needed for pain.     albuterol (VENTOLIN HFA) 108 (90 Base) MCG/ACT inhaler Inhale 2 puffs into the lungs every 6 (six) hours as needed for wheezing or shortness of breath. 8 g 2   allopurinol (ZYLOPRIM) 100 MG tablet Take 1 tablet (100 mg total) by mouth daily. (Patient taking differently: Take 100 mg by mouth in the morning.) 90 tablet 4   aspirin EC 81 MG tablet Take 81 mg by mouth in the morning. Swallow whole.     atorvastatin (LIPITOR) 80 MG tablet Take 1 tablet (80 mg total) by mouth daily at 6 PM. 90 tablet 4   clopidogrel (PLAVIX) 75 MG tablet Take 1 tablet (75 mg total) by mouth daily. 30 tablet 11   diphenhydrAMINE (BENADRYL ALLERGY) 25 MG tablet Take 1 tablet (25 mg total) by mouth every 6 (six) hours as needed. 30 tablet 0   Ensure (ENSURE) Take 237 mLs by mouth daily.     lidocaine-prilocaine (EMLA) cream Apply to affected area once 30 g 3   metFORMIN (GLUCOPHAGE) 500 MG tablet Take 1 tablet (500 mg total) by mouth 2 (two) times daily with a meal. 180 tablet 4   metoprolol succinate (TOPROL XL) 25 MG 24 hr tablet Take 1 tablet (25 mg total) by mouth daily. (Patient taking differently: Take 25 mg by mouth in the morning.) 90 tablet 4   mirtazapine (REMERON) 7.5 MG tablet Take 1 tablet (7.5 mg total) by mouth at bedtime. 90 tablet 4   predniSONE (DELTASONE) 50 MG tablet Take once daily for the next 5 days 5 tablet 0   prochlorperazine (COMPAZINE) 10 MG tablet Take 1 tablet (10 mg total) by mouth every 6 (six) hours as needed (Nausea or vomiting). 30 tablet 1   ramipril (ALTACE) 10 MG capsule Take 1 capsule (10 mg total) by mouth daily. (Patient taking differently: Take 10 mg by mouth in the morning.) 90 capsule 4   tiotropium  (SPIRIVA HANDIHALER) 18 MCG inhalation capsule PLACE 1 CAPSULE INTO INHALER AND INHALE ITS CONTENTS ONCE DAILY. (Patient taking differently: Place 18 mcg into inhaler and inhale in the morning.) 90 capsule 4   vitamin B-12 (CYANOCOBALAMIN) 1000 MCG tablet Take 1,000 mcg by mouth daily.     No current facility-administered medications for this visit.     PHYSICAL EXAMINATION: ECOG PERFORMANCE STATUS: 1 - Symptomatic but completely ambulatory Vitals:   04/25/21 0903  BP: 115/84  Pulse: 75  Resp: 18  Temp: (!) 97.3 F (36.3 C)  SpO2: 98%   Filed Weights   04/25/21 0903  Weight: 166 lb 8 oz (75.5 kg)  Physical Exam Constitutional:      General: He is not in acute distress.    Appearance: He is not diaphoretic.  HENT:     Head: Normocephalic and atraumatic.     Nose: Nose normal.     Mouth/Throat:     Pharynx: No oropharyngeal exudate.  Eyes:     General: No scleral icterus.    Pupils: Pupils are equal, round, and reactive to light.  Cardiovascular:     Rate and Rhythm: Normal rate and regular rhythm.     Heart sounds: No murmur heard. Pulmonary:     Effort: Pulmonary effort is normal. No respiratory distress.     Breath sounds: No rales.     Comments: Decreased breath sound bilaterally Chest:     Chest wall: No tenderness.  Abdominal:     General: There is no distension.     Palpations: Abdomen is soft.     Tenderness: There is no abdominal tenderness.  Musculoskeletal:        General: Normal range of motion.     Cervical back: Normal range of motion and neck supple.  Skin:    General: Skin is warm and dry.     Findings: No erythema.  Neurological:     Mental Status: He is alert and oriented to person, place, and time.     Cranial Nerves: No cranial nerve deficit.     Motor: No abnormal muscle tone.     Coordination: Coordination normal.  Psychiatric:        Mood and Affect: Affect normal.          LABORATORY DATA:  I have reviewed the data as  listed Lab Results  Component Value Date   WBC 8.5 04/25/2021   HGB 13.0 04/25/2021   HCT 39.3 04/25/2021   MCV 93.8 04/25/2021   PLT 219 04/25/2021   Recent Labs    05/31/20 0904 07/30/20 0759 04/08/21 0902 04/18/21 0843 04/25/21 0829  NA 141   < > 137 136 136  K 4.8   < > 4.0 4.0 3.9  CL 101   < > 105 99 100  CO2 26   < > '24 29 29  ' GLUCOSE 96   < > 98 104* 102*  BUN 16   < > '15 16 18  ' CREATININE 1.15   < > 0.75 0.74 0.85  CALCIUM 9.0   < > 8.7* 8.6* 8.6*  GFRNONAA 62   < > >60 >60 >60  GFRAA 72  --   --   --   --   PROT  --    < > 6.1* 5.9* 5.9*  ALBUMIN  --    < > 3.2* 3.0* 3.0*  AST  --    < > 12* 17 13*  ALT  --    < > 11 39 18  ALKPHOS  --    < > 82 182* 118  BILITOT  --    < > 0.5 0.4 0.8   < > = values in this interval not displayed.    RADIOGRAPHIC STUDIES: I have personally reviewed the radiological images as listed and agreed with the findings in the report. MR Brain W Wo Contrast  Result Date: 03/26/2021 CLINICAL DATA:  Squamous cell carcinoma of the right lung.  Staging. EXAM: MRI HEAD WITHOUT AND WITH CONTRAST TECHNIQUE: Multiplanar, multiecho pulse sequences of  the brain and surrounding structures were obtained without and with intravenous contrast. CONTRAST:  7.21m GADAVIST GADOBUTROL 1 MMOL/ML IV SOLN COMPARISON:  10/16/2020 FINDINGS: Brain: Punctate focus of restricted diffusion at the right parietal vertex, diffusion imaging 40 and FLAIR image 46. This was not present in June, and is most consistent with a punctate cortical infarction. No abnormal enhancement occurs in this location. Elsewhere, the brain shows generalized atrophy with a few old small vessel insults of the deep white matter. No sign of mass lesion, hemorrhage, hydrocephalus or extra-axial collection. Vascular: Major vessels at the base of the brain show flow. Skull and upper cervical spine: Negative Sinuses/Orbits: Clear/normal Other: None IMPRESSION: Age related atrophy. Minimal chronic  small-vessel ischemic change of the white matter. No evidence of metastatic disease. Punctate focus of restricted diffusion and T2/FLAIR signal at the right parietal vertex as described above, most consistent with an incidental tiny cortical infarction. Tiny nonenhancing metastasis is not excluded but would be less likely. Electronically Signed   By: MNelson ChimesM.D.   On: 03/26/2021 10:42   PERIPHERAL VASCULAR CATHETERIZATION  Result Date: 01/30/2021 See surgical note for result.  NM PET Image Restag (PS) Skull Base To Thigh  Result Date: 02/13/2021 CLINICAL DATA:  Subsequent treatment strategy for non-small cell lung cancer. Previous radiation therapy. Findings suspicious for local recurrence on recent chest CT EXAM: NUCLEAR MEDICINE PET SKULL BASE TO THIGH TECHNIQUE: 9.4 mCi F-18 FDG was injected intravenously. Full-ring PET imaging was performed from the skull base to thigh after the radiotracer. CT data was obtained and used for attenuation correction and anatomic localization. Fasting blood glucose: 111 mg/dl COMPARISON:  CT chest 01/24/2021 and 07/30/2020.  PET-CT 03/10/2019. FINDINGS: Mediastinal blood pool activity: SUV max 1.5 NECK: No hypermetabolic cervical lymph nodes are identified.There are no lesions of the pharyngeal mucosal space. Incidental CT findings: Left carotid stent noted. CHEST: The recurrent right suprahilar mass demonstrates peripheral hypermetabolic activity which is greatest medially (SUV max 7.6), consistent with local recurrence. This measures approximately 3.4 x 3.2 cm on image 94/3. There is contiguous right paratracheal hypermetabolic adenopathy. The mildly prominent subcarinal node seen on the recent CT demonstrates no hypermetabolic activity. The superior extent of the pulmonary component demonstrates no significant metabolic activity. There is no peripheral hypermetabolic pulmonary activity. Specifically, no hypermetabolic activity is demonstrated associated with the  increased nodular septal thickening in the left lower lobe. Incidental CT findings: Atherosclerosis of the aorta, great vessels and coronary arteries. No significant pleural or pericardial effusion. Radiation changes in the central right upper lobe with left lower lobe scarring. No suspicious peripheral pulmonary nodules. ABDOMEN/PELVIS: There is no hypermetabolic activity within the liver, adrenal glands, spleen or pancreas. There is no hypermetabolic nodal activity. There are apparent postsurgical changes in the right inguinal region with associated low level hypermetabolic activity (SUV max 2.7). Incidental CT findings: Severe right renal atrophy with cortical thinning. Diffuse aortic and branch vessel atherosclerosis, prior cholecystectomy, probable left periaortic lymphangioma (2.0 cm on image 173/3), periampullary duodenal diverticulum and moderate enlargement of the prostate gland are again noted. There is a right inguinal hernia containing fat and an associated small right scrotal hydrocele. SKELETON: There is no hypermetabolic activity to suggest osseous metastatic disease. Incidental CT findings: none IMPRESSION: 1. Hypermetabolic central right upper lobe/perihilar recurrence with adjacent contiguous right paratracheal adenopathy. 2. No distant or progressive metastases identified. No suspicious peripheral lung hypermetabolic activity. 3. Low level hypermetabolic activity in the right groin associated with apparent postsurgical changes, presumably from recent  carotid stent placement. 4. Coronary and Aortic Atherosclerosis (ICD10-I70.0). Electronically Signed   By: Richardean Sale M.D.   On: 02/13/2021 09:39   DG Chest Port 1 View  Result Date: 03/06/2021 CLINICAL DATA:  Status post bronchoscopy EXAM: PORTABLE CHEST 1 VIEW COMPARISON:  Chest radiograph from 07/17/2017, CT chest 01/24/2021 FINDINGS: The cardiomediastinal silhouette is normal. Is peculiar did mass is seen in the right suprahilar region  with surrounding architectural distortion as seen on the recent CT chest. There is no evidence of pneumothorax following bronchoscopy. The lungs are otherwise clear. There is no pleural effusion. There is no acute osseous abnormality. IMPRESSION: 1. No evidence of pneumothorax following bronchoscopy. 2. Right upper lobe mass as seen on prior chest CT. Electronically Signed   By: Valetta Mole M.D.   On: 03/06/2021 14:28   IR IMAGING GUIDED PORT INSERTION  Result Date: 03/29/2021 INDICATION: RIGHT lung cancer. EXAM: IMPLANTED PORT A CATH PLACEMENT WITH ULTRASOUND AND FLUOROSCOPIC GUIDANCE MEDICATIONS: None ANESTHESIA/SEDATION: Moderate (conscious) sedation was employed during this procedure. A total of Versed 1 mg and Fentanyl 50 mcg was administered intravenously. Moderate Sedation Time: 26 minutes. The patient's level of consciousness and vital signs were monitored continuously by radiology nursing throughout the procedure under my direct supervision. FLUOROSCOPY TIME:  0 minutes, 12 seconds (0.9 mGy) COMPLICATIONS: None immediate. PROCEDURE: The procedure, risks, benefits, and alternatives were explained to the patient. Questions regarding the procedure were encouraged and answered. The patient understands and consents to the procedure. The LEFT neck and chest were prepped with chlorhexidine in a sterile fashion, and a sterile drape was applied covering the operative field. Maximum barrier sterile technique with sterile gowns and gloves were used for the procedure. A timeout was performed prior to the initiation of the procedure. Local anesthesia was provided with 1% lidocaine with epinephrine. After creating a small venotomy incision, a micropuncture kit was utilized to access the internal jugular vein under direct, real-time ultrasound guidance. Ultrasound image documentation was performed. The microwire was kinked to measure appropriate catheter length. A subcutaneous port pocket was then created along the  upper chest wall utilizing a combination of sharp and blunt dissection. The pocket was irrigated with sterile saline. A single lumen ISP power injectable port was chosen for placement. The 8 Fr catheter was tunneled from the port pocket site to the venotomy incision. The port was placed in the pocket. The external catheter was trimmed to appropriate length. At the venotomy, an 8 Fr peel-away sheath was placed over a guidewire under fluoroscopic guidance. The catheter was then placed through the sheath and the sheath was removed. Final catheter positioning was confirmed and documented with a fluoroscopic spot radiograph. The port was accessed with a Huber needle, aspirated and flushed with heparinized saline. The port pocket incision was closed with interrupted 3-0 Vicryl suture then Dermabond was applied, including at the venotomy incision. Dressings were placed. The patient tolerated the procedure well without immediate post procedural complication. IMPRESSION: Successful placement of a LEFT internal jugular approach power injectable Port-A-Cath. The tip of the catheter is located at the proximal RIGHT atrium. The catheter is ready for immediate use. Michaelle Birks, MD [facility] Electronically Signed   By: Michaelle Birks M.D.   On: 03/29/2021 11:58      ASSESSMENT & PLAN:  1. Encounter for antineoplastic chemotherapy   2. Malignant neoplasm of bronchus and lung ([place])   3. Squamous cell carcinoma of larynx (HCC)   4. Kidney lesion  Cancer Staging  Lung cancer [facility]) Staging form: Lung, AJCC 8th Edition - Clinical: Stage IIIA (rcT4, cN1, cM0) - Signed by Earlie Server, MD on 02/13/2021  #Presumed lung cancer, status post SBRT of left lung nodule in 2019 and SBRP of right upper lobe nodule . 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  Labs are reviewed and discussed with patient. Proceed with cycle 3 carboplatin. Taxol has been discontinued.   Patient agrees with plan of just receiving single agent carboplatin with RT.   # Squamous cell carcinoma of larynx, recommend he continue to have annual check up with ENT Dr.McQueen.  #Right kidney lesion, not well characterized on PET scan.   will obtain MRI renal protocol for further evaluation of the kidney lesion once he finishes treatment for lung cancer..  # Malnutrition, albumin is 3, trending down. Recommend patient to follow up with dietitian, take ensure supplements.   We spent sufficient time to discuss many aspect of care, questions were answered to patient's satisfaction. Follow-up in 1 week for lab MD, Carboplatin  Earlie Server, MD, PhD  04/25/2021

## 2021-04-26 ENCOUNTER — Ambulatory Visit
Admission: RE | Admit: 2021-04-26 | Discharge: 2021-04-26 | Disposition: A | Discharge status: Home / Self Care | Payer: Medicare Other | Source: Ambulatory Visit | Attending: Radiation Oncology | Admitting: Radiation Oncology

## 2021-04-26 DIAGNOSIS — C329 Malignant neoplasm of larynx, unspecified: Secondary | ICD-10-CM | POA: Diagnosis not present

## 2021-04-26 DIAGNOSIS — F17210 Nicotine dependence, cigarettes, uncomplicated: Secondary | ICD-10-CM | POA: Diagnosis not present

## 2021-04-26 DIAGNOSIS — K4090 Unilateral inguinal hernia, without obstruction or gangrene, not specified as recurrent: Secondary | ICD-10-CM | POA: Diagnosis not present

## 2021-04-26 DIAGNOSIS — N189 Chronic kidney disease, unspecified: Secondary | ICD-10-CM | POA: Diagnosis not present

## 2021-04-26 DIAGNOSIS — Z5111 Encounter for antineoplastic chemotherapy: Secondary | ICD-10-CM | POA: Diagnosis not present

## 2021-04-26 DIAGNOSIS — R590 Localized enlarged lymph nodes: Secondary | ICD-10-CM | POA: Diagnosis not present

## 2021-04-26 DIAGNOSIS — I130 Hypertensive heart and chronic kidney disease with heart failure and stage 1 through stage 4 chronic kidney disease, or unspecified chronic kidney disease: Secondary | ICD-10-CM | POA: Diagnosis not present

## 2021-04-26 DIAGNOSIS — C3411 Malignant neoplasm of upper lobe, right bronchus or lung: Secondary | ICD-10-CM | POA: Diagnosis not present

## 2021-04-26 DIAGNOSIS — I2510 Atherosclerotic heart disease of native coronary artery without angina pectoris: Secondary | ICD-10-CM | POA: Diagnosis not present

## 2021-04-26 DIAGNOSIS — Z510 Encounter for antineoplastic radiation therapy: Secondary | ICD-10-CM | POA: Diagnosis not present

## 2021-04-26 DIAGNOSIS — E1122 Type 2 diabetes mellitus with diabetic chronic kidney disease: Secondary | ICD-10-CM | POA: Diagnosis not present

## 2021-04-29 ENCOUNTER — Ambulatory Visit
Admission: RE | Admit: 2021-04-29 | Discharge: 2021-04-29 | Disposition: A | Discharge status: Home / Self Care | Payer: Medicare Other | Source: Ambulatory Visit | Attending: Radiation Oncology | Admitting: Radiation Oncology

## 2021-04-29 DIAGNOSIS — N189 Chronic kidney disease, unspecified: Secondary | ICD-10-CM | POA: Diagnosis not present

## 2021-04-29 DIAGNOSIS — E1122 Type 2 diabetes mellitus with diabetic chronic kidney disease: Secondary | ICD-10-CM | POA: Diagnosis not present

## 2021-04-29 DIAGNOSIS — I2510 Atherosclerotic heart disease of native coronary artery without angina pectoris: Secondary | ICD-10-CM | POA: Diagnosis not present

## 2021-04-29 DIAGNOSIS — I130 Hypertensive heart and chronic kidney disease with heart failure and stage 1 through stage 4 chronic kidney disease, or unspecified chronic kidney disease: Secondary | ICD-10-CM | POA: Diagnosis not present

## 2021-04-29 DIAGNOSIS — C3411 Malignant neoplasm of upper lobe, right bronchus or lung: Secondary | ICD-10-CM | POA: Diagnosis not present

## 2021-04-29 DIAGNOSIS — R590 Localized enlarged lymph nodes: Secondary | ICD-10-CM | POA: Diagnosis not present

## 2021-04-29 DIAGNOSIS — C329 Malignant neoplasm of larynx, unspecified: Secondary | ICD-10-CM | POA: Diagnosis not present

## 2021-04-29 DIAGNOSIS — K4090 Unilateral inguinal hernia, without obstruction or gangrene, not specified as recurrent: Secondary | ICD-10-CM | POA: Diagnosis not present

## 2021-04-29 DIAGNOSIS — Z510 Encounter for antineoplastic radiation therapy: Secondary | ICD-10-CM | POA: Diagnosis not present

## 2021-04-29 DIAGNOSIS — F17210 Nicotine dependence, cigarettes, uncomplicated: Secondary | ICD-10-CM | POA: Diagnosis not present

## 2021-04-29 DIAGNOSIS — Z5111 Encounter for antineoplastic chemotherapy: Secondary | ICD-10-CM | POA: Diagnosis not present

## 2021-04-30 ENCOUNTER — Ambulatory Visit
Admission: RE | Admit: 2021-04-30 | Discharge: 2021-04-30 | Disposition: A | Discharge status: Home / Self Care | Payer: Medicare Other | Source: Ambulatory Visit | Attending: Radiation Oncology | Admitting: Radiation Oncology

## 2021-04-30 DIAGNOSIS — K4090 Unilateral inguinal hernia, without obstruction or gangrene, not specified as recurrent: Secondary | ICD-10-CM | POA: Diagnosis not present

## 2021-04-30 DIAGNOSIS — F17210 Nicotine dependence, cigarettes, uncomplicated: Secondary | ICD-10-CM | POA: Diagnosis not present

## 2021-04-30 DIAGNOSIS — R590 Localized enlarged lymph nodes: Secondary | ICD-10-CM | POA: Diagnosis not present

## 2021-04-30 DIAGNOSIS — Z510 Encounter for antineoplastic radiation therapy: Secondary | ICD-10-CM | POA: Diagnosis not present

## 2021-04-30 DIAGNOSIS — N189 Chronic kidney disease, unspecified: Secondary | ICD-10-CM | POA: Diagnosis not present

## 2021-04-30 DIAGNOSIS — I2510 Atherosclerotic heart disease of native coronary artery without angina pectoris: Secondary | ICD-10-CM | POA: Diagnosis not present

## 2021-04-30 DIAGNOSIS — Z5111 Encounter for antineoplastic chemotherapy: Secondary | ICD-10-CM | POA: Diagnosis not present

## 2021-04-30 DIAGNOSIS — C329 Malignant neoplasm of larynx, unspecified: Secondary | ICD-10-CM | POA: Diagnosis not present

## 2021-04-30 DIAGNOSIS — I130 Hypertensive heart and chronic kidney disease with heart failure and stage 1 through stage 4 chronic kidney disease, or unspecified chronic kidney disease: Secondary | ICD-10-CM | POA: Diagnosis not present

## 2021-04-30 DIAGNOSIS — E1122 Type 2 diabetes mellitus with diabetic chronic kidney disease: Secondary | ICD-10-CM | POA: Diagnosis not present

## 2021-04-30 DIAGNOSIS — C3411 Malignant neoplasm of upper lobe, right bronchus or lung: Secondary | ICD-10-CM | POA: Diagnosis not present

## 2021-05-01 ENCOUNTER — Ambulatory Visit (INDEPENDENT_AMBULATORY_CARE_PROVIDER_SITE_OTHER): Payer: Medicare Other | Admitting: Nurse Practitioner

## 2021-05-01 ENCOUNTER — Ambulatory Visit
Admission: RE | Admit: 2021-05-01 | Discharge: 2021-05-01 | Disposition: A | Discharge status: Home / Self Care | Payer: Medicare Other | Source: Ambulatory Visit | Attending: Radiation Oncology | Admitting: Radiation Oncology

## 2021-05-01 ENCOUNTER — Other Ambulatory Visit: Payer: Self-pay

## 2021-05-01 ENCOUNTER — Encounter: Payer: Self-pay | Admitting: Nurse Practitioner

## 2021-05-01 VITALS — BP 123/69 | HR 83 | Temp 98.4°F | Ht 69.0 in | Wt 164.4 lb

## 2021-05-01 DIAGNOSIS — F17219 Nicotine dependence, cigarettes, with unspecified nicotine-induced disorders: Secondary | ICD-10-CM | POA: Diagnosis not present

## 2021-05-01 DIAGNOSIS — E538 Deficiency of other specified B group vitamins: Secondary | ICD-10-CM

## 2021-05-01 DIAGNOSIS — C3490 Malignant neoplasm of unspecified part of unspecified bronchus or lung: Secondary | ICD-10-CM | POA: Diagnosis not present

## 2021-05-01 DIAGNOSIS — I6522 Occlusion and stenosis of left carotid artery: Secondary | ICD-10-CM

## 2021-05-01 DIAGNOSIS — E0822 Diabetes mellitus due to underlying condition with diabetic chronic kidney disease: Secondary | ICD-10-CM

## 2021-05-01 DIAGNOSIS — K4090 Unilateral inguinal hernia, without obstruction or gangrene, not specified as recurrent: Secondary | ICD-10-CM | POA: Diagnosis not present

## 2021-05-01 DIAGNOSIS — E1122 Type 2 diabetes mellitus with diabetic chronic kidney disease: Secondary | ICD-10-CM | POA: Diagnosis not present

## 2021-05-01 DIAGNOSIS — R590 Localized enlarged lymph nodes: Secondary | ICD-10-CM | POA: Diagnosis not present

## 2021-05-01 DIAGNOSIS — N1831 Chronic kidney disease, stage 3a: Secondary | ICD-10-CM | POA: Diagnosis not present

## 2021-05-01 DIAGNOSIS — N189 Chronic kidney disease, unspecified: Secondary | ICD-10-CM | POA: Diagnosis not present

## 2021-05-01 DIAGNOSIS — I5022 Chronic systolic (congestive) heart failure: Secondary | ICD-10-CM | POA: Diagnosis not present

## 2021-05-01 DIAGNOSIS — E1169 Type 2 diabetes mellitus with other specified complication: Secondary | ICD-10-CM

## 2021-05-01 DIAGNOSIS — Z510 Encounter for antineoplastic radiation therapy: Secondary | ICD-10-CM | POA: Diagnosis not present

## 2021-05-01 DIAGNOSIS — I700 Atherosclerosis of aorta: Secondary | ICD-10-CM | POA: Diagnosis not present

## 2021-05-01 DIAGNOSIS — C3401 Malignant neoplasm of right main bronchus: Secondary | ICD-10-CM

## 2021-05-01 DIAGNOSIS — I130 Hypertensive heart and chronic kidney disease with heart failure and stage 1 through stage 4 chronic kidney disease, or unspecified chronic kidney disease: Secondary | ICD-10-CM

## 2021-05-01 DIAGNOSIS — E785 Hyperlipidemia, unspecified: Secondary | ICD-10-CM

## 2021-05-01 DIAGNOSIS — N183 Chronic kidney disease, stage 3 unspecified: Secondary | ICD-10-CM

## 2021-05-01 DIAGNOSIS — I2510 Atherosclerotic heart disease of native coronary artery without angina pectoris: Secondary | ICD-10-CM | POA: Diagnosis not present

## 2021-05-01 DIAGNOSIS — J432 Centrilobular emphysema: Secondary | ICD-10-CM

## 2021-05-01 DIAGNOSIS — Z5111 Encounter for antineoplastic chemotherapy: Secondary | ICD-10-CM | POA: Diagnosis not present

## 2021-05-01 DIAGNOSIS — C329 Malignant neoplasm of larynx, unspecified: Secondary | ICD-10-CM | POA: Diagnosis not present

## 2021-05-01 DIAGNOSIS — C3411 Malignant neoplasm of upper lobe, right bronchus or lung: Secondary | ICD-10-CM | POA: Diagnosis not present

## 2021-05-01 DIAGNOSIS — F17210 Nicotine dependence, cigarettes, uncomplicated: Secondary | ICD-10-CM | POA: Diagnosis not present

## 2021-05-01 LAB — BAYER DCA HB A1C WAIVED: HB A1C (BAYER DCA - WAIVED): 5.6 % (ref 4.8–5.6)

## 2021-05-01 NOTE — Assessment & Plan Note (Signed)
Quit smoking when chemo started, praised for this and recommend continued cessation.

## 2021-05-01 NOTE — Assessment & Plan Note (Signed)
Ongoing, continue current regimen and recheck levels as needed.

## 2021-05-01 NOTE — Assessment & Plan Note (Signed)
Chronic, ongoing.  Praised for quitting smoking at this time.  Continue current medication regimen and adjust as needed.  Recommend continued smoking cessation.  Continue collaboration with pulmonary, recent notes reviewed.  At this time he denies symptoms. Labs up to date.

## 2021-05-01 NOTE — Assessment & Plan Note (Signed)
Chronic, ongoing.  Euvolemic today. Continue current medication regimen and collaboration with cardiology.  CMP up to date.  Recommend: - Reminded to call for an overnight weight gain of >2 pounds or a weekly weight weight of >5 pounds - not adding salt to his food and has been reading food labels. Reviewed the importance of keeping daily sodium intake to 2000mg  daily - Avoid Ibuprofen products

## 2021-05-01 NOTE — Assessment & Plan Note (Signed)
Chronic, ongoing.  A1C 6% last visit, remaining very stable -- recheck today.  Urine ALB 80 and A:C 30-300 in February 2022, continue Ramipril for kidney protection and plan for recheck next visit.  Continue current medication regimen and adjust as needed.  Recommend he check BS at home at least a few mornings a week.  Consider reduction of medication next visit, if remains stable.  CMP up to date.  Return in 6 months.

## 2021-05-01 NOTE — Assessment & Plan Note (Signed)
Continue collaboration with oncology and pulmonary.  Recommend continued cessation of smoking.  Recent labs and notes reviewed.

## 2021-05-01 NOTE — Patient Instructions (Signed)

## 2021-05-01 NOTE — Progress Notes (Signed)
BP 123/69    Pulse 83    Temp 98.4 F (36.9 C) (Oral)    Ht 5\' 9"  (1.753 m)    Wt 164 lb 6.4 oz (74.6 kg)    SpO2 96%    BMI 24.28 kg/m    Subjective:    Patient ID: Philip Wood, male    DOB: [birth year], 76 y.o.   MRN: [id number]  HPI: Philip Wood is a 76 y.o. male  Chief Complaint  Patient presents with   Diabetes   Hyperlipidemia   Hypertension   COPD   Mood   Lung Cancer   DIABETES Continues on Metformin 500 MG BID.  Last A1C was 6% in October. Taking B12 supplement for history of low levels.  Hypoglycemic episodes:no Polydipsia/polyuria: no Visual disturbance: no Chest pain: no Paresthesias: no Glucose Monitoring: no  Accucheck frequency: Not Checking  Fasting glucose:  Post prandial:  Evening:  Before meals: Taking Insulin?: no  Long acting insulin:  Short acting insulin: Blood Pressure Monitoring: a few times a week Retinal Examination: Up To Date Foot Exam: Up to Date Pneumovax: Up to Date Influenza: Up to Date Aspirin: yes   HYPERTENSION / HYPERLIPIDEMIA/HF Continues Ramipril, Metoprolol, and Atorvastatin + ASA.  Is followed by cardiology and last saw 01/03/21.  Last echo EF 45-50% in 2019.  Satisfied with current treatment? yes Duration of hypertension: chronic BP monitoring frequency: a few times a week BP range: 120/80's average BP medication side effects: no Duration of hyperlipidemia: chronic Cholesterol medication side effects: no Cholesterol supplements: none Medication compliance: good compliance Aspirin: yes Recent stressors: no Recurrent headaches: no Visual changes: no Palpitations: no Dyspnea: baseline, no worse Chest pain: no Lower extremity edema: no Dizzy/lightheaded: no   LUNG CANCER (Right upper lobe nodule ): Followed by oncology -- is receiving radiation and chemo at this time.  Last saw Dr. Baruch Gouty 05/01/21 and Dr. Tasia Catchings 04/25/21.  He has quit smoking, quit when started chemo.  Saw pulmonary last 04/01/21.  Last CT  scan 01/24/21 emphysema and aortic atherosclerosis.  Continues to use inhalers.  Continues on Mirtazapine at night for appetite. COPD status: stable Satisfied with current treatment?: yes Oxygen use: no Dyspnea frequency: occasional Cough frequency: occasional Rescue inhaler frequency: has not used Limitation of activity: no Productive cough: none Last Spirometry: with pulmonary Pneumovax: Up to Date Influenza: Up to Date  Relevant past medical, surgical, family and social history reviewed and updated as indicated. Interim medical history since our last visit reviewed. Allergies and medications reviewed and updated.  Review of Systems  Constitutional:  Negative for activity change, diaphoresis, fatigue and fever.  Respiratory:  Negative for cough, chest tightness, shortness of breath and wheezing.   Cardiovascular:  Negative for chest pain, palpitations and leg swelling.  Gastrointestinal: Negative.   Endocrine: Negative for cold intolerance, heat intolerance, polydipsia, polyphagia and polyuria.  Neurological: Negative.   Psychiatric/Behavioral: Negative.     Per HPI unless specifically indicated above     Objective:    BP 123/69    Pulse 83    Temp 98.4 F (36.9 C) (Oral)    Ht 5\' 9"  (1.753 m)    Wt 164 lb 6.4 oz (74.6 kg)    SpO2 96%    BMI 24.28 kg/m   Wt Readings from Last 3 Encounters:  05/01/21 164 lb 6.4 oz (74.6 kg)  04/25/21 166 lb 8 oz (75.5 kg)  04/18/21 165 lb (74.8 kg)    Physical Exam Vitals and nursing  note reviewed.  Constitutional:      General: He is awake. He is not in acute distress.    Appearance: He is well-developed and well-groomed. He is not ill-appearing or toxic-appearing.  HENT:     Head: Normocephalic and atraumatic.     Right Ear: Hearing, ear canal and external ear normal.     Left Ear: Hearing, ear canal and external ear normal.     Nose:     Right Sinus: No maxillary sinus tenderness or frontal sinus tenderness.     Left Sinus: No  maxillary sinus tenderness or frontal sinus tenderness.  Eyes:     General: Lids are normal.        Right eye: No discharge.        Left eye: No discharge.     Extraocular Movements: Extraocular movements intact.     Conjunctiva/sclera: Conjunctivae normal.     Pupils: Pupils are equal, round, and reactive to light.     Visual Fields: Right eye visual fields normal and left eye visual fields normal.  Neck:     Thyroid: No thyromegaly.     Vascular: No carotid bruit.  Cardiovascular:     Rate and Rhythm: Normal rate and regular rhythm.     Heart sounds: Normal heart sounds, S1 normal and S2 normal. No murmur heard.   No gallop.  Pulmonary:     Effort: Pulmonary effort is normal. No accessory muscle usage or respiratory distress.     Breath sounds: Decreased breath sounds and wheezing present.     Comments: Decreased sounds throughout with expiratory wheezes per baseline. Abdominal:     General: Bowel sounds are normal.     Palpations: Abdomen is soft.  Musculoskeletal:        General: Normal range of motion.     Cervical back: Normal range of motion and neck supple.     Right lower leg: No edema.     Left lower leg: No edema.  Lymphadenopathy:     Cervical: No cervical adenopathy.  Skin:    General: Skin is warm and dry.  Neurological:     Mental Status: He is alert and oriented to person, place, and time.     Motor: Motor function is intact.     Coordination: Coordination is intact.     Gait: Gait is intact.     Deep Tendon Reflexes: Reflexes are normal and symmetric.     Reflex Scores:      Brachioradialis reflexes are 2+ on the right side and 2+ on the left side.      Patellar reflexes are 2+ on the right side and 2+ on the left side. Psychiatric:        Attention and Perception: Attention normal.        Mood and Affect: Mood normal.        Speech: Speech normal.        Behavior: Behavior normal. Behavior is cooperative.        Thought Content: Thought content normal.    Results for orders placed or performed in visit on 04/25/21  Comprehensive metabolic panel  Result Value Ref Range   Sodium 136 135 - 145 mmol/L   Potassium 3.9 3.5 - 5.1 mmol/L   Chloride 100 98 - 111 mmol/L   CO2 29 22 - 32 mmol/L   Glucose, Bld 102 (H) 70 - 99 mg/dL   BUN 18 8 - 23 mg/dL   Creatinine, Ser 0.85 0.61 - 1.24  mg/dL   Calcium 8.6 (L) 8.9 - 10.3 mg/dL   Total Protein 5.9 (L) 6.5 - 8.1 g/dL   Albumin 3.0 (L) 3.5 - 5.0 g/dL   AST 13 (L) 15 - 41 U/L   ALT 18 0 - 44 U/L   Alkaline Phosphatase 118 38 - 126 U/L   Total Bilirubin 0.8 0.3 - 1.2 mg/dL   GFR, Estimated >60 >60 mL/min   Anion gap 7 5 - 15  CBC with Differential  Result Value Ref Range   WBC 8.5 4.0 - 10.5 K/uL   RBC 4.19 (L) 4.22 - 5.81 MIL/uL   Hemoglobin 13.0 13.0 - 17.0 g/dL   HCT 39.3 39.0 - 52.0 %   MCV 93.8 80.0 - 100.0 fL   MCH 31.0 26.0 - 34.0 pg   MCHC 33.1 30.0 - 36.0 g/dL   RDW 13.3 11.5 - 15.5 %   Platelets 219 150 - 400 K/uL   nRBC 0.0 0.0 - 0.2 %   Neutrophils Relative % 69 %   Neutro Abs 5.9 1.7 - 7.7 K/uL   Lymphocytes Relative 15 %   Lymphs Abs 1.3 0.7 - 4.0 K/uL   Monocytes Relative 14 %   Monocytes Absolute 1.2 (H) 0.1 - 1.0 K/uL   Eosinophils Relative 1 %   Eosinophils Absolute 0.1 0.0 - 0.5 K/uL   Basophils Relative 0 %   Basophils Absolute 0.0 0.0 - 0.1 K/uL   Immature Granulocytes 1 %   Abs Immature Granulocytes 0.04 0.00 - 0.07 K/uL      Assessment & Plan:   Problem List Items Addressed This Visit       Cardiovascular and Mediastinum   Aortic atherosclerosis ([place])    Noted on past CT imaging of lungs.  Recommend to continue statin and ASA daily for prevention.  Recommend complete cessation of smoking.      Carotid stenosis    Stent placement to left in 2022 -- continue collaboration with vascular.      Chronic systolic heart failure (HCC)    Chronic, ongoing.  Euvolemic today. Continue current medication regimen and collaboration with cardiology.  CMP up to  date.  Recommend: - Reminded to call for an overnight weight gain of >2 pounds or a weekly weight weight of >5 pounds - not adding salt to his food and has been reading food labels. 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  Home BP at goal per patient.  Continue current medication regimen and collaboration with cardiology.  CMP up to date.  Recommend focus on DASH diet at home and continue to monitor BP and weight closely. Refills sent in as needed.  Return in 6 months.        Respiratory   Centrilobular emphysema (HCC)    Chronic, ongoing.  Praised for quitting smoking at this time.  Continue current medication regimen and adjust as needed.  Recommend continued smoking cessation.  Continue collaboration with pulmonary, recent notes reviewed.  At this time he denies symptoms. Labs up to date.      Malignant neoplasm of bronchus and lung ([place]) - Primary    Continue collaboration with oncology and pulmonary.  Recommend continued cessation of smoking.  Recent labs and notes reviewed.        Endocrine  Diabetes mellitus with chronic kidney disease (HCC)    Chronic, ongoing.  A1C 6% last visit, remaining very stable -- recheck today.  Urine ALB 80 and A:C 30-300 in February 2022, continue Ramipril for kidney protection and plan for recheck next visit.  Continue current medication regimen and adjust as needed.  Recommend he check BS at home at least a few mornings a week.  Consider reduction of medication next visit, if remains stable.  CMP up to date.  Return in 6 months.      Relevant Orders   Bayer DCA Hb A1c Waived   Hyperlipidemia associated with type 2 diabetes mellitus (HCC)    Chronic, ongoing.  Continue current medication regimen and adjust as needed.  Lipid panel up to date, will recheck next visit.       Relevant Orders   Bayer DCA Hb A1c Waived     Nervous and Auditory   Nicotine dependence, cigarettes, w unsp disorders    Quit smoking when chemo started, praised for this and recommend continued cessation.        Genitourinary   CKD (chronic kidney disease), stage III ([place])    Recently has been improved on labs since November 2020.  Continue to monitor closely for decline.  Continue Ramipril for protection.        Other   B12 deficiency    Ongoing, continue current regimen and recheck levels as needed.        Follow up plan: Return in about 6 months (around 10/29/2021).

## 2021-05-01 NOTE — Assessment & Plan Note (Signed)
Chronic, ongoing with BP at goal today.  Home BP at goal per patient.  Continue current medication regimen and collaboration with cardiology.  CMP up to date.  Recommend focus on DASH diet at home and continue to monitor BP and weight closely. Refills sent in as needed.  Return in 6 months.

## 2021-05-01 NOTE — Assessment & Plan Note (Signed)
Stent placement to left in 2022 -- continue collaboration with vascular.

## 2021-05-01 NOTE — Assessment & Plan Note (Signed)
Chronic, ongoing.  Continue current medication regimen and adjust as needed.  Lipid panel up to date, will recheck next visit.

## 2021-05-01 NOTE — Assessment & Plan Note (Signed)
Recently has been improved on labs since November 2020.  Continue to monitor closely for decline.  Continue Ramipril for protection.

## 2021-05-01 NOTE — Assessment & Plan Note (Signed)
Noted on past CT imaging of lungs.  Recommend to continue statin and ASA daily for prevention.  Recommend complete cessation of smoking.

## 2021-05-02 ENCOUNTER — Inpatient Hospital Stay (HOSPITAL_BASED_OUTPATIENT_CLINIC_OR_DEPARTMENT_OTHER): Payer: Medicare Other | Admitting: Oncology

## 2021-05-02 ENCOUNTER — Ambulatory Visit: Payer: Medicare Other

## 2021-05-02 ENCOUNTER — Ambulatory Visit
Admission: RE | Admit: 2021-05-02 | Discharge: 2021-05-02 | Disposition: A | Discharge status: Home / Self Care | Payer: Medicare Other | Source: Ambulatory Visit | Attending: Radiation Oncology | Admitting: Radiation Oncology

## 2021-05-02 ENCOUNTER — Inpatient Hospital Stay: Payer: Medicare Other

## 2021-05-02 ENCOUNTER — Encounter: Payer: Self-pay | Admitting: Oncology

## 2021-05-02 VITALS — BP 121/54 | HR 81 | Temp 96.9°F | Resp 18 | Wt 162.4 lb

## 2021-05-02 DIAGNOSIS — Z510 Encounter for antineoplastic radiation therapy: Secondary | ICD-10-CM | POA: Diagnosis not present

## 2021-05-02 DIAGNOSIS — C3490 Malignant neoplasm of unspecified part of unspecified bronchus or lung: Secondary | ICD-10-CM

## 2021-05-02 DIAGNOSIS — C329 Malignant neoplasm of larynx, unspecified: Secondary | ICD-10-CM | POA: Diagnosis not present

## 2021-05-02 DIAGNOSIS — K4090 Unilateral inguinal hernia, without obstruction or gangrene, not specified as recurrent: Secondary | ICD-10-CM | POA: Diagnosis not present

## 2021-05-02 DIAGNOSIS — N289 Disorder of kidney and ureter, unspecified: Secondary | ICD-10-CM

## 2021-05-02 DIAGNOSIS — F17210 Nicotine dependence, cigarettes, uncomplicated: Secondary | ICD-10-CM | POA: Diagnosis not present

## 2021-05-02 DIAGNOSIS — I2510 Atherosclerotic heart disease of native coronary artery without angina pectoris: Secondary | ICD-10-CM | POA: Diagnosis not present

## 2021-05-02 DIAGNOSIS — C3411 Malignant neoplasm of upper lobe, right bronchus or lung: Secondary | ICD-10-CM | POA: Diagnosis not present

## 2021-05-02 DIAGNOSIS — Z5111 Encounter for antineoplastic chemotherapy: Secondary | ICD-10-CM

## 2021-05-02 DIAGNOSIS — R590 Localized enlarged lymph nodes: Secondary | ICD-10-CM | POA: Diagnosis not present

## 2021-05-02 DIAGNOSIS — C3401 Malignant neoplasm of right main bronchus: Secondary | ICD-10-CM

## 2021-05-02 DIAGNOSIS — E1122 Type 2 diabetes mellitus with diabetic chronic kidney disease: Secondary | ICD-10-CM | POA: Diagnosis not present

## 2021-05-02 DIAGNOSIS — N189 Chronic kidney disease, unspecified: Secondary | ICD-10-CM | POA: Diagnosis not present

## 2021-05-02 DIAGNOSIS — I130 Hypertensive heart and chronic kidney disease with heart failure and stage 1 through stage 4 chronic kidney disease, or unspecified chronic kidney disease: Secondary | ICD-10-CM | POA: Diagnosis not present

## 2021-05-02 LAB — CBC WITH DIFFERENTIAL/PLATELET
Abs Immature Granulocytes: 0.02 10*3/uL (ref 0.00–0.07)
Basophils Absolute: 0 10*3/uL (ref 0.0–0.1)
Basophils Relative: 0 %
Eosinophils Absolute: 0.1 10*3/uL (ref 0.0–0.5)
Eosinophils Relative: 2 %
HCT: 34.7 % — ABNORMAL LOW (ref 39.0–52.0)
Hemoglobin: 11.5 g/dL — ABNORMAL LOW (ref 13.0–17.0)
Immature Granulocytes: 0 %
Lymphocytes Relative: 12 %
Lymphs Abs: 0.7 10*3/uL (ref 0.7–4.0)
MCH: 31.3 pg (ref 26.0–34.0)
MCHC: 33.1 g/dL (ref 30.0–36.0)
MCV: 94.3 fL (ref 80.0–100.0)
Monocytes Absolute: 0.9 10*3/uL (ref 0.1–1.0)
Monocytes Relative: 15 %
Neutro Abs: 4.2 10*3/uL (ref 1.7–7.7)
Neutrophils Relative %: 71 %
Platelets: 120 10*3/uL — ABNORMAL LOW (ref 150–400)
RBC: 3.68 MIL/uL — ABNORMAL LOW (ref 4.22–5.81)
RDW: 13.6 % (ref 11.5–15.5)
WBC: 5.9 10*3/uL (ref 4.0–10.5)
nRBC: 0 % (ref 0.0–0.2)

## 2021-05-02 LAB — COMPREHENSIVE METABOLIC PANEL
ALT: 11 U/L (ref 0–44)
AST: 14 U/L — ABNORMAL LOW (ref 15–41)
Albumin: 2.9 g/dL — ABNORMAL LOW (ref 3.5–5.0)
Alkaline Phosphatase: 84 U/L (ref 38–126)
Anion gap: 9 (ref 5–15)
BUN: 20 mg/dL (ref 8–23)
CO2: 24 mmol/L (ref 22–32)
Calcium: 8.2 mg/dL — ABNORMAL LOW (ref 8.9–10.3)
Chloride: 102 mmol/L (ref 98–111)
Creatinine, Ser: 0.8 mg/dL (ref 0.61–1.24)
GFR, Estimated: >60 mL/min (ref >60)
Glucose, Bld: 107 mg/dL — ABNORMAL HIGH (ref 70–99)
Potassium: 3.9 mmol/L (ref 3.5–5.1)
Sodium: 135 mmol/L (ref 135–145)
Total Bilirubin: 0.7 mg/dL (ref 0.3–1.2)
Total Protein: 5.8 g/dL — ABNORMAL LOW (ref 6.5–8.1)

## 2021-05-02 MED ORDER — HEPARIN SOD (PORK) LOCK FLUSH 100 UNIT/ML IV SOLN
500.0000 [IU] | Freq: Once | INTRAVENOUS | Status: DC
  Filled 2021-05-02: qty 5

## 2021-05-02 MED ORDER — HEPARIN SOD (PORK) LOCK FLUSH 100 UNIT/ML IV SOLN
500.0000 [IU] | Freq: Once | INTRAVENOUS | Status: AC | PRN
  Filled 2021-05-02: qty 5

## 2021-05-02 MED ORDER — SODIUM CHLORIDE 0.9% FLUSH
10.0000 mL | INTRAVENOUS | Status: DC | PRN
  Administered 2021-05-02: 10 mL
  Filled 2021-05-02: qty 10

## 2021-05-02 MED ORDER — HEPARIN SOD (PORK) LOCK FLUSH 100 UNIT/ML IV SOLN
INTRAVENOUS | Status: AC
  Administered 2021-05-02: 500 [IU]
  Filled 2021-05-02: qty 5

## 2021-05-02 MED ORDER — SODIUM CHLORIDE 0.9 % IV SOLN
Freq: Once | INTRAVENOUS | Status: AC
  Filled 2021-05-02: qty 250

## 2021-05-02 MED ORDER — DIPHENHYDRAMINE HCL 50 MG/ML IJ SOLN
50.0000 mg | Freq: Once | INTRAMUSCULAR | Status: AC
  Administered 2021-05-02: 50 mg via INTRAVENOUS
  Filled 2021-05-02: qty 1

## 2021-05-02 MED ORDER — SODIUM CHLORIDE 0.9 % IV SOLN
10.0000 mg | Freq: Once | INTRAVENOUS | Status: AC
  Administered 2021-05-02: 10 mg via INTRAVENOUS
  Filled 2021-05-02: qty 10

## 2021-05-02 MED ORDER — SODIUM CHLORIDE 0.9% FLUSH
10.0000 mL | INTRAVENOUS | Status: DC | PRN
  Administered 2021-05-02: 10 mL via INTRAVENOUS
  Filled 2021-05-02: qty 10

## 2021-05-02 MED ORDER — SODIUM CHLORIDE 0.9 % IV SOLN
187.8000 mg | Freq: Once | INTRAVENOUS | Status: AC
  Administered 2021-05-02: 190 mg via INTRAVENOUS
  Filled 2021-05-02: qty 19

## 2021-05-02 MED ORDER — FAMOTIDINE IN NACL 20-0.9 MG/50ML-% IV SOLN
20.0000 mg | Freq: Once | INTRAVENOUS | Status: AC
  Administered 2021-05-02: 20 mg via INTRAVENOUS
  Filled 2021-05-02: qty 50

## 2021-05-02 MED ORDER — PALONOSETRON HCL INJECTION 0.25 MG/5ML
0.2500 mg | Freq: Once | INTRAVENOUS | Status: AC
  Administered 2021-05-02: 0.25 mg via INTRAVENOUS
  Filled 2021-05-02: qty 5

## 2021-05-02 NOTE — Patient Instructions (Signed)
[facility] [place]  Discharge Instructions: Thank you for choosing [facility] to provide your oncology and hematology care.  If you have a lab appointment with the [facility], please go directly to the [facility] and check in at the registration area.  Wear comfortable clothing and clothing appropriate for easy access to any Portacath or PICC line.   We strive to give you quality time with your provider. You may need to reschedule your appointment if you arrive late (15 or more minutes).  Arriving late affects you and other patients whose appointments are after yours.  Also, if you miss three or more appointments without notifying the office, you may be dismissed from the clinic at the providers discretion.      For prescription refill requests, have your pharmacy contact our office and allow 72 hours for refills to be completed.    Today you received the following chemotherapy and/or immunotherapy agents - carboplatin      To help prevent nausea and vomiting after your treatment, we encourage you to take your nausea medication as directed.  BELOW ARE SYMPTOMS THAT SHOULD BE REPORTED IMMEDIATELY: *FEVER GREATER THAN 100.4 F (38 C) OR HIGHER *CHILLS OR SWEATING *NAUSEA AND VOMITING THAT IS NOT CONTROLLED WITH YOUR NAUSEA MEDICATION *UNUSUAL SHORTNESS OF BREATH *UNUSUAL BRUISING OR BLEEDING *URINARY PROBLEMS (pain or burning when urinating, or frequent urination) *BOWEL PROBLEMS (unusual diarrhea, constipation, pain near the anus) TENDERNESS IN MOUTH AND THROAT WITH OR WITHOUT PRESENCE OF ULCERS (sore throat, sores in mouth, or a toothache) UNUSUAL RASH, SWELLING OR PAIN  UNUSUAL VAGINAL DISCHARGE OR ITCHING   Items with * indicate a potential emergency and should be followed up as soon as possible or go to the Emergency Department if any problems should occur.  Please show the CHEMOTHERAPY ALERT CARD or IMMUNOTHERAPY ALERT CARD at check-in  to the Emergency Department and triage nurse.  Should you have questions after your visit or need to cancel or reschedule your appointment, please contact [facility] [place] [facility] [place]  [phone] and follow the prompts.  Office hours are 8:00 a.m. to 4:30 p.m. Monday - Friday. Please note that voicemails left after 4:00 p.m. may not be returned until the following business day.  We are closed weekends and major holidays. You have access to a nurse at all times for urgent questions. Please call the main number to the clinic [phone] and follow the prompts.  For any non-urgent questions, you may also contact your provider using MyChart. We now offer e-Visits for anyone 11 and older to request care online for non-urgent symptoms. For details visit [email or web address].   Also download the MyChart app! Go to the app store, search "MyChart", open the app, select , and log in with your MyChart username and password.  Due to Covid, a mask is required upon entering the hospital/clinic. If you do not have a mask, one will be given to you upon arrival. For doctor visits, patients may have 1 support person aged 41 or older with them. For treatment visits, patients cannot have anyone with them due to current Covid guidelines and our immunocompromised population.   Carboplatin injection What is this medication? CARBOPLATIN (KAR boe pla tin) is a chemotherapy drug. It targets fast dividing cells, like cancer cells, and causes these cells to die. This medicine is used to treat ovarian cancer and many other cancers. This medicine may be used for other purposes; ask your health care provider  or pharmacist if you have questions. COMMON BRAND NAME(S): Paraplatin What should I tell my care team before I take this medication? They need to know if you have any of these conditions: blood disorders hearing problems kidney disease recent or ongoing radiation therapy an unusual or  allergic reaction to carboplatin, cisplatin, other chemotherapy, other medicines, foods, dyes, or preservatives pregnant or trying to get pregnant breast-feeding How should I use this medication? This drug is usually given as an infusion into a vein. It is administered in a hospital or clinic by a specially trained health care professional. Talk to your pediatrician regarding the use of this medicine in children. Special care may be needed. Overdosage: If you think you have taken too much of this medicine contact a poison control center or emergency room at once. NOTE: This medicine is only for you. Do not share this medicine with others. What if I miss a dose? It is important not to miss a dose. Call your doctor or health care professional if you are unable to keep an appointment. What may interact with this medication? medicines for seizures medicines to increase blood counts like filgrastim, pegfilgrastim, sargramostim some antibiotics like amikacin, gentamicin, neomycin, streptomycin, tobramycin vaccines Talk to your doctor or health care professional before taking any of these medicines: acetaminophen aspirin ibuprofen ketoprofen naproxen This list may not describe all possible interactions. Give your health care provider a list of all the medicines, herbs, non-prescription drugs, or dietary supplements you use. Also tell them if you smoke, drink alcohol, or use illegal drugs. Some items may interact with your medicine. What should I watch for while using this medication? Your condition will be monitored carefully while you are receiving this medicine. You will need important blood work done while you are taking this medicine. This drug may make you feel generally unwell. This is not uncommon, as chemotherapy can affect healthy cells as well as cancer cells. Report any side effects. Continue your course of treatment even though you feel ill unless your doctor tells you to stop. In some  cases, you may be given additional medicines to help with side effects. Follow all directions for their use. Call your doctor or health care professional for advice if you get a fever, chills or sore throat, or other symptoms of a cold or flu. Do not treat yourself. This drug decreases your body's ability to fight infections. Try to avoid being around people who are sick. This medicine may increase your risk to bruise or bleed. Call your doctor or health care professional if you notice any unusual bleeding. Be careful brushing and flossing your teeth or using a toothpick because you may get an infection or bleed more easily. If you have any dental work done, tell your dentist you are receiving this medicine. Avoid taking products that contain aspirin, acetaminophen, ibuprofen, naproxen, or ketoprofen unless instructed by your doctor. These medicines may hide a fever. Do not become pregnant while taking this medicine. Women should inform their doctor if they wish to become pregnant or think they might be pregnant. There is a potential for serious side effects to an unborn child. Talk to your health care professional or pharmacist for more information. Do not breast-feed an infant while taking this medicine. What side effects may I notice from receiving this medication? Side effects that you should report to your doctor or health care professional as soon as possible: allergic reactions like skin rash, itching or hives, swelling of the face, lips, or  tongue signs of infection - fever or chills, cough, sore throat, pain or difficulty passing urine signs of decreased platelets or bleeding - bruising, pinpoint red spots on the skin, black, tarry stools, nosebleeds signs of decreased red blood cells - unusually weak or tired, fainting spells, lightheadedness breathing problems changes in hearing changes in vision chest pain high blood pressure low blood counts - This drug may decrease the number of white  blood cells, red blood cells and platelets. You may be at increased risk for infections and bleeding. nausea and vomiting pain, swelling, redness or irritation at the injection site pain, tingling, numbness in the hands or feet problems with balance, talking, walking trouble passing urine or change in the amount of urine Side effects that usually do not require medical attention (report to your doctor or health care professional if they continue or are bothersome): hair loss loss of appetite metallic taste in the mouth or changes in taste This list may not describe all possible side effects. Call your doctor for medical advice about side effects. You may report side effects to FDA at 1-800-FDA-1088. Where should I keep my medication? This drug is given in a hospital or clinic and will not be stored at home. NOTE: This sheet is a summary. It may not cover all possible information. If you have questions about this medicine, talk to your doctor, pharmacist, or health care provider.  2022 Elsevier/Gold Standard (2007-09-15 00:00:00)

## 2021-05-02 NOTE — Progress Notes (Signed)
Patient here for follow up. Pt reports no new concerns.

## 2021-05-02 NOTE — Progress Notes (Signed)
Hematology/Oncology Follow up note [phone] [phone]   Patient Care Team: Venita Lick, NP as PCP - General (Nurse Practitioner) Wellington Hampshire, MD as PCP - Cardiology (Cardiology) Beverly Gust, MD (Otolaryngology) Telford Nab, RN as Registered Nurse [name], MD as Radiation Oncologist (Radiation Oncology)  REFERRING PROVIDER: Venita Lick, NP REASON FOR VISIT Follow up for stage III squamous cell carcinoma.  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  He got definitive radiation to the larynx, and the patient has been doing annual checkup with ENT Dr. Tami Ribas.  # 09/02/2017 ENB biopsy of both lung nodule. Pathology of biopsies showed no lesional cells identified. However he is a high risk patient and negative biopsy results do not exclude malignancy.  May 2019 Patient underwent SBRT to the left lung lesion.  # 02/25/2019 CT scan showed right upper lobe nodule interval increase in size.  #03/2019 he finished SBRT to right upper lobe lesion.   01/24/2021, CT with contrast showed progressive right upper lobe malignancy, while separated from the site of regional right upper lobe lesion.  2 masslike areas, including the more central of these which directly invades the mediastinum, with potential direct tracheal invasion.  Progressive right hilar and subcarinal lymphadenopathy.  Left lower lobe at the site of previously noted left lower lobe lesion, there has been progressive septal thickening and nodularity which has slowly increased over numerous prior examinations.  Highly concerning for locally recurrent disease.  Aortic atherosclerosis, in addition to  three-vessel coronary artery disease.  Calcification of aortic valve.  1.7 cm lesion in the posterior aspect of the interpolar region of the right kidney.  Suspicious for small neoplasm.  02/11/2021, PET scan showed hypermetabolic central right upper lobe/perihilar recurrence with adjacent contiguous right paratracheal adenopathy.  No distal or progressive metastasis identified.  Low-level hypermetabolic activity in the right groin associated with it.  Postsurgical changes.  Coronary and aortic atherosclerosis.  #03/06/2021 status post biopsy via bronchoscopy. Pathology showed right mainstem bronchus positive for squamous cell carcinoma. Right mainstem bronchus brushings positive for SCC, lavage was suspicious for malignancy. Subcarinal lymph node negative.  precarinal lymph node negative.  03/26/2021 MRI brain with and without contrast showed age-related atrophy.  Minimal chronic small vessel ischemic changes.  No evidence of metastatic disease.  Incidental tiny cortical infarction.  04/08/2021 concurrent chemotherapy + RT Cycle 1 weekly carboplatin and Taxol.  04/18/2021 Analylatic reaction to Taxol, ER visit for observation.  04/25/2020 carboplatin single agent.    INTERVAL HISTORY Treylen Gibbs Grable is a 76 y.o. male who has above history reviewed by me today presents for follow-up visit for management of recurrent stage III squamous cell carcinoma Patient is currently on concurrent chemotherapy and radiation with single agent carboplatin.  Patient reports tolerating treatment so far.  Denies any nausea vomiting diarrhea, fever or chills.  Denies any indigestion/heartburn.  Review of Systems  Constitutional:  Negative for appetite change, chills, fatigue, fever and unexpected weight change.  HENT:   Negative for hearing loss and voice change.   Eyes:  Negative for eye problems and icterus.  Respiratory:  Positive for shortness of breath. Negative for chest tightness and cough.    Cardiovascular:  Negative  for chest pain and leg swelling.  Gastrointestinal:  Negative for abdominal distention and abdominal pain.  Endocrine: Negative for hot flashes.  Genitourinary:  Negative for difficulty urinating, dysuria and frequency.   Musculoskeletal:  Negative for arthralgias.  Skin:  Negative for itching and rash.  Neurological:  Negative for light-headedness and numbness.  Hematological:  Negative for adenopathy. Does not bruise/bleed easily.  Psychiatric/Behavioral:  Negative for confusion.      MEDICAL HISTORY:  Past Medical History:  Diagnosis Date   Arthritis    Bruit of left carotid artery    Cancer ([place])    laryngeal   Chronic heart failure (HCC)    Chronic kidney disease    COPD (chronic obstructive pulmonary disease) (HCC)    Coronary artery disease    Diabetes mellitus without complication (HCC)    GERD (gastroesophageal reflux disease)    Headache    History of kidney stones    Hyperlipidemia    Hypertension    Lumbago    Neuropathy    Tobacco abuse disorder     SURGICAL HISTORY: Past Surgical History:  Procedure Laterality Date   ARTERY BIOPSY Right 11/14/2020   Procedure: BIOPSY TEMPORAL ARTERY;  Surgeon: Katha Cabal, MD;  Location: [facility];  Service: Vascular;  Laterality: Right;   CAROTID PTA/STENT INTERVENTION Left 01/30/2021   Procedure: CAROTID PTA/STENT INTERVENTION;  Surgeon: Katha Cabal, MD;  Location: [place] [facility];  Service: Cardiovascular;  Laterality: Left;   CHOLECYSTECTOMY     ELECTROMAGNETIC NAVIGATION BROCHOSCOPY N/A 08/19/2017   Procedure: ELECTROMAGNETIC NAVIGATION BRONCHOSCOPY;  Surgeon: Flora Lipps, MD;  Location: [facility];  Service: Cardiopulmonary;  Laterality: N/A;   ELECTROMAGNETIC NAVIGATION BROCHOSCOPY Left 09/02/2017   Procedure: ELECTROMAGNETIC NAVIGATION BRONCHOSCOPY;  Surgeon: Flora Lipps, MD;  Location: [facility];  Service: Cardiopulmonary;  Laterality: Left;   IR IMAGING GUIDED PORT  INSERTION  03/29/2021   THROAT SURGERY     VIDEO BRONCHOSCOPY WITH ENDOBRONCHIAL ULTRASOUND N/A 03/06/2021   Procedure: VIDEO BRONCHOSCOPY WITH ENDOBRONCHIAL ULTRASOUND;  Surgeon: Tyler Pita, MD;  Location: [facility];  Service: Cardiopulmonary;  Laterality: N/A;    SOCIAL HISTORY: Social History   Socioeconomic History   Marital status: Widowed    Spouse name: Not on file   Number of children: Not on file   Years of education: Not on file   Highest education level: 10th grade  Occupational History   Not on file  Tobacco Use   Smoking status: Light Smoker    Packs/day: 0.50    Years: 53.00    Pack years: 26.50    Types: Cigarettes   Smokeless tobacco: Never   Tobacco comments:    Aprrox 10 cigs/day--02/21/2021  Vaping Use   Vaping Use: Never used  Substance and Sexual Activity   Alcohol use: No    Alcohol/week: 0.0 standard drinks   Drug use: No   Sexual activity: Not Currently  Other Topics Concern   Not on file  Social History Narrative   Works part time at [employer] retired   Investment banker, operational of Radio broadcast assistant Strain: Low Risk    Difficulty of Paying Living Expenses: Not hard at all  Food Insecurity: No Food Insecurity   Worried About Charity fundraiser in the Last Year: Never true   Arboriculturist in the Last Year: Never true  Transportation Needs: No Transportation Needs   Lack of Transportation (Medical): No   Lack of Transportation (Non-Medical): No  Physical Activity:  Inactive   Days of Exercise per Week: 0 days   Minutes of Exercise per Session: 0 min  Stress: No Stress Concern Present   Feeling of Stress : Not at all  Social Connections: Not on file  Intimate Partner Violence: Not on file    FAMILY HISTORY: Family History  Problem Relation Age of Onset   Heart disease Father    Heart attack Father    Brain cancer Sister    Cervical cancer Sister    Stomach cancer Sister    Stomach cancer Sister    Diabetes Sister    Lupus  Sister    Rectal cancer Brother    Lung cancer Brother    Lung cancer Brother    Lung cancer Brother    Lung cancer Brother     ALLERGIES:  is allergic to paclitaxel.  MEDICATIONS:  Current Outpatient Medications  Medication Sig Dispense Refill   acetaminophen (TYLENOL) 650 MG CR tablet Take 1,300 mg by mouth every 8 (eight) hours as needed for pain.     albuterol (VENTOLIN HFA) 108 (90 Base) MCG/ACT inhaler Inhale 2 puffs into the lungs every 6 (six) hours as needed for wheezing or shortness of breath. 8 g 2   allopurinol (ZYLOPRIM) 100 MG tablet Take 1 tablet (100 mg total) by mouth daily. (Patient taking differently: Take 100 mg by mouth in the morning.) 90 tablet 4   aspirin EC 81 MG tablet Take 81 mg by mouth in the morning. Swallow whole.     atorvastatin (LIPITOR) 80 MG tablet Take 1 tablet (80 mg total) by mouth daily at 6 PM. 90 tablet 4   clopidogrel (PLAVIX) 75 MG tablet Take 1 tablet (75 mg total) by mouth daily. 30 tablet 11   diphenhydrAMINE (BENADRYL ALLERGY) 25 MG tablet Take 1 tablet (25 mg total) by mouth every 6 (six) hours as needed. 30 tablet 0   Ensure (ENSURE) Take 237 mLs by mouth daily.     lidocaine-prilocaine (EMLA) cream Apply to affected area once 30 g 3   metFORMIN (GLUCOPHAGE) 500 MG tablet Take 1 tablet (500 mg total) by mouth 2 (two) times daily with a meal. 180 tablet 4   metoprolol succinate (TOPROL XL) 25 MG 24 hr tablet Take 1 tablet (25 mg total) by mouth daily. (Patient taking differently: Take 25 mg by mouth in the morning.) 90 tablet 4   mirtazapine (REMERON) 7.5 MG tablet Take 1 tablet (7.5 mg total) by mouth at bedtime. 90 tablet 4   predniSONE (DELTASONE) 50 MG tablet Take once daily for the next 5 days 5 tablet 0   prochlorperazine (COMPAZINE) 10 MG tablet Take 1 tablet (10 mg total) by mouth every 6 (six) hours as needed (Nausea or vomiting). 30 tablet 1   ramipril (ALTACE) 10 MG capsule Take 1 capsule (10 mg total) by mouth daily. (Patient  taking differently: Take 10 mg by mouth in the morning.) 90 capsule 4   tiotropium (SPIRIVA HANDIHALER) 18 MCG inhalation capsule PLACE 1 CAPSULE INTO INHALER AND INHALE ITS CONTENTS ONCE DAILY. (Patient taking differently: Place 18 mcg into inhaler and inhale in the morning.) 90 capsule 4   vitamin B-12 (CYANOCOBALAMIN) 1000 MCG tablet Take 1,000 mcg by mouth daily.     No current facility-administered medications for this visit.   Facility-Administered Medications Ordered in Other Visits  Medication Dose Route Frequency Provider Last Rate Last Admin   heparin lock flush 100 unit/mL  500 Units Intravenous Once Earlie Server, MD  sodium chloride flush (NS) 0.9 % injection 10 mL  10 mL Intravenous PRN Earlie Server, MD         PHYSICAL EXAMINATION: ECOG PERFORMANCE STATUS: 1 - Symptomatic but completely ambulatory There were no vitals filed for this visit.  There were no vitals filed for this visit. Physical Exam Constitutional:      General: He is not in acute distress.    Appearance: He is not diaphoretic.  HENT:     Head: Normocephalic and atraumatic.     Nose: Nose normal.     Mouth/Throat:     Pharynx: No oropharyngeal exudate.  Eyes:     General: No scleral icterus.    Pupils: Pupils are equal, round, and reactive to light.  Cardiovascular:     Rate and Rhythm: Normal rate and regular rhythm.     Heart sounds: No murmur heard. Pulmonary:     Effort: Pulmonary effort is normal. No respiratory distress.     Breath sounds: No rales.     Comments: Decreased breath sound bilaterally Chest:     Chest wall: No tenderness.  Abdominal:     General: There is no distension.     Palpations: Abdomen is soft.     Tenderness: There is no abdominal tenderness.  Musculoskeletal:        General: Normal range of motion.     Cervical back: Normal range of motion and neck supple.  Skin:    General: Skin is warm and dry.     Findings: No erythema.  Neurological:     Mental Status: He is  alert and oriented to person, place, and time.     Cranial Nerves: No cranial nerve deficit.     Motor: No abnormal muscle tone.     Coordination: Coordination normal.  Psychiatric:        Mood and Affect: Affect normal.          LABORATORY DATA:  I have reviewed the data as listed Lab Results  Component Value Date   WBC 8.5 04/25/2021   HGB 13.0 04/25/2021   HCT 39.3 04/25/2021   MCV 93.8 04/25/2021   PLT 219 04/25/2021   Recent Labs    05/31/20 0904 07/30/20 0759 04/08/21 0902 04/18/21 0843 04/25/21 0829  NA 141   < > 137 136 136  K 4.8   < > 4.0 4.0 3.9  CL 101   < > 105 99 100  CO2 26   < > _0 GLUCOSE 96   < > 98 104* 102*  BUN 16   < > _1 CREATININE 1.15   < > 0.75 0.74 0.85  CALCIUM 9.0   < > 8.7* 8.6* 8.6*  GFRNONAA 62   < > >60 >60 >60  GFRAA 72  --   --   --   --   PROT  --    < > 6.1* 5.9* 5.9*  ALBUMIN  --    < > 3.2* 3.0* 3.0*  AST  --    < > 12* 17 13*  ALT  --    < > 11 39 18  ALKPHOS  --    < > 82 182* 118  BILITOT  --    < > 0.5 0.4 0.8   < > = values in this interval not displayed.    RADIOGRAPHIC STUDIES: I have personally reviewed the radiological images as listed and agreed with the findings in the report.  MR Brain W Wo Contrast  Result Date: 03/26/2021 CLINICAL DATA:  Squamous cell carcinoma of the right lung.  Staging. EXAM: MRI HEAD WITHOUT AND WITH CONTRAST TECHNIQUE: Multiplanar, multiecho pulse sequences of the brain and surrounding structures were obtained without and with intravenous contrast. CONTRAST:  7.88m GADAVIST GADOBUTROL 1 MMOL/ML IV SOLN COMPARISON:  10/16/2020 FINDINGS: Brain: Punctate focus of restricted diffusion at the right parietal vertex, diffusion imaging 40 and FLAIR image 46. This was not present in June, and is most consistent with a punctate cortical infarction. No abnormal enhancement occurs in this location. Elsewhere, the brain shows generalized atrophy with a few old small vessel insults of the  deep white matter. No sign of mass lesion, hemorrhage, hydrocephalus or extra-axial collection. Vascular: Major vessels at the base of the brain show flow. Skull and upper cervical spine: Negative Sinuses/Orbits: Clear/normal Other: None IMPRESSION: Age related atrophy. Minimal chronic small-vessel ischemic change of the white matter. No evidence of metastatic disease. Punctate focus of restricted diffusion and T2/FLAIR signal at the right parietal vertex as described above, most consistent with an incidental tiny cortical infarction. Tiny nonenhancing metastasis is not excluded but would be less likely. Electronically Signed   By: MNelson ChimesM.D.   On: 03/26/2021 10:42   NM PET Image Restag (PS) Skull Base To Thigh  Result Date: 02/13/2021 CLINICAL DATA:  Subsequent treatment strategy for non-small cell lung cancer. Previous radiation therapy. Findings suspicious for local recurrence on recent chest CT EXAM: NUCLEAR MEDICINE PET SKULL BASE TO THIGH TECHNIQUE: 9.4 mCi F-18 FDG was injected intravenously. Full-ring PET imaging was performed from the skull base to thigh after the radiotracer. CT data was obtained and used for attenuation correction and anatomic localization. Fasting blood glucose: 111 mg/dl COMPARISON:  CT chest 01/24/2021 and 07/30/2020.  PET-CT 03/10/2019. FINDINGS: Mediastinal blood pool activity: SUV max 1.5 NECK: No hypermetabolic cervical lymph nodes are identified.There are no lesions of the pharyngeal mucosal space. Incidental CT findings: Left carotid stent noted. CHEST: The recurrent right suprahilar mass demonstrates peripheral hypermetabolic activity which is greatest medially (SUV max 7.6), consistent with local recurrence. This measures approximately 3.4 x 3.2 cm on image 94/3. There is contiguous right paratracheal hypermetabolic adenopathy. The mildly prominent subcarinal node seen on the recent CT demonstrates no hypermetabolic activity. The superior extent of the pulmonary  component demonstrates no significant metabolic activity. There is no peripheral hypermetabolic pulmonary activity. Specifically, no hypermetabolic activity is demonstrated associated with the increased nodular septal thickening in the left lower lobe. Incidental CT findings: Atherosclerosis of the aorta, great vessels and coronary arteries. No significant pleural or pericardial effusion. Radiation changes in the central right upper lobe with left lower lobe scarring. No suspicious peripheral pulmonary nodules. ABDOMEN/PELVIS: There is no hypermetabolic activity within the liver, adrenal glands, spleen or pancreas. There is no hypermetabolic nodal activity. There are apparent postsurgical changes in the right inguinal region with associated low level hypermetabolic activity (SUV max 2.7). Incidental CT findings: Severe right renal atrophy with cortical thinning. Diffuse aortic and branch vessel atherosclerosis, prior cholecystectomy, probable left periaortic lymphangioma (2.0 cm on image 173/3), periampullary duodenal diverticulum and moderate enlargement of the prostate gland are again noted. There is a right inguinal hernia containing fat and an associated small right scrotal hydrocele. SKELETON: There is no hypermetabolic activity to suggest osseous metastatic disease. Incidental CT findings: none IMPRESSION: 1. Hypermetabolic central right upper lobe/perihilar recurrence with adjacent contiguous right paratracheal adenopathy. 2. No distant or progressive metastases identified. No suspicious  peripheral lung hypermetabolic activity. 3. Low level hypermetabolic activity in the right groin associated with apparent postsurgical changes, presumably from recent carotid stent placement. 4. Coronary and Aortic Atherosclerosis (ICD10-I70.0). Electronically Signed   By: Richardean Sale M.D.   On: 02/13/2021 09:39   DG Chest Port 1 View  Result Date: 03/06/2021 CLINICAL DATA:  Status post bronchoscopy EXAM: PORTABLE  CHEST 1 VIEW COMPARISON:  Chest radiograph from 07/17/2017, CT chest 01/24/2021 FINDINGS: The cardiomediastinal silhouette is normal. Is peculiar did mass is seen in the right suprahilar region with surrounding architectural distortion as seen on the recent CT chest. There is no evidence of pneumothorax following bronchoscopy. The lungs are otherwise clear. There is no pleural effusion. There is no acute osseous abnormality. IMPRESSION: 1. No evidence of pneumothorax following bronchoscopy. 2. Right upper lobe mass as seen on prior chest CT. Electronically Signed   By: Valetta Mole M.D.   On: 03/06/2021 14:28   IR IMAGING GUIDED PORT INSERTION  Result Date: 03/29/2021 INDICATION: RIGHT lung cancer. EXAM: IMPLANTED PORT A CATH PLACEMENT WITH ULTRASOUND AND FLUOROSCOPIC GUIDANCE MEDICATIONS: None ANESTHESIA/SEDATION: Moderate (conscious) sedation was employed during this procedure. A total of Versed 1 mg and Fentanyl 50 mcg was administered intravenously. Moderate Sedation Time: 26 minutes. The patient's level of consciousness and vital signs were monitored continuously by radiology nursing throughout the procedure under my direct supervision. FLUOROSCOPY TIME:  0 minutes, 12 seconds (0.9 mGy) COMPLICATIONS: None immediate. PROCEDURE: The procedure, risks, benefits, and alternatives were explained to the patient. Questions regarding the procedure were encouraged and answered. The patient understands and consents to the procedure. The LEFT neck and chest were prepped with chlorhexidine in a sterile fashion, and a sterile drape was applied covering the operative field. Maximum barrier sterile technique with sterile gowns and gloves were used for the procedure. A timeout was performed prior to the initiation of the procedure. Local anesthesia was provided with 1% lidocaine with epinephrine. After creating a small venotomy incision, a micropuncture kit was utilized to access the internal jugular vein under direct,  real-time ultrasound guidance. Ultrasound image documentation was performed. The microwire was kinked to measure appropriate catheter length. A subcutaneous port pocket was then created along the upper chest wall utilizing a combination of sharp and blunt dissection. The pocket was irrigated with sterile saline. A single lumen ISP power injectable port was chosen for placement. The 8 Fr catheter was tunneled from the port pocket site to the venotomy incision. The port was placed in the pocket. The external catheter was trimmed to appropriate length. At the venotomy, an 8 Fr peel-away sheath was placed over a guidewire under fluoroscopic guidance. The catheter was then placed through the sheath and the sheath was removed. Final catheter positioning was confirmed and documented with a fluoroscopic spot radiograph. The port was accessed with a Huber needle, aspirated and flushed with heparinized saline. The port pocket incision was closed with interrupted 3-0 Vicryl suture then Dermabond was applied, including at the venotomy incision. Dressings were placed. The patient tolerated the procedure well without immediate post procedural complication. IMPRESSION: Successful placement of a LEFT internal jugular approach power injectable Port-A-Cath. The tip of the catheter is located at the proximal RIGHT atrium. The catheter is ready for immediate use. Michaelle Birks, MD [facility] Electronically Signed   By: Michaelle Birks M.D.   On: 03/29/2021 11:58      ASSESSMENT & PLAN:  1. Encounter for antineoplastic chemotherapy   2.  Malignant neoplasm of bronchus and lung ([place])   3. Squamous cell carcinoma of larynx (HCC)   4. Kidney lesion    Cancer Staging  Lung cancer [facility]) Staging form: Lung, AJCC 8th Edition - Clinical: Stage IIIA (rcT4, cN1, cM0) - Signed by Earlie Server, MD on 02/13/2021  #Presumed lung cancer, status post SBRT of left lung nodule in 2019 and  SBRP of right upper lobe nodule . 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  Labs are reviewed and discussed with patient. Proceed with with cycle 4 carboplatin.  Continue RT  # Squamous cell carcinoma of larynx, recommend he continue to have annual check up with ENT Dr.McQueen.  #Right kidney lesion, not well characterized on PET scan.   will obtain MRI renal protocol for further evaluation of the kidney lesion once he finishes treatment for lung cancer..  # Malnutrition, encourage him to take ensure supplements. Follow up with nutritionist.   We spent sufficient time to discuss many aspect of care, questions were answered to patient's satisfaction. Follow-up in 1 week for lab MD, Carboplatin  Earlie Server, MD, PhD  05/02/2021

## 2021-05-03 ENCOUNTER — Ambulatory Visit
Admission: RE | Admit: 2021-05-03 | Discharge: 2021-05-03 | Disposition: A | Discharge status: Home / Self Care | Payer: Medicare Other | Source: Ambulatory Visit | Attending: Radiation Oncology | Admitting: Radiation Oncology

## 2021-05-03 DIAGNOSIS — C3411 Malignant neoplasm of upper lobe, right bronchus or lung: Secondary | ICD-10-CM | POA: Diagnosis not present

## 2021-05-03 DIAGNOSIS — N189 Chronic kidney disease, unspecified: Secondary | ICD-10-CM | POA: Diagnosis not present

## 2021-05-03 DIAGNOSIS — Z5111 Encounter for antineoplastic chemotherapy: Secondary | ICD-10-CM | POA: Diagnosis not present

## 2021-05-03 DIAGNOSIS — K4090 Unilateral inguinal hernia, without obstruction or gangrene, not specified as recurrent: Secondary | ICD-10-CM | POA: Diagnosis not present

## 2021-05-03 DIAGNOSIS — F17210 Nicotine dependence, cigarettes, uncomplicated: Secondary | ICD-10-CM | POA: Diagnosis not present

## 2021-05-03 DIAGNOSIS — R590 Localized enlarged lymph nodes: Secondary | ICD-10-CM | POA: Diagnosis not present

## 2021-05-03 DIAGNOSIS — Z510 Encounter for antineoplastic radiation therapy: Secondary | ICD-10-CM | POA: Diagnosis not present

## 2021-05-03 DIAGNOSIS — E1122 Type 2 diabetes mellitus with diabetic chronic kidney disease: Secondary | ICD-10-CM | POA: Diagnosis not present

## 2021-05-03 DIAGNOSIS — I2510 Atherosclerotic heart disease of native coronary artery without angina pectoris: Secondary | ICD-10-CM | POA: Diagnosis not present

## 2021-05-03 DIAGNOSIS — I130 Hypertensive heart and chronic kidney disease with heart failure and stage 1 through stage 4 chronic kidney disease, or unspecified chronic kidney disease: Secondary | ICD-10-CM | POA: Diagnosis not present

## 2021-05-03 DIAGNOSIS — C329 Malignant neoplasm of larynx, unspecified: Secondary | ICD-10-CM | POA: Diagnosis not present

## 2021-05-06 ENCOUNTER — Ambulatory Visit
Admission: RE | Admit: 2021-05-06 | Discharge: 2021-05-06 | Disposition: A | Discharge status: Home / Self Care | Payer: Medicare Other | Source: Ambulatory Visit | Attending: Radiation Oncology | Admitting: Radiation Oncology

## 2021-05-06 DIAGNOSIS — C329 Malignant neoplasm of larynx, unspecified: Secondary | ICD-10-CM | POA: Diagnosis not present

## 2021-05-06 DIAGNOSIS — F17210 Nicotine dependence, cigarettes, uncomplicated: Secondary | ICD-10-CM | POA: Diagnosis not present

## 2021-05-06 DIAGNOSIS — Z5111 Encounter for antineoplastic chemotherapy: Secondary | ICD-10-CM | POA: Diagnosis not present

## 2021-05-06 DIAGNOSIS — I130 Hypertensive heart and chronic kidney disease with heart failure and stage 1 through stage 4 chronic kidney disease, or unspecified chronic kidney disease: Secondary | ICD-10-CM | POA: Diagnosis not present

## 2021-05-06 DIAGNOSIS — E1122 Type 2 diabetes mellitus with diabetic chronic kidney disease: Secondary | ICD-10-CM | POA: Diagnosis not present

## 2021-05-06 DIAGNOSIS — N189 Chronic kidney disease, unspecified: Secondary | ICD-10-CM | POA: Diagnosis not present

## 2021-05-06 DIAGNOSIS — R590 Localized enlarged lymph nodes: Secondary | ICD-10-CM | POA: Diagnosis not present

## 2021-05-06 DIAGNOSIS — Z510 Encounter for antineoplastic radiation therapy: Secondary | ICD-10-CM | POA: Diagnosis not present

## 2021-05-06 DIAGNOSIS — C3411 Malignant neoplasm of upper lobe, right bronchus or lung: Secondary | ICD-10-CM | POA: Diagnosis not present

## 2021-05-06 DIAGNOSIS — K4090 Unilateral inguinal hernia, without obstruction or gangrene, not specified as recurrent: Secondary | ICD-10-CM | POA: Diagnosis not present

## 2021-05-06 DIAGNOSIS — I2510 Atherosclerotic heart disease of native coronary artery without angina pectoris: Secondary | ICD-10-CM | POA: Diagnosis not present

## 2021-05-07 ENCOUNTER — Ambulatory Visit
Admission: RE | Admit: 2021-05-07 | Discharge: 2021-05-07 | Disposition: A | Discharge status: Home / Self Care | Payer: Medicare Other | Source: Ambulatory Visit | Attending: Radiation Oncology | Admitting: Radiation Oncology

## 2021-05-07 DIAGNOSIS — I2510 Atherosclerotic heart disease of native coronary artery without angina pectoris: Secondary | ICD-10-CM | POA: Diagnosis not present

## 2021-05-07 DIAGNOSIS — K4090 Unilateral inguinal hernia, without obstruction or gangrene, not specified as recurrent: Secondary | ICD-10-CM | POA: Diagnosis not present

## 2021-05-07 DIAGNOSIS — I130 Hypertensive heart and chronic kidney disease with heart failure and stage 1 through stage 4 chronic kidney disease, or unspecified chronic kidney disease: Secondary | ICD-10-CM | POA: Diagnosis not present

## 2021-05-07 DIAGNOSIS — F17210 Nicotine dependence, cigarettes, uncomplicated: Secondary | ICD-10-CM | POA: Diagnosis not present

## 2021-05-07 DIAGNOSIS — C329 Malignant neoplasm of larynx, unspecified: Secondary | ICD-10-CM | POA: Diagnosis not present

## 2021-05-07 DIAGNOSIS — Z5111 Encounter for antineoplastic chemotherapy: Secondary | ICD-10-CM | POA: Diagnosis not present

## 2021-05-07 DIAGNOSIS — Z510 Encounter for antineoplastic radiation therapy: Secondary | ICD-10-CM | POA: Diagnosis not present

## 2021-05-07 DIAGNOSIS — C3411 Malignant neoplasm of upper lobe, right bronchus or lung: Secondary | ICD-10-CM | POA: Diagnosis not present

## 2021-05-07 DIAGNOSIS — N189 Chronic kidney disease, unspecified: Secondary | ICD-10-CM | POA: Diagnosis not present

## 2021-05-07 DIAGNOSIS — R590 Localized enlarged lymph nodes: Secondary | ICD-10-CM | POA: Diagnosis not present

## 2021-05-07 DIAGNOSIS — E1122 Type 2 diabetes mellitus with diabetic chronic kidney disease: Secondary | ICD-10-CM | POA: Diagnosis not present

## 2021-05-08 ENCOUNTER — Ambulatory Visit
Admission: RE | Admit: 2021-05-08 | Discharge: 2021-05-08 | Disposition: A | Discharge status: Home / Self Care | Payer: Medicare Other | Source: Ambulatory Visit | Attending: Radiation Oncology | Admitting: Radiation Oncology

## 2021-05-08 DIAGNOSIS — K4090 Unilateral inguinal hernia, without obstruction or gangrene, not specified as recurrent: Secondary | ICD-10-CM | POA: Diagnosis not present

## 2021-05-08 DIAGNOSIS — C329 Malignant neoplasm of larynx, unspecified: Secondary | ICD-10-CM | POA: Diagnosis not present

## 2021-05-08 DIAGNOSIS — I130 Hypertensive heart and chronic kidney disease with heart failure and stage 1 through stage 4 chronic kidney disease, or unspecified chronic kidney disease: Secondary | ICD-10-CM | POA: Diagnosis not present

## 2021-05-08 DIAGNOSIS — I2510 Atherosclerotic heart disease of native coronary artery without angina pectoris: Secondary | ICD-10-CM | POA: Diagnosis not present

## 2021-05-08 DIAGNOSIS — E1122 Type 2 diabetes mellitus with diabetic chronic kidney disease: Secondary | ICD-10-CM | POA: Diagnosis not present

## 2021-05-08 DIAGNOSIS — R590 Localized enlarged lymph nodes: Secondary | ICD-10-CM | POA: Diagnosis not present

## 2021-05-08 DIAGNOSIS — Z5111 Encounter for antineoplastic chemotherapy: Secondary | ICD-10-CM | POA: Diagnosis not present

## 2021-05-08 DIAGNOSIS — Z510 Encounter for antineoplastic radiation therapy: Secondary | ICD-10-CM | POA: Diagnosis not present

## 2021-05-08 DIAGNOSIS — F17210 Nicotine dependence, cigarettes, uncomplicated: Secondary | ICD-10-CM | POA: Diagnosis not present

## 2021-05-08 DIAGNOSIS — N189 Chronic kidney disease, unspecified: Secondary | ICD-10-CM | POA: Diagnosis not present

## 2021-05-08 DIAGNOSIS — C3411 Malignant neoplasm of upper lobe, right bronchus or lung: Secondary | ICD-10-CM | POA: Diagnosis not present

## 2021-05-09 ENCOUNTER — Inpatient Hospital Stay (HOSPITAL_BASED_OUTPATIENT_CLINIC_OR_DEPARTMENT_OTHER): Payer: Medicare Other | Admitting: Oncology

## 2021-05-09 ENCOUNTER — Inpatient Hospital Stay: Payer: Medicare Other

## 2021-05-09 ENCOUNTER — Encounter: Payer: Self-pay | Admitting: Oncology

## 2021-05-09 ENCOUNTER — Other Ambulatory Visit: Payer: Self-pay

## 2021-05-09 ENCOUNTER — Ambulatory Visit
Admission: RE | Admit: 2021-05-09 | Discharge: 2021-05-09 | Disposition: A | Discharge status: Home / Self Care | Payer: Medicare Other | Source: Ambulatory Visit | Attending: Radiation Oncology | Admitting: Radiation Oncology

## 2021-05-09 VITALS — Resp 18

## 2021-05-09 VITALS — BP 115/73 | HR 90 | Temp 97.8°F | Wt 161.3 lb

## 2021-05-09 DIAGNOSIS — C3490 Malignant neoplasm of unspecified part of unspecified bronchus or lung: Secondary | ICD-10-CM

## 2021-05-09 DIAGNOSIS — E1122 Type 2 diabetes mellitus with diabetic chronic kidney disease: Secondary | ICD-10-CM | POA: Diagnosis not present

## 2021-05-09 DIAGNOSIS — C3401 Malignant neoplasm of right main bronchus: Secondary | ICD-10-CM

## 2021-05-09 DIAGNOSIS — N289 Disorder of kidney and ureter, unspecified: Secondary | ICD-10-CM | POA: Diagnosis not present

## 2021-05-09 DIAGNOSIS — E8809 Other disorders of plasma-protein metabolism, not elsewhere classified: Secondary | ICD-10-CM | POA: Diagnosis not present

## 2021-05-09 DIAGNOSIS — I130 Hypertensive heart and chronic kidney disease with heart failure and stage 1 through stage 4 chronic kidney disease, or unspecified chronic kidney disease: Secondary | ICD-10-CM | POA: Diagnosis not present

## 2021-05-09 DIAGNOSIS — Z510 Encounter for antineoplastic radiation therapy: Secondary | ICD-10-CM | POA: Diagnosis not present

## 2021-05-09 DIAGNOSIS — C329 Malignant neoplasm of larynx, unspecified: Secondary | ICD-10-CM

## 2021-05-09 DIAGNOSIS — Z5111 Encounter for antineoplastic chemotherapy: Secondary | ICD-10-CM

## 2021-05-09 DIAGNOSIS — F17210 Nicotine dependence, cigarettes, uncomplicated: Secondary | ICD-10-CM | POA: Diagnosis not present

## 2021-05-09 DIAGNOSIS — C3411 Malignant neoplasm of upper lobe, right bronchus or lung: Secondary | ICD-10-CM | POA: Diagnosis not present

## 2021-05-09 DIAGNOSIS — E46 Unspecified protein-calorie malnutrition: Secondary | ICD-10-CM | POA: Diagnosis not present

## 2021-05-09 DIAGNOSIS — N189 Chronic kidney disease, unspecified: Secondary | ICD-10-CM | POA: Diagnosis not present

## 2021-05-09 DIAGNOSIS — K4090 Unilateral inguinal hernia, without obstruction or gangrene, not specified as recurrent: Secondary | ICD-10-CM | POA: Diagnosis not present

## 2021-05-09 DIAGNOSIS — I2510 Atherosclerotic heart disease of native coronary artery without angina pectoris: Secondary | ICD-10-CM | POA: Diagnosis not present

## 2021-05-09 DIAGNOSIS — R590 Localized enlarged lymph nodes: Secondary | ICD-10-CM | POA: Diagnosis not present

## 2021-05-09 LAB — COMPREHENSIVE METABOLIC PANEL
ALT: 12 U/L (ref 0–44)
AST: 13 U/L — ABNORMAL LOW (ref 15–41)
Albumin: 2.8 g/dL — ABNORMAL LOW (ref 3.5–5.0)
Alkaline Phosphatase: 75 U/L (ref 38–126)
Anion gap: 6 (ref 5–15)
BUN: 20 mg/dL (ref 8–23)
CO2: 26 mmol/L (ref 22–32)
Calcium: 8.4 mg/dL — ABNORMAL LOW (ref 8.9–10.3)
Chloride: 103 mmol/L (ref 98–111)
Creatinine, Ser: 0.76 mg/dL (ref 0.61–1.24)
GFR, Estimated: >60 mL/min (ref >60)
Glucose, Bld: 100 mg/dL — ABNORMAL HIGH (ref 70–99)
Potassium: 4.3 mmol/L (ref 3.5–5.1)
Sodium: 135 mmol/L (ref 135–145)
Total Bilirubin: 0.6 mg/dL (ref 0.3–1.2)
Total Protein: 5.9 g/dL — ABNORMAL LOW (ref 6.5–8.1)

## 2021-05-09 LAB — CBC WITH DIFFERENTIAL/PLATELET
Abs Immature Granulocytes: 0.01 10*3/uL (ref 0.00–0.07)
Basophils Absolute: 0 10*3/uL (ref 0.0–0.1)
Basophils Relative: 0 %
Eosinophils Absolute: 0.1 10*3/uL (ref 0.0–0.5)
Eosinophils Relative: 3 %
HCT: 32.3 % — ABNORMAL LOW (ref 39.0–52.0)
Hemoglobin: 10.7 g/dL — ABNORMAL LOW (ref 13.0–17.0)
Immature Granulocytes: 0 %
Lymphocytes Relative: 10 %
Lymphs Abs: 0.5 10*3/uL — ABNORMAL LOW (ref 0.7–4.0)
MCH: 30.8 pg (ref 26.0–34.0)
MCHC: 33.1 g/dL (ref 30.0–36.0)
MCV: 93.1 fL (ref 80.0–100.0)
Monocytes Absolute: 0.7 10*3/uL (ref 0.1–1.0)
Monocytes Relative: 14 %
Neutro Abs: 3.9 10*3/uL (ref 1.7–7.7)
Neutrophils Relative %: 73 %
Platelets: 127 10*3/uL — ABNORMAL LOW (ref 150–400)
RBC: 3.47 MIL/uL — ABNORMAL LOW (ref 4.22–5.81)
RDW: 13.5 % (ref 11.5–15.5)
WBC: 5.3 10*3/uL (ref 4.0–10.5)
nRBC: 0 % (ref 0.0–0.2)

## 2021-05-09 MED ORDER — HEPARIN SOD (PORK) LOCK FLUSH 100 UNIT/ML IV SOLN
INTRAVENOUS | Status: AC
  Administered 2021-05-09: 500 [IU]
  Filled 2021-05-09: qty 5

## 2021-05-09 MED ORDER — FAMOTIDINE IN NACL 20-0.9 MG/50ML-% IV SOLN
20.0000 mg | Freq: Once | INTRAVENOUS | Status: AC
  Administered 2021-05-09: 20 mg via INTRAVENOUS
  Filled 2021-05-09: qty 50

## 2021-05-09 MED ORDER — SODIUM CHLORIDE 0.9 % IV SOLN
187.8000 mg | Freq: Once | INTRAVENOUS | Status: AC
  Administered 2021-05-09: 190 mg via INTRAVENOUS
  Filled 2021-05-09: qty 19

## 2021-05-09 MED ORDER — SODIUM CHLORIDE 0.9% FLUSH
10.0000 mL | INTRAVENOUS | Status: DC | PRN
  Administered 2021-05-09: 10 mL
  Filled 2021-05-09: qty 10

## 2021-05-09 MED ORDER — HEPARIN SOD (PORK) LOCK FLUSH 100 UNIT/ML IV SOLN
500.0000 [IU] | Freq: Once | INTRAVENOUS | Status: AC | PRN
  Filled 2021-05-09: qty 5

## 2021-05-09 MED ORDER — DIPHENHYDRAMINE HCL 50 MG/ML IJ SOLN
50.0000 mg | Freq: Once | INTRAMUSCULAR | Status: AC
  Administered 2021-05-09: 50 mg via INTRAVENOUS
  Filled 2021-05-09: qty 1

## 2021-05-09 MED ORDER — SODIUM CHLORIDE 0.9 % IV SOLN
Freq: Once | INTRAVENOUS | Status: AC
  Filled 2021-05-09: qty 250

## 2021-05-09 MED ORDER — PALONOSETRON HCL INJECTION 0.25 MG/5ML
0.2500 mg | Freq: Once | INTRAVENOUS | Status: AC
  Administered 2021-05-09: 0.25 mg via INTRAVENOUS
  Filled 2021-05-09: qty 5

## 2021-05-09 MED ORDER — SODIUM CHLORIDE 0.9 % IV SOLN
10.0000 mg | Freq: Once | INTRAVENOUS | Status: AC
  Administered 2021-05-09: 10 mg via INTRAVENOUS
  Filled 2021-05-09: qty 10

## 2021-05-09 NOTE — Progress Notes (Signed)
Nutrition Follow-up:   Patient with recurrent lung cancer.  Patient receiving concurrent radiation and chemotherapy.    Met with patient during infusion.  Reports no appetite.  Yesterday able to eat ham, egg biscuit before treatment in the am and then another one for lunch.  Last night ate canned peaches.  Tries to drink 2 ensure shakes a day.  Likes vanilla.  Says that he had diarrhea for 2 days but this has resolved.  Can't eat meat right now per patient.      Medications: remeron 7.5 mg since 12/2020  Labs: glucose 100, albumin 2.8  Anthropometrics:   Weight 161 lb 4.8 oz today  166 lb on 1/5 173 lb on 9/15    NUTRITION DIAGNOSIS: Inadequate oral intake continues    INTERVENTION:  Consider increasing dose of remeron or another appetite stimulant. Message sent to provider. Encouraged patient to increase ensure shakes to TID, unsure if he could drink more than 2 a day.  Complimentary case of ensure given to patient today Discussed easy to prepare meals (ie frozen dinners) Discussed other sources of protein as patient not eating much meat.    MONITORING, EVALUATION, GOAL: weight trends, intake   NEXT VISIT: phone call on Thursday, Feb 9th  Tehila Sokolow B. Zenia Resides, [place] [place] Registered Dietitian [phone] (mobile)

## 2021-05-09 NOTE — Progress Notes (Signed)
Hematology/Oncology Follow up note [phone] [phone]   Patient Care Team: Venita Lick, NP as PCP - General (Nurse Practitioner) Wellington Hampshire, MD as PCP - Cardiology (Cardiology) Beverly Gust, MD (Otolaryngology) Telford Nab, RN as Registered Nurse [name], MD as Radiation Oncologist (Radiation Oncology)  REFERRING PROVIDER: Venita Lick, NP REASON FOR VISIT Follow up for stage III squamous cell carcinoma.  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  He got definitive radiation to the larynx, and the patient has been doing annual checkup with ENT Dr. Tami Ribas.  # 09/02/2017 ENB biopsy of both lung nodule. Pathology of biopsies showed no lesional cells identified. However he is a high risk patient and negative biopsy results do not exclude malignancy.  May 2019 Patient underwent SBRT to the left lung lesion.  # 02/25/2019 CT scan showed right upper lobe nodule interval increase in size.  #03/2019 he finished SBRT to right upper lobe lesion.   01/24/2021, CT with contrast showed progressive right upper lobe malignancy, while separated from the site of regional right upper lobe lesion.  2 masslike areas, including the more central of these which directly invades the mediastinum, with potential direct tracheal invasion.  Progressive right hilar and subcarinal lymphadenopathy.  Left lower lobe at the site of previously noted left lower lobe lesion, there has been progressive septal thickening and nodularity which has slowly increased over numerous prior examinations.  Highly concerning for locally recurrent disease.  Aortic atherosclerosis, in addition to  three-vessel coronary artery disease.  Calcification of aortic valve.  1.7 cm lesion in the posterior aspect of the interpolar region of the right kidney.  Suspicious for small neoplasm.  02/11/2021, PET scan showed hypermetabolic central right upper lobe/perihilar recurrence with adjacent contiguous right paratracheal adenopathy.  No distal or progressive metastasis identified.  Low-level hypermetabolic activity in the right groin associated with it.  Postsurgical changes.  Coronary and aortic atherosclerosis.  #03/06/2021 status post biopsy via bronchoscopy. Pathology showed right mainstem bronchus positive for squamous cell carcinoma. Right mainstem bronchus brushings positive for SCC, lavage was suspicious for malignancy. Subcarinal lymph node negative.  precarinal lymph node negative.  03/26/2021 MRI brain with and without contrast showed age-related atrophy.  Minimal chronic small vessel ischemic changes.  No evidence of metastatic disease.  Incidental tiny cortical infarction.  04/08/2021 concurrent chemotherapy + RT Cycle 1 weekly carboplatin and Taxol.  04/18/2021 Analylatic reaction to Taxol, ER visit for observation.  04/25/2020 carboplatin single agent.    INTERVAL HISTORY Philip Wood is a 76 y.o. male who has above history reviewed by me today presents for follow-up visit for management of recurrent stage III squamous cell carcinoma Patient is currently on concurrent chemotherapy and radiation with single agent carboplatin.  Patient reports feeling well today.  Appetite is not good.  He drinks nutrition supplements twice daily.  Denies any nausea vomiting diarrhea, fever or chills.  Denies any indigestion/heartburn.  Review of Systems  Constitutional:  Positive for appetite change. Negative for chills, fatigue, fever and unexpected weight change.  HENT:   Negative for hearing loss and voice change.   Eyes:  Negative for eye problems and icterus.  Respiratory:  Positive for  shortness  of breath. Negative for chest tightness and cough.   Cardiovascular:  Negative for chest pain and leg swelling.  Gastrointestinal:  Negative for abdominal distention and abdominal pain.  Endocrine: Negative for hot flashes.  Genitourinary:  Negative for difficulty urinating, dysuria and frequency.   Musculoskeletal:  Negative for arthralgias.  Skin:  Negative for itching and rash.  Neurological:  Negative for light-headedness and numbness.  Hematological:  Negative for adenopathy. Does not bruise/bleed easily.  Psychiatric/Behavioral:  Negative for confusion.      MEDICAL HISTORY:  Past Medical History:  Diagnosis Date   Arthritis    Bruit of left carotid artery    Cancer ([place])    laryngeal   Chronic heart failure (HCC)    Chronic kidney disease    COPD (chronic obstructive pulmonary disease) (HCC)    Coronary artery disease    Diabetes mellitus without complication (HCC)    GERD (gastroesophageal reflux disease)    Headache    History of kidney stones    Hyperlipidemia    Hypertension    Lumbago    Neuropathy    Tobacco abuse disorder     SURGICAL HISTORY: Past Surgical History:  Procedure Laterality Date   ARTERY BIOPSY Right 11/14/2020   Procedure: BIOPSY TEMPORAL ARTERY;  Surgeon: Katha Cabal, MD;  Location: [facility];  Service: Vascular;  Laterality: Right;   CAROTID PTA/STENT INTERVENTION Left 01/30/2021   Procedure: CAROTID PTA/STENT INTERVENTION;  Surgeon: Katha Cabal, MD;  Location: [facility];  Service: Cardiovascular;  Laterality: Left;   CHOLECYSTECTOMY     ELECTROMAGNETIC NAVIGATION BROCHOSCOPY N/A 08/19/2017   Procedure: ELECTROMAGNETIC NAVIGATION BRONCHOSCOPY;  Surgeon: Flora Lipps, MD;  Location: [facility];  Service: Cardiopulmonary;  Laterality: N/A;   ELECTROMAGNETIC NAVIGATION BROCHOSCOPY Left 09/02/2017   Procedure: ELECTROMAGNETIC NAVIGATION BRONCHOSCOPY;  Surgeon: Flora Lipps, MD;  Location: [facility];  Service:  Cardiopulmonary;  Laterality: Left;   IR IMAGING GUIDED PORT INSERTION  03/29/2021   THROAT SURGERY     VIDEO BRONCHOSCOPY WITH ENDOBRONCHIAL ULTRASOUND N/A 03/06/2021   Procedure: VIDEO BRONCHOSCOPY WITH ENDOBRONCHIAL ULTRASOUND;  Surgeon: Tyler Pita, MD;  Location: [facility];  Service: Cardiopulmonary;  Laterality: N/A;    SOCIAL HISTORY: Social History   Socioeconomic History   Marital status: Widowed    Spouse name: Not on file   Number of children: Not on file   Years of education: Not on file   Highest education level: 10th grade  Occupational History   Not on file  Tobacco Use   Smoking status: Light Smoker    Packs/day: 0.50    Years: 53.00    Pack years: 26.50    Types: Cigarettes   Smokeless tobacco: Never   Tobacco comments:    Aprrox 10 cigs/day--02/21/2021  Vaping Use   Vaping Use: Never used  Substance and Sexual Activity   Alcohol use: No    Alcohol/week: 0.0 standard drinks   Drug use: No   Sexual activity: Not Currently  Other Topics Concern   Not on file  Social History Narrative   Works part time at [employer] retired   Investment banker, operational of Radio broadcast assistant Strain: Low Risk    Difficulty of Paying Living Expenses: Not hard at all  Food Insecurity: No Food Insecurity   Worried About Charity fundraiser in the Last Year: Never true   Arboriculturist in the Last Year: Never true  Transportation Needs: No Transportation Needs   Lack of  Transportation (Medical): No   Lack of Transportation (Non-Medical): No  Physical Activity: Inactive   Days of Exercise per Week: 0 days   Minutes of Exercise per Session: 0 min  Stress: No Stress Concern Present   Feeling of Stress : Not at all  Social Connections: Not on file  Intimate Partner Violence: Not on file    FAMILY HISTORY: Family History  Problem Relation Age of Onset   Heart disease Father    Heart attack Father    Brain cancer Sister    Cervical cancer Sister    Stomach cancer  Sister    Stomach cancer Sister    Diabetes Sister    Lupus Sister    Rectal cancer Brother    Lung cancer Brother    Lung cancer Brother    Lung cancer Brother    Lung cancer Brother     ALLERGIES:  is allergic to paclitaxel.  MEDICATIONS:  Current Outpatient Medications  Medication Sig Dispense Refill   acetaminophen (TYLENOL) 650 MG CR tablet Take 1,300 mg by mouth every 8 (eight) hours as needed for pain.     albuterol (VENTOLIN HFA) 108 (90 Base) MCG/ACT inhaler Inhale 2 puffs into the lungs every 6 (six) hours as needed for wheezing or shortness of breath. 8 g 2   allopurinol (ZYLOPRIM) 100 MG tablet Take 1 tablet (100 mg total) by mouth daily. (Patient taking differently: Take 100 mg by mouth in the morning.) 90 tablet 4   aspirin EC 81 MG tablet Take 81 mg by mouth in the morning. Swallow whole.     atorvastatin (LIPITOR) 80 MG tablet Take 1 tablet (80 mg total) by mouth daily at 6 PM. 90 tablet 4   clopidogrel (PLAVIX) 75 MG tablet Take 1 tablet (75 mg total) by mouth daily. 30 tablet 11   diphenhydrAMINE (BENADRYL ALLERGY) 25 MG tablet Take 1 tablet (25 mg total) by mouth every 6 (six) hours as needed. 30 tablet 0   Ensure (ENSURE) Take 237 mLs by mouth daily.     lidocaine-prilocaine (EMLA) cream Apply to affected area once 30 g 3   metFORMIN (GLUCOPHAGE) 500 MG tablet Take 1 tablet (500 mg total) by mouth 2 (two) times daily with a meal. 180 tablet 4   metoprolol succinate (TOPROL XL) 25 MG 24 hr tablet Take 1 tablet (25 mg total) by mouth daily. (Patient taking differently: Take 25 mg by mouth in the morning.) 90 tablet 4   mirtazapine (REMERON) 7.5 MG tablet Take 1 tablet (7.5 mg total) by mouth at bedtime. 90 tablet 4   prochlorperazine (COMPAZINE) 10 MG tablet Take 1 tablet (10 mg total) by mouth every 6 (six) hours as needed (Nausea or vomiting). 30 tablet 1   ramipril (ALTACE) 10 MG capsule Take 1 capsule (10 mg total) by mouth daily. (Patient taking differently: Take  10 mg by mouth in the morning.) 90 capsule 4   tiotropium (SPIRIVA HANDIHALER) 18 MCG inhalation capsule PLACE 1 CAPSULE INTO INHALER AND INHALE ITS CONTENTS ONCE DAILY. (Patient taking differently: Place 18 mcg into inhaler and inhale in the morning.) 90 capsule 4   vitamin B-12 (CYANOCOBALAMIN) 1000 MCG tablet Take 1,000 mcg by mouth daily.     No current facility-administered medications for this visit.   Facility-Administered Medications Ordered in Other Visits  Medication Dose Route Frequency Provider Last Rate Last Admin   0.9 %  sodium chloride infusion   Intravenous Once Earlie Server, MD  CARBOplatin (PARAPLATIN) 190 mg in sodium chloride 0.9 % 100 mL chemo infusion  190 mg Intravenous Once Earlie Server, MD       dexamethasone (DECADRON) 10 mg in sodium chloride 0.9 % 50 mL IVPB  10 mg Intravenous Once Earlie Server, MD       diphenhydrAMINE (BENADRYL) injection 50 mg  50 mg Intravenous Once Earlie Server, MD       famotidine (PEPCID) IVPB 20 mg premix  20 mg Intravenous Once Earlie Server, MD       heparin lock flush 100 unit/mL  500 Units Intracatheter Once PRN Earlie Server, MD       palonosetron (ALOXI) injection 0.25 mg  0.25 mg Intravenous Once Earlie Server, MD         PHYSICAL EXAMINATION: ECOG PERFORMANCE STATUS: 1 - Symptomatic but completely ambulatory Vitals:   05/09/21 0849  BP: 115/73  Pulse: 90  Temp: 97.8 F (36.6 C)    Filed Weights   05/09/21 0849  Weight: 161 lb 4.8 oz (73.2 kg)   Physical Exam Constitutional:      General: He is not in acute distress.    Appearance: He is not diaphoretic.  HENT:     Head: Normocephalic and atraumatic.     Nose: Nose normal.     Mouth/Throat:     Pharynx: No oropharyngeal exudate.  Eyes:     General: No scleral icterus.    Pupils: Pupils are equal, round, and reactive to light.  Cardiovascular:     Rate and Rhythm: Normal rate and regular rhythm.     Heart sounds: No murmur heard. Pulmonary:     Effort: Pulmonary effort is normal. No  respiratory distress.     Breath sounds: No rales.     Comments: Decreased breath sound bilaterally Chest:     Chest wall: No tenderness.  Abdominal:     General: There is no distension.     Palpations: Abdomen is soft.     Tenderness: There is no abdominal tenderness.  Musculoskeletal:        General: Normal range of motion.     Cervical back: Normal range of motion and neck supple.  Skin:    General: Skin is warm and dry.     Findings: No erythema.  Neurological:     Mental Status: He is alert and oriented to person, place, and time.     Cranial Nerves: No cranial nerve deficit.     Motor: No abnormal muscle tone.     Coordination: Coordination normal.  Psychiatric:        Mood and Affect: Affect normal.          LABORATORY DATA:  I have reviewed the data as listed Lab Results  Component Value Date   WBC 5.3 05/09/2021   HGB 10.7 (L) 05/09/2021   HCT 32.3 (L) 05/09/2021   MCV 93.1 05/09/2021   PLT 127 (L) 05/09/2021   Recent Labs    05/31/20 0904 07/30/20 0759 04/25/21 0829 05/02/21 0831 05/09/21 0835  NA 141   < > 136 135 135  K 4.8   < > 3.9 3.9 4.3  CL 101   < > 100 102 103  CO2 26   < > '29 24 26  ' GLUCOSE 96   < > 102* 107* 100*  BUN 16   < > '18 20 20  ' CREATININE 1.15   < > 0.85 0.80 0.76  CALCIUM 9.0   < > 8.6* 8.2* 8.4*  GFRNONAA 62   < > >60 >60 >60  GFRAA 72  --   --   --   --   PROT  --    < > 5.9* 5.8* 5.9*  ALBUMIN  --    < > 3.0* 2.9* 2.8*  AST  --    < > 13* 14* 13*  ALT  --    < > '18 11 12  ' ALKPHOS  --    < > 118 84 75  BILITOT  --    < > 0.8 0.7 0.6   < > = values in this interval not displayed.    RADIOGRAPHIC STUDIES: I have personally reviewed the radiological images as listed and agreed with the findings in the report. MR Brain W Wo Contrast  Result Date: 03/26/2021 CLINICAL DATA:  Squamous cell carcinoma of the right lung.  Staging. EXAM: MRI HEAD WITHOUT AND WITH CONTRAST TECHNIQUE: Multiplanar, multiecho pulse sequences of  the brain and surrounding structures were obtained without and with intravenous contrast. CONTRAST:  7.11m GADAVIST GADOBUTROL 1 MMOL/ML IV SOLN COMPARISON:  10/16/2020 FINDINGS: Brain: Punctate focus of restricted diffusion at the right parietal vertex, diffusion imaging 40 and FLAIR image 46. This was not present in June, and is most consistent with a punctate cortical infarction. No abnormal enhancement occurs in this location. Elsewhere, the brain shows generalized atrophy with a few old small vessel insults of the deep white matter. No sign of mass lesion, hemorrhage, hydrocephalus or extra-axial collection. Vascular: Major vessels at the base of the brain show flow. Skull and upper cervical spine: Negative Sinuses/Orbits: Clear/normal Other: None IMPRESSION: Age related atrophy. Minimal chronic small-vessel ischemic change of the white matter. No evidence of metastatic disease. Punctate focus of restricted diffusion and T2/FLAIR signal at the right parietal vertex as described above, most consistent with an incidental tiny cortical infarction. Tiny nonenhancing metastasis is not excluded but would be less likely. Electronically Signed   By: MNelson ChimesM.D.   On: 03/26/2021 10:42   NM PET Image Restag (PS) Skull Base To Thigh  Result Date: 02/13/2021 CLINICAL DATA:  Subsequent treatment strategy for non-small cell lung cancer. Previous radiation therapy. Findings suspicious for local recurrence on recent chest CT EXAM: NUCLEAR MEDICINE PET SKULL BASE TO THIGH TECHNIQUE: 9.4 mCi F-18 FDG was injected intravenously. Full-ring PET imaging was performed from the skull base to thigh after the radiotracer. CT data was obtained and used for attenuation correction and anatomic localization. Fasting blood glucose: 111 mg/dl COMPARISON:  CT chest 01/24/2021 and 07/30/2020.  PET-CT 03/10/2019. FINDINGS: Mediastinal blood pool activity: SUV max 1.5 NECK: No hypermetabolic cervical lymph nodes are identified.There  are no lesions of the pharyngeal mucosal space. Incidental CT findings: Left carotid stent noted. CHEST: The recurrent right suprahilar mass demonstrates peripheral hypermetabolic activity which is greatest medially (SUV max 7.6), consistent with local recurrence. This measures approximately 3.4 x 3.2 cm on image 94/3. There is contiguous right paratracheal hypermetabolic adenopathy. The mildly prominent subcarinal node seen on the recent CT demonstrates no hypermetabolic activity. The superior extent of the pulmonary component demonstrates no significant metabolic activity. There is no peripheral hypermetabolic pulmonary activity. Specifically, no hypermetabolic activity is demonstrated associated with the increased nodular septal thickening in the left lower lobe. Incidental CT findings: Atherosclerosis of the aorta, great vessels and coronary arteries. No significant pleural or pericardial effusion. Radiation changes in the central right upper lobe with left lower lobe scarring. No suspicious peripheral pulmonary nodules. ABDOMEN/PELVIS: There is  no hypermetabolic activity within the liver, adrenal glands, spleen or pancreas. There is no hypermetabolic nodal activity. There are apparent postsurgical changes in the right inguinal region with associated low level hypermetabolic activity (SUV max 2.7). Incidental CT findings: Severe right renal atrophy with cortical thinning. Diffuse aortic and branch vessel atherosclerosis, prior cholecystectomy, probable left periaortic lymphangioma (2.0 cm on image 173/3), periampullary duodenal diverticulum and moderate enlargement of the prostate gland are again noted. There is a right inguinal hernia containing fat and an associated small right scrotal hydrocele. SKELETON: There is no hypermetabolic activity to suggest osseous metastatic disease. Incidental CT findings: none IMPRESSION: 1. Hypermetabolic central right upper lobe/perihilar recurrence with adjacent contiguous  right paratracheal adenopathy. 2. No distant or progressive metastases identified. No suspicious peripheral lung hypermetabolic activity. 3. Low level hypermetabolic activity in the right groin associated with apparent postsurgical changes, presumably from recent carotid stent placement. 4. Coronary and Aortic Atherosclerosis (ICD10-I70.0). Electronically Signed   By: Richardean Sale M.D.   On: 02/13/2021 09:39   DG Chest Port 1 View  Result Date: 03/06/2021 CLINICAL DATA:  Status post bronchoscopy EXAM: PORTABLE CHEST 1 VIEW COMPARISON:  Chest radiograph from 07/17/2017, CT chest 01/24/2021 FINDINGS: The cardiomediastinal silhouette is normal. Is peculiar did mass is seen in the right suprahilar region with surrounding architectural distortion as seen on the recent CT chest. There is no evidence of pneumothorax following bronchoscopy. The lungs are otherwise clear. There is no pleural effusion. There is no acute osseous abnormality. IMPRESSION: 1. No evidence of pneumothorax following bronchoscopy. 2. Right upper lobe mass as seen on prior chest CT. Electronically Signed   By: Valetta Mole M.D.   On: 03/06/2021 14:28   IR IMAGING GUIDED PORT INSERTION  Result Date: 03/29/2021 INDICATION: RIGHT lung cancer. EXAM: IMPLANTED PORT A CATH PLACEMENT WITH ULTRASOUND AND FLUOROSCOPIC GUIDANCE MEDICATIONS: None ANESTHESIA/SEDATION: Moderate (conscious) sedation was employed during this procedure. A total of Versed 1 mg and Fentanyl 50 mcg was administered intravenously. Moderate Sedation Time: 26 minutes. The patient's level of consciousness and vital signs were monitored continuously by radiology nursing throughout the procedure under my direct supervision. FLUOROSCOPY TIME:  0 minutes, 12 seconds (0.9 mGy) COMPLICATIONS: None immediate. PROCEDURE: The procedure, risks, benefits, and alternatives were explained to the patient. Questions regarding the procedure were encouraged and answered. The patient understands  and consents to the procedure. The LEFT neck and chest were prepped with chlorhexidine in a sterile fashion, and a sterile drape was applied covering the operative field. Maximum barrier sterile technique with sterile gowns and gloves were used for the procedure. A timeout was performed prior to the initiation of the procedure. Local anesthesia was provided with 1% lidocaine with epinephrine. After creating a small venotomy incision, a micropuncture kit was utilized to access the internal jugular vein under direct, real-time ultrasound guidance. Ultrasound image documentation was performed. The microwire was kinked to measure appropriate catheter length. A subcutaneous port pocket was then created along the upper chest wall utilizing a combination of sharp and blunt dissection. The pocket was irrigated with sterile saline. A single lumen ISP power injectable port was chosen for placement. The 8 Fr catheter was tunneled from the port pocket site to the venotomy incision. The port was placed in the pocket. The external catheter was trimmed to appropriate length. At the venotomy, an 8 Fr peel-away sheath was placed over a guidewire under fluoroscopic guidance. The catheter was then placed through the sheath and the sheath was removed. Final catheter positioning  was confirmed and documented with a fluoroscopic spot radiograph. The port was accessed with a Huber needle, aspirated and flushed with heparinized saline. The port pocket incision was closed with interrupted 3-0 Vicryl suture then Dermabond was applied, including at the venotomy incision. Dressings were placed. The patient tolerated the procedure well without immediate post procedural complication. IMPRESSION: Successful placement of a LEFT internal jugular approach power injectable Port-A-Cath. The tip of the catheter is located at the proximal RIGHT atrium. The catheter is ready for immediate use. Michaelle Birks, MD Vascular and Interventional Radiology  [facility] Radiology Electronically Signed   By: Michaelle Birks M.D.   On: 03/29/2021 11:58      ASSESSMENT & PLAN:  1. Encounter for antineoplastic chemotherapy   2. Malignant neoplasm of bronchus and lung ([place])   3. Squamous cell carcinoma of larynx (HCC)   4. Kidney lesion   5. Hypoalbuminemia due to protein-calorie malnutrition ([place])    Cancer Staging  Lung cancer ([place]) Staging form: Lung, AJCC 8th Edition - Clinical: Stage IIIA (rcT4, cN1, cM0) - Signed by Earlie Server, MD on 02/13/2021  #Presumed lung cancer, status post SBRT of left lung nodule in 2019 and SBRP of right upper lobe nodule . 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  Labs reviewed and discussed with patient Proceed with cycle 5 carboplatin today.  Continue radiation.   # Squamous cell carcinoma of larynx, recommend he continue to have annual check up with ENT Dr.McQueen.  #Right kidney lesion, not well characterized on PET scan.   will obtain MRI renal protocol for further evaluation of the kidney lesion once he finishes treatment for lung cancer..  # Malnutrition, continue nutrition supplementation.  We spent sufficient time to discuss many aspect of care, questions were answered to patient's satisfaction. Follow-up in 1 week for lab MD, Carboplatin  Earlie Server, MD, PhD  05/09/2021

## 2021-05-09 NOTE — Patient Instructions (Signed)
[facility] [place]   Discharge Instructions: Thank you for choosing [facility] to provide your oncology and hematology care.  If you have a lab appointment with the [facility], please go directly to the [facility] and check in at the registration area.   Wear comfortable clothing and clothing appropriate for easy access to any Portacath or PICC line.   We strive to give you quality time with your provider. You may need to reschedule your appointment if you arrive late (15 or more minutes).  Arriving late affects you and other patients whose appointments are after yours.  Also, if you miss three or more appointments without notifying the office, you may be dismissed from the clinic at the providers discretion.      For prescription refill requests, have your pharmacy contact our office and allow 72 hours for refills to be completed.    Today you received the following chemotherapy and/or immunotherapy agents: Carboplatin.      To help prevent nausea and vomiting after your treatment, we encourage you to take your nausea medication as directed.  BELOW ARE SYMPTOMS THAT SHOULD BE REPORTED IMMEDIATELY: *FEVER GREATER THAN 100.4 F (38 C) OR HIGHER *CHILLS OR SWEATING *NAUSEA AND VOMITING THAT IS NOT CONTROLLED WITH YOUR NAUSEA MEDICATION *UNUSUAL SHORTNESS OF BREATH *UNUSUAL BRUISING OR BLEEDING *URINARY PROBLEMS (pain or burning when urinating, or frequent urination) *BOWEL PROBLEMS (unusual diarrhea, constipation, pain near the anus) TENDERNESS IN MOUTH AND THROAT WITH OR WITHOUT PRESENCE OF ULCERS (sore throat, sores in mouth, or a toothache) UNUSUAL RASH, SWELLING OR PAIN  UNUSUAL VAGINAL DISCHARGE OR ITCHING   Items with * indicate a potential emergency and should be followed up as soon as possible or go to the Emergency Department if any problems should occur.  Please show the CHEMOTHERAPY ALERT CARD or IMMUNOTHERAPY ALERT CARD at  check-in to the Emergency Department and triage nurse.  Should you have questions after your visit or need to cancel or reschedule your appointment, please contact [facility] [place]  Dept: [phone]  and follow the prompts.  Office hours are 8:00 a.m. to 4:30 p.m. Monday - Friday. Please note that voicemails left after 4:00 p.m. may not be returned until the following business day.  We are closed weekends and major holidays. You have access to a nurse at all times for urgent questions. Please call the main number to the clinic Dept: [phone] and follow the prompts.  For any non-urgent questions, you may also contact your provider using MyChart. We now offer e-Visits for anyone 17 and older to request care online for non-urgent symptoms. For details visit [email or web address].   Also download the MyChart app! Go to the app store, search "MyChart", open the app, select [facility], and log in with your MyChart username and password.  Due to Covid, a mask is required upon entering the hospital/clinic. If you do not have a mask, one will be given to you upon arrival. For doctor visits, patients may have 1 support person aged 81 or older with them. For treatment visits, patients cannot have anyone with them due to current Covid guidelines and our immunocompromised population.

## 2021-05-10 ENCOUNTER — Ambulatory Visit
Admission: RE | Admit: 2021-05-10 | Discharge: 2021-05-10 | Disposition: A | Discharge status: Home / Self Care | Payer: Medicare Other | Source: Ambulatory Visit | Attending: Radiation Oncology | Admitting: Radiation Oncology

## 2021-05-10 DIAGNOSIS — I2510 Atherosclerotic heart disease of native coronary artery without angina pectoris: Secondary | ICD-10-CM | POA: Diagnosis not present

## 2021-05-10 DIAGNOSIS — I130 Hypertensive heart and chronic kidney disease with heart failure and stage 1 through stage 4 chronic kidney disease, or unspecified chronic kidney disease: Secondary | ICD-10-CM | POA: Diagnosis not present

## 2021-05-10 DIAGNOSIS — C3411 Malignant neoplasm of upper lobe, right bronchus or lung: Secondary | ICD-10-CM | POA: Diagnosis not present

## 2021-05-10 DIAGNOSIS — E1122 Type 2 diabetes mellitus with diabetic chronic kidney disease: Secondary | ICD-10-CM | POA: Diagnosis not present

## 2021-05-10 DIAGNOSIS — N189 Chronic kidney disease, unspecified: Secondary | ICD-10-CM | POA: Diagnosis not present

## 2021-05-10 DIAGNOSIS — R590 Localized enlarged lymph nodes: Secondary | ICD-10-CM | POA: Diagnosis not present

## 2021-05-10 DIAGNOSIS — C329 Malignant neoplasm of larynx, unspecified: Secondary | ICD-10-CM | POA: Diagnosis not present

## 2021-05-10 DIAGNOSIS — Z510 Encounter for antineoplastic radiation therapy: Secondary | ICD-10-CM | POA: Diagnosis not present

## 2021-05-10 DIAGNOSIS — Z5111 Encounter for antineoplastic chemotherapy: Secondary | ICD-10-CM | POA: Diagnosis not present

## 2021-05-10 DIAGNOSIS — K4090 Unilateral inguinal hernia, without obstruction or gangrene, not specified as recurrent: Secondary | ICD-10-CM | POA: Diagnosis not present

## 2021-05-10 DIAGNOSIS — F17210 Nicotine dependence, cigarettes, uncomplicated: Secondary | ICD-10-CM | POA: Diagnosis not present

## 2021-05-13 ENCOUNTER — Ambulatory Visit
Admission: RE | Admit: 2021-05-13 | Discharge: 2021-05-13 | Disposition: A | Discharge status: Home / Self Care | Payer: Medicare Other | Source: Ambulatory Visit | Attending: Radiation Oncology | Admitting: Radiation Oncology

## 2021-05-13 DIAGNOSIS — N189 Chronic kidney disease, unspecified: Secondary | ICD-10-CM | POA: Diagnosis not present

## 2021-05-13 DIAGNOSIS — E1122 Type 2 diabetes mellitus with diabetic chronic kidney disease: Secondary | ICD-10-CM | POA: Diagnosis not present

## 2021-05-13 DIAGNOSIS — K4090 Unilateral inguinal hernia, without obstruction or gangrene, not specified as recurrent: Secondary | ICD-10-CM | POA: Diagnosis not present

## 2021-05-13 DIAGNOSIS — I130 Hypertensive heart and chronic kidney disease with heart failure and stage 1 through stage 4 chronic kidney disease, or unspecified chronic kidney disease: Secondary | ICD-10-CM | POA: Diagnosis not present

## 2021-05-13 DIAGNOSIS — I2510 Atherosclerotic heart disease of native coronary artery without angina pectoris: Secondary | ICD-10-CM | POA: Diagnosis not present

## 2021-05-13 DIAGNOSIS — C3411 Malignant neoplasm of upper lobe, right bronchus or lung: Secondary | ICD-10-CM | POA: Diagnosis not present

## 2021-05-13 DIAGNOSIS — C329 Malignant neoplasm of larynx, unspecified: Secondary | ICD-10-CM | POA: Diagnosis not present

## 2021-05-13 DIAGNOSIS — R590 Localized enlarged lymph nodes: Secondary | ICD-10-CM | POA: Diagnosis not present

## 2021-05-13 DIAGNOSIS — Z510 Encounter for antineoplastic radiation therapy: Secondary | ICD-10-CM | POA: Diagnosis not present

## 2021-05-13 DIAGNOSIS — Z5111 Encounter for antineoplastic chemotherapy: Secondary | ICD-10-CM | POA: Diagnosis not present

## 2021-05-13 DIAGNOSIS — F17210 Nicotine dependence, cigarettes, uncomplicated: Secondary | ICD-10-CM | POA: Diagnosis not present

## 2021-05-14 ENCOUNTER — Ambulatory Visit
Admission: RE | Admit: 2021-05-14 | Discharge: 2021-05-14 | Disposition: A | Discharge status: Home / Self Care | Payer: Medicare Other | Source: Ambulatory Visit | Attending: Radiation Oncology | Admitting: Radiation Oncology

## 2021-05-14 DIAGNOSIS — N189 Chronic kidney disease, unspecified: Secondary | ICD-10-CM | POA: Diagnosis not present

## 2021-05-14 DIAGNOSIS — Z510 Encounter for antineoplastic radiation therapy: Secondary | ICD-10-CM | POA: Diagnosis not present

## 2021-05-14 DIAGNOSIS — R590 Localized enlarged lymph nodes: Secondary | ICD-10-CM | POA: Diagnosis not present

## 2021-05-14 DIAGNOSIS — I130 Hypertensive heart and chronic kidney disease with heart failure and stage 1 through stage 4 chronic kidney disease, or unspecified chronic kidney disease: Secondary | ICD-10-CM | POA: Diagnosis not present

## 2021-05-14 DIAGNOSIS — C3411 Malignant neoplasm of upper lobe, right bronchus or lung: Secondary | ICD-10-CM | POA: Diagnosis not present

## 2021-05-14 DIAGNOSIS — F17210 Nicotine dependence, cigarettes, uncomplicated: Secondary | ICD-10-CM | POA: Diagnosis not present

## 2021-05-14 DIAGNOSIS — C329 Malignant neoplasm of larynx, unspecified: Secondary | ICD-10-CM | POA: Diagnosis not present

## 2021-05-14 DIAGNOSIS — I2510 Atherosclerotic heart disease of native coronary artery without angina pectoris: Secondary | ICD-10-CM | POA: Diagnosis not present

## 2021-05-14 DIAGNOSIS — E1122 Type 2 diabetes mellitus with diabetic chronic kidney disease: Secondary | ICD-10-CM | POA: Diagnosis not present

## 2021-05-14 DIAGNOSIS — K4090 Unilateral inguinal hernia, without obstruction or gangrene, not specified as recurrent: Secondary | ICD-10-CM | POA: Diagnosis not present

## 2021-05-14 DIAGNOSIS — Z5111 Encounter for antineoplastic chemotherapy: Secondary | ICD-10-CM | POA: Diagnosis not present

## 2021-05-15 ENCOUNTER — Ambulatory Visit
Admission: RE | Admit: 2021-05-15 | Discharge: 2021-05-15 | Disposition: A | Discharge status: Home / Self Care | Payer: Medicare Other | Source: Ambulatory Visit | Attending: Radiation Oncology | Admitting: Radiation Oncology

## 2021-05-15 DIAGNOSIS — Z510 Encounter for antineoplastic radiation therapy: Secondary | ICD-10-CM | POA: Diagnosis not present

## 2021-05-15 DIAGNOSIS — F17210 Nicotine dependence, cigarettes, uncomplicated: Secondary | ICD-10-CM | POA: Diagnosis not present

## 2021-05-15 DIAGNOSIS — C3411 Malignant neoplasm of upper lobe, right bronchus or lung: Secondary | ICD-10-CM | POA: Diagnosis not present

## 2021-05-15 DIAGNOSIS — K4090 Unilateral inguinal hernia, without obstruction or gangrene, not specified as recurrent: Secondary | ICD-10-CM | POA: Diagnosis not present

## 2021-05-15 DIAGNOSIS — C329 Malignant neoplasm of larynx, unspecified: Secondary | ICD-10-CM | POA: Diagnosis not present

## 2021-05-15 DIAGNOSIS — Z5111 Encounter for antineoplastic chemotherapy: Secondary | ICD-10-CM | POA: Diagnosis not present

## 2021-05-15 DIAGNOSIS — I130 Hypertensive heart and chronic kidney disease with heart failure and stage 1 through stage 4 chronic kidney disease, or unspecified chronic kidney disease: Secondary | ICD-10-CM | POA: Diagnosis not present

## 2021-05-15 DIAGNOSIS — E1122 Type 2 diabetes mellitus with diabetic chronic kidney disease: Secondary | ICD-10-CM | POA: Diagnosis not present

## 2021-05-15 DIAGNOSIS — I2510 Atherosclerotic heart disease of native coronary artery without angina pectoris: Secondary | ICD-10-CM | POA: Diagnosis not present

## 2021-05-15 DIAGNOSIS — N189 Chronic kidney disease, unspecified: Secondary | ICD-10-CM | POA: Diagnosis not present

## 2021-05-15 DIAGNOSIS — R590 Localized enlarged lymph nodes: Secondary | ICD-10-CM | POA: Diagnosis not present

## 2021-05-16 ENCOUNTER — Ambulatory Visit
Admission: RE | Admit: 2021-05-16 | Discharge: 2021-05-16 | Disposition: A | Discharge status: Home / Self Care | Payer: Medicare Other | Source: Ambulatory Visit | Attending: Radiation Oncology | Admitting: Radiation Oncology

## 2021-05-16 ENCOUNTER — Inpatient Hospital Stay: Payer: Medicare Other

## 2021-05-16 ENCOUNTER — Inpatient Hospital Stay (HOSPITAL_BASED_OUTPATIENT_CLINIC_OR_DEPARTMENT_OTHER): Payer: Medicare Other | Admitting: Oncology

## 2021-05-16 ENCOUNTER — Encounter: Payer: Self-pay | Admitting: Oncology

## 2021-05-16 ENCOUNTER — Other Ambulatory Visit: Payer: Self-pay

## 2021-05-16 VITALS — BP 103/62 | HR 93 | Temp 97.9°F | Resp 16 | Wt 159.9 lb

## 2021-05-16 DIAGNOSIS — F17210 Nicotine dependence, cigarettes, uncomplicated: Secondary | ICD-10-CM | POA: Diagnosis not present

## 2021-05-16 DIAGNOSIS — C329 Malignant neoplasm of larynx, unspecified: Secondary | ICD-10-CM

## 2021-05-16 DIAGNOSIS — N289 Disorder of kidney and ureter, unspecified: Secondary | ICD-10-CM

## 2021-05-16 DIAGNOSIS — Z5111 Encounter for antineoplastic chemotherapy: Secondary | ICD-10-CM

## 2021-05-16 DIAGNOSIS — I130 Hypertensive heart and chronic kidney disease with heart failure and stage 1 through stage 4 chronic kidney disease, or unspecified chronic kidney disease: Secondary | ICD-10-CM | POA: Diagnosis not present

## 2021-05-16 DIAGNOSIS — R590 Localized enlarged lymph nodes: Secondary | ICD-10-CM | POA: Diagnosis not present

## 2021-05-16 DIAGNOSIS — E1122 Type 2 diabetes mellitus with diabetic chronic kidney disease: Secondary | ICD-10-CM | POA: Diagnosis not present

## 2021-05-16 DIAGNOSIS — C3401 Malignant neoplasm of right main bronchus: Secondary | ICD-10-CM

## 2021-05-16 DIAGNOSIS — N189 Chronic kidney disease, unspecified: Secondary | ICD-10-CM | POA: Diagnosis not present

## 2021-05-16 DIAGNOSIS — K4090 Unilateral inguinal hernia, without obstruction or gangrene, not specified as recurrent: Secondary | ICD-10-CM | POA: Diagnosis not present

## 2021-05-16 DIAGNOSIS — I2510 Atherosclerotic heart disease of native coronary artery without angina pectoris: Secondary | ICD-10-CM | POA: Diagnosis not present

## 2021-05-16 DIAGNOSIS — C3490 Malignant neoplasm of unspecified part of unspecified bronchus or lung: Secondary | ICD-10-CM

## 2021-05-16 DIAGNOSIS — C3411 Malignant neoplasm of upper lobe, right bronchus or lung: Secondary | ICD-10-CM | POA: Diagnosis not present

## 2021-05-16 DIAGNOSIS — Z510 Encounter for antineoplastic radiation therapy: Secondary | ICD-10-CM | POA: Diagnosis not present

## 2021-05-16 LAB — COMPREHENSIVE METABOLIC PANEL
ALT: 13 U/L (ref 0–44)
AST: 18 U/L (ref 15–41)
Albumin: 2.6 g/dL — ABNORMAL LOW (ref 3.5–5.0)
Alkaline Phosphatase: 81 U/L (ref 38–126)
Anion gap: 13 (ref 5–15)
BUN: 25 mg/dL — ABNORMAL HIGH (ref 8–23)
CO2: 25 mmol/L (ref 22–32)
Calcium: 8.9 mg/dL (ref 8.9–10.3)
Chloride: 97 mmol/L — ABNORMAL LOW (ref 98–111)
Creatinine, Ser: 0.8 mg/dL (ref 0.61–1.24)
GFR, Estimated: >60 mL/min (ref >60)
Glucose, Bld: 160 mg/dL — ABNORMAL HIGH (ref 70–99)
Potassium: 4.2 mmol/L (ref 3.5–5.1)
Sodium: 135 mmol/L (ref 135–145)
Total Bilirubin: 0.6 mg/dL (ref 0.3–1.2)
Total Protein: 6 g/dL — ABNORMAL LOW (ref 6.5–8.1)

## 2021-05-16 LAB — CBC WITH DIFFERENTIAL/PLATELET
Abs Immature Granulocytes: 0.02 10*3/uL (ref 0.00–0.07)
Basophils Absolute: 0 10*3/uL (ref 0.0–0.1)
Basophils Relative: 0 %
Eosinophils Absolute: 0.1 10*3/uL (ref 0.0–0.5)
Eosinophils Relative: 1 %
HCT: 30.7 % — ABNORMAL LOW (ref 39.0–52.0)
Hemoglobin: 10.3 g/dL — ABNORMAL LOW (ref 13.0–17.0)
Immature Granulocytes: 0 %
Lymphocytes Relative: 10 %
Lymphs Abs: 0.5 10*3/uL — ABNORMAL LOW (ref 0.7–4.0)
MCH: 31 pg (ref 26.0–34.0)
MCHC: 33.6 g/dL (ref 30.0–36.0)
MCV: 92.5 fL (ref 80.0–100.0)
Monocytes Absolute: 1 10*3/uL (ref 0.1–1.0)
Monocytes Relative: 18 %
Neutro Abs: 3.8 10*3/uL (ref 1.7–7.7)
Neutrophils Relative %: 71 %
Platelets: 165 10*3/uL (ref 150–400)
RBC: 3.32 MIL/uL — ABNORMAL LOW (ref 4.22–5.81)
RDW: 13.8 % (ref 11.5–15.5)
WBC: 5.4 10*3/uL (ref 4.0–10.5)
nRBC: 0 % (ref 0.0–0.2)

## 2021-05-16 MED ORDER — PALONOSETRON HCL INJECTION 0.25 MG/5ML
0.2500 mg | Freq: Once | INTRAVENOUS | Status: AC
  Administered 2021-05-16: 0.25 mg via INTRAVENOUS
  Filled 2021-05-16: qty 5

## 2021-05-16 MED ORDER — SODIUM CHLORIDE 0.9 % IV SOLN
Freq: Once | INTRAVENOUS | Status: AC
  Filled 2021-05-16: qty 250

## 2021-05-16 MED ORDER — SODIUM CHLORIDE 0.9 % IV SOLN
187.8000 mg | Freq: Once | INTRAVENOUS | Status: AC
  Administered 2021-05-16: 190 mg via INTRAVENOUS
  Filled 2021-05-16: qty 19

## 2021-05-16 MED ORDER — SODIUM CHLORIDE 0.9% FLUSH
10.0000 mL | INTRAVENOUS | Status: DC | PRN
  Administered 2021-05-16: 10 mL via INTRAVENOUS
  Filled 2021-05-16: qty 10

## 2021-05-16 MED ORDER — FAMOTIDINE IN NACL 20-0.9 MG/50ML-% IV SOLN
20.0000 mg | Freq: Once | INTRAVENOUS | Status: AC
  Administered 2021-05-16: 20 mg via INTRAVENOUS
  Filled 2021-05-16: qty 50

## 2021-05-16 MED ORDER — SODIUM CHLORIDE 0.9 % IV SOLN
10.0000 mg | Freq: Once | INTRAVENOUS | Status: AC
  Administered 2021-05-16: 10 mg via INTRAVENOUS
  Filled 2021-05-16: qty 10

## 2021-05-16 MED ORDER — DIPHENHYDRAMINE HCL 50 MG/ML IJ SOLN
50.0000 mg | Freq: Once | INTRAMUSCULAR | Status: AC
  Administered 2021-05-16: 50 mg via INTRAVENOUS
  Filled 2021-05-16: qty 1

## 2021-05-16 MED ORDER — OMEPRAZOLE 20 MG PO CPDR
20.0000 mg | DELAYED_RELEASE_CAPSULE | Freq: Every day | ORAL | 0 refills | Status: AC
Start: 2021-05-16 — End: ?

## 2021-05-16 MED ORDER — HEPARIN SOD (PORK) LOCK FLUSH 100 UNIT/ML IV SOLN
500.0000 [IU] | Freq: Once | INTRAVENOUS | Status: AC
  Administered 2021-05-16: 500 [IU] via INTRAVENOUS
  Filled 2021-05-16: qty 5

## 2021-05-16 MED ORDER — HEPARIN SOD (PORK) LOCK FLUSH 100 UNIT/ML IV SOLN
INTRAVENOUS | Status: AC
  Filled 2021-05-16: qty 5

## 2021-05-16 MED ORDER — SUCRALFATE 1 G PO TABS: 1.0000 g | ORAL_TABLET | ORAL | 0 refills | Status: AC

## 2021-05-16 NOTE — Progress Notes (Signed)
DISCONTINUE ON PATHWAY REGIMEN - Non-Small Cell Lung     Administer weekly:     Paclitaxel      Carboplatin   **Always confirm dose/schedule in your pharmacy ordering system**  REASON: Continuation Of Treatment PRIOR TREATMENT: [id number]: Carboplatin AUC=2 + Paclitaxel 45 mg/m2 Weekly During Radiation TREATMENT RESPONSE: Stable Disease (SD)  START ON PATHWAY REGIMEN - Non-Small Cell Lung     A cycle is every 14 days:     Durvalumab   **Always confirm dose/schedule in your pharmacy ordering system**  Patient Characteristics: Preoperative or Nonsurgical Candidate (Clinical Staging), Stage III - Nonsurgical Candidate (Nonsquamous and Squamous), PS = 0, 1 Therapeutic Status: Preoperative or Nonsurgical Candidate (Clinical Staging) AJCC T Category: cT4 AJCC N Category: cN1 AJCC M Category: cM0 AJCC 8 Stage Grouping: IIIA ECOG Performance Status: 1 Intent of Therapy: Curative Intent, Discussed with Patient

## 2021-05-16 NOTE — Progress Notes (Signed)
Hematology/Oncology Follow up note [phone] [phone]   Patient Care Team: Venita Lick, NP as PCP - General (Nurse Practitioner) Wellington Hampshire, MD as PCP - Cardiology (Cardiology) Beverly Gust, MD (Otolaryngology) Telford Nab, RN as Registered Nurse [name], MD as Radiation Oncologist (Radiation Oncology)  REFERRING PROVIDER: Venita Lick, NP REASON FOR VISIT Follow up for stage III squamous cell carcinoma.  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  He got definitive radiation to the larynx, and the patient has been doing annual checkup with ENT Dr. Tami Ribas.  # 09/02/2017 ENB biopsy of both lung nodule. Pathology of biopsies showed no lesional cells identified. However he is a high risk patient and negative biopsy results do not exclude malignancy.  May 2019 Patient underwent SBRT to the left lung lesion.  # 02/25/2019 CT scan showed right upper lobe nodule interval increase in size.  #03/2019 he finished SBRT to right upper lobe lesion.   01/24/2021, CT with contrast showed progressive right upper lobe malignancy, while separated from the site of regional right upper lobe lesion.  2 masslike areas, including the more central of these which directly invades the mediastinum, with potential direct tracheal invasion.  Progressive right hilar and subcarinal lymphadenopathy.  Left lower lobe at the site of previously noted left lower lobe lesion, there has been progressive septal thickening and nodularity which has slowly increased over numerous prior examinations.  Highly concerning for locally recurrent disease.  Aortic atherosclerosis, in addition to  three-vessel coronary artery disease.  Calcification of aortic valve.  1.7 cm lesion in the posterior aspect of the interpolar region of the right kidney.  Suspicious for small neoplasm.  02/11/2021, PET scan showed hypermetabolic central right upper lobe/perihilar recurrence with adjacent contiguous right paratracheal adenopathy.  No distal or progressive metastasis identified.  Low-level hypermetabolic activity in the right groin associated with it.  Postsurgical changes.  Coronary and aortic atherosclerosis.  #03/06/2021 status post biopsy via bronchoscopy. Pathology showed right mainstem bronchus positive for squamous cell carcinoma. Right mainstem bronchus brushings positive for SCC, lavage was suspicious for malignancy. Subcarinal lymph node negative.  precarinal lymph node negative.  03/26/2021 MRI brain with and without contrast showed age-related atrophy.  Minimal chronic small vessel ischemic changes.  No evidence of metastatic disease.  Incidental tiny cortical infarction.  04/08/2021 concurrent chemotherapy + RT Cycle 1 weekly carboplatin and Taxol.  04/18/2021 Analylatic reaction to Taxol, ER visit for observation.  04/25/2020 carboplatin single agent.    INTERVAL HISTORY Philip Wood is a 76 y.o. male who has above history reviewed by me today presents for follow-up visit for management of recurrent stage III squamous cell carcinoma Patient is currently on concurrent chemotherapy and radiation with single agent carboplatin.  He feels tired. " Food stuck"sensation. Denies pain during swallowing.   Review of Systems  Constitutional:  Positive for appetite change. Negative for chills, fatigue, fever and unexpected weight change.  HENT:   Negative for hearing loss and voice change.   Eyes:  Negative for eye problems and icterus.  Respiratory:  Positive for shortness of breath. Negative for chest tightness and cough.   Cardiovascular:  Negative for chest pain and leg swelling.  Gastrointestinal:  Negative for abdominal distention and abdominal pain.  Endocrine: Negative for hot flashes.  Genitourinary:  Negative for difficulty urinating, dysuria and frequency.   Musculoskeletal:  Negative for arthralgias.  Skin:  Negative for itching and rash.  Neurological:  Negative for light-headedness and numbness.  Hematological:  Negative for adenopathy. Does not bruise/bleed easily.  Psychiatric/Behavioral:  Negative for confusion.      MEDICAL HISTORY:  Past Medical History:  Diagnosis Date   Arthritis    Bruit of left carotid artery    Cancer ([facility])    laryngeal   Chronic heart failure (HCC)    Chronic kidney disease    COPD (chronic obstructive pulmonary disease) (HCC)    Coronary artery disease    Diabetes mellitus without complication (HCC)    GERD (gastroesophageal reflux disease)    Headache    History of kidney stones    Hyperlipidemia    Hypertension    Lumbago    Neuropathy    Tobacco abuse disorder     SURGICAL HISTORY: Past Surgical History:  Procedure Laterality Date   ARTERY BIOPSY Right 11/14/2020   Procedure: BIOPSY TEMPORAL ARTERY;  Surgeon: Katha Cabal, MD;  Location: [facility] ORS;  Service: Vascular;  Laterality: Right;   CAROTID PTA/STENT INTERVENTION Left 01/30/2021   Procedure: CAROTID PTA/STENT INTERVENTION;  Surgeon: Katha Cabal, MD;  Location: [facility];  Service: Cardiovascular;  Laterality: Left;   CHOLECYSTECTOMY     ELECTROMAGNETIC NAVIGATION BROCHOSCOPY N/A 08/19/2017   Procedure: ELECTROMAGNETIC NAVIGATION BRONCHOSCOPY;  Surgeon: Flora Lipps, MD;  Location: [facility] ORS;  Service: Cardiopulmonary;  Laterality: N/A;   ELECTROMAGNETIC NAVIGATION BROCHOSCOPY Left 09/02/2017   Procedure: ELECTROMAGNETIC NAVIGATION BRONCHOSCOPY;  Surgeon: Flora Lipps, MD;  Location: [facility] ORS;  Service: Cardiopulmonary;  Laterality: Left;   IR IMAGING GUIDED PORT INSERTION  03/29/2021   THROAT SURGERY     VIDEO BRONCHOSCOPY WITH  ENDOBRONCHIAL ULTRASOUND N/A 03/06/2021   Procedure: VIDEO BRONCHOSCOPY WITH ENDOBRONCHIAL ULTRASOUND;  Surgeon: Tyler Pita, MD;  Location: [facility] OR[facility];  Service: Cardiopulmonary;  Laterality: N/A;    SOCIAL HISTORY: Social History   Socioeconomic History   Marital status: Widowed    Spouse name: Not on file   Number of children: Not on file   Years of education: Not on file   Highest education level: 10th grade  Occupational History   Not on file  Tobacco Use   Smoking status: Light Smoker    Packs/day: 0.50    Years: 53.00    Pack years: 26.50    Types: Cigarettes   Smokeless tobacco: Never   Tobacco comments:    Aprrox 10 cigs/day--02/21/2021  Vaping Use   Vaping Use: Never used  Substance and Sexual Activity   Alcohol use: No    Alcohol/week: 0.0 standard drinks   Drug use: No   Sexual activity: Not Currently  Other Topics Concern   Not on file  Social History Narrative   Works part time at [employer] retired   Investment banker, operational of Radio broadcast assistant Strain: Low Risk    Difficulty of Paying Living Expenses: Not hard at all  Food Insecurity: No Food Insecurity   Worried About Charity fundraiser in the Last Year: Never true   Arboriculturist in the Last Year: Never true  Transportation Needs: No Transportation Needs   Lack of Transportation (Medical): No   Lack of Transportation (Non-Medical): No  Physical Activity: Inactive   Days of Exercise per  Week: 0 days   Minutes of Exercise per Session: 0 min  Stress: No Stress Concern Present   Feeling of Stress : Not at all  Social Connections: Not on file  Intimate Partner Violence: Not on file    FAMILY HISTORY: Family History  Problem Relation Age of Onset   Heart disease Father    Heart attack Father    Brain cancer Sister    Cervical cancer Sister    Stomach cancer Sister    Stomach cancer Sister    Diabetes Sister    Lupus Sister    Rectal cancer Brother    Lung cancer Brother    Lung  cancer Brother    Lung cancer Brother    Lung cancer Brother     ALLERGIES:  is allergic to paclitaxel.  MEDICATIONS:  Current Outpatient Medications  Medication Sig Dispense Refill   acetaminophen (TYLENOL) 650 MG CR tablet Take 1,300 mg by mouth every 8 (eight) hours as needed for pain.     albuterol (VENTOLIN HFA) 108 (90 Base) MCG/ACT inhaler Inhale 2 puffs into the lungs every 6 (six) hours as needed for wheezing or shortness of breath. 8 g 2   allopurinol (ZYLOPRIM) 100 MG tablet Take 1 tablet (100 mg total) by mouth daily. (Patient taking differently: Take 100 mg by mouth in the morning.) 90 tablet 4   aspirin EC 81 MG tablet Take 81 mg by mouth in the morning. Swallow whole.     atorvastatin (LIPITOR) 80 MG tablet Take 1 tablet (80 mg total) by mouth daily at 6 PM. 90 tablet 4   clopidogrel (PLAVIX) 75 MG tablet Take 1 tablet (75 mg total) by mouth daily. 30 tablet 11   diphenhydrAMINE (BENADRYL ALLERGY) 25 MG tablet Take 1 tablet (25 mg total) by mouth every 6 (six) hours as needed. 30 tablet 0   Ensure (ENSURE) Take 237 mLs by mouth daily.     lidocaine-prilocaine (EMLA) cream Apply to affected area once 30 g 3   metFORMIN (GLUCOPHAGE) 500 MG tablet Take 1 tablet (500 mg total) by mouth 2 (two) times daily with a meal. 180 tablet 4   metoprolol succinate (TOPROL XL) 25 MG 24 hr tablet Take 1 tablet (25 mg total) by mouth daily. (Patient taking differently: Take 25 mg by mouth in the morning.) 90 tablet 4   mirtazapine (REMERON) 7.5 MG tablet Take 1 tablet (7.5 mg total) by mouth at bedtime. 90 tablet 4   omeprazole (PRILOSEC) 20 MG capsule Take 1 capsule (20 mg total) by mouth daily. 30 capsule 0   prochlorperazine (COMPAZINE) 10 MG tablet Take 1 tablet (10 mg total) by mouth every 6 (six) hours as needed (Nausea or vomiting). 30 tablet 1   ramipril (ALTACE) 10 MG capsule Take 1 capsule (10 mg total) by mouth daily. (Patient taking differently: Take 10 mg by mouth in the morning.)  90 capsule 4   sucralfate (CARAFATE) 1 g tablet Take 1 tablet (1 g total) by mouth See admin instructions. Take 1 tablet (1 g total) by mouth 3 (three) times daily. Dissolve in 3-4 tbsp warm water, swish and swallow 90 tablet 0   tiotropium (SPIRIVA HANDIHALER) 18 MCG inhalation capsule PLACE 1 CAPSULE INTO INHALER AND INHALE ITS CONTENTS ONCE DAILY. (Patient taking differently: Place 18 mcg into inhaler and inhale in the morning.) 90 capsule 4   vitamin B-12 (CYANOCOBALAMIN) 1000 MCG tablet Take 1,000 mcg by mouth daily.     No current facility-administered medications  for this visit.     PHYSICAL EXAMINATION: ECOG PERFORMANCE STATUS: 1 - Symptomatic but completely ambulatory Vitals:   05/16/21 0917  BP: 103/62  Pulse: 93  Resp: 16  Temp: 97.9 F (36.6 C)  SpO2: 100%    Filed Weights   05/16/21 0917  Weight: 159 lb 14.4 oz (72.5 kg)   Physical Exam Constitutional:      General: He is not in acute distress.    Appearance: He is not diaphoretic.  HENT:     Head: Normocephalic and atraumatic.     Nose: Nose normal.     Mouth/Throat:     Pharynx: No oropharyngeal exudate.  Eyes:     General: No scleral icterus.    Pupils: Pupils are equal, round, and reactive to light.  Cardiovascular:     Rate and Rhythm: Normal rate and regular rhythm.     Heart sounds: No murmur heard. Pulmonary:     Effort: Pulmonary effort is normal. No respiratory distress.     Breath sounds: No rales.     Comments: Decreased breath sound bilaterally Chest:     Chest wall: No tenderness.  Abdominal:     General: There is no distension.     Palpations: Abdomen is soft.     Tenderness: There is no abdominal tenderness.  Musculoskeletal:        General: Normal range of motion.     Cervical back: Normal range of motion and neck supple.  Skin:    General: Skin is warm and dry.     Findings: No erythema.  Neurological:     Mental Status: He is alert and oriented to person, place, and time.      Cranial Nerves: No cranial nerve deficit.     Motor: No abnormal muscle tone.     Coordination: Coordination normal.  Psychiatric:        Mood and Affect: Affect normal.          LABORATORY DATA:  I have reviewed the data as listed Lab Results  Component Value Date   WBC 5.4 05/16/2021   HGB 10.3 (L) 05/16/2021   HCT 30.7 (L) 05/16/2021   MCV 92.5 05/16/2021   PLT 165 05/16/2021   Recent Labs    05/31/20 0904 07/30/20 0759 05/02/21 0831 05/09/21 0835 05/16/21 0831  NA 141   < > 135 135 135  K 4.8   < > 3.9 4.3 4.2  CL 101   < > 102 103 97*  CO2 26   < > _0 GLUCOSE 96   < > 107* 100* 160*  BUN 16   < > 20 20 25*  CREATININE 1.15   < > 0.80 0.76 0.80  CALCIUM 9.0   < > 8.2* 8.4* 8.9  GFRNONAA 62   < > >60 >60 >60  GFRAA 72  --   --   --   --   PROT  --    < > 5.8* 5.9* 6.0*  ALBUMIN  --    < > 2.9* 2.8* 2.6*  AST  --    < > 14* 13* 18  ALT  --    < > _1 ALKPHOS  --    < > 84 75 81  BILITOT  --    < > 0.7 0.6 0.6   < > = values in this interval not displayed.    RADIOGRAPHIC STUDIES: I have personally reviewed the radiological images as  listed and agreed with the findings in the report. MR Brain W Wo Contrast  Result Date: 03/26/2021 CLINICAL DATA:  Squamous cell carcinoma of the right lung.  Staging. EXAM: MRI HEAD WITHOUT AND WITH CONTRAST TECHNIQUE: Multiplanar, multiecho pulse sequences of the brain and surrounding structures were obtained without and with intravenous contrast. CONTRAST:  7.66mL GADAVIST GADOBUTROL 1 MMOL/ML IV SOLN COMPARISON:  10/16/2020 FINDINGS: Brain: Punctate focus of restricted diffusion at the right parietal vertex, diffusion imaging 40 and FLAIR image 46. This was not present in June, and is most consistent with a punctate cortical infarction. No abnormal enhancement occurs in this location. Elsewhere, the brain shows generalized atrophy with a few old small vessel insults of the deep white matter. No sign of mass lesion,  hemorrhage, hydrocephalus or extra-axial collection. Vascular: Major vessels at the base of the brain show flow. Skull and upper cervical spine: Negative Sinuses/Orbits: Clear/normal Other: None IMPRESSION: Age related atrophy. Minimal chronic small-vessel ischemic change of the white matter. No evidence of metastatic disease. Punctate focus of restricted diffusion and T2/FLAIR signal at the right parietal vertex as described above, most consistent with an incidental tiny cortical infarction. Tiny nonenhancing metastasis is not excluded but would be less likely. Electronically Signed   By: Nelson Chimes M.D.   On: 03/26/2021 10:42   DG Chest Port 1 View  Result Date: 03/06/2021 CLINICAL DATA:  Status post bronchoscopy EXAM: PORTABLE CHEST 1 VIEW COMPARISON:  Chest radiograph from 07/17/2017, CT chest 01/24/2021 FINDINGS: The cardiomediastinal silhouette is normal. Is peculiar did mass is seen in the right suprahilar region with surrounding architectural distortion as seen on the recent CT chest. There is no evidence of pneumothorax following bronchoscopy. The lungs are otherwise clear. There is no pleural effusion. There is no acute osseous abnormality. IMPRESSION: 1. No evidence of pneumothorax following bronchoscopy. 2. Right upper lobe mass as seen on prior chest CT. Electronically Signed   By: Valetta Mole M.D.   On: 03/06/2021 14:28   IR IMAGING GUIDED PORT INSERTION  Result Date: 03/29/2021 INDICATION: RIGHT lung cancer. EXAM: IMPLANTED PORT A CATH PLACEMENT WITH ULTRASOUND AND FLUOROSCOPIC GUIDANCE MEDICATIONS: None ANESTHESIA/SEDATION: Moderate (conscious) sedation was employed during this procedure. A total of Versed 1 mg and Fentanyl 50 mcg was administered intravenously. Moderate Sedation Time: 26 minutes. The patient's level of consciousness and vital signs were monitored continuously by radiology nursing throughout the procedure under my direct supervision. FLUOROSCOPY TIME:  0 minutes, 12  seconds (0.9 mGy) COMPLICATIONS: None immediate. PROCEDURE: The procedure, risks, benefits, and alternatives were explained to the patient. Questions regarding the procedure were encouraged and answered. The patient understands and consents to the procedure. The LEFT neck and chest were prepped with chlorhexidine in a sterile fashion, and a sterile drape was applied covering the operative field. Maximum barrier sterile technique with sterile gowns and gloves were used for the procedure. A timeout was performed prior to the initiation of the procedure. Local anesthesia was provided with 1% lidocaine with epinephrine. After creating a small venotomy incision, a micropuncture kit was utilized to access the internal jugular vein under direct, real-time ultrasound guidance. Ultrasound image documentation was performed. The microwire was kinked to measure appropriate catheter length. A subcutaneous port pocket was then created along the upper chest wall utilizing a combination of sharp and blunt dissection. The pocket was irrigated with sterile saline. A single lumen ISP power injectable port was chosen for placement. The 8 Fr catheter was tunneled from the port pocket site  to the venotomy incision. The port was placed in the pocket. The external catheter was trimmed to appropriate length. At the venotomy, an 8 Fr peel-away sheath was placed over a guidewire under fluoroscopic guidance. The catheter was then placed through the sheath and the sheath was removed. Final catheter positioning was confirmed and documented with a fluoroscopic spot radiograph. The port was accessed with a Huber needle, aspirated and flushed with heparinized saline. The port pocket incision was closed with interrupted 3-0 Vicryl suture then Dermabond was applied, including at the venotomy incision. Dressings were placed. The patient tolerated the procedure well without immediate post procedural complication. IMPRESSION: Successful placement of a  LEFT internal jugular approach power injectable Port-A-Cath. The tip of the catheter is located at the proximal RIGHT atrium. The catheter is ready for immediate use. Michaelle Birks, MD [facility] Rad[facility] Electronically Signed   By: Michaelle Birks M.D.   On: 03/29/2021 11:58      ASSESSMENT & PLAN:  1. Malignant neoplasm of bronchus and lung ([facility])   2. Squamous cell carcinoma of larynx (HCC)   3. Encounter for antineoplastic chemotherapy   4. Kidney lesion    Cancer Staging  Lung cancer [facility]) Staging form: Lung, AJCC 8th Edition - Clinical: Stage IIIA (rcT4, cN1, cM0) - Signed by Earlie Server, MD on 02/13/2021  #Presumed lung cancer, status post SBRT of left lung nodule in 2019 and SBRP of right upper lobe nodule . 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  Labs are reviewed and discussed with patient. Proceed with cycle 6 carboplatin today.. Patient will finish radiation on 05/21/2021. We discussed about repeat CT scanning in 4 weeks for evaluation of disease status and rationale and potential side effects of immunotherapy with Durvalumab.  # Radiation esophagitis. Recommend omeprazole 34m daily, Carafate 1 g 3 times daily.  Prescription sent to pharmacy.  #Weight loss/malnutrition, continue nutrition supplementation.  Decreased oral intake, IV normal saline 1 L x 1 today.  # Squamous cell carcinoma of larynx, recommend he continue to have annual check up with ENT Dr.McQueen.  #Right kidney lesion, not well characterized on PET scan.   will obtain MRI renal protocol for further evaluation of the kidney lesion once he finishes treatment for lung cancer..   We spent sufficient time to discuss many aspect of care, questions were answered to patient's satisfaction. Follow-up  Port lab/NP/IVF 1/31 CT chest with contrast in 4 weeks Lab MD Durvalumab beginning of March.   ZEarlie Server MD, PhD  05/16/2021

## 2021-05-16 NOTE — Progress Notes (Signed)
Pt continues to lose weight, down 2 lbs since last visit.  Pt states unable to swallow anything that is not soft or liquid consistency.

## 2021-05-16 NOTE — Patient Instructions (Signed)
[facility] CANCER CTR AT [place]  Discharge Instructions: Thank you for choosing [facility] to provide your oncology and hematology care.  If you have a lab appointment with the [facility], please go directly to the [facility] and check in at the registration area.  Wear comfortable clothing and clothing appropriate for easy access to any Portacath or PICC line.   We strive to give you quality time with your provider. You may need to reschedule your appointment if you arrive late (15 or more minutes).  Arriving late affects you and other patients whose appointments are after yours.  Also, if you miss three or more appointments without notifying the office, you may be dismissed from the clinic at the providers discretion.      For prescription refill requests, have your pharmacy contact our office and allow 72 hours for refills to be completed.    Today you received the following chemotherapy and/or immunotherapy agents : Carboplatin   To help prevent nausea and vomiting after your treatment, we encourage you to take your nausea medication as directed.  BELOW ARE SYMPTOMS THAT SHOULD BE REPORTED IMMEDIATELY: *FEVER GREATER THAN 100.4 F (38 C) OR HIGHER *CHILLS OR SWEATING *NAUSEA AND VOMITING THAT IS NOT CONTROLLED WITH YOUR NAUSEA MEDICATION *UNUSUAL SHORTNESS OF BREATH *UNUSUAL BRUISING OR BLEEDING *URINARY PROBLEMS (pain or burning when urinating, or frequent urination) *BOWEL PROBLEMS (unusual diarrhea, constipation, pain near the anus) TENDERNESS IN MOUTH AND THROAT WITH OR WITHOUT PRESENCE OF ULCERS (sore throat, sores in mouth, or a toothache) UNUSUAL RASH, SWELLING OR PAIN  UNUSUAL VAGINAL DISCHARGE OR ITCHING   Items with * indicate a potential emergency and should be followed up as soon as possible or go to the Emergency Department if any problems should occur.  Please show the CHEMOTHERAPY ALERT CARD or IMMUNOTHERAPY ALERT CARD at check-in to  the Emergency Department and triage nurse.  Should you have questions after your visit or need to cancel or reschedule your appointment, please contact [facility] CANCER [place] AT [place]  [phone] and follow the prompts.  Office hours are 8:00 a.m. to 4:30 p.m. Monday - Friday. Please note that voicemails left after 4:00 p.m. may not be returned until the following business day.  We are closed weekends and major holidays. You have access to a nurse at all times for urgent questions. Please call the main number to the clinic [phone] and follow the prompts.  For any non-urgent questions, you may also contact your provider using MyChart. We now offer e-Visits for anyone 70 and older to request care online for non-urgent symptoms. For details visit [email or web address].   Also download the MyChart app! Go to the app store, search "MyChart", open the app, select [facility], and log in with your MyChart username and password.  Due to Covid, a mask is required upon entering the hospital/clinic. If you do not have a mask, one will be given to you upon arrival. For doctor visits, patients may have 1 support person aged 52 or older with them. For treatment visits, patients cannot have anyone with them due to current Covid guidelines and our immunocompromised population.

## 2021-05-17 ENCOUNTER — Ambulatory Visit
Admission: RE | Admit: 2021-05-17 | Discharge: 2021-05-17 | Disposition: A | Discharge status: Home / Self Care | Payer: Medicare Other | Source: Ambulatory Visit | Attending: Radiation Oncology | Admitting: Radiation Oncology

## 2021-05-17 DIAGNOSIS — I2510 Atherosclerotic heart disease of native coronary artery without angina pectoris: Secondary | ICD-10-CM | POA: Diagnosis not present

## 2021-05-17 DIAGNOSIS — N189 Chronic kidney disease, unspecified: Secondary | ICD-10-CM | POA: Diagnosis not present

## 2021-05-17 DIAGNOSIS — I130 Hypertensive heart and chronic kidney disease with heart failure and stage 1 through stage 4 chronic kidney disease, or unspecified chronic kidney disease: Secondary | ICD-10-CM | POA: Diagnosis not present

## 2021-05-17 DIAGNOSIS — K4090 Unilateral inguinal hernia, without obstruction or gangrene, not specified as recurrent: Secondary | ICD-10-CM | POA: Diagnosis not present

## 2021-05-17 DIAGNOSIS — Z5111 Encounter for antineoplastic chemotherapy: Secondary | ICD-10-CM | POA: Diagnosis not present

## 2021-05-17 DIAGNOSIS — Z510 Encounter for antineoplastic radiation therapy: Secondary | ICD-10-CM | POA: Diagnosis not present

## 2021-05-17 DIAGNOSIS — C329 Malignant neoplasm of larynx, unspecified: Secondary | ICD-10-CM | POA: Diagnosis not present

## 2021-05-17 DIAGNOSIS — R590 Localized enlarged lymph nodes: Secondary | ICD-10-CM | POA: Diagnosis not present

## 2021-05-17 DIAGNOSIS — E1122 Type 2 diabetes mellitus with diabetic chronic kidney disease: Secondary | ICD-10-CM | POA: Diagnosis not present

## 2021-05-17 DIAGNOSIS — F17210 Nicotine dependence, cigarettes, uncomplicated: Secondary | ICD-10-CM | POA: Diagnosis not present

## 2021-05-17 DIAGNOSIS — C3411 Malignant neoplasm of upper lobe, right bronchus or lung: Secondary | ICD-10-CM | POA: Diagnosis not present

## 2021-05-20 ENCOUNTER — Ambulatory Visit
Admission: RE | Admit: 2021-05-20 | Discharge: 2021-05-20 | Disposition: A | Discharge status: Home / Self Care | Payer: Medicare Other | Source: Ambulatory Visit | Attending: Radiation Oncology | Admitting: Radiation Oncology

## 2021-05-20 DIAGNOSIS — K4090 Unilateral inguinal hernia, without obstruction or gangrene, not specified as recurrent: Secondary | ICD-10-CM | POA: Diagnosis not present

## 2021-05-20 DIAGNOSIS — F17210 Nicotine dependence, cigarettes, uncomplicated: Secondary | ICD-10-CM | POA: Diagnosis not present

## 2021-05-20 DIAGNOSIS — Z5111 Encounter for antineoplastic chemotherapy: Secondary | ICD-10-CM | POA: Diagnosis not present

## 2021-05-20 DIAGNOSIS — Z510 Encounter for antineoplastic radiation therapy: Secondary | ICD-10-CM | POA: Diagnosis not present

## 2021-05-20 DIAGNOSIS — C329 Malignant neoplasm of larynx, unspecified: Secondary | ICD-10-CM | POA: Diagnosis not present

## 2021-05-20 DIAGNOSIS — I130 Hypertensive heart and chronic kidney disease with heart failure and stage 1 through stage 4 chronic kidney disease, or unspecified chronic kidney disease: Secondary | ICD-10-CM | POA: Diagnosis not present

## 2021-05-20 DIAGNOSIS — R590 Localized enlarged lymph nodes: Secondary | ICD-10-CM | POA: Diagnosis not present

## 2021-05-20 DIAGNOSIS — C3411 Malignant neoplasm of upper lobe, right bronchus or lung: Secondary | ICD-10-CM | POA: Diagnosis not present

## 2021-05-20 DIAGNOSIS — E1122 Type 2 diabetes mellitus with diabetic chronic kidney disease: Secondary | ICD-10-CM | POA: Diagnosis not present

## 2021-05-20 DIAGNOSIS — N189 Chronic kidney disease, unspecified: Secondary | ICD-10-CM | POA: Diagnosis not present

## 2021-05-20 DIAGNOSIS — I2510 Atherosclerotic heart disease of native coronary artery without angina pectoris: Secondary | ICD-10-CM | POA: Diagnosis not present

## 2021-05-21 ENCOUNTER — Inpatient Hospital Stay: Payer: Medicare Other

## 2021-05-21 ENCOUNTER — Inpatient Hospital Stay: Payer: Medicare Other | Admitting: Nurse Practitioner

## 2021-05-21 ENCOUNTER — Ambulatory Visit
Admission: RE | Admit: 2021-05-21 | Discharge: 2021-05-21 | Disposition: A | Discharge status: Home / Self Care | Payer: Medicare Other | Source: Ambulatory Visit | Attending: Radiation Oncology | Admitting: Radiation Oncology

## 2021-05-21 ENCOUNTER — Encounter: Payer: Self-pay | Admitting: Nurse Practitioner

## 2021-05-21 ENCOUNTER — Inpatient Hospital Stay (HOSPITAL_BASED_OUTPATIENT_CLINIC_OR_DEPARTMENT_OTHER): Payer: Medicare Other | Admitting: Nurse Practitioner

## 2021-05-21 ENCOUNTER — Other Ambulatory Visit: Payer: Self-pay

## 2021-05-21 VITALS — BP 115/64 | HR 84 | Temp 96.8°F | Resp 20 | Wt 160.4 lb

## 2021-05-21 DIAGNOSIS — C3401 Malignant neoplasm of right main bronchus: Secondary | ICD-10-CM

## 2021-05-21 DIAGNOSIS — C329 Malignant neoplasm of larynx, unspecified: Secondary | ICD-10-CM | POA: Diagnosis not present

## 2021-05-21 DIAGNOSIS — I130 Hypertensive heart and chronic kidney disease with heart failure and stage 1 through stage 4 chronic kidney disease, or unspecified chronic kidney disease: Secondary | ICD-10-CM | POA: Diagnosis not present

## 2021-05-21 DIAGNOSIS — K208 Other esophagitis without bleeding: Secondary | ICD-10-CM | POA: Diagnosis not present

## 2021-05-21 DIAGNOSIS — Z510 Encounter for antineoplastic radiation therapy: Secondary | ICD-10-CM | POA: Diagnosis not present

## 2021-05-21 DIAGNOSIS — N189 Chronic kidney disease, unspecified: Secondary | ICD-10-CM | POA: Diagnosis not present

## 2021-05-21 DIAGNOSIS — T66XXXA Radiation sickness, unspecified, initial encounter: Secondary | ICD-10-CM

## 2021-05-21 DIAGNOSIS — C3411 Malignant neoplasm of upper lobe, right bronchus or lung: Secondary | ICD-10-CM | POA: Diagnosis not present

## 2021-05-21 DIAGNOSIS — I2510 Atherosclerotic heart disease of native coronary artery without angina pectoris: Secondary | ICD-10-CM | POA: Diagnosis not present

## 2021-05-21 DIAGNOSIS — E860 Dehydration: Secondary | ICD-10-CM

## 2021-05-21 DIAGNOSIS — K4090 Unilateral inguinal hernia, without obstruction or gangrene, not specified as recurrent: Secondary | ICD-10-CM | POA: Diagnosis not present

## 2021-05-21 DIAGNOSIS — F17210 Nicotine dependence, cigarettes, uncomplicated: Secondary | ICD-10-CM | POA: Diagnosis not present

## 2021-05-21 DIAGNOSIS — R590 Localized enlarged lymph nodes: Secondary | ICD-10-CM | POA: Diagnosis not present

## 2021-05-21 DIAGNOSIS — E1122 Type 2 diabetes mellitus with diabetic chronic kidney disease: Secondary | ICD-10-CM | POA: Diagnosis not present

## 2021-05-21 DIAGNOSIS — Z5111 Encounter for antineoplastic chemotherapy: Secondary | ICD-10-CM | POA: Diagnosis not present

## 2021-05-21 LAB — CBC WITH DIFFERENTIAL/PLATELET
Abs Immature Granulocytes: 0.02 10*3/uL (ref 0.00–0.07)
Basophils Absolute: 0 10*3/uL (ref 0.0–0.1)
Basophils Relative: 0 %
Eosinophils Absolute: 0.1 10*3/uL (ref 0.0–0.5)
Eosinophils Relative: 1 %
HCT: 28.2 % — ABNORMAL LOW (ref 39.0–52.0)
Hemoglobin: 9.4 g/dL — ABNORMAL LOW (ref 13.0–17.0)
Immature Granulocytes: 0 %
Lymphocytes Relative: 14 %
Lymphs Abs: 0.8 10*3/uL (ref 0.7–4.0)
MCH: 30.7 pg (ref 26.0–34.0)
MCHC: 33.3 g/dL (ref 30.0–36.0)
MCV: 92.2 fL (ref 80.0–100.0)
Monocytes Absolute: 1.2 10*3/uL — ABNORMAL HIGH (ref 0.1–1.0)
Monocytes Relative: 21 %
Neutro Abs: 3.5 10*3/uL (ref 1.7–7.7)
Neutrophils Relative %: 64 %
Platelets: 144 10*3/uL — ABNORMAL LOW (ref 150–400)
RBC: 3.06 MIL/uL — ABNORMAL LOW (ref 4.22–5.81)
RDW: 14.1 % (ref 11.5–15.5)
WBC: 5.6 10*3/uL (ref 4.0–10.5)
nRBC: 0 % (ref 0.0–0.2)

## 2021-05-21 LAB — COMPREHENSIVE METABOLIC PANEL
ALT: 12 U/L (ref 0–44)
AST: 18 U/L (ref 15–41)
Albumin: 2.4 g/dL — ABNORMAL LOW (ref 3.5–5.0)
Alkaline Phosphatase: 70 U/L (ref 38–126)
Anion gap: 12 (ref 5–15)
BUN: 28 mg/dL — ABNORMAL HIGH (ref 8–23)
CO2: 24 mmol/L (ref 22–32)
Calcium: 8.4 mg/dL — ABNORMAL LOW (ref 8.9–10.3)
Chloride: 97 mmol/L — ABNORMAL LOW (ref 98–111)
Creatinine, Ser: 1.04 mg/dL (ref 0.61–1.24)
GFR, Estimated: >60 mL/min (ref >60)
Glucose, Bld: 146 mg/dL — ABNORMAL HIGH (ref 70–99)
Potassium: 3.8 mmol/L (ref 3.5–5.1)
Sodium: 133 mmol/L — ABNORMAL LOW (ref 135–145)
Total Bilirubin: 0.3 mg/dL (ref 0.3–1.2)
Total Protein: 5.6 g/dL — ABNORMAL LOW (ref 6.5–8.1)

## 2021-05-21 LAB — TSH: TSH: 2.123 u[IU]/mL (ref 0.350–4.500)

## 2021-05-21 LAB — T4, FREE: Free T4: 1.06 ng/dL (ref 0.61–1.12)

## 2021-05-21 MED ORDER — SODIUM CHLORIDE 0.9% FLUSH
10.0000 mL | Freq: Once | INTRAVENOUS | Status: AC
  Administered 2021-05-21: 10 mL via INTRAVENOUS
  Filled 2021-05-21: qty 10

## 2021-05-21 MED ORDER — HEPARIN SOD (PORK) LOCK FLUSH 100 UNIT/ML IV SOLN
500.0000 [IU] | Freq: Once | INTRAVENOUS | Status: AC
  Administered 2021-05-21: 500 [IU] via INTRAVENOUS
  Filled 2021-05-21: qty 5

## 2021-05-21 MED ORDER — SODIUM CHLORIDE 0.9 % IV SOLN
Freq: Once | INTRAVENOUS | Status: AC
  Filled 2021-05-21: qty 250

## 2021-05-21 NOTE — Progress Notes (Signed)
Hematology/Oncology Follow up Note [phone] [phone]   Patient Care Team: Venita Lick, NP as PCP - General (Nurse Practitioner) Wellington Hampshire, MD as PCP - Cardiology (Cardiology) Beverly Gust, MD (Otolaryngology) Telford Nab, RN as Registered Nurse [name], MD as Radiation Oncologist (Radiation Oncology)  REFERRING PROVIDER: Venita Lick, NP  REASON FOR VISIT Follow up for stage III squamous cell carcinoma.  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  He got definitive radiation to the larynx, and the patient has been doing annual checkup with ENT Dr. Tami Ribas.  # 09/02/2017 ENB biopsy of both lung nodule. Pathology of biopsies showed no lesional cells identified. However he is a high risk patient and negative biopsy results do not exclude malignancy.  May 2019 Patient underwent SBRT to the left lung lesion.  # 02/25/2019 CT scan showed right upper lobe nodule interval increase in size.  #03/2019 he finished SBRT to right upper lobe lesion.   01/24/2021, CT with contrast showed progressive right upper lobe malignancy, while separated from the site of regional right upper lobe lesion.  2 masslike areas, including the more central of these which directly invades the mediastinum, with potential direct tracheal invasion.  Progressive right hilar and subcarinal lymphadenopathy.  Left lower lobe at the site of previously noted left lower lobe lesion, there has been progressive septal thickening and nodularity which has slowly increased over numerous prior examinations.  Highly concerning for locally recurrent disease.  Aortic atherosclerosis, in addition to  three-vessel coronary artery disease.  Calcification of aortic valve.  1.7 cm lesion in the posterior aspect of the interpolar region of the right kidney.  Suspicious for small neoplasm.  02/11/2021, PET scan showed hypermetabolic central right upper lobe/perihilar recurrence with adjacent contiguous right paratracheal adenopathy.  No distal or progressive metastasis identified.  Low-level hypermetabolic activity in the right groin associated with it.  Postsurgical changes.  Coronary and aortic atherosclerosis.  #03/06/2021 status post biopsy via bronchoscopy. Pathology showed right mainstem bronchus positive for squamous cell carcinoma. Right mainstem bronchus brushings positive for SCC, lavage was suspicious for malignancy. Subcarinal lymph node negative.  precarinal lymph node negative.  03/26/2021 MRI brain with and without contrast showed age-related atrophy.  Minimal chronic small vessel ischemic changes.  No evidence of metastatic disease.  Incidental tiny cortical infarction.  04/08/2021 concurrent chemotherapy + RT Cycle 1 weekly carboplatin and Taxol.  04/18/2021 Analylatic reaction to Taxol, ER visit for observation.  04/25/2020 carboplatin single agent.    INTERVAL HISTORY Philip Wood is a 76 y.o. male who has above history reviewed by me today presents for follow-up visit for management of recurrent stage III squamous cell carcinoma.He has completed chemo and radiation as of today. Continues to have difficulty swallowing coarse foods. No other complaints today.   Review of Systems  Constitutional:  Negative for appetite change, chills, fatigue, fever and unexpected weight change.  HENT:   Negative for hearing loss and voice change.   Eyes:  Negative for eye problems and icterus.  Respiratory:  Negative for chest tightness, cough and shortness of breath.   Cardiovascular:  Negative for chest pain and leg swelling.  Gastrointestinal:  Negative for abdominal distention and  abdominal pain.  Endocrine: Negative for hot flashes.  Genitourinary:  Negative for difficulty urinating, dysuria and frequency.   Musculoskeletal:  Negative for arthralgias.  Skin:  Negative for itching and rash.  Neurological:  Negative for light-headedness and numbness.  Hematological:  Negative for adenopathy. Does not bruise/bleed easily.  Psychiatric/Behavioral:  Negative for confusion.      MEDICAL HISTORY:  Past Medical History:  Diagnosis Date   Arthritis    Bruit of left carotid artery    Cancer ([place])    laryngeal   Chronic heart failure (HCC)    Chronic kidney disease    COPD (chronic obstructive pulmonary disease) (HCC)    Coronary artery disease    Diabetes mellitus without complication (HCC)    GERD (gastroesophageal reflux disease)    Headache    History of kidney stones    Hyperlipidemia    Hypertension    Lumbago    Neuropathy    Tobacco abuse disorder     SURGICAL HISTORY: Past Surgical History:  Procedure Laterality Date   ARTERY BIOPSY Right 11/14/2020   Procedure: BIOPSY TEMPORAL ARTERY;  Surgeon: Katha Cabal, MD;  Location: [facility] ORS;  Service: Vascular;  Laterality: Right;   CAROTID PTA/STENT INTERVENTION Left 01/30/2021   Procedure: CAROTID PTA/STENT INTERVENTION;  Surgeon: Katha Cabal, MD;  Location: [facility];  Service: Cardiovascular;  Laterality: Left;   CHOLECYSTECTOMY     ELECTROMAGNETIC NAVIGATION BROCHOSCOPY N/A 08/19/2017   Procedure: ELECTROMAGNETIC NAVIGATION BRONCHOSCOPY;  Surgeon: Flora Lipps, MD;  Location: [facility] ORS;  Service: Cardiopulmonary;  Laterality: N/A;   ELECTROMAGNETIC NAVIGATION BROCHOSCOPY Left 09/02/2017   Procedure: ELECTROMAGNETIC NAVIGATION BRONCHOSCOPY;  Surgeon: Flora Lipps, MD;  Location: [facility] ORS;  Service: Cardiopulmonary;  Laterality: Left;   IR IMAGING GUIDED PORT INSERTION  03/29/2021   THROAT SURGERY     VIDEO BRONCHOSCOPY WITH ENDOBRONCHIAL ULTRASOUND N/A 03/06/2021   Procedure: VIDEO  BRONCHOSCOPY WITH ENDOBRONCHIAL ULTRASOUND;  Surgeon: Tyler Pita, MD;  Location: [facility] ORS;  Service: Cardiopulmonary;  Laterality: N/A;    SOCIAL HISTORY: Social History   Socioeconomic History   Marital status: Widowed    Spouse name: Not on file   Number of children: Not on file   Years of education: Not on file   Highest education level: 10th grade  Occupational History   Not on file  Tobacco Use   Smoking status: Light Smoker    Packs/day: 0.50    Years: 53.00    Pack years: 26.50    Types: Cigarettes   Smokeless tobacco: Never   Tobacco comments:    Aprrox 10 cigs/day--02/21/2021  Vaping Use   Vaping Use: Never used  Substance and Sexual Activity   Alcohol use: No    Alcohol/week: 0.0 standard drinks   Drug use: No   Sexual activity: Not Currently  Other Topics Concern   Not on file  Social History Narrative   Works part time at [employer] retired   Investment banker, operational of Radio broadcast assistant Strain: Low Risk    Difficulty of Paying Living Expenses: Not hard at all  Food Insecurity: No Food Insecurity   Worried About Charity fundraiser in the Last Year: Never true   Arboriculturist in the Last Year: Never true  Transportation Needs: No Transportation Needs   Lack of Transportation (Medical): No   Lack of Transportation (Non-Medical): No  Physical Activity: Inactive   Days of Exercise per Week: 0 days   Minutes of  Exercise per Session: 0 min  Stress: No Stress Concern Present   Feeling of Stress : Not at all  Social Connections: Not on file  Intimate Partner Violence: Not on file    FAMILY HISTORY: Family History  Problem Relation Age of Onset   Heart disease Father    Heart attack Father    Brain cancer Sister    Cervical cancer Sister    Stomach cancer Sister    Stomach cancer Sister    Diabetes Sister    Lupus Sister    Rectal cancer Brother    Lung cancer Brother    Lung cancer Brother    Lung cancer Brother    Lung cancer Brother      ALLERGIES:  is allergic to paclitaxel.  MEDICATIONS:  Current Outpatient Medications  Medication Sig Dispense Refill   acetaminophen (TYLENOL) 650 MG CR tablet Take 1,300 mg by mouth every 8 (eight) hours as needed for pain.     albuterol (VENTOLIN HFA) 108 (90 Base) MCG/ACT inhaler Inhale 2 puffs into the lungs every 6 (six) hours as needed for wheezing or shortness of breath. 8 g 2   allopurinol (ZYLOPRIM) 100 MG tablet Take 1 tablet (100 mg total) by mouth daily. (Patient taking differently: Take 100 mg by mouth in the morning.) 90 tablet 4   aspirin EC 81 MG tablet Take 81 mg by mouth in the morning. Swallow whole.     atorvastatin (LIPITOR) 80 MG tablet Take 1 tablet (80 mg total) by mouth daily at 6 PM. 90 tablet 4   clopidogrel (PLAVIX) 75 MG tablet Take 1 tablet (75 mg total) by mouth daily. 30 tablet 11   diphenhydrAMINE (BENADRYL ALLERGY) 25 MG tablet Take 1 tablet (25 mg total) by mouth every 6 (six) hours as needed. 30 tablet 0   Ensure (ENSURE) Take 237 mLs by mouth daily.     lidocaine-prilocaine (EMLA) cream Apply to affected area once 30 g 3   metFORMIN (GLUCOPHAGE) 500 MG tablet Take 1 tablet (500 mg total) by mouth 2 (two) times daily with a meal. 180 tablet 4   metoprolol succinate (TOPROL XL) 25 MG 24 hr tablet Take 1 tablet (25 mg total) by mouth daily. (Patient taking differently: Take 25 mg by mouth in the morning.) 90 tablet 4   mirtazapine (REMERON) 7.5 MG tablet Take 1 tablet (7.5 mg total) by mouth at bedtime. 90 tablet 4   omeprazole (PRILOSEC) 20 MG capsule Take 1 capsule (20 mg total) by mouth daily. 30 capsule 0   prochlorperazine (COMPAZINE) 10 MG tablet Take 1 tablet (10 mg total) by mouth every 6 (six) hours as needed (Nausea or vomiting). 30 tablet 1   ramipril (ALTACE) 10 MG capsule Take 1 capsule (10 mg total) by mouth daily. (Patient taking differently: Take 10 mg by mouth in the morning.) 90 capsule 4   sucralfate (CARAFATE) 1 g tablet Take 1 tablet (1  g total) by mouth See admin instructions. Take 1 tablet (1 g total) by mouth 3 (three) times daily. Dissolve in 3-4 tbsp warm water, swish and swallow 90 tablet 0   tiotropium (SPIRIVA HANDIHALER) 18 MCG inhalation capsule PLACE 1 CAPSULE INTO INHALER AND INHALE ITS CONTENTS ONCE DAILY. (Patient taking differently: Place 18 mcg into inhaler and inhale in the morning.) 90 capsule 4   vitamin B-12 (CYANOCOBALAMIN) 1000 MCG tablet Take 1,000 mcg by mouth daily.     No current facility-administered medications for this visit.   Facility-Administered Medications  Ordered in Other Visits  Medication Dose Route Frequency Provider Last Rate Last Admin   0.9 %  sodium chloride infusion   Intravenous Once Verlon Au, NP 999 mL/hr at 05/21/21 1042 New Bag at 05/21/21 1042   heparin lock flush 100 unit/mL  500 Units Intravenous Once Verlon Au, NP         PHYSICAL EXAMINATION: ECOG PERFORMANCE STATUS: 1 - Symptomatic but completely ambulatory Vitals:   05/21/21 1045  BP: 115/64  Pulse: 84  Resp: 20  Temp: (!) 96.8 F (36 C)  SpO2: 100%   Filed Weights   05/21/21 1045  Weight: 160 lb 6.4 oz (72.8 kg)   Physical Exam Constitutional:      Appearance: He is not ill-appearing.  Pulmonary:     Effort: No respiratory distress.  Abdominal:     Tenderness: There is no abdominal tenderness.  Skin:    General: Skin is warm and dry.  Neurological:     Mental Status: He is alert and oriented to person, place, and time.  Psychiatric:        Mood and Affect: Mood normal.        Behavior: Behavior normal.      LABORATORY DATA:  I have reviewed the data as listed Lab Results  Component Value Date   WBC 5.4 05/16/2021   HGB 10.3 (L) 05/16/2021   HCT 30.7 (L) 05/16/2021   MCV 92.5 05/16/2021   PLT 165 05/16/2021   Recent Labs    05/31/20 0904 07/30/20 0759 05/02/21 0831 05/09/21 0835 05/16/21 0831  NA 141   < > 135 135 135  K 4.8   < > 3.9 4.3 4.2  CL 101   < > 102 103 97*   CO2 26   < > _0 GLUCOSE 96   < > 107* 100* 160*  BUN 16   < > 20 20 25*  CREATININE 1.15   < > 0.80 0.76 0.80  CALCIUM 9.0   < > 8.2* 8.4* 8.9  GFRNONAA 62   < > >60 >60 >60  GFRAA 72  --   --   --   --   PROT  --    < > 5.8* 5.9* 6.0*  ALBUMIN  --    < > 2.9* 2.8* 2.6*  AST  --    < > 14* 13* 18  ALT  --    < > _1 ALKPHOS  --    < > 84 75 81  BILITOT  --    < > 0.7 0.6 0.6   < > = values in this interval not displayed.   RADIOGRAPHIC STUDIES: I have personally reviewed the radiological images as listed and agreed with the findings in the report. MR Brain W Wo Contrast  Result Date: 03/26/2021 CLINICAL DATA:  Squamous cell carcinoma of the right lung.  Staging. EXAM: MRI HEAD WITHOUT AND WITH CONTRAST TECHNIQUE: Multiplanar, multiecho pulse sequences of the brain and surrounding structures were obtained without and with intravenous contrast. CONTRAST:  7.79m GADAVIST GADOBUTROL 1 MMOL/ML IV SOLN COMPARISON:  10/16/2020 FINDINGS: Brain: Punctate focus of restricted diffusion at the right parietal vertex, diffusion imaging 40 and FLAIR image 46. This was not present in June, and is most consistent with a punctate cortical infarction. No abnormal enhancement occurs in this location. Elsewhere, the brain shows generalized atrophy with a few old small vessel insults of the deep white matter. No sign of  mass lesion, hemorrhage, hydrocephalus or extra-axial collection. Vascular: Major vessels at the base of the brain show flow. Skull and upper cervical spine: Negative Sinuses/Orbits: Clear/normal Other: None IMPRESSION: Age related atrophy. Minimal chronic small-vessel ischemic change of the white matter. No evidence of metastatic disease. Punctate focus of restricted diffusion and T2/FLAIR signal at the right parietal vertex as described above, most consistent with an incidental tiny cortical infarction. Tiny nonenhancing metastasis is not excluded but would be less likely.  Electronically Signed   By: Nelson Chimes M.D.   On: 03/26/2021 10:42   DG Chest Port 1 View  Result Date: 03/06/2021 CLINICAL DATA:  Status post bronchoscopy EXAM: PORTABLE CHEST 1 VIEW COMPARISON:  Chest radiograph from 07/17/2017, CT chest 01/24/2021 FINDINGS: The cardiomediastinal silhouette is normal. Is peculiar did mass is seen in the right suprahilar region with surrounding architectural distortion as seen on the recent CT chest. There is no evidence of pneumothorax following bronchoscopy. The lungs are otherwise clear. There is no pleural effusion. There is no acute osseous abnormality. IMPRESSION: 1. No evidence of pneumothorax following bronchoscopy. 2. Right upper lobe mass as seen on prior chest CT. Electronically Signed   By: Valetta Mole M.D.   On: 03/06/2021 14:28   IR IMAGING GUIDED PORT INSERTION  Result Date: 03/29/2021 INDICATION: RIGHT lung cancer. EXAM: IMPLANTED PORT A CATH PLACEMENT WITH ULTRASOUND AND FLUOROSCOPIC GUIDANCE MEDICATIONS: None ANESTHESIA/SEDATION: Moderate (conscious) sedation was employed during this procedure. A total of Versed 1 mg and Fentanyl 50 mcg was administered intravenously. Moderate Sedation Time: 26 minutes. The patient's level of consciousness and vital signs were monitored continuously by radiology nursing throughout the procedure under my direct supervision. FLUOROSCOPY TIME:  0 minutes, 12 seconds (0.9 mGy) COMPLICATIONS: None immediate. PROCEDURE: The procedure, risks, benefits, and alternatives were explained to the patient. Questions regarding the procedure were encouraged and answered. The patient understands and consents to the procedure. The LEFT neck and chest were prepped with chlorhexidine in a sterile fashion, and a sterile drape was applied covering the operative field. Maximum barrier sterile technique with sterile gowns and gloves were used for the procedure. A timeout was performed prior to the initiation of the procedure. Local anesthesia  was provided with 1% lidocaine with epinephrine. After creating a small venotomy incision, a micropuncture kit was utilized to access the internal jugular vein under direct, real-time ultrasound guidance. Ultrasound image documentation was performed. The microwire was kinked to measure appropriate catheter length. A subcutaneous port pocket was then created along the upper chest wall utilizing a combination of sharp and blunt dissection. The pocket was irrigated with sterile saline. A single lumen ISP power injectable port was chosen for placement. The 8 Fr catheter was tunneled from the port pocket site to the venotomy incision. The port was placed in the pocket. The external catheter was trimmed to appropriate length. At the venotomy, an 8 Fr peel-away sheath was placed over a guidewire under fluoroscopic guidance. The catheter was then placed through the sheath and the sheath was removed. Final catheter positioning was confirmed and documented with a fluoroscopic spot radiograph. The port was accessed with a Huber needle, aspirated and flushed with heparinized saline. The port pocket incision was closed with interrupted 3-0 Vicryl suture then Dermabond was applied, including at the venotomy incision. Dressings were placed. The patient tolerated the procedure well without immediate post procedural complication. IMPRESSION: Successful placement of a LEFT internal jugular approach power injectable Port-A-Cath. The tip of the catheter is located at the  proximal RIGHT atrium. The catheter is ready for immediate use. Michaelle Birks, MD [facility] Radiology Electronically Signed   By: Michaelle Birks M.D.   On: 03/29/2021 11:58      ASSESSMENT & PLAN:  No diagnosis found.  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 S/p cycle 6 carboplatin. Taxol discontinued at cycle 2 d/t anaphylaxis (ER). Completed radiation today. Plan for CT scan for follow up. Consideration of maintenance durvalumab.   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    # Weight loss/malnutrition- weight stable. continue nutrition supplementation.  Decreased oral intake, IV normal saline 1 L x 1 today.  # Squamous cell carcinoma of larynx, recommend he continue to have annual check up with ENT Dr.McQueen.  # Right kidney lesion, not well characterized on PET scan. MRI renal protocol for further evaluation of the kidney lesion once he finishes treatment for lung cancer.  LBeckey Rutter DNP, AGNP-C [phone](clinic) 05/21/2021

## 2021-05-21 NOTE — Progress Notes (Signed)
Pt received 1 L NS for hydration today. No complaints at time of discharge to home.

## 2021-05-21 NOTE — Progress Notes (Signed)
Pt completed radiation treatments today. Receiving 1 L NS and has a visit to see NP today.

## 2021-05-30 ENCOUNTER — Other Ambulatory Visit: Payer: Self-pay

## 2021-05-30 ENCOUNTER — Inpatient Hospital Stay: Payer: Medicare Other | Attending: Oncology

## 2021-05-30 NOTE — Progress Notes (Signed)
Nutrition Follow-up:  Patient with recurrent lung cancer.  Patient has completed chemotherapy and radiation.   Spoke with patient via phone.  Patient reports that his appetite is not that good.  Says that he does not have any taste or smell.  Says that he can't swallow much meat.  Eating mashed potatoes, soups, ice cream, jello, pudding, beans, yogurt, eggs.  Drinking ensure shakes 3 a day.  Reports that his sister is bringing him food to eat.  Does report that coffee taste good but nothing else has a taste.      Medications: reviewed  Labs: reviewed  Anthropometrics:   Weight 160 lb on 1/31  161 lb on 1/19 166 lb on 1/5 173 lb on 9/15   NUTRITION DIAGNOSIS: Inadequate oral intake continues    INTERVENTION:  Continue ensure TID for additional calories and protein Discussed foods high in protein to include at each meal/snack Reviewed ways to add calories    MONITORING, EVALUATION, GOAL: weight trends, intake   NEXT VISIT: Thursday, March 2 during infusion  Keenya Matera B. Zenia Resides, [place], [place] Registered Dietitian [phone] (mobile)

## 2021-06-04 ENCOUNTER — Other Ambulatory Visit: Payer: Self-pay

## 2021-06-04 ENCOUNTER — Inpatient Hospital Stay: Payer: Medicare Other

## 2021-06-04 ENCOUNTER — Inpatient Hospital Stay
Admission: EM | Admit: 2021-06-04 | Discharge: 2021-06-19 | DRG: 951 | Disposition: E | Payer: Medicare Other | Attending: Internal Medicine | Admitting: Internal Medicine

## 2021-06-04 ENCOUNTER — Emergency Department: Payer: Medicare Other

## 2021-06-04 DIAGNOSIS — Z833 Family history of diabetes mellitus: Secondary | ICD-10-CM | POA: Diagnosis not present

## 2021-06-04 DIAGNOSIS — R079 Chest pain, unspecified: Secondary | ICD-10-CM | POA: Diagnosis present

## 2021-06-04 DIAGNOSIS — Z66 Do not resuscitate: Secondary | ICD-10-CM | POA: Diagnosis present

## 2021-06-04 DIAGNOSIS — R4020 Unspecified coma: Secondary | ICD-10-CM | POA: Diagnosis present

## 2021-06-04 DIAGNOSIS — Z923 Personal history of irradiation: Secondary | ICD-10-CM | POA: Diagnosis not present

## 2021-06-04 DIAGNOSIS — Y95 Nosocomial condition: Secondary | ICD-10-CM | POA: Diagnosis present

## 2021-06-04 DIAGNOSIS — E1140 Type 2 diabetes mellitus with diabetic neuropathy, unspecified: Secondary | ICD-10-CM | POA: Diagnosis not present

## 2021-06-04 DIAGNOSIS — Z20822 Contact with and (suspected) exposure to covid-19: Secondary | ICD-10-CM | POA: Diagnosis present

## 2021-06-04 DIAGNOSIS — Z832 Family history of diseases of the blood and blood-forming organs and certain disorders involving the immune mechanism: Secondary | ICD-10-CM

## 2021-06-04 DIAGNOSIS — I6529 Occlusion and stenosis of unspecified carotid artery: Secondary | ICD-10-CM | POA: Diagnosis present

## 2021-06-04 DIAGNOSIS — Z8521 Personal history of malignant neoplasm of larynx: Secondary | ICD-10-CM

## 2021-06-04 DIAGNOSIS — J189 Pneumonia, unspecified organism: Secondary | ICD-10-CM | POA: Diagnosis present

## 2021-06-04 DIAGNOSIS — D649 Anemia, unspecified: Secondary | ICD-10-CM | POA: Diagnosis not present

## 2021-06-04 DIAGNOSIS — I5042 Chronic combined systolic (congestive) and diastolic (congestive) heart failure: Secondary | ICD-10-CM | POA: Diagnosis present

## 2021-06-04 DIAGNOSIS — I469 Cardiac arrest, cause unspecified: Secondary | ICD-10-CM | POA: Diagnosis not present

## 2021-06-04 DIAGNOSIS — C3491 Malignant neoplasm of unspecified part of right bronchus or lung: Secondary | ICD-10-CM | POA: Diagnosis not present

## 2021-06-04 DIAGNOSIS — Z515 Encounter for palliative care: Secondary | ICD-10-CM | POA: Diagnosis not present

## 2021-06-04 DIAGNOSIS — R0902 Hypoxemia: Secondary | ICD-10-CM | POA: Diagnosis not present

## 2021-06-04 DIAGNOSIS — I130 Hypertensive heart and chronic kidney disease with heart failure and stage 1 through stage 4 chronic kidney disease, or unspecified chronic kidney disease: Secondary | ICD-10-CM | POA: Diagnosis not present

## 2021-06-04 DIAGNOSIS — I5022 Chronic systolic (congestive) heart failure: Secondary | ICD-10-CM | POA: Diagnosis present

## 2021-06-04 DIAGNOSIS — I2510 Atherosclerotic heart disease of native coronary artery without angina pectoris: Secondary | ICD-10-CM | POA: Diagnosis present

## 2021-06-04 DIAGNOSIS — R918 Other nonspecific abnormal finding of lung field: Secondary | ICD-10-CM | POA: Diagnosis not present

## 2021-06-04 DIAGNOSIS — E1169 Type 2 diabetes mellitus with other specified complication: Secondary | ICD-10-CM | POA: Diagnosis not present

## 2021-06-04 DIAGNOSIS — C3490 Malignant neoplasm of unspecified part of unspecified bronchus or lung: Secondary | ICD-10-CM | POA: Diagnosis not present

## 2021-06-04 DIAGNOSIS — K219 Gastro-esophageal reflux disease without esophagitis: Secondary | ICD-10-CM | POA: Diagnosis present

## 2021-06-04 DIAGNOSIS — N1831 Chronic kidney disease, stage 3a: Secondary | ICD-10-CM | POA: Diagnosis not present

## 2021-06-04 DIAGNOSIS — Z800 Family history of malignant neoplasm of digestive organs: Secondary | ICD-10-CM

## 2021-06-04 DIAGNOSIS — E785 Hyperlipidemia, unspecified: Secondary | ICD-10-CM | POA: Diagnosis not present

## 2021-06-04 DIAGNOSIS — F32A Depression, unspecified: Secondary | ICD-10-CM | POA: Diagnosis not present

## 2021-06-04 DIAGNOSIS — J439 Emphysema, unspecified: Secondary | ICD-10-CM | POA: Diagnosis not present

## 2021-06-04 DIAGNOSIS — I482 Chronic atrial fibrillation, unspecified: Secondary | ICD-10-CM | POA: Diagnosis present

## 2021-06-04 DIAGNOSIS — D696 Thrombocytopenia, unspecified: Secondary | ICD-10-CM | POA: Diagnosis not present

## 2021-06-04 DIAGNOSIS — I7121 Aneurysm of the ascending aorta, without rupture: Secondary | ICD-10-CM | POA: Diagnosis not present

## 2021-06-04 DIAGNOSIS — Z8249 Family history of ischemic heart disease and other diseases of the circulatory system: Secondary | ICD-10-CM

## 2021-06-04 DIAGNOSIS — J432 Centrilobular emphysema: Secondary | ICD-10-CM | POA: Diagnosis present

## 2021-06-04 DIAGNOSIS — I6522 Occlusion and stenosis of left carotid artery: Secondary | ICD-10-CM | POA: Diagnosis not present

## 2021-06-04 DIAGNOSIS — M1990 Unspecified osteoarthritis, unspecified site: Secondary | ICD-10-CM | POA: Diagnosis present

## 2021-06-04 DIAGNOSIS — F17219 Nicotine dependence, cigarettes, with unspecified nicotine-induced disorders: Secondary | ICD-10-CM | POA: Diagnosis not present

## 2021-06-04 DIAGNOSIS — R402443 Other coma, without documented Glasgow coma scale score, or with partial score reported, at hospital admission: Secondary | ICD-10-CM

## 2021-06-04 DIAGNOSIS — Z888 Allergy status to other drugs, medicaments and biological substances status: Secondary | ICD-10-CM

## 2021-06-04 DIAGNOSIS — R778 Other specified abnormalities of plasma proteins: Secondary | ICD-10-CM | POA: Diagnosis not present

## 2021-06-04 DIAGNOSIS — F17210 Nicotine dependence, cigarettes, uncomplicated: Secondary | ICD-10-CM | POA: Diagnosis present

## 2021-06-04 DIAGNOSIS — J90 Pleural effusion, not elsewhere classified: Secondary | ICD-10-CM | POA: Diagnosis not present

## 2021-06-04 DIAGNOSIS — E1122 Type 2 diabetes mellitus with diabetic chronic kidney disease: Secondary | ICD-10-CM | POA: Diagnosis not present

## 2021-06-04 DIAGNOSIS — I4891 Unspecified atrial fibrillation: Secondary | ICD-10-CM | POA: Diagnosis present

## 2021-06-04 DIAGNOSIS — R911 Solitary pulmonary nodule: Secondary | ICD-10-CM | POA: Diagnosis not present

## 2021-06-04 DIAGNOSIS — Z743 Need for continuous supervision: Secondary | ICD-10-CM | POA: Diagnosis not present

## 2021-06-04 DIAGNOSIS — R0602 Shortness of breath: Secondary | ICD-10-CM | POA: Diagnosis not present

## 2021-06-04 DIAGNOSIS — Z7902 Long term (current) use of antithrombotics/antiplatelets: Secondary | ICD-10-CM

## 2021-06-04 DIAGNOSIS — R7989 Other specified abnormal findings of blood chemistry: Secondary | ICD-10-CM | POA: Diagnosis present

## 2021-06-04 DIAGNOSIS — Z801 Family history of malignant neoplasm of trachea, bronchus and lung: Secondary | ICD-10-CM

## 2021-06-04 DIAGNOSIS — M109 Gout, unspecified: Secondary | ICD-10-CM | POA: Diagnosis present

## 2021-06-04 DIAGNOSIS — Z79899 Other long term (current) drug therapy: Secondary | ICD-10-CM

## 2021-06-04 DIAGNOSIS — Z8049 Family history of malignant neoplasm of other genital organs: Secondary | ICD-10-CM

## 2021-06-04 DIAGNOSIS — Z808 Family history of malignant neoplasm of other organs or systems: Secondary | ICD-10-CM

## 2021-06-04 DIAGNOSIS — I10 Essential (primary) hypertension: Secondary | ICD-10-CM

## 2021-06-04 DIAGNOSIS — M7989 Other specified soft tissue disorders: Secondary | ICD-10-CM | POA: Diagnosis not present

## 2021-06-04 DIAGNOSIS — Z7984 Long term (current) use of oral hypoglycemic drugs: Secondary | ICD-10-CM

## 2021-06-04 LAB — COMPREHENSIVE METABOLIC PANEL
ALT: 24 U/L (ref 0–44)
AST: 29 U/L (ref 15–41)
Albumin: 1.7 g/dL — ABNORMAL LOW (ref 3.5–5.0)
Alkaline Phosphatase: 93 U/L (ref 38–126)
Anion gap: 8 (ref 5–15)
BUN: 41 mg/dL — ABNORMAL HIGH (ref 8–23)
CO2: 25 mmol/L (ref 22–32)
Calcium: 8.2 mg/dL — ABNORMAL LOW (ref 8.9–10.3)
Chloride: 101 mmol/L (ref 98–111)
Creatinine, Ser: 0.72 mg/dL (ref 0.61–1.24)
GFR, Estimated: >60 mL/min (ref >60)
Glucose, Bld: 154 mg/dL — ABNORMAL HIGH (ref 70–99)
Potassium: 4.6 mmol/L (ref 3.5–5.1)
Sodium: 134 mmol/L — ABNORMAL LOW (ref 135–145)
Total Bilirubin: 1.1 mg/dL (ref 0.3–1.2)
Total Protein: 5.4 g/dL — ABNORMAL LOW (ref 6.5–8.1)

## 2021-06-04 LAB — CBC WITH DIFFERENTIAL/PLATELET
Abs Immature Granulocytes: 0.03 10*3/uL (ref 0.00–0.07)
Basophils Absolute: 0 10*3/uL (ref 0.0–0.1)
Basophils Relative: 0 %
Eosinophils Absolute: 0 10*3/uL (ref 0.0–0.5)
Eosinophils Relative: 0 %
HCT: 25.5 % — ABNORMAL LOW (ref 39.0–52.0)
Hemoglobin: 8.2 g/dL — ABNORMAL LOW (ref 13.0–17.0)
Immature Granulocytes: 1 %
Lymphocytes Relative: 11 %
Lymphs Abs: 0.6 10*3/uL — ABNORMAL LOW (ref 0.7–4.0)
MCH: 30 pg (ref 26.0–34.0)
MCHC: 32.2 g/dL (ref 30.0–36.0)
MCV: 93.4 fL (ref 80.0–100.0)
Monocytes Absolute: 0.7 10*3/uL (ref 0.1–1.0)
Monocytes Relative: 13 %
Neutro Abs: 4.3 10*3/uL (ref 1.7–7.7)
Neutrophils Relative %: 75 %
Platelets: 74 10*3/uL — ABNORMAL LOW (ref 150–400)
RBC: 2.73 MIL/uL — ABNORMAL LOW (ref 4.22–5.81)
RDW: 15.9 % — ABNORMAL HIGH (ref 11.5–15.5)
Smear Review: NORMAL
WBC: 5.7 10*3/uL (ref 4.0–10.5)
nRBC: 0 % (ref 0.0–0.2)

## 2021-06-04 LAB — MRSA NEXT GEN BY PCR, NASAL: MRSA by PCR Next Gen: NOT DETECTED

## 2021-06-04 LAB — PROCALCITONIN: Procalcitonin: 0.92 ng/mL

## 2021-06-04 LAB — BRAIN NATRIURETIC PEPTIDE: B Natriuretic Peptide: 250.6 pg/mL — ABNORMAL HIGH (ref 0.0–100.0)

## 2021-06-04 LAB — TYPE AND SCREEN
ABO/RH(D): B POS
Antibody Screen: NEGATIVE

## 2021-06-04 LAB — APTT: aPTT: 45 seconds — ABNORMAL HIGH (ref 24–36)

## 2021-06-04 LAB — RESP PANEL BY RT-PCR (FLU A&B, COVID) ARPGX2
Influenza A by PCR: NEGATIVE
Influenza B by PCR: NEGATIVE
SARS Coronavirus 2 by RT PCR: NEGATIVE

## 2021-06-04 LAB — LACTIC ACID, PLASMA: Lactic Acid, Venous: 1.4 mmol/L (ref 0.5–1.9)

## 2021-06-04 LAB — TROPONIN I (HIGH SENSITIVITY)
Troponin I (High Sensitivity): 19 ng/L — ABNORMAL HIGH (ref <18)
Troponin I (High Sensitivity): 18 ng/L — ABNORMAL HIGH (ref <18)

## 2021-06-04 LAB — PROTIME-INR
INR: 1.3 — ABNORMAL HIGH (ref 0.8–1.2)
Prothrombin Time: 16.2 seconds — ABNORMAL HIGH (ref 11.4–15.2)

## 2021-06-04 IMAGING — CT CT ANGIO CHEST
2 of 7 series · 17 of 46 positions shown · IV contrast (APPLIED)
Comparison: [DATE] PET-CT.  [DATE] chest CT.

CLINICAL DATA: Pulmonary embolism (PE) suspected, high prob.
Non-small cell right lung cancer status post radiation therapy.
Difficulty sleeping. Chest soreness.

EXAM:
CT ANGIOGRAPHY CHEST WITH CONTRAST
TECHNIQUE: Multidetector CT imaging of the chest was performed using the
standard protocol during bolus administration of intravenous
contrast. Multiplanar CT image reconstructions and MIPs were
obtained to evaluate the vascular anatomy.

[Series 5: thins · axial · 0.65mm/px · z∈[-721,-434]mm · 14 of 394 slices shown]
[im 20/394  lung]
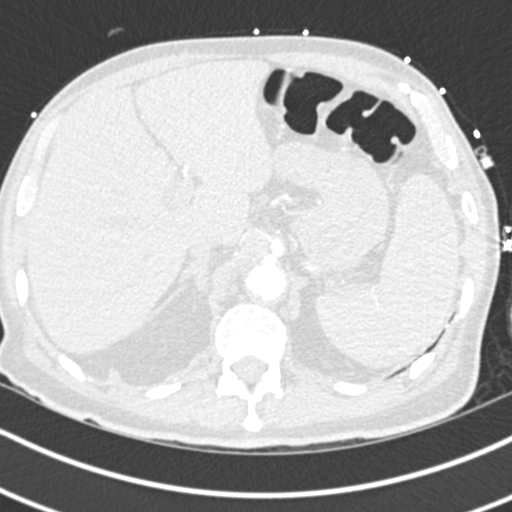
[im 59/394  soft-tissue]
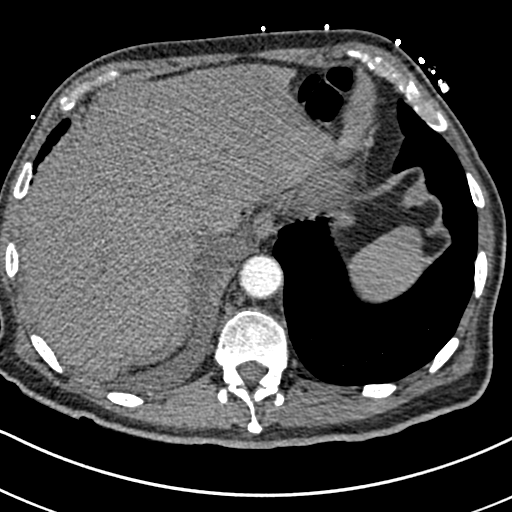
[im 79/394  lung]
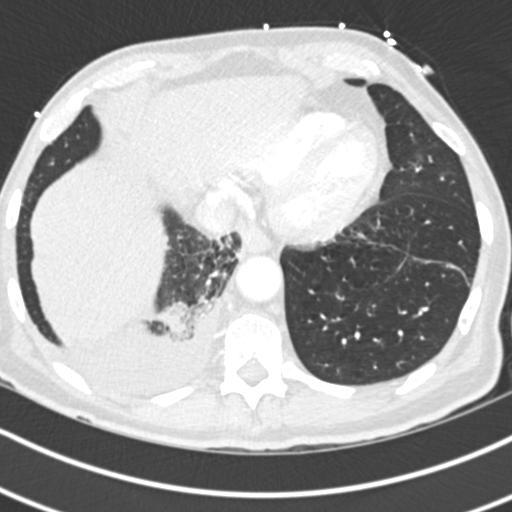
[im 99/394  soft-tissue]
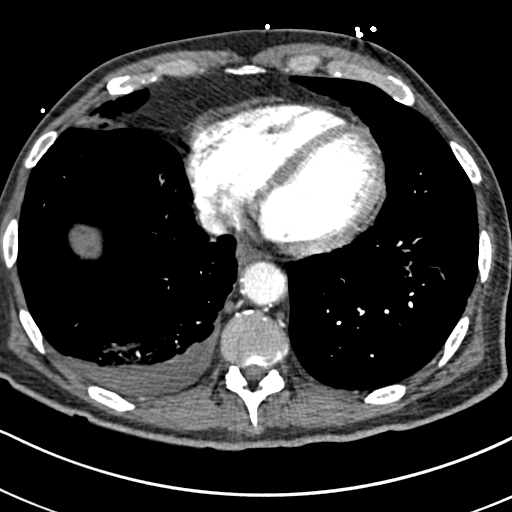
[im 138/394  lung]
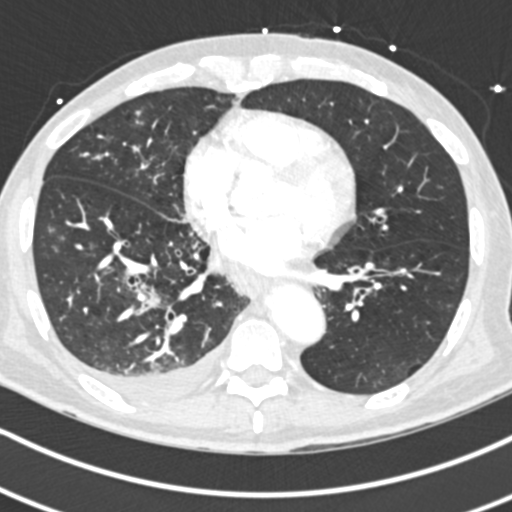
[im 158/394  soft-tissue]
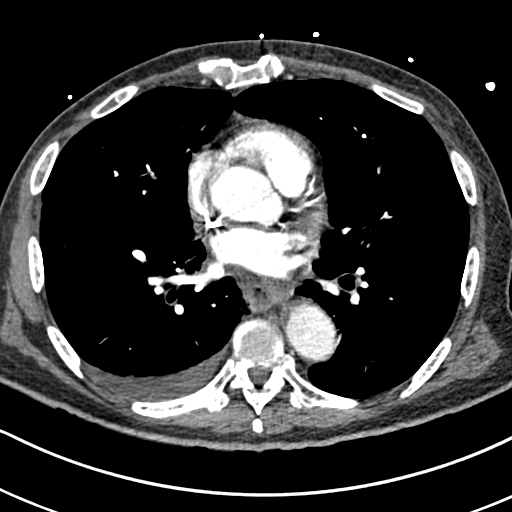
[im 177/394  lung]
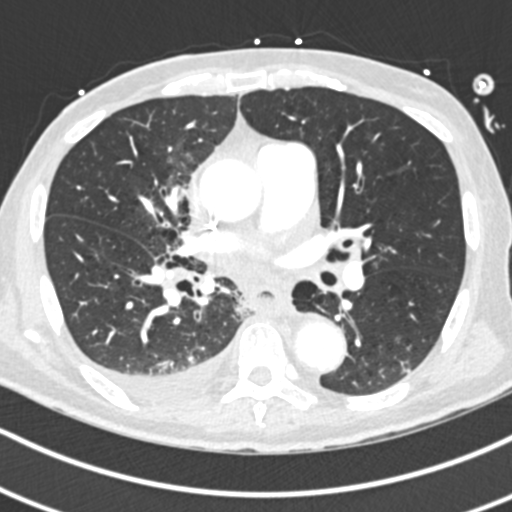
[im 217/394  soft-tissue]
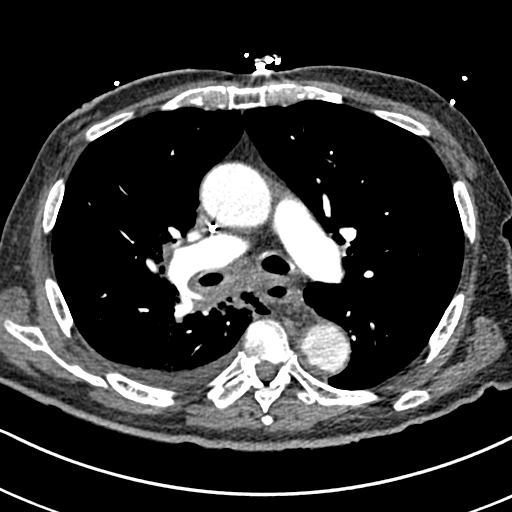
[im 236/394  lung]
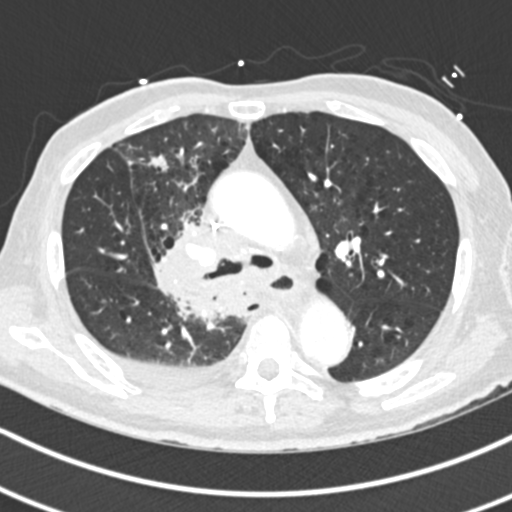
[im 256/394  soft-tissue]
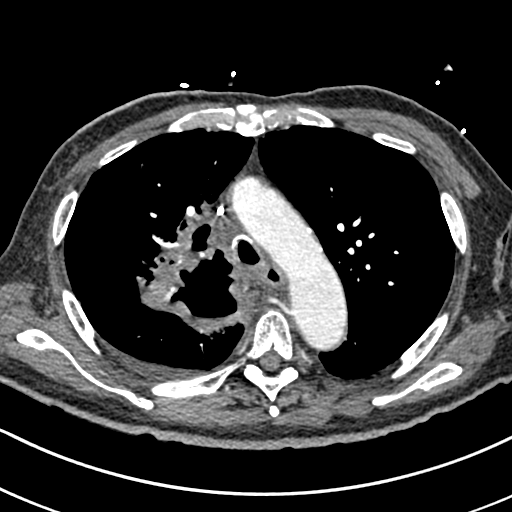
[im 295/394  lung]
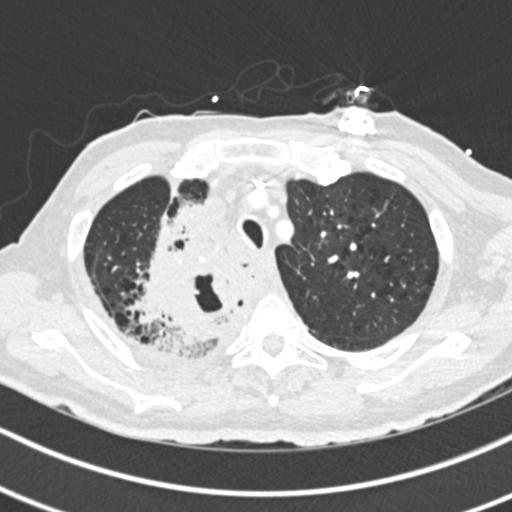
[im 315/394  soft-tissue]
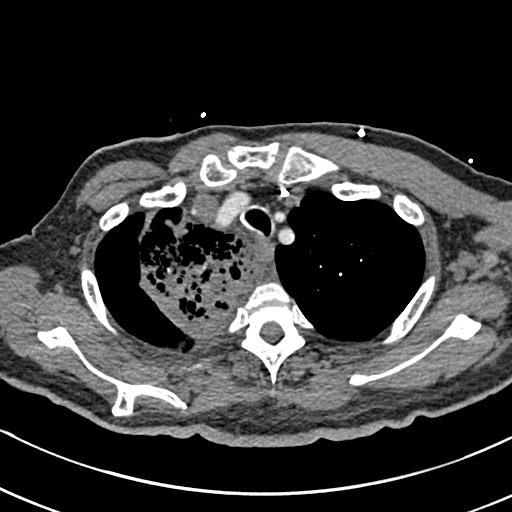
[im 335/394  lung]
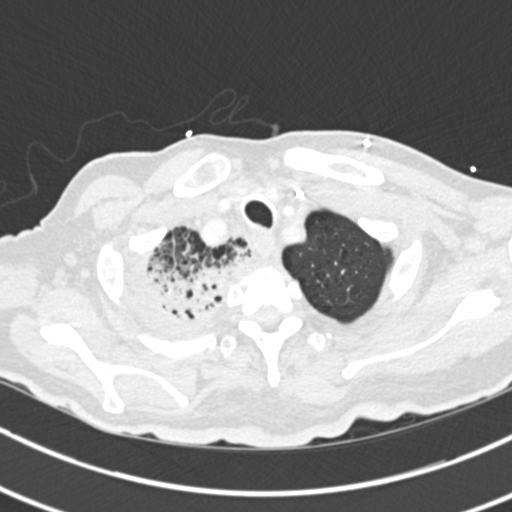
[im 374/394  soft-tissue]
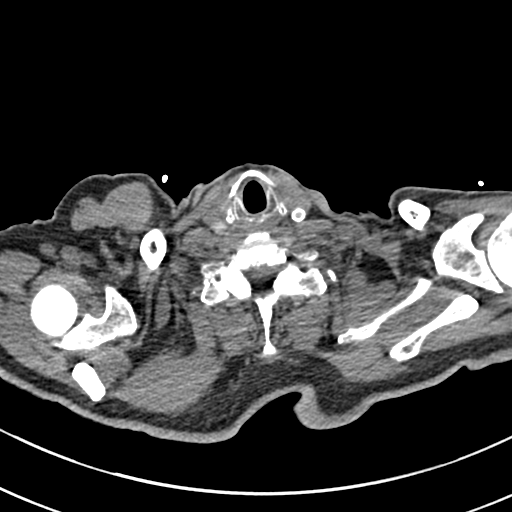

[Series 7: coronal mpr · coronal · 0.62mm/px · 3 of 79 slices shown]
[im 20/79  soft-tissue]
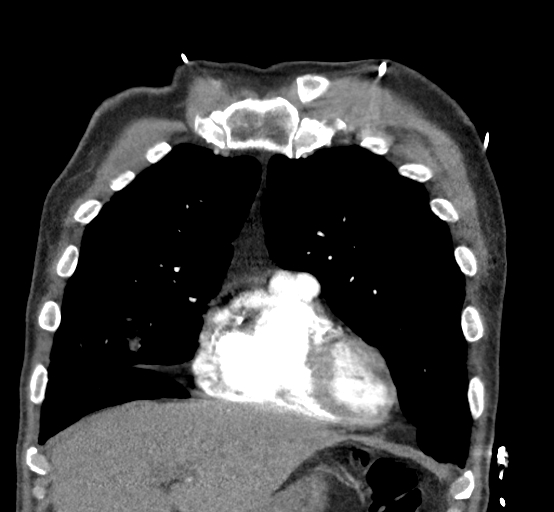
[im 40/79  soft-tissue]
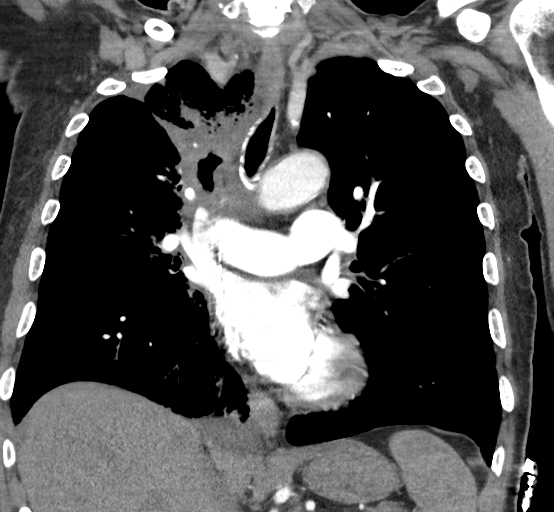
[im 59/79  soft-tissue]
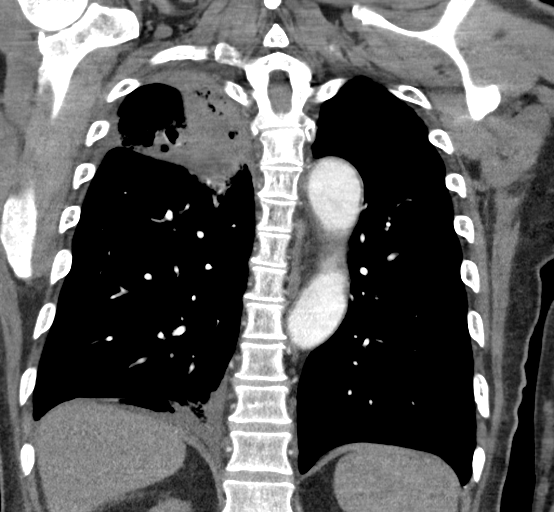

[17 of 46 positions shown; findings below may reference images not displayed]

RADIATION DOSE REDUCTION: This exam was performed according to the
departmental dose-optimization program which includes automated
exposure control, adjustment of the mA and/or kV according to
patient size and/or use of iterative reconstruction technique.

CONTRAST:  75mL OMNIPAQUE IOHEXOL 350 MG/ML SOLN
FINDINGS: Cardiovascular: The study is high quality for the evaluation of
pulmonary embolism. There are no filling defects in the central,
lobar, segmental or subsegmental pulmonary artery branches to
suggest acute pulmonary embolism. Atherosclerotic thoracic aorta
with stable dilated 4.2 cm ascending thoracic aorta. Left internal
jugular Port-A-Cath terminates at the cavoatrial junction. Dilated
main pulmonary artery (3.5 cm diameter). Normal heart size. No
significant pericardial fluid/thickening. Three-vessel coronary
atherosclerosis.

Mediastinum/Nodes: No discrete thyroid nodules. Small amount of
layering debris in fluid in the mid thoracic esophagus. No axillary
adenopathy. Stable mildly enlarged 1.3 cm subcarinal node (series
4/image 49). Newly mildly enlarged 1.1 cm right infrahilar node
(series 4/image 56). No left hilar adenopathy.

Lungs/Pleura: No pneumothorax. Small dependent right pleural
effusion is new. No left pleural effusion. Moderate centrilobular
emphysema. Infiltrative large central perihilar cavitary right upper
lobe 8.2 x 6.8 cm lung mass (series 6/image 35), significantly
increased from 4.9 x 3.5 cm on [DATE] chest CT and significantly
increased in central cavitation, contiguous with the carina, right
hilum, subcarinal and lower right paratracheal regions. Increased
patchy consolidation throughout the right upper lobe surrounding the
central mass. New patchy mild-to-moderate tree-in-bud opacity and
small regions of consolidation throughout the remaining right
greater than left lungs, most prominent in the basilar right middle
lobe and basilar right lower lobe. Indistinct 1.3 x 0.9 cm anterior
left lower lobe pulmonary nodule (series 6/image 76), previously
x 0.9 cm, not substantially changed.

Upper abdomen: No acute abnormality.

Musculoskeletal: No aggressive appearing focal osseous lesions.
Minimal thoracic spondylosis.

Review of the MIP images confirms the above findings.
IMPRESSION: 1. No pulmonary embolism.
2. Infiltrative large central perihilar right upper lobe 8.2 x
cm lung mass, significantly increased in size and degree of central
cavitation since [DATE] chest CT, contiguous with the carina,
right hilum, subcarinal and lower right paratracheal regions,
compatible with progression of malignancy.
3. Widespread patchy consolidation and tree-in-bud opacity
throughout the right greater than left lungs, favor multilobar
pneumonia, although a component could represent aspiration or
alveolar hemorrhage.
4. Stable mild subcarinal and new mild right infrahilar adenopathy,
cannot exclude nodal metastases.
5. New small dependent right pleural effusion.
6. Stable dilated 4.2 cm ascending thoracic aorta. Recommend annual
imaging followup by CTA or MRA. This recommendation follows [58]
ACCF/AHA/AATS/ACR/ASA/SCA/IWASE/IWASE/IWASE/IWASE Guidelines for the
Diagnosis and Management of Patients with Thoracic Aortic Disease.
Circulation. [58]; 121: [id number]. Aortic aneurysm NOS
([58]-[58]).
7. Dilated main pulmonary artery, suggesting pulmonary arterial
hypertension.
8. Three-vessel coronary atherosclerosis.
9. Small amount of layering debris in fluid in the mid thoracic
esophagus, suggesting esophageal dysmotility and/or gastroesophageal
reflux.
10. Aortic Atherosclerosis ([58]-[58]) and Emphysema
([58]-[58]).

## 2021-06-04 IMAGING — DX DG CHEST 1V PORT
1 series · 1 of 1 positions shown · non-contrast
Comparison: CT chest, [DATE], [DATE] a.m., Chest radiographs,
[DATE], [DATE] a.m.

CLINICAL DATA: Absent left-sided lung sounds

EXAM:
PORTABLE CHEST 1 VIEW

[chest ap]
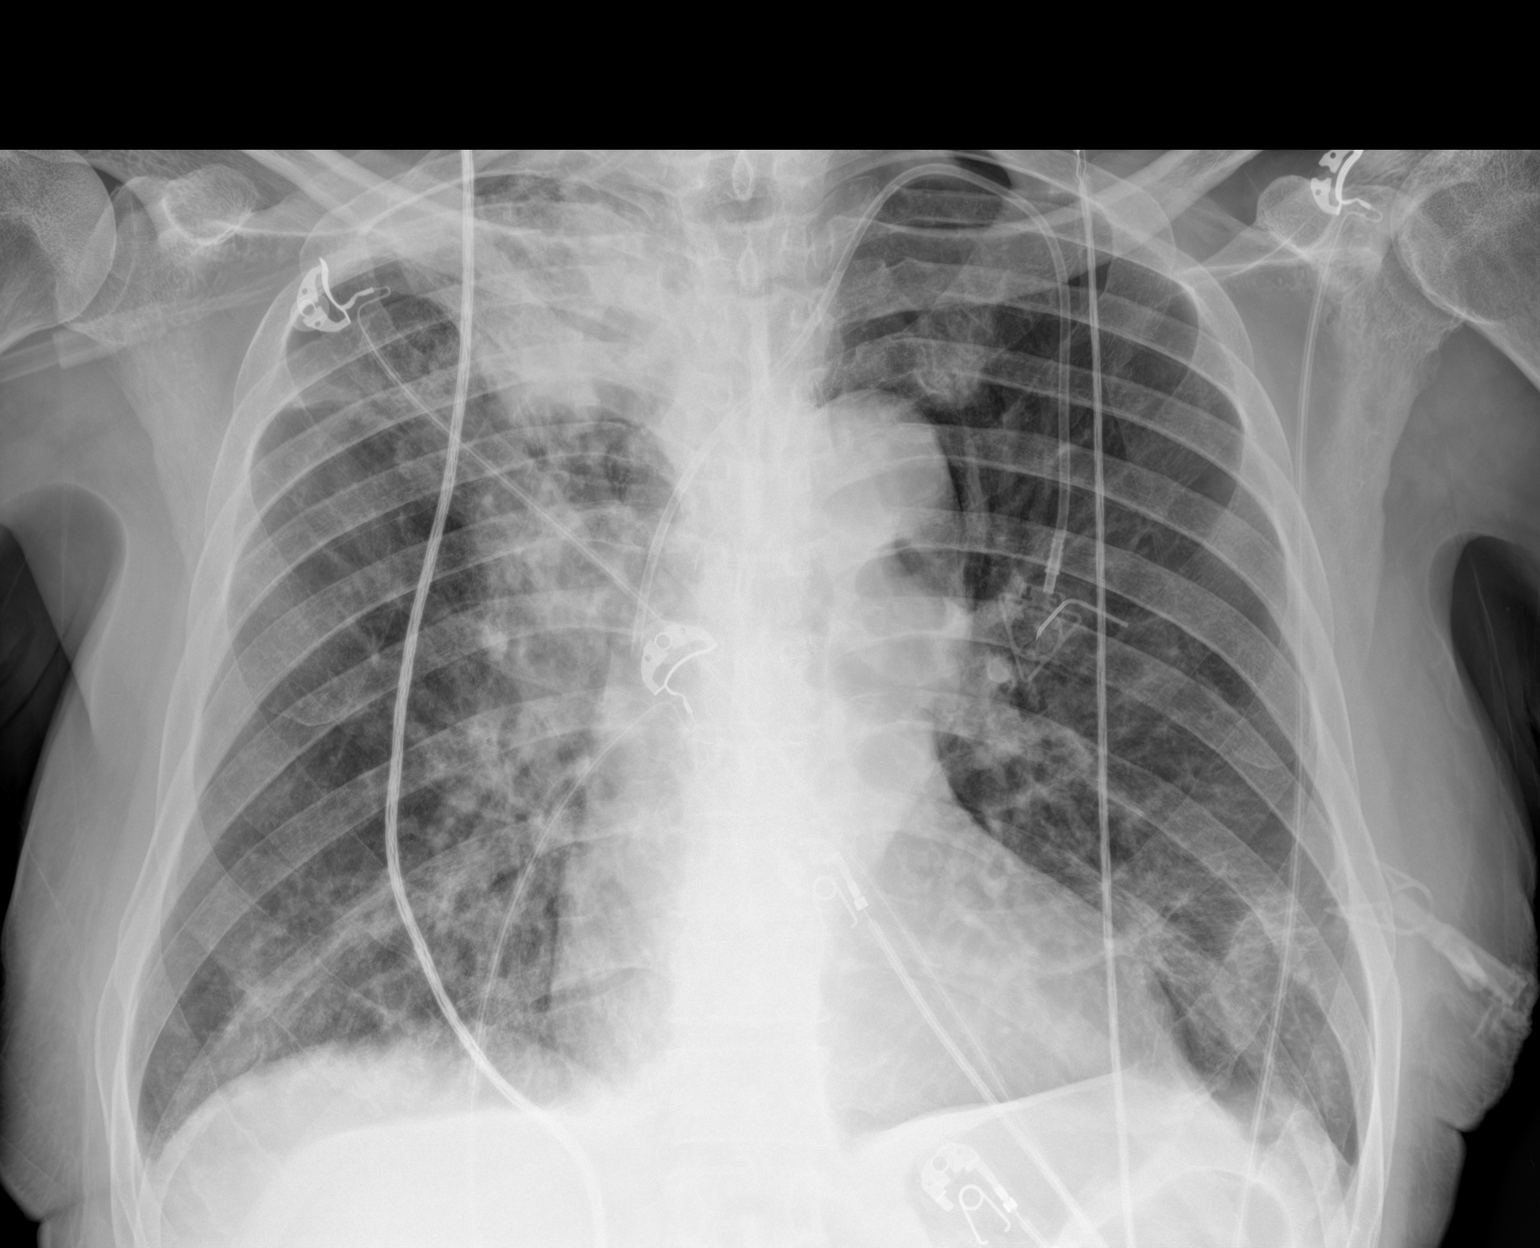

[1 of 1 positions shown; findings below may reference images not displayed]

FINDINGS: The heart size and mediastinal contours are within normal limits.
Left chest port catheter. Unchanged, large mass of the right apex
and irregular opacity of the bilateral lung bases. The visualized
skeletal structures are unremarkable.
IMPRESSION: Unchanged, large mass of the right apex and irregular opacity of the
bilateral lung bases. No new or acute appearing airspace opacity.

## 2021-06-04 IMAGING — US US EXTREM LOW VENOUS
1 series · 14 of 24 positions shown · non-contrast
Comparison: None.

CLINICAL DATA: Leg swelling.

EXAM:
BILATERAL LOWER EXTREMITY VENOUS DOPPLER ULTRASOUND
TECHNIQUE: Gray-scale sonography with compression, as well as color and duplex
ultrasound, were performed to evaluate the deep venous system(s)
from the level of the common femoral vein through the popliteal and
proximal calf veins.

[Series 1: us venous img lower bilat (dvt) · portal-venous · 14 of 57 slices shown]
[im 1/57]
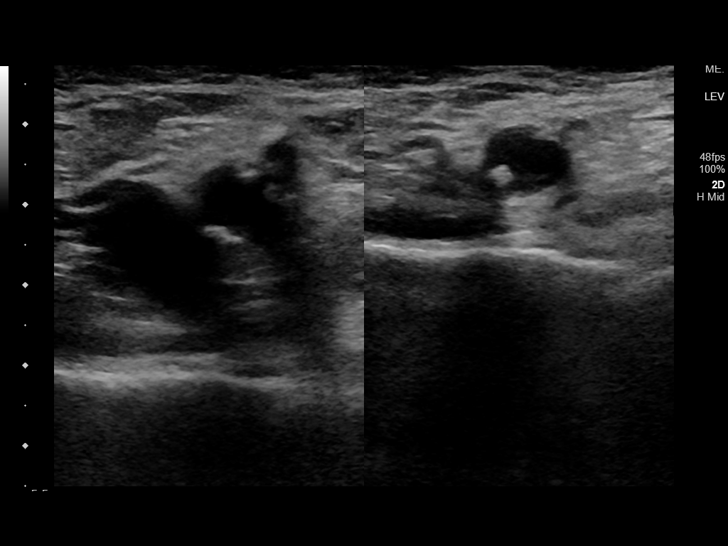
[im 5/57]
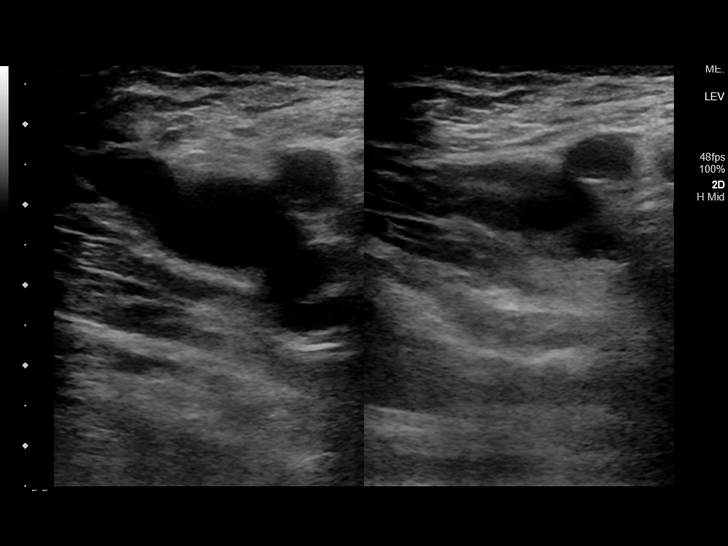
[im 10/57]
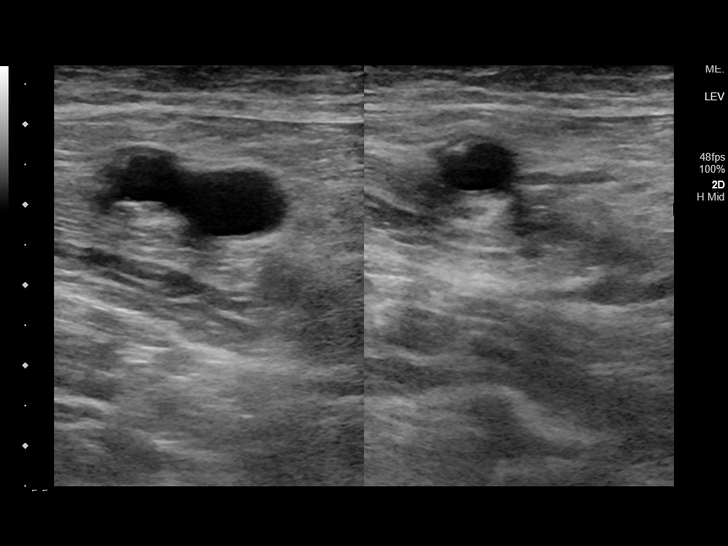
[im 15/57]
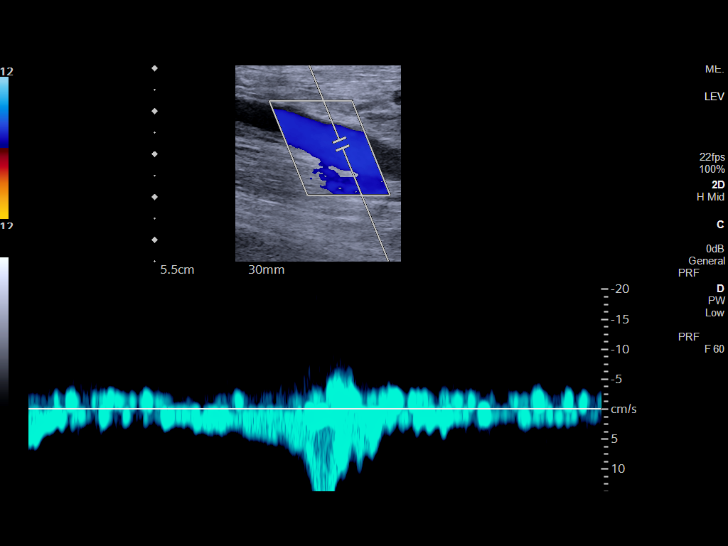
[im 18/57]
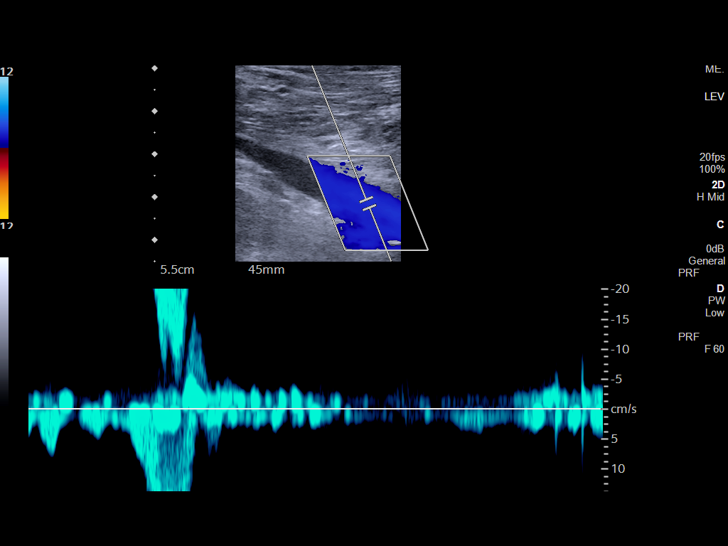
[im 22/57]
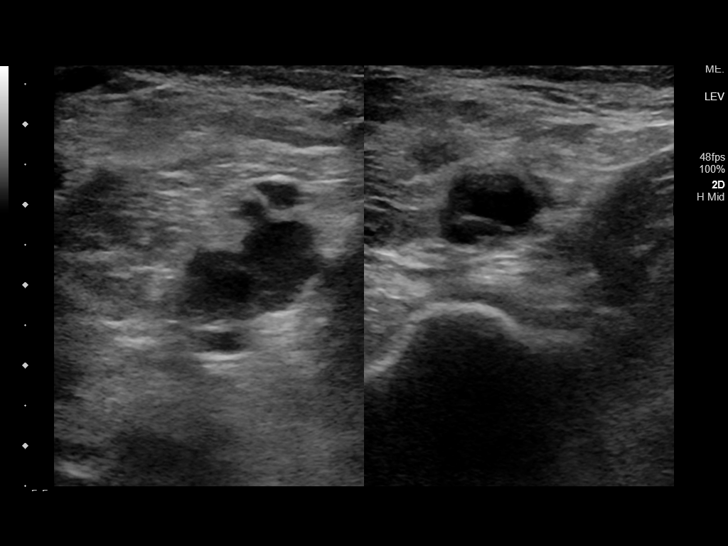
[im 27/57]
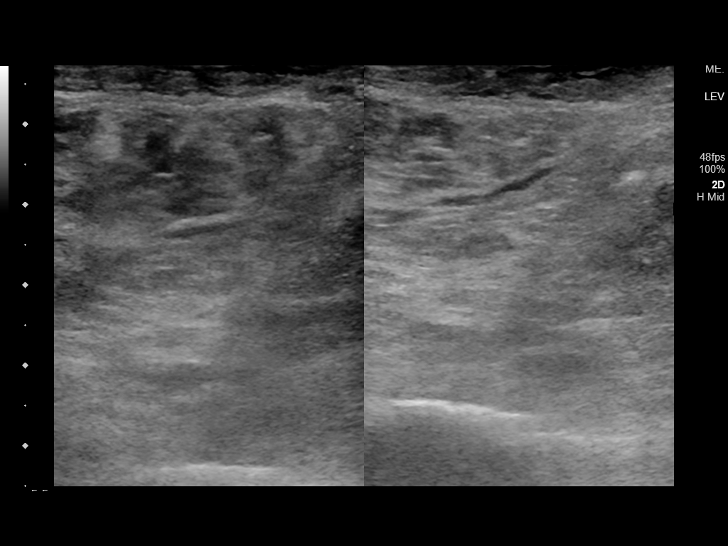
[im 30/57]
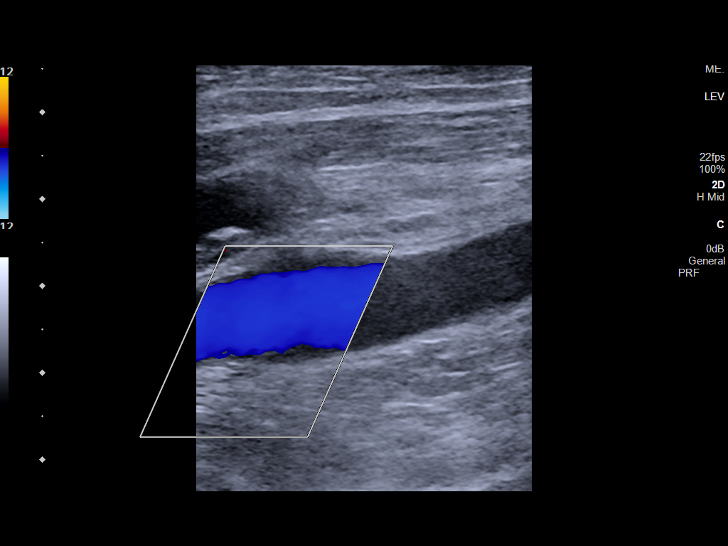
[im 35/57]
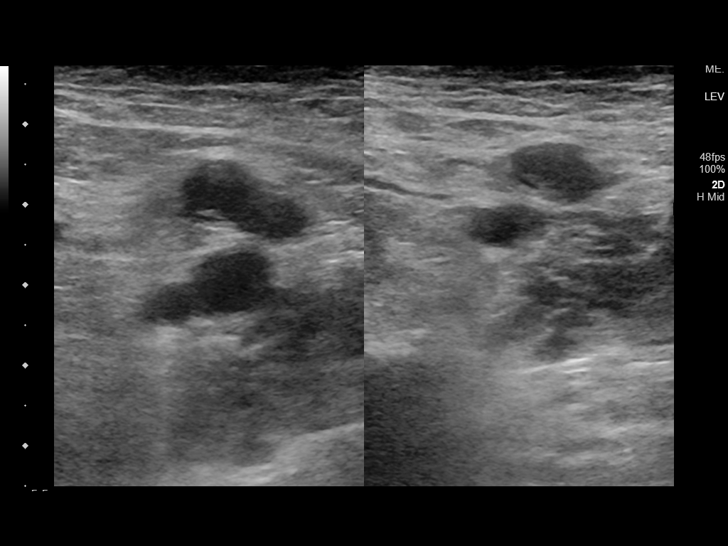
[im 39/57]
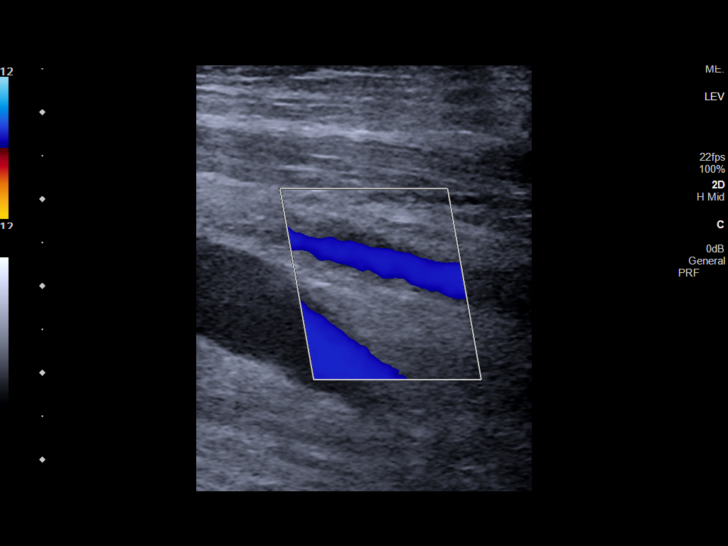
[im 44/57]
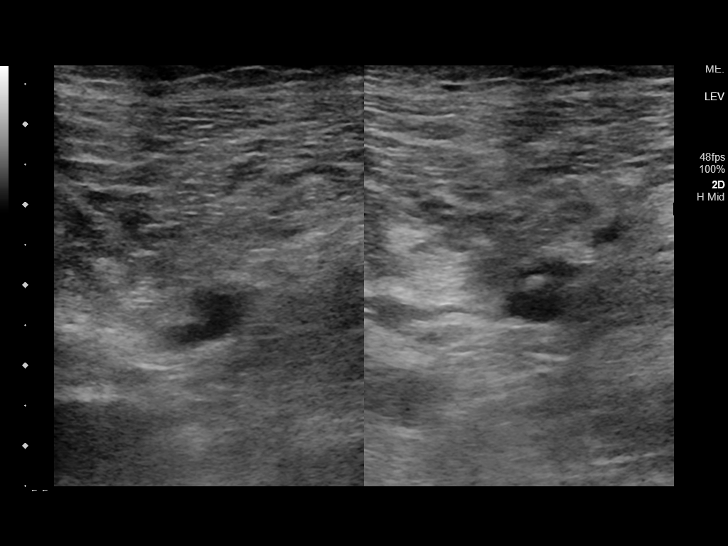
[im 47/57]
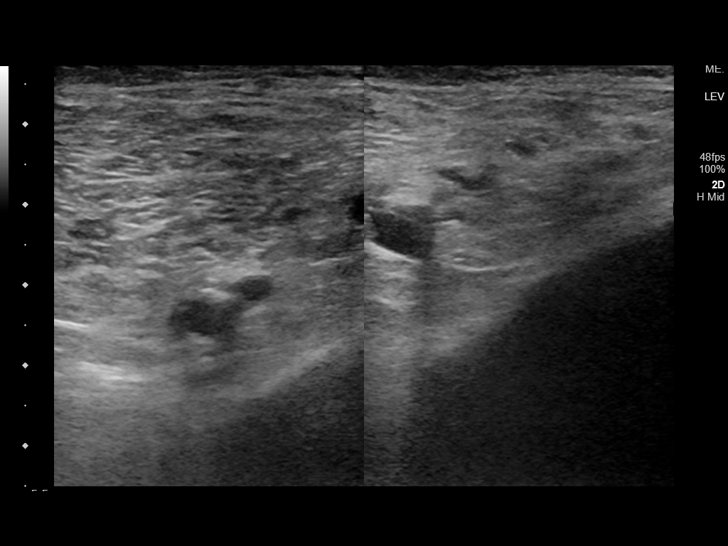
[im 52/57]
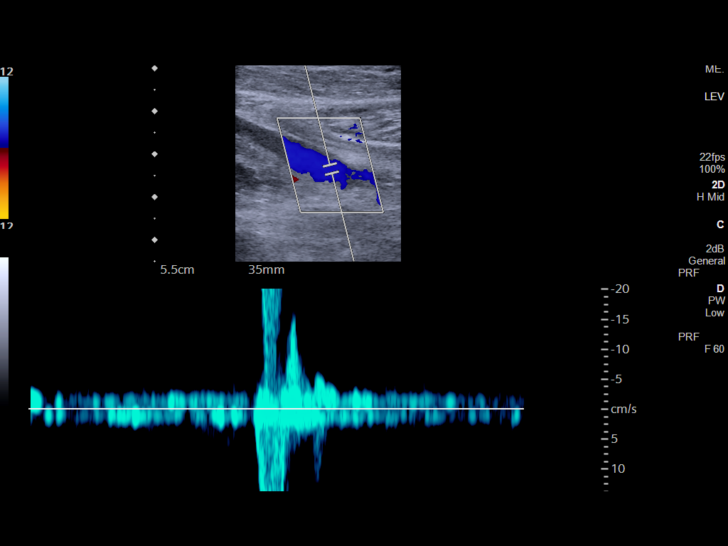
[im 57/57]
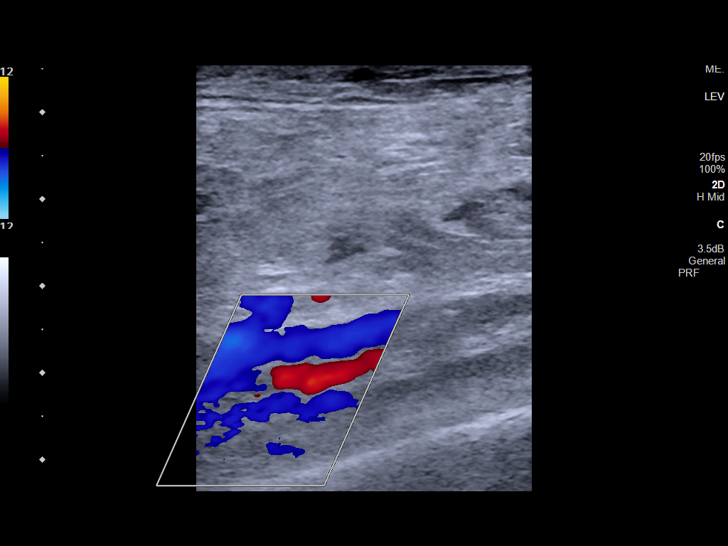

[14 of 24 positions shown; findings below may reference images not displayed]

FINDINGS: VENOUS

Normal compressibility of the common femoral, superficial femoral,
and popliteal veins, as well as the visualized calf veins.
Visualized portions of profunda femoral vein and great saphenous
vein unremarkable. No filling defects to suggest DVT on grayscale or
color Doppler imaging. Doppler waveforms show normal direction of
venous flow, normal respiratory plasticity and response to
augmentation.

OTHER

None.

Limitations: None.
IMPRESSION: 1. Negative.

## 2021-06-04 IMAGING — DX DG CHEST 1V PORT
1 series · 1 of 1 positions shown · non-contrast
Comparison: Chest radiographs [DATE] and earlier.

CLINICAL DATA: 75-year-old male with shortness of breath. History
of treated non-small cell lung cancer.

EXAM:
PORTABLE CHEST 1 VIEW

[chest ap]
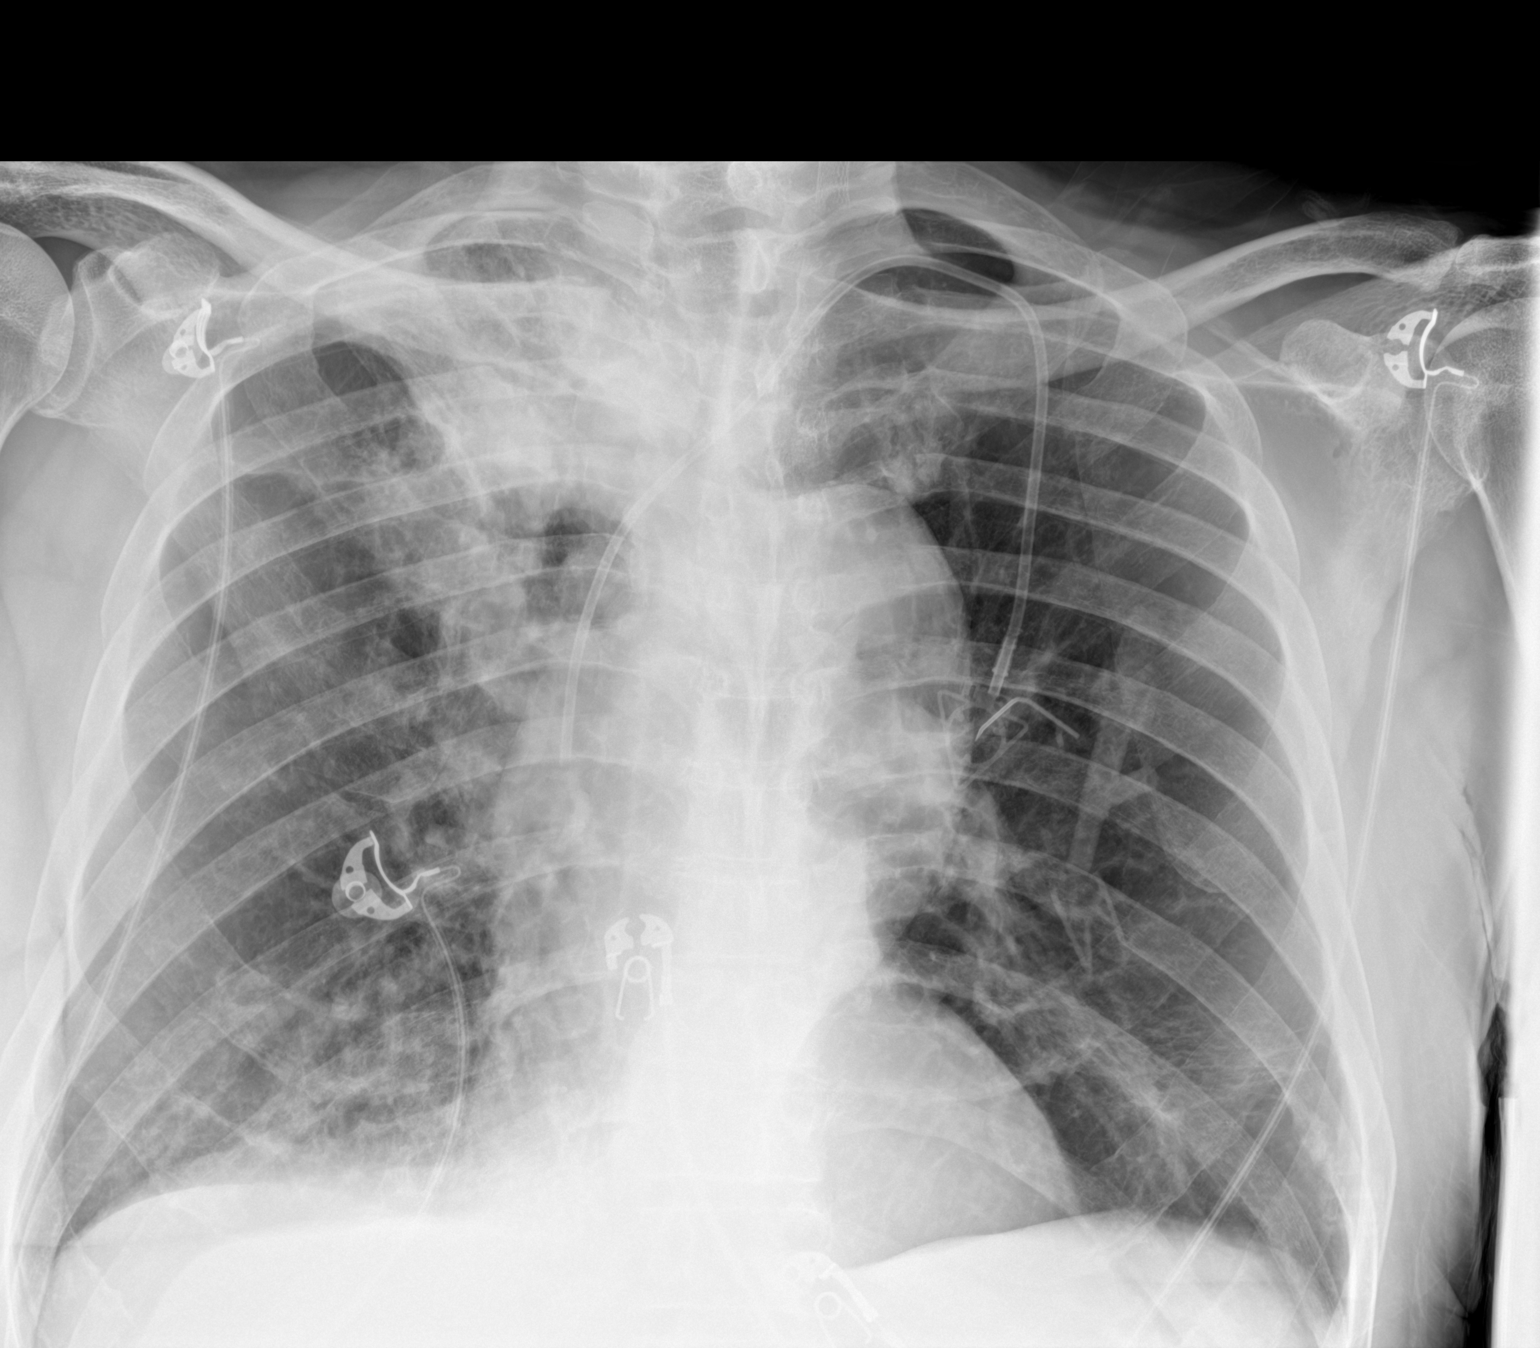

[1 of 1 positions shown; findings below may reference images not displayed]

FINDINGS: Portable AP semi upright view at [6U] hours. Lower lung volumes with
increased confluent right apical lung opacity superimposed on the
post radiation architectural distortion and chronic enlargement of
the right hilum seen last year. No superimposed pneumothorax or
pleural effusion. Stable tortuosity of the thoracic aorta. Other
mediastinal contours are within normal limits. New left chest power
port in place, catheter tip at the lower SVC level. Stable left lung
with mild lower lung scarring. No acute osseous abnormality
identified.
IMPRESSION: 1. Lower lung volumes with nonspecific increased right apical lung
opacity superimposed on treated regional lung cancer.
2. New left chest power port.  Stable left lung.

## 2021-06-04 MED ORDER — VANCOMYCIN HCL 1500 MG/300ML IV SOLN
1500.0000 mg | Freq: Once | INTRAVENOUS | Status: DC
  Filled 2021-06-04: qty 300

## 2021-06-04 MED ORDER — MIRTAZAPINE 15 MG PO TABS: 7.5000 mg | ORAL_TABLET | Freq: Every day | ORAL | Status: DC

## 2021-06-04 MED ORDER — VANCOMYCIN HCL 1750 MG/350ML IV SOLN
1750.0000 mg | INTRAVENOUS | Status: DC
  Administered 2021-06-04: 1750 mg via INTRAVENOUS
  Filled 2021-06-04: qty 350

## 2021-06-04 MED ORDER — ACETAMINOPHEN 500 MG PO TABS
1000.0000 mg | ORAL_TABLET | Freq: Once | ORAL | Status: AC
Start: 2021-06-04 — End: 2021-06-04
  Administered 2021-06-04: 1000 mg via ORAL
  Filled 2021-06-04: qty 2

## 2021-06-04 MED ORDER — MORPHINE SULFATE (PF) 2 MG/ML IV SOLN
2.0000 mg | INTRAVENOUS | Status: DC | PRN
  Administered 2021-06-04: 2 mg via INTRAVENOUS
  Filled 2021-06-04: qty 1

## 2021-06-04 MED ORDER — DEXTROSE 5 % IV SOLN
INTRAVENOUS | Status: AC | PRN
  Administered 2021-06-04: 150 mg via INTRAVENOUS

## 2021-06-04 MED ORDER — DM-GUAIFENESIN ER 30-600 MG PO TB12: 1.0000 | ORAL_TABLET | Freq: Two times a day (BID) | ORAL | Status: DC | PRN

## 2021-06-04 MED ORDER — LIDOCAINE 5 % EX PTCH
1.0000 | MEDICATED_PATCH | CUTANEOUS | Status: DC
  Administered 2021-06-04: 1 via TRANSDERMAL
  Filled 2021-06-04: qty 1

## 2021-06-04 MED ORDER — ALLOPURINOL 100 MG PO TABS
100.0000 mg | ORAL_TABLET | Freq: Every day | ORAL | Status: DC
  Filled 2021-06-04: qty 1

## 2021-06-04 MED ORDER — SODIUM CHLORIDE 0.9 % IV BOLUS: 1000.0000 mL | Freq: Once | INTRAVENOUS | Status: DC

## 2021-06-04 MED ORDER — SCOPOLAMINE 1 MG/3DAYS TD PT72
1.0000 | MEDICATED_PATCH | TRANSDERMAL | Status: DC
  Filled 2021-06-04: qty 1

## 2021-06-04 MED ORDER — METOPROLOL TARTRATE 5 MG/5ML IV SOLN
5.0000 mg | Freq: Once | INTRAVENOUS | Status: AC
  Administered 2021-06-04: 5 mg via INTRAVENOUS

## 2021-06-04 MED ORDER — IPRATROPIUM-ALBUTEROL 0.5-2.5 (3) MG/3ML IN SOLN
3.0000 mL | Freq: Four times a day (QID) | RESPIRATORY_TRACT | Status: DC
  Administered 2021-06-04: 3 mL via RESPIRATORY_TRACT
  Filled 2021-06-04: qty 3

## 2021-06-04 MED ORDER — ATORVASTATIN CALCIUM 20 MG PO TABS: 80.0000 mg | ORAL_TABLET | Freq: Every day | ORAL | Status: DC

## 2021-06-04 MED ORDER — FUROSEMIDE 10 MG/ML IJ SOLN
20.0000 mg | Freq: Once | INTRAMUSCULAR | Status: AC
Start: 2021-06-04 — End: 2021-06-04
  Administered 2021-06-04: 20 mg via INTRAVENOUS
  Filled 2021-06-04: qty 4

## 2021-06-04 MED ORDER — EPINEPHRINE 1 MG/10ML IJ SOSY
PREFILLED_SYRINGE | INTRAMUSCULAR | Status: AC | PRN
  Administered 2021-06-04: .1 mg via INTRAVENOUS

## 2021-06-04 MED ORDER — SUCRALFATE 1 G PO TABS: 1.0000 g | ORAL_TABLET | Freq: Three times a day (TID) | ORAL | Status: DC

## 2021-06-04 MED ORDER — IOHEXOL 350 MG/ML SOLN
75.0000 mL | Freq: Once | INTRAVENOUS | Status: AC | PRN
  Administered 2021-06-04: 75 mL via INTRAVENOUS

## 2021-06-04 MED ORDER — NAPHAZOLINE-PHENIRAMINE 0.025-0.3 % OP SOLN
1.0000 [drp] | Freq: Four times a day (QID) | OPHTHALMIC | Status: DC | PRN
  Filled 2021-06-04: qty 5

## 2021-06-04 MED ORDER — LORAZEPAM 2 MG/ML IJ SOLN
1.0000 mg | INTRAMUSCULAR | Status: DC | PRN
  Administered 2021-06-04: 1 mg via INTRAVENOUS
  Filled 2021-06-04: qty 1

## 2021-06-04 MED ORDER — INSULIN ASPART 100 UNIT/ML IJ SOLN: 0.0000 [IU] | Freq: Every day | INTRAMUSCULAR | Status: DC

## 2021-06-04 MED ORDER — SODIUM CHLORIDE 0.9 % IV SOLN: 2.0000 g | Freq: Three times a day (TID) | INTRAVENOUS | Status: DC

## 2021-06-04 MED ORDER — NICOTINE 21 MG/24HR TD PT24: 21.0000 mg | MEDICATED_PATCH | Freq: Every day | TRANSDERMAL | Status: DC

## 2021-06-04 MED ORDER — VITAMIN B-12 1000 MCG PO TABS: 1000.0000 ug | ORAL_TABLET | Freq: Every day | ORAL | Status: DC

## 2021-06-04 MED ORDER — PANTOPRAZOLE SODIUM 40 MG PO TBEC: 40.0000 mg | DELAYED_RELEASE_TABLET | Freq: Every day | ORAL | Status: DC

## 2021-06-04 MED ORDER — METOPROLOL TARTRATE 5 MG/5ML IV SOLN
2.5000 mg | INTRAVENOUS | Status: DC | PRN
  Filled 2021-06-04: qty 5

## 2021-06-04 MED ORDER — SODIUM CHLORIDE 0.9 % IV SOLN
500.0000 mg | Freq: Once | INTRAVENOUS | Status: AC
  Administered 2021-06-04: 500 mg via INTRAVENOUS
  Filled 2021-06-04: qty 5

## 2021-06-04 MED ORDER — ENSURE MAX PROTEIN PO LIQD
237.0000 mL | Freq: Every day | ORAL | Status: DC
  Filled 2021-06-04: qty 330

## 2021-06-04 MED ORDER — ONDANSETRON HCL 4 MG/2ML IJ SOLN
4.0000 mg | Freq: Three times a day (TID) | INTRAMUSCULAR | Status: DC | PRN
  Administered 2021-06-04: 4 mg via INTRAVENOUS
  Filled 2021-06-04: qty 2

## 2021-06-04 MED ORDER — INSULIN ASPART 100 UNIT/ML IJ SOLN: 0.0000 [IU] | Freq: Three times a day (TID) | INTRAMUSCULAR | Status: DC

## 2021-06-04 MED ORDER — ACETAMINOPHEN 325 MG PO TABS: 650.0000 mg | ORAL_TABLET | Freq: Four times a day (QID) | ORAL | Status: DC | PRN

## 2021-06-04 MED ORDER — SODIUM CHLORIDE 0.9 % IV BOLUS: 500.0000 mL | Freq: Once | INTRAVENOUS | Status: DC

## 2021-06-04 MED ORDER — VANCOMYCIN HCL IN DEXTROSE 1-5 GM/200ML-% IV SOLN: 1000.0000 mg | Freq: Once | INTRAVENOUS | Status: DC

## 2021-06-04 MED ORDER — SODIUM CHLORIDE 0.9 % IV SOLN
2.0000 g | Freq: Once | INTRAVENOUS | Status: AC
  Administered 2021-06-04: 2 g via INTRAVENOUS
  Filled 2021-06-04: qty 2

## 2021-06-04 MED ORDER — LORAZEPAM 2 MG/ML IJ SOLN
1.0000 mg | Freq: Four times a day (QID) | INTRAMUSCULAR | Status: DC | PRN
  Administered 2021-06-04: 1 mg via INTRAVENOUS
  Filled 2021-06-04: qty 1

## 2021-06-04 MED ORDER — ATROPINE SULFATE 1 MG/ML IV SOLN
INTRAVENOUS | Status: AC | PRN
  Administered 2021-06-04: 1 mg via INTRAVENOUS

## 2021-06-04 MED ORDER — DIPHENHYDRAMINE HCL 25 MG PO TABS
25.0000 mg | ORAL_TABLET | Freq: Four times a day (QID) | ORAL | Status: DC | PRN
  Filled 2021-06-04: qty 1

## 2021-06-04 MED ORDER — ALBUTEROL SULFATE (2.5 MG/3ML) 0.083% IN NEBU
2.5000 mg | INHALATION_SOLUTION | RESPIRATORY_TRACT | Status: DC | PRN
  Administered 2021-06-04: 2.5 mg via RESPIRATORY_TRACT
  Filled 2021-06-04: qty 3

## 2021-06-09 LAB — CULTURE, BLOOD (ROUTINE X 2)
Culture: NO GROWTH
Culture: NO GROWTH
Special Requests: ADEQUATE
Special Requests: ADEQUATE

## 2021-06-19 NOTE — ED Notes (Signed)
TOD 2056, RN to notify provider.

## 2021-06-19 NOTE — ED Notes (Signed)
Per intensivist NP pt is comfort care and is now a DNR.

## 2021-06-19 NOTE — ED Provider Notes (Signed)
[facility]  Department of Emergency Medicine   Code Blue CONSULT NOTE  Chief Complaint: Cardiac arrest/unresponsive   Level V Caveat: Unresponsive  History of present illness: I was contacted by the hospital for a CODE BLUE cardiac arrest and presented to the patient's bedside.  Patient has a history of cancer, presented today with chest pain and shortness of breath, CT scan showed lung mass as well as multifocal pneumonia.  CODE STATUS is partial code, DNI but otherwise proceed with advanced life support measures.  Sister at bedside.  ROS: Unable to obtain, Level V caveat  Scheduled Meds:  allopurinol  100 mg Oral Daily   atorvastatin  80 mg Oral q1800   insulin aspart  0-5 Units Subcutaneous QHS   insulin aspart  0-9 Units Subcutaneous TID WC   ipratropium-albuterol  3 mL Nebulization Q6H   lidocaine  1 patch Transdermal Q24H   mirtazapine  7.5 mg Oral QHS   nicotine  21 mg Transdermal Daily   pantoprazole  40 mg Oral Daily   Ensure Max Protein  237 mL Oral Daily   sucralfate  1 g Oral TID WC   [START ON 06/05/2021] vitamin B-12  1,000 mcg Oral Daily   Continuous Infusions:  ceFEPime (MAXIPIME) IV     sodium chloride     vancomycin Stopped (07-03-2021 1537)   PRN Meds:.acetaminophen, albuterol, dextromethorphan-guaiFENesin, diphenhydrAMINE, ondansetron (ZOFRAN) IV Past Medical History:  Diagnosis Date   Arthritis    Bruit of left carotid artery    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   Chronic heart failure (HCC)    Chronic kidney disease    COPD (chronic obstructive pulmonary disease) ([place])    Coronary artery disease    Diabetes mellitus without complication (HCC)    GERD (gastroesophageal reflux disease)    Headache    History of kidney stones    Hyperlipidemia    Hypertension    Lumbago    Neuropathy    Tobacco abuse disorder    Past Surgical History:  Procedure Laterality Date   ARTERY BIOPSY Right 11/14/2020   Procedure: BIOPSY TEMPORAL ARTERY;   Surgeon: Katha Cabal, MD;  Location: [facility];  Service: Vascular;  Laterality: Right;   CAROTID PTA/STENT INTERVENTION Left 01/30/2021   Procedure: CAROTID PTA/STENT INTERVENTION;  Surgeon: Katha Cabal, MD;  Location: [facility];  Service: Cardiovascular;  Laterality: Left;   CHOLECYSTECTOMY     ELECTROMAGNETIC NAVIGATION BROCHOSCOPY N/A 08/19/2017   Procedure: ELECTROMAGNETIC NAVIGATION BRONCHOSCOPY;  Surgeon: Flora Lipps, MD;  Location: [facility];  Service: Cardiopulmonary;  Laterality: N/A;   ELECTROMAGNETIC NAVIGATION BROCHOSCOPY Left 09/02/2017   Procedure: ELECTROMAGNETIC NAVIGATION BRONCHOSCOPY;  Surgeon: Flora Lipps, MD;  Location: [facility];  Service: Cardiopulmonary;  Laterality: Left;   IR IMAGING GUIDED PORT INSERTION  03/29/2021   THROAT SURGERY     VIDEO BRONCHOSCOPY WITH ENDOBRONCHIAL ULTRASOUND N/A 03/06/2021   Procedure: VIDEO BRONCHOSCOPY WITH ENDOBRONCHIAL ULTRASOUND;  Surgeon: Tyler Pita, MD;  Location: [facility];  Service: Cardiopulmonary;  Laterality: N/A;   Social History   Socioeconomic History   Marital status: Widowed    Spouse name: Not on file   Number of children: Not on file   Years of education: Not on file   Highest education level: 10th grade  Occupational History   Not on file  Tobacco Use   Smoking status: Light Smoker    Packs/day: 0.50    Years: 53.00    Pack years: 26.50  Types: Cigarettes   Smokeless tobacco: Never   Tobacco comments:    Aprrox 10 cigs/day--02/21/2021  Vaping Use   Vaping Use: Never used  Substance and Sexual Activity   Alcohol use: No    Alcohol/week: 0.0 standard drinks   Drug use: No   Sexual activity: Not Currently  Other Topics Concern   Not on file  Social History Narrative   Works part time at [employer] retired   Investment banker, operational of Radio broadcast assistant Strain: Low Risk    Difficulty of Paying Living Expenses: Not hard at all  Food Insecurity: No Food Insecurity   Worried  About Charity fundraiser in the Last Year: Never true   Arboriculturist in the Last Year: Never true  Transportation Needs: No Transportation Needs   Lack of Transportation (Medical): No   Lack of Transportation (Non-Medical): No  Physical Activity: Inactive   Days of Exercise per Week: 0 days   Minutes of Exercise per Session: 0 min  Stress: No Stress Concern Present   Feeling of Stress : Not at all  Social Connections: Not on file  Intimate Partner Violence: Not on file   Allergies  Allergen Reactions   Paclitaxel Shortness Of Breath and Hypertension    Patient had a hypersensitivity reaction to Paclitaxel, resulting in ED transport. See note on 04/18/21 at 1230.    Last set of Vital Signs (not current) Vitals:   07/04/21 1612 July 04, 2021 1613  BP:    Pulse: 85 87  Resp: (!) 32 (!) 39  Temp:    SpO2: (!) 82% (!) 86%      Physical Exam  Gen: unresponsive Cardiovascular: pulseless  Resp: apneic. Breath sounds equal bilaterally with bagging  Abd: nondistended  Neuro: GCS 3, unresponsive to pain  HEENT: No blood in posterior pharynx, gag reflex absent  Neck: No crepitus  Musculoskeletal: No deformity  Skin: warm  Procedures   CRITICAL CARE Performed by: Carrie Mew Total critical care time: 35 Critical care time was exclusive of separately billable procedures and treating other patients. Critical care was necessary to treat or prevent imminent or life-threatening deterioration. Critical care was time spent personally by me on the following activities: development of treatment plan with patient and/or surrogate as well as nursing, discussions with consultants, evaluation of patient's response to treatment, examination of patient, obtaining history from patient or surrogate, ordering and performing treatments and interventions, ordering and review of laboratory studies, ordering and review of radiographic studies, pulse oximetry and re-evaluation of patient's  condition.  Cardiopulmonary Resuscitation (CPR) Procedure Note  Directed/Performed by: Carrie Mew I personally directed ancillary staff and/or performed CPR in an effort to regain return of spontaneous circulation and to maintain cardiac, neuro and systemic perfusion.  CPR time of 5 minutes and then ROSC was obtained   ELECTRIC CARDIOVERSION Performed by: Carrie Mew Consent:  The procedure was performed in an emergent situation.  Verbal consent was not obtained.  Written consent was not obtained. Risks and benefits: risks, benefits and alternatives were discussed Consent given by: emergent Patient understanding: patient states understanding of the procedure being performed Patient consent: the patient's understanding of the procedure matches consent given Required items: required blood products, implants, devices, and special equipment available Patient identity confirmed: with wristband Patient sedated: no Sedation type: moderate (conscious) sedation Sedatives:  none Analgesia:  none Cardioversion basis: emergent Pre-procedure rhythm: Ventricular tachycardia Patient position: patient was placed in a supine position Chest area: chest area exposed  Electrodes: pads Electrodes placed: anterior-posterior Number of attempts: 1 Attempt 1 mode: synchronous Attempt 1 waveform: biphasic Attempt 1 shock (in Joules): 200 Attempt 1 outcome: conversion to normal sinus rhythm Post-procedure rhythm: normal sinus rhythm Complications: no complications Patient tolerance: Patient tolerated the procedure well with no immediate complications    Medical Decision making  Patient had cardiac arrest, possible aspiration versus cardiogenic.   He was given 2 A of epinephrine, and obtained ROSC with bradycardia.  He was then given 1 mg of atropine, and developed ventricular tachycardia with a heart rate of 180.  He was then given 150 mg of amiodarone and electrocardioversion was  emergently performed, and patient converted to sinus rhythm with a heart rate of 90..  Remains unresponsive after obtaining ROSC.  Respirations are agonal.  Family counseled that patient is in the dying process and that his passing is imminent.    Assessment and Plan  Hospitalist is in to discuss with family regarding proceeding with comfort measures.    Carrie Mew, MD 06/27/21 1620

## 2021-06-19 NOTE — Code Documentation (Signed)
Cardioverted at 200 joules

## 2021-06-19 NOTE — Progress Notes (Addendum)
PHARMACY -  BRIEF ANTIBIOTIC NOTE   Pharmacy has received consult(s) for vancomycin and cefepime from an ED provider.  The patient's profile has been reviewed for ht/wt/allergies/indication/available labs.    One time order(s) placed for: Cefepime 2 g IV Vancomycin 1750 mg IV   Further antibiotics/pharmacy consults should be ordered by admitting physician if indicated.                       Thank you, Forde Dandy Cyniah Gossard 06/14/2021  10:17 AM

## 2021-06-19 NOTE — ED Triage Notes (Signed)
From home via [facility]. Pt reports difficulty sleeping x1 night and chest soreness where pt gets radiation on R side, Hx lung cancer, last radiation January.

## 2021-06-19 NOTE — Progress Notes (Signed)
°   06-11-2021 1540  Clinical Encounter Type  Visited With Family;Patient not available  Visit Type Initial;Spiritual support;Social support   Daryel November was present on unit when Pt coded. Chaplain B offered support to family; hospitality and offered silent prayer as well as affirmation of family's faith. Chaplain B also checked-in with care team member after performing life-saving intervention.  Chaplain Burris invited family to receive ongoing spiritual care as needed; informed that a chaplain will return at their request via nurse Will recommend for f/u.

## 2021-06-19 NOTE — Death Summary Note (Signed)
DEATH SUMMARY   Patient Details  Name: Philip Wood MRN: [id number] DOB: Apr 10, 1946 VEL:FYBOFBP, Philip Faster, NP  Admission/Discharge Information   Admit Date:  2021/06/30  Date of Death: Date of Death: Jun 30, 2021  Time of Death: Time of Death: Jul 25, 2054  Length of Stay: 1   Principle Cause of death:   HCAP (healthcare-associated pneumonia, malignant neoplasm of bronchus and lung   Hospital Diagnoses: Principal Problem:   Comfort measures only status Active Problems:   HCAP (healthcare-associated pneumonia)   CAD (coronary artery disease)   Elevated troponin   Chest pain   Malignant neoplasm of bronchus and lung ([place])   Diabetes mellitus with chronic kidney disease ([place])   Hyperlipidemia associated with type 2 diabetes mellitus ([place])   Nicotine dependence, cigarettes, w unsp disorders   Gout   Carotid stenosis   HTN (hypertension)   Thrombocytopenia (HCC)   Chronic kidney disease, stage 3a (HCC)   Depression   Chronic combined systolic and diastolic CHF (congestive heart failure) (HCC)   Ascending aortic aneurysm   Normocytic anemia   New onset atrial fibrillation (HCC)   Chronic atrial fibrillation with RVR ([place])   Comatose [facility])   Hospital Course:  Comfort measures only status: pt was initially admitted due to HCAP (healthcare-associated pneumonia). CT angiogram is negative for PE,  but it showed possible bilateral pneumonia.  CT scan cannot completely rule out the possibility of alveolar hemorrhage.  Consulted pulmonologist. He developed new onset A fib with RVR heart rate up to 170s.  Patient was given IV amiodarone and IV metoprolol. Heart rate decreased to 110-120s.  Cannot give anticoagulants due to possible alveolar hemorrhage.  Patient was also given 20 mg of Lasix due to fluid overload and hx of CHF with EF 45-50%. Consulted cardiologist, pending to see patient. Pt was temporarily stabilized, but then deteriorated again. He became unresponsive.  CPR was performed,  patient did regain ROSC, but remains hypotensive with blood pressure in the 80s.  Patient remains in comatose status. No intubation was done according to patient's own wishes.  Consulted the critical care team. NP, Tonye Royalty saw the patient, and had extensive discussion with patient's family including her sisters and step daughter.  Since patient's prognosis is extremely poor, family decided for comfort care.  Patient's family members are very supportive.  This decision is reasonable given his extremely poor prognosis. Pt will be DNR. Patient was then treated as following, with focus on comfort care. Patient finally passed away peacefully.    -Stopped drawing labs and IVF -prn Ativan for agitation -As needed morphine for pain -Naphazoline 0.1 % ophthalmic solution:   -scopolamine 1.5 MG patch  -Psycho/Social: emotional support offered to patient and family at bedside -consult to palliterative care    HCAP (healthcare-associated pneumonia): CT angiogram is negative for PE, showed possible bilateral pneumonia.  CT scan cannot completely rule out the possibility of alveolar hemorrhage.  Consulted pulmonologist.  - IV Vancomycin and cefepime (patient received 1 dose of azithromycin in ED) - Mucinex for cough  - Bronchodilators - Urine legionella and S. pneumococcal antigen - Follow up blood culture x2, sputum culture - will get Procalcitonin and trend lactic acid level  - f/u pulmonologist's recommendation. Dr. Lanney Gins or Dr. Gust Brooms recommendation   CAD (coronary artery disease), chest pain and elevated troponin: trop is minimally elevated 19 --> 18.  Possibly due to demand ischemia.  His chest pain is likely due to pneumonia. -Continued Lipitor -Hold aspirin and Plavix due to possible  alveolar hemorrhage   HTN:  -hold home oral metoprolol and ramipril due to soft Bp   Chronic combined systolic and diastolic CHF: 2D echo on 0/12/3265 showed EF of 45-50% with grade 2 diastolic  dysfunction.  Patient now has fluid retention, with 1+ leg edema, indicating fluid overload. 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 -consulted Dr. Tasia Catchings of oncology   Diabetes mellitus with chronic kidney disease [facility]): Recent A1c 6.0, well controlled.  Patient taking metformin -Sliding scale insulin   Hyperlipidemia associated with type 2 diabetes mellitus ([place]): -Lipitor   Nicotine dependence, cigarettes, w unsp disorders -nicotine patch   Gout -Allopurinol   Carotid stenosis -Hold aspirin and Plavix due to possible ovular hemorrhage -Continued Lipitor   Thrombocytopenia ([place]): Platelet is 74, possibly due to chemotherapy -Follow-up with CBC   Chronic kidney disease, stage 3a ([place]): Stable, creatinine 0.72 -Follow-up with BMP   Depression -Continued home medications   Ascending aortic aneurysm: This is incidental finding by CT angiogram -Follow-up with PCP   Normocytic anemia: Hemoglobin 9.4 --> 8.2. -Follow-up with CBC -Type screen            Procedures: CPR  Consultations: Critical Care, pulmonology and Cardiology  The results of significant diagnostics from this hospitalization (including imaging, microbiology, ancillary and laboratory) are listed below for reference.   Significant Diagnostic Studies: CT Angio Chest PE W and/or Wo Contrast  Result Date: 06-11-21 CLINICAL DATA:  Pulmonary embolism (PE) suspected, high prob. 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 Difficulty sleeping. Chest soreness. EXAM: CT ANGIOGRAPHY CHEST WITH CONTRAST TECHNIQUE: Multidetector CT imaging of the chest was performed using the standard protocol during bolus administration of intravenous contrast. Multiplanar CT image reconstructions and MIPs were obtained to evaluate the vascular anatomy.  RADIATION DOSE REDUCTION: This exam was performed according to the departmental dose-optimization program which includes automated exposure control, adjustment of the mA and/or kV according to patient size and/or use of iterative reconstruction technique. CONTRAST:  53mL OMNIPAQUE IOHEXOL 350 MG/ML SOLN COMPARISON:  02/11/2021 PET-CT.  01/24/2021 chest CT. FINDINGS: Cardiovascular: The study is high quality for the evaluation of pulmonary embolism. There are no filling defects in the central, lobar, segmental or subsegmental pulmonary artery branches to suggest acute pulmonary embolism. Atherosclerotic thoracic aorta with stable dilated 4.2 cm ascending thoracic aorta. Left internal jugular Port-A-Cath terminates at the cavoatrial junction. Dilated main pulmonary artery (3.5 cm diameter). Normal heart size. No significant pericardial fluid/thickening. Three-vessel coronary atherosclerosis. Mediastinum/Nodes: No discrete thyroid nodules. Small amount of layering debris in fluid in the mid thoracic esophagus. No axillary adenopathy. Stable mildly enlarged 1.3 cm subcarinal node (series 4/image 49). Newly mildly enlarged 1.1 cm right infrahilar node (series 4/image 56). No left hilar adenopathy. Lungs/Pleura: No pneumothorax. Small dependent right pleural effusion is new. No left pleural effusion. Moderate centrilobular emphysema. Infiltrative large central perihilar cavitary right upper lobe 8.2 x 6.8 cm lung mass (series 6/image 35), significantly increased from 4.9 x 3.5 cm on 01/24/2021 chest CT and significantly increased in central cavitation, contiguous with the carina, right hilum, subcarinal and lower right paratracheal regions. Increased patchy consolidation throughout the right upper lobe surrounding the central mass. New patchy mild-to-moderate tree-in-bud opacity and small regions of consolidation throughout the remaining right greater than left lungs, most prominent in the basilar right middle lobe and  basilar right lower lobe. Indistinct 1.3 x 0.9 cm anterior left lower lobe pulmonary nodule (series 6/image 76), previously 1.2 x 0.9 cm, not substantially changed. Upper abdomen: No acute abnormality. Musculoskeletal: No aggressive appearing focal osseous lesions. Minimal thoracic spondylosis. Review of the MIP images confirms the above findings. IMPRESSION: 1. No pulmonary embolism. 2. Infiltrative large central perihilar right upper lobe 8.2 x 6.8 cm lung mass, significantly increased in size and degree of central cavitation since 01/24/2021 chest CT, contiguous with the carina, right hilum, subcarinal and lower right paratracheal regions, compatible with progression of malignancy. 3. Widespread patchy consolidation and tree-in-bud opacity throughout the right greater than left lungs, favor multilobar pneumonia, although a component could represent aspiration or alveolar hemorrhage. 4. Stable mild subcarinal and new mild right infrahilar adenopathy, cannot exclude nodal metastases. 5. New small dependent right pleural effusion. 6. Stable dilated 4.2 cm ascending thoracic aorta. Recommend annual imaging followup by CTA or MRA. This recommendation follows 2010 ACCF/AHA/AATS/ACR/ASA/SCA/SCAI/SIR/STS/SVM Guidelines for the Diagnosis and Management of Patients with Thoracic Aortic Disease. Circulation. 2010; 121: J287-O676. Aortic aneurysm NOS (ICD10-I71.9). 7. Dilated main pulmonary artery, suggesting pulmonary arterial hypertension. 8. Three-vessel coronary atherosclerosis. 9. Small amount of layering debris in fluid in the mid thoracic esophagus, suggesting esophageal dysmotility and/or gastroesophageal reflux. 10. Aortic Atherosclerosis (ICD10-I70.0) and Emphysema (ICD10-J43.9). Electronically Signed   By: Ilona Sorrel M.D.   On: June 25, 2021 09:37   US Venous Img Lower Bilateral  Result Date: Jun 25, 2021 CLINICAL DATA:  Leg swelling. EXAM: BILATERAL LOWER EXTREMITY VENOUS DOPPLER ULTRASOUND TECHNIQUE: Gray-scale  sonography with compression, as well as color and duplex ultrasound, were performed to evaluate the deep venous system(s) from the level of the common femoral vein through the popliteal and proximal calf veins. COMPARISON:  None. FINDINGS: VENOUS Normal compressibility of the common femoral, superficial femoral, and popliteal veins, as well as the visualized calf veins. Visualized portions of profunda femoral vein and great saphenous vein unremarkable. No filling defects to suggest DVT on grayscale or color Doppler imaging. Doppler waveforms show normal direction of venous flow, normal respiratory plasticity and response to augmentation. OTHER None. Limitations: None. IMPRESSION: 1. Negative. Electronically Signed   By: Titus Dubin M.D.   On: 06/25/21 08:34   DG Chest Portable 1 View  Result Date: 06/25/21 CLINICAL DATA:  Absent left-sided lung sounds EXAM: PORTABLE CHEST 1 VIEW COMPARISON:  CT chest, 06-25-21, 9:13 a.m., Chest radiographs, 25-Jun-2021, 7:46 a.m. FINDINGS: The heart size and mediastinal contours are within normal limits. Left chest port catheter. Unchanged, large mass of the right apex and irregular opacity of the bilateral lung bases. The visualized skeletal structures are unremarkable. IMPRESSION: Electronically Signed   By: Delanna Ahmadi M.D.   On: June 25, 2021 14:38   DG Chest Portable 1 View  Result Date: 2021-06-25 CLINICAL DATA:  76 year old male with shortness of breath. History of treated non-small cell lung cancer. EXAM: PORTABLE CHEST 1 VIEW COMPARISON:  Chest radiographs 03/06/2021 and earlier. FINDINGS: Portable AP semi upright view at 0746 hours. Lower lung volumes with increased confluent right apical lung opacity superimposed on the post radiation architectural distortion and chronic enlargement of the right hilum seen last year. No superimposed pneumothorax or pleural effusion. Stable tortuosity of the thoracic aorta. Other mediastinal contours are within normal  limits. New left chest power port in place, catheter tip at the lower SVC level. Stable left lung with mild lower lung scarring. No acute osseous abnormality identified. IMPRESSION: 1. Lower lung volumes with nonspecific increased right apical lung  opacity superimposed on treated regional lung cancer. 2. New left chest power port.  Stable left lung. Electronically Signed   By: Genevie Ann M.D.   On: June 22, 2021 07:57    Microbiology: Recent Results (from the past 240 hour(s))  Resp Panel by RT-PCR (Flu A&B, Covid) Nasopharyngeal Swab     Status: None   Collection Time: 06/22/2021  7:44 AM   Specimen: Nasopharyngeal Swab; Nasopharyngeal(NP) swabs in vial transport medium  Result Value Ref Range Status   SARS Coronavirus 2 by RT PCR NEGATIVE NEGATIVE Final    Comment: (NOTE) SARS-CoV-2 target nucleic acids are NOT DETECTED.  The SARS-CoV-2 RNA is generally detectable in upper respiratory specimens during the acute phase of infection. The lowest concentration of SARS-CoV-2 viral copies this assay can detect is 138 copies/mL. A negative result does not preclude SARS-Cov-2 infection and should not be used as the sole basis for treatment or other patient management decisions. A negative result may occur with  improper specimen collection/handling, submission of specimen other than nasopharyngeal swab, presence of viral mutation(s) within the areas targeted by this assay, and inadequate number of viral copies(<138 copies/mL). A negative result must be combined with clinical observations, patient history, and epidemiological information. The expected result is Negative.  Fact Sheet for Patients:  EntrepreneurPulse.com.au  Fact Sheet for Healthcare Providers:  IncredibleEmployment.be  This test is no t yet approved or cleared by the [place] FDA and  has been authorized for detection and/or diagnosis of SARS-CoV-2 by FDA under an Emergency Use Authorization  (EUA). This EUA will remain  in effect (meaning this test can be used) for the duration of the COVID-19 declaration under Section 564(b)(1) of the Act, 21 U.S.C.section 360bbb-3(b)(1), unless the authorization is terminated  or revoked sooner.       Influenza A by PCR NEGATIVE NEGATIVE Final   Influenza B by PCR NEGATIVE NEGATIVE Final    Comment: (NOTE) The Xpert Xpress SARS-CoV-2/FLU/RSV plus assay is intended as an aid in the diagnosis of influenza from Nasopharyngeal swab specimens and should not be used as a sole basis for treatment. Nasal washings and aspirates are unacceptable for Xpert Xpress SARS-CoV-2/FLU/RSV testing.  Fact Sheet for Patients: EntrepreneurPulse.com.au  Fact Sheet for Healthcare Providers: IncredibleEmployment.be  This test is not yet approved or cleared by the [place] FDA and has been authorized for detection and/or diagnosis of SARS-CoV-2 by FDA under an Emergency Use Authorization (EUA). This EUA will remain in effect (meaning this test can be used) for the duration of the COVID-19 declaration under Section 564(b)(1) of the Act, 21 U.S.C. section 360bbb-3(b)(1), unless the authorization is terminated or revoked.  Performed at [facility], [address] [place], [place] [address]   MRSA Next Gen by PCR, Nasal     Status: None   Collection Time: 06/22/21  7:53 AM   Specimen: Nasal Mucosa; Nasal Swab  Result Value Ref Range Status   MRSA by PCR Next Gen NOT DETECTED NOT DETECTED Final    Comment: (NOTE) The GeneXpert MRSA Assay (FDA approved for NASAL specimens only), is one component of a comprehensive MRSA colonization surveillance program. It is not intended to diagnose MRSA infection nor to guide or monitor treatment for MRSA infections. Test performance is not FDA approved in patients less than 56 years old. Performed at [facility], [address], [place], [place]  [address]     Time spent: 25 minutes  Signed: Ivor Costa, MD 2021/06/22

## 2021-06-19 NOTE — ED Notes (Signed)
Pt resting on bed at this time. Pt continues to use accessory muscles to breath while on 4 l/min. Pt O2 sat is fluctuating between 88-95 percent on the 4l/min via [place].

## 2021-06-19 NOTE — ED Notes (Addendum)
Pt squeezing RN with right hand at this time. Pt is not receptive of moving left arm.

## 2021-06-19 NOTE — Code Documentation (Signed)
CPR started.

## 2021-06-19 NOTE — Code Documentation (Signed)
NRB applied to pt at 15 liters per min. Pt breathing very shallow and weak.

## 2021-06-19 NOTE — ED Provider Notes (Signed)
[facility] Provider Note    Event Date/Time   First MD Initiated Contact with Patient 07/01/21 [phone]     (approximate)   History   Not sleeping well   HPI  Philip Wood is a 76 y.o. male with history of squamous cell carcinoma of the larynx who is status post radiation who then in 2019/2020 underwent radiation to a lesion in his lung.  To note he did have an MRI in December 2022 that was negative for metastatic disease.  Patient is currently on chemotherapy and radiation with single agent carbo platinum.  I reviewed patient's prior heart rates and they are typically in the 80s to 90s.  Patient initially reported that he came in because he could not sleep but upon further explanation is that he cannot sleep due to pain in his chest.  Patient reports having some chest pain on the right side of his chest that started this morning as well as coughing up blood this morning.  Denies ever having this happen before.  He also reports some swelling in his legs that is new.  He denies currently being on any chemotherapy and radiation.  He states it was last given in January.   Physical Exam   Triage Vital Signs: ED Triage Vitals  Enc Vitals Group     BP Jul 01, 2021 0718 123/84     Pulse Rate 01-Jul-2021 0718 (!) 113     Resp 01-Jul-2021 0718 20     Temp 07-01-21 0718 (!) 97.5 F (36.4 C)     Temp Source 01-Jul-2021 0718 Oral     SpO2 2021/07/01 0718 100 %     Weight 2021-07-01 0717 160 lb (72.6 kg)     Height 01-Jul-2021 0717 5\' 9"  (1.753 m)     Head Circumference --      Peak Flow --      Pain Score 07/01/2021 0717 3     Pain Loc --      Pain Edu? --      Excl. in [place]? --     Most recent vital signs: Vitals:   Jul 01, 2021 0718  BP: 123/84  Pulse: (!) 113  Resp: 20  Temp: (!) 97.5 F (36.4 C)  SpO2: 100%     General: Awake, no distress.  CV:  Good peripheral perfusion. tachy Resp:  Normal effort.  Clear lung Abd:  No distention.  Soft and nontender Other:  2+  swelling noted bilaterally with the right slightly greater than the left   ED Results / Procedures / Treatments   Labs (all labs ordered are listed, but only abnormal results are displayed) Labs Reviewed  CBC WITH DIFFERENTIAL/PLATELET - Abnormal; Notable for the following components:      Result Value   RBC 2.73 (*)    Hemoglobin 8.2 (*)    HCT 25.5 (*)    RDW 15.9 (*)    Platelets 74 (*)    Lymphs Abs 0.6 (*)    All other components within normal limits  COMPREHENSIVE METABOLIC PANEL - Abnormal; Notable for the following components:   Sodium 134 (*)    Glucose, Bld 154 (*)    BUN 41 (*)    Calcium 8.2 (*)    Total Protein 5.4 (*)    Albumin 1.7 (*)    All other components within normal limits  BRAIN NATRIURETIC PEPTIDE - Abnormal; Notable for the following components:   B Natriuretic Peptide 250.6 (*)    All other components  within normal limits  PROTIME-INR - Abnormal; Notable for the following components:   Prothrombin Time 16.2 (*)    INR 1.3 (*)    All other components within normal limits  APTT - Abnormal; Notable for the following components:   aPTT 45 (*)    All other components within normal limits  TROPONIN I (HIGH SENSITIVITY) - Abnormal; Notable for the following components:   Troponin I (High Sensitivity) 19 (*)    All other components within normal limits  TROPONIN I (HIGH SENSITIVITY) - Abnormal; Notable for the following components:   Troponin I (High Sensitivity) 18 (*)    All other components within normal limits  RESP PANEL BY RT-PCR (FLU A&B, COVID) ARPGX2  CULTURE, BLOOD (ROUTINE X 2)  CULTURE, BLOOD (ROUTINE X 2)  MRSA NEXT GEN BY PCR, NASAL  LACTIC ACID, PLASMA  LACTIC ACID, PLASMA  PROCALCITONIN  TYPE AND SCREEN     EKG  My interpretation of EKG:  Tachycardia rate of 114 without any ST elevation or T wave inversions.  PVC noted in  RADIOLOGY I have reviewed the xray personally and agree with radiology read right apical lung opacity  from known cancer.  Chest port  CT PE reviewed but pending result  DVT ultrasound negative  PROCEDURES:  Critical Care performed:  .1-3 Lead EKG Interpretation Performed by: Vanessa Diamond, MD Authorized by: Vanessa Calverton, MD     Interpretation: normal     ECG rate:  90   ECG rate assessment: normal     Rhythm: sinus rhythm     Ectopy: none     Conduction: normal   Comments:     Initially sent tachycardia but came down to normal sinus   MEDICATIONS ORDERED IN ED: Medications  lidocaine (LIDODERM) 5 % 1 patch (1 patch Transdermal Patch Applied 2021-06-18 0756)  ceFEPIme (MAXIPIME) 2 g in sodium chloride 0.9 % 100 mL IVPB (2 g Intravenous New Bag/Given 06-18-21 1031)  azithromycin (ZITHROMAX) 500 mg in sodium chloride 0.9 % 250 mL IVPB (has no administration in time range)  vancomycin (VANCOREADY) IVPB 1500 mg/300 mL (has no administration in time range)  acetaminophen (TYLENOL) tablet 1,000 mg (1,000 mg Oral Given 18-Jun-2021 0756)  iohexol (OMNIPAQUE) 350 MG/ML injection 75 mL (75 mLs Intravenous Contrast Given 06-18-2021 0903)     IMPRESSION / MDM / ASSESSMENT AND PLAN / ED COURSE  I reviewed the triage vital signs and the nursing notes.                              Patient with significant lung cancer who comes in with tachycardia, leg swelling. Differential diagnosis includes, but is not limited to, pulmonary embolism.  Given high concern we will get CT imaging and forego D-dimer that would most likely be positive and urine ways from his cancer.  Also get ultrasound to evaluate his leg swelling We will get BNP to evaluate for heart failure, labs to evaluate for any ACS to get COVID, flu test. We will give patient some Tylenol and lidocaine patch to help with pain   Patient CT scan does not show evidence of PE but does show worsening lung mass.  There is also concern for some patchy consolidations that are concerning for multilobar pneumonia versus aspiration versus alveolar  hemorrhage.  He also has a small pleural effusion, stable aortic aneurysm which I have discussed with family.   Hemoglobin is dropped down to  8.2.  1 month ago he was 11.3.  Rectal exam patient had brown stool Hemoccult negative.  Given the concern for possible [place] or hemorrhage I did discuss the case with pulmonary.  I will start him on antibiotics for possible pneumonia and add on procalcitonin.  Patient does not meet sepsis criteria at this time but will get blood cultures, lactate.  Patient's maps are above 65.  Slightly low systolic but I am hesitant to start fluids given he is afebrile, well-appearing and normal mentation.  He is got significant we reduced EF with significant swelling noted in his legs.  This could be from his low albumin but at this time I think we will hold off and see what his pressures do with the fluid bolus from the antibiotics.  I have attempted to page pulmonary but has not been able to get a hold of them so I also sent them a message.  I will discuss with hospital team for admission    The patient is on the cardiac monitor to evaluate for evidence of arrhythmia and/or significant heart rate changes.  FINAL CLINICAL IMPRESSION(S) / ED DIAGNOSES   Final diagnoses:  Anemia, unspecified type  Community acquired pneumonia, unspecified laterality  Malignant neoplasm of lung, unspecified laterality, unspecified part of lung ([place])     Rx / DC Orders   ED Discharge Orders     None        Note:  This document was prepared using Dragon voice recognition software and may include unintentional dictation errors.   Vanessa Bath, MD 06/25/2021 1048

## 2021-06-19 NOTE — Code Documentation (Signed)
Family updated as to patient's status. Family at bedside.  

## 2021-06-19 NOTE — H&P (Addendum)
History and Physical    MINOR IDEN DGL:[id number] DOB: 1945-11-06 DOA: 06/25/2021  Referring MD/NP/PA:   PCP: Venita Lick, NP   Patient coming from:  The patient is coming from home.  At baseline, pt is independent for most of ADL.        Chief Complaint: chest pain, SOB   HPI: Philip Wood is a 76 y.o. male with medical history significant of malignant neoplasm of bronchus and lung (on chemo and radiation therapy, last treatment is in January), hypertension, hyperlipidemia, diabetes mellitus, COPD, GERD, gout, depression, tobacco abuse, kidney stone, CAD, sCHF with EF of 45-50%, left carotid artery stenosis, CKD stage IIIa, thrombocytopenia, who presents with chest pain, shortness breath  Patient initially reports that he had difficult sleeping last night, but detailed questioning reviewed that patient has shortness of breath and chest pain.  He has cough with pinkish colored sputum production.  His chest pain is located in the front and right side of the chest, intermittent, sharp, 10 out of 10 in severity, pleuritic, aggravated by deep breath and coughing, sometimes radiating to the shoulders.  Patient does not have fever or chills.  Denies nausea, vomiting, diarrhea or abdominal pain.  No symptoms of UTI.  Patient has bilateral leg edema. Pt has Port-A in the right upper chest.  Data Reviewed and ED Course: pt was found to have WBC 5.7, BNP 250, troponin level 19, negative COVID PCR, negative FOBT, hemoglobin 8.2 (9.4 on 05/21/2021), GFR > 60, blood pressure 91/61, 99/65, heart rate 113, 93, RR 20, oxygen saturation 95% on room air.  Chest x-ray showed low volume.  CT angiogram is negative for PE, but showed bilateral infiltration, cannot rule out possibility of alveolar hemorrhage.  Lower extremity venous Dopplers negative for DVT.  Patient is admitted to progressive bed as inpatient. Dr. Tasia Catchings of oncology is consulted.  Message sent to pulmonologist for consultation.  CTA: 1. No  pulmonary embolism. 2. Infiltrative large central perihilar right upper lobe 8.2 x 6.8 cm lung mass, significantly increased in size and degree of central cavitation since 01/24/2021 chest CT, contiguous with the carina, right hilum, subcarinal and lower right paratracheal regions, compatible with progression of malignancy. 3. Widespread patchy consolidation and tree-in-bud opacity throughout the right greater than left lungs, favor multilobar pneumonia, although a component could represent aspiration or alveolar hemorrhage. 4. Stable mild subcarinal and new mild right infrahilar adenopathy, cannot exclude nodal metastases. 5. New small dependent right pleural effusion. 6. Stable dilated 4.2 cm ascending thoracic aorta. Recommend annual imaging followup by CTA or MRA. This recommendation follows 2010 ACCF/AHA/AATS/ACR/ASA/SCA/SCAI/SIR/STS/SVM Guidelines for the Diagnosis and Management of Patients with Thoracic Aortic Disease. Circulation. 2010; 121: J188-C166. Aortic aneurysm NOS (ICD10-I71.9). 7. Dilated main pulmonary artery, suggesting pulmonary arterial hypertension. 8. Three-vessel coronary atherosclerosis. 9. Small amount of layering debris in fluid in the mid thoracic esophagus, suggesting esophageal dysmotility and/or gastroesophageal reflux. 10. Aortic Atherosclerosis (ICD10-I70.0) and Emphysema (ICD10-J43.9).    EKG: I have personally reviewed.  Sinus rhythm, QTc 431, heart rate 114, PVC, nonspecific T wave change   Review of Systems:   General: no fevers, chills, no body weight gain, has fatigue HEENT: no blurry vision, hearing changes or sore throat Respiratory: has dyspnea, coughing, no wheezing CV: has chest pain, no palpitations GI: no nausea, vomiting, abdominal pain, diarrhea, constipation GU: no dysuria, burning on urination, increased urinary frequency, hematuria  Ext: has leg edema Neuro: no unilateral weakness, numbness, or tingling, no vision change or  hearing  loss Skin: no rash, no skin tear. MSK: No muscle spasm, no deformity, no limitation of range of movement in spin Heme: No easy bruising.  Travel history: No recent long distant travel.   Allergy:  Allergies  Allergen Reactions   Paclitaxel Shortness Of Breath and Hypertension    Patient had a hypersensitivity reaction to Paclitaxel, resulting in ED transport. See note on 04/18/21 at 1230.    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   Chronic heart failure (HCC)    Chronic kidney disease    COPD (chronic obstructive pulmonary disease) (HCC)    Coronary artery disease    Diabetes mellitus without complication (HCC)    GERD (gastroesophageal reflux disease)    Headache    History of kidney stones    Hyperlipidemia    Hypertension    Lumbago    Neuropathy    Tobacco abuse disorder     Past Surgical History:  Procedure Laterality Date   ARTERY BIOPSY Right 11/14/2020   Procedure: BIOPSY TEMPORAL ARTERY;  Surgeon: Katha Cabal, MD;  Location: [facility];  Service: Vascular;  Laterality: Right;   CAROTID PTA/STENT INTERVENTION Left 01/30/2021   Procedure: CAROTID PTA/STENT INTERVENTION;  Surgeon: Katha Cabal, MD;  Location: [facility];  Service: Cardiovascular;  Laterality: Left;   CHOLECYSTECTOMY     ELECTROMAGNETIC NAVIGATION BROCHOSCOPY N/A 08/19/2017   Procedure: ELECTROMAGNETIC NAVIGATION BRONCHOSCOPY;  Surgeon: Flora Lipps, MD;  Location: [facility];  Service: Cardiopulmonary;  Laterality: N/A;   ELECTROMAGNETIC NAVIGATION BROCHOSCOPY Left 09/02/2017   Procedure: ELECTROMAGNETIC NAVIGATION BRONCHOSCOPY;  Surgeon: Flora Lipps, MD;  Location: [facility];  Service: Cardiopulmonary;  Laterality: Left;   IR IMAGING GUIDED PORT INSERTION  03/29/2021   THROAT SURGERY     VIDEO BRONCHOSCOPY WITH ENDOBRONCHIAL ULTRASOUND N/A 03/06/2021   Procedure: VIDEO BRONCHOSCOPY WITH ENDOBRONCHIAL  ULTRASOUND;  Surgeon: Tyler Pita, MD;  Location: [facility];  Service: Cardiopulmonary;  Laterality: N/A;    Social History:  reports that he has been smoking cigarettes. He has a 26.50 pack-year smoking history. He has never used smokeless tobacco. He reports that he does not drink alcohol and does not use drugs.  Family History:  Family History  Problem Relation Age of Onset   Heart disease Father    Heart attack Father    Brain cancer Sister    Cervical cancer Sister    Stomach cancer Sister    Stomach cancer Sister    Diabetes Sister    Lupus Sister    Rectal cancer Brother    Lung cancer Brother    Lung cancer Brother    Lung cancer Brother    Lung cancer Brother      Prior to Admission medications   Medication Sig Start Date End Date Taking? Authorizing Provider  acetaminophen (TYLENOL) 650 MG CR tablet Take 1,300 mg by mouth every 8 (eight) hours as needed for pain.    [provider]  albuterol (VENTOLIN HFA) 108 (90 Base) MCG/ACT inhaler Inhale 2 puffs into the lungs every 6 (six) hours as needed for wheezing or shortness of breath. 04/01/21   Tyler Pita, MD  allopurinol (ZYLOPRIM) 100 MG tablet Take 1 tablet (100 mg total) by mouth daily. Patient taking differently: Take 100 mg by mouth in the morning. 05/31/20   Marnee Guarneri T, NP  aspirin EC 81 MG tablet Take 81  mg by mouth in the morning. Swallow whole.    [provider]  atorvastatin (LIPITOR) 80 MG tablet Take 1 tablet (80 mg total) by mouth daily at 6 PM. 05/31/20   Cannady, Henrine Screws T, NP  clopidogrel (PLAVIX) 75 MG tablet Take 1 tablet (75 mg total) by mouth daily. 12/07/20   Kris Hartmann, NP  diphenhydrAMINE (BENADRYL ALLERGY) 25 MG tablet Take 1 tablet (25 mg total) by mouth every 6 (six) hours as needed. 04/18/21   Rada Hay, MD  Ensure (ENSURE) Take 237 mLs by mouth daily.    [provider]  lidocaine-prilocaine (EMLA) cream Apply to affected area once  03/22/21   Earlie Server, MD  metFORMIN (GLUCOPHAGE) 500 MG tablet Take 1 tablet (500 mg total) by mouth 2 (two) times daily with a meal. 01/08/21   Cannady, Henrine Screws T, NP  metoprolol succinate (TOPROL XL) 25 MG 24 hr tablet Take 1 tablet (25 mg total) by mouth daily. Patient taking differently: Take 25 mg by mouth in the morning. 05/31/20   Cannady, Henrine Screws T, NP  mirtazapine (REMERON) 7.5 MG tablet Take 1 tablet (7.5 mg total) by mouth at bedtime. 01/08/21   Cannady, Henrine Screws T, NP  omeprazole (PRILOSEC) 20 MG capsule Take 1 capsule (20 mg total) by mouth daily. 05/16/21   Earlie Server, MD  prochlorperazine (COMPAZINE) 10 MG tablet Take 1 tablet (10 mg total) by mouth every 6 (six) hours as needed (Nausea or vomiting). 03/22/21   Earlie Server, MD  ramipril (ALTACE) 10 MG capsule Take 1 capsule (10 mg total) by mouth daily. Patient taking differently: Take 10 mg by mouth in the morning. 05/31/20   Cannady, Henrine Screws T, NP  sucralfate (CARAFATE) 1 g tablet Take 1 tablet (1 g total) by mouth See admin instructions. Take 1 tablet (1 g total) by mouth 3 (three) times daily. Dissolve in 3-4 tbsp warm water, swish and swallow 05/16/21   Earlie Server, MD  tiotropium (SPIRIVA HANDIHALER) 18 MCG inhalation capsule PLACE 1 CAPSULE INTO INHALER AND INHALE ITS CONTENTS ONCE DAILY. Patient taking differently: Place 18 mcg into inhaler and inhale in the morning. 05/31/20   Cannady, Henrine Screws T, NP  vitamin B-12 (CYANOCOBALAMIN) 1000 MCG tablet Take 1,000 mcg by mouth daily.    [provider]    Physical Exam: Vitals:   06-05-21 1700 06/05/21 1715 06/05/2021 1730 2021-06-05 1745  BP: (!) 73/54 (!) 68/53 (!) 70/56   Pulse: 84 83 83 83  Resp: (!) 33 (!) 36 (!) 40 (!) 21  Temp:      TempSrc:      SpO2: (!) 88% 91% 92% 94%  Weight:      Height:       General: Not in acute distress.  Cachectic looking HEENT:       Eyes: PERRL, EOMI, no scleral icterus.       ENT: No discharge from the ears and nose, no pharynx injection, no tonsillar  enlargement.        Neck: No JVD, no bruit, no mass felt. Heme: No neck lymph node enlargement. Cardiac: S1/S2, RRR, No murmurs, No gallops or rubs. Respiratory: Decreased air movement bilaterally, has crackles bilaterally, no wheezing GI: Soft, nondistended, nontender, no rebound pain, no organomegaly, BS present. GU: No hematuria Ext: 1+ pitting leg edema bilaterally. 1+DP/PT pulse bilaterally. Musculoskeletal: No joint deformities, No joint redness or warmth, no limitation of ROM in spin. Skin: No rashes.  Neuro: Alert, oriented X3, cranial nerves II-XII grossly intact,  moves all extremities normally. Psych: Patient is not psychotic, no suicidal or hemocidal ideation.  Labs on Admission: I have personally reviewed following labs and imaging studies  CBC: Recent Labs  Lab 2021-06-27 0753  WBC 5.7  NEUTROABS 4.3  HGB 8.2*  HCT 25.5*  MCV 93.4  PLT 74*   Basic Metabolic Panel: Recent Labs  Lab Jun 27, 2021 0753  NA 134*  K 4.6  CL 101  CO2 25  GLUCOSE 154*  BUN 41*  CREATININE 0.72  CALCIUM 8.2*   GFR: Estimated Creatinine Clearance: 79.8 mL/min (by C-G formula based on SCr of 0.72 mg/dL). Liver Function Tests: Recent Labs  Lab 06-27-21 0753  AST 29  ALT 24  ALKPHOS 93  BILITOT 1.1  PROT 5.4*  ALBUMIN 1.7*   No results for input(s): LIPASE, AMYLASE in the last 168 hours. No results for input(s): AMMONIA in the last 168 hours. Coagulation Profile: Recent Labs  Lab June 27, 2021 0744  INR 1.3*   Cardiac Enzymes: No results for input(s): CKTOTAL, CKMB, CKMBINDEX, TROPONINI in the last 168 hours. BNP (last 3 results) No results for input(s): PROBNP in the last 8760 hours. HbA1C: No results for input(s): HGBA1C in the last 72 hours. CBG: No results for input(s): GLUCAP in the last 168 hours. Lipid Profile: No results for input(s): CHOL, HDL, LDLCALC, TRIG, CHOLHDL, LDLDIRECT in the last 72 hours. Thyroid Function Tests: No results for input(s): TSH, T4TOTAL,  FREET4, T3FREE, THYROIDAB in the last 72 hours. Anemia Panel: No results for input(s): VITAMINB12, FOLATE, FERRITIN, TIBC, IRON, RETICCTPCT in the last 72 hours. Urine analysis: No results found for: COLORURINE, APPEARANCEUR, LABSPEC, PHURINE, GLUCOSEU, HGBUR, BILIRUBINUR, KETONESUR, PROTEINUR, UROBILINOGEN, NITRITE, LEUKOCYTESUR Sepsis Labs: @LABRCNTIP (procalcitonin:4,lacticidven:4) ) Recent Results (from the past 240 hour(s))  Resp Panel by RT-PCR (Flu A&B, Covid) Nasopharyngeal Swab     Status: None   Collection Time: 06/27/21  7:44 AM   Specimen: Nasopharyngeal Swab; Nasopharyngeal(NP) swabs in vial transport medium  Result Value Ref Range Status   SARS Coronavirus 2 by RT PCR NEGATIVE NEGATIVE Final    Comment: (NOTE) SARS-CoV-2 target nucleic acids are NOT DETECTED.  The SARS-CoV-2 RNA is generally detectable in upper respiratory specimens during the acute phase of infection. The lowest concentration of SARS-CoV-2 viral copies this assay can detect is 138 copies/mL. A negative result does not preclude SARS-Cov-2 infection and should not be used as the sole basis for treatment or other patient management decisions. A negative result may occur with  improper specimen collection/handling, submission of specimen other than nasopharyngeal swab, presence of viral mutation(s) within the areas targeted by this assay, and inadequate number of viral copies(<138 copies/mL). A negative result must be combined with clinical observations, patient history, and epidemiological information. The expected result is Negative.  Fact Sheet for Patients:  EntrepreneurPulse.com.au  Fact Sheet for Healthcare Providers:  IncredibleEmployment.be  This test is no t yet approved or cleared by the [place] FDA and  has been authorized for detection and/or diagnosis of SARS-CoV-2 by FDA under an Emergency Use Authorization (EUA). This EUA will remain  in effect  (meaning this test can be used) for the duration of the COVID-19 declaration under Section 564(b)(1) of the Act, 21 U.S.C.section 360bbb-3(b)(1), unless the authorization is terminated  or revoked sooner.       Influenza A by PCR NEGATIVE NEGATIVE Final   Influenza B by PCR NEGATIVE NEGATIVE Final    Comment: (NOTE) The Xpert Xpress SARS-CoV-2/FLU/RSV plus assay is intended as an aid in  the diagnosis of influenza from Nasopharyngeal swab specimens and should not be used as a sole basis for treatment. Nasal washings and aspirates are unacceptable for Xpert Xpress SARS-CoV-2/FLU/RSV testing.  Fact Sheet for Patients: EntrepreneurPulse.com.au  Fact Sheet for Healthcare Providers: IncredibleEmployment.be  This test is not yet approved or cleared by the [place] FDA and has been authorized for detection and/or diagnosis of SARS-CoV-2 by FDA under an Emergency Use Authorization (EUA). This EUA will remain in effect (meaning this test can be used) for the duration of the COVID-19 declaration under Section 564(b)(1) of the Act, 21 U.S.C. section 360bbb-3(b)(1), unless the authorization is terminated or revoked.  Performed at [facility], [address] [place], [place] [address]   MRSA Next Gen by PCR, Nasal     Status: None   Collection Time: 26-Jun-2021  7:53 AM   Specimen: Nasal Mucosa; Nasal Swab  Result Value Ref Range Status   MRSA by PCR Next Gen NOT DETECTED NOT DETECTED Final    Comment: (NOTE) The GeneXpert MRSA Assay (FDA approved for NASAL specimens only), is one component of a comprehensive MRSA colonization surveillance program. It is not intended to diagnose MRSA infection nor to guide or monitor treatment for MRSA infections. Test performance is not FDA approved in patients less than 47 years old. Performed at [facility], [address] [place], [place] [address]      Radiological Exams on  Admission: CT Angio Chest PE W and/or Wo Contrast  Result Date: Jun 26, 2021 CLINICAL DATA:  Pulmonary embolism (PE) suspected, high prob. 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 Difficulty sleeping. Chest soreness. EXAM: CT ANGIOGRAPHY CHEST WITH CONTRAST TECHNIQUE: Multidetector CT imaging of the chest was performed using the standard protocol during bolus administration of intravenous contrast. Multiplanar CT image reconstructions and MIPs were obtained to evaluate the vascular anatomy. RADIATION DOSE REDUCTION: This exam was performed according to the departmental dose-optimization program which includes automated exposure control, adjustment of the mA and/or kV according to patient size and/or use of iterative reconstruction technique. CONTRAST:  5mL OMNIPAQUE IOHEXOL 350 MG/ML SOLN COMPARISON:  02/11/2021 PET-CT.  01/24/2021 chest CT. FINDINGS: Cardiovascular: The study is high quality for the evaluation of pulmonary embolism. There are no filling defects in the central, lobar, segmental or subsegmental pulmonary artery branches to suggest acute pulmonary embolism. Atherosclerotic thoracic aorta with stable dilated 4.2 cm ascending thoracic aorta. Left internal jugular Port-A-Cath terminates at the cavoatrial junction. Dilated main pulmonary artery (3.5 cm diameter). Normal heart size. No significant pericardial fluid/thickening. Three-vessel coronary atherosclerosis. Mediastinum/Nodes: No discrete thyroid nodules. Small amount of layering debris in fluid in the mid thoracic esophagus. No axillary adenopathy. Stable mildly enlarged 1.3 cm subcarinal node (series 4/image 49). Newly mildly enlarged 1.1 cm right infrahilar node (series 4/image 56). No left hilar adenopathy. Lungs/Pleura: No pneumothorax. Small dependent right pleural effusion is new. No left pleural effusion. Moderate centrilobular emphysema. Infiltrative large central perihilar cavitary right upper lobe 8.2 x 6.8 cm  lung mass (series 6/image 35), significantly increased from 4.9 x 3.5 cm on 01/24/2021 chest CT and significantly increased in central cavitation, contiguous with the carina, right hilum, subcarinal and lower right paratracheal regions. Increased patchy consolidation throughout the right upper lobe surrounding the central mass. New patchy mild-to-moderate tree-in-bud opacity and small regions of consolidation throughout the remaining right greater than left lungs, most prominent in the basilar right middle lobe and basilar right lower lobe. Indistinct 1.3 x 0.9 cm anterior left lower  lobe pulmonary nodule (series 6/image 76), previously 1.2 x 0.9 cm, not substantially changed. Upper abdomen: No acute abnormality. Musculoskeletal: No aggressive appearing focal osseous lesions. Minimal thoracic spondylosis. Review of the MIP images confirms the above findings. IMPRESSION: 1. No pulmonary embolism. 2. Infiltrative large central perihilar right upper lobe 8.2 x 6.8 cm lung mass, significantly increased in size and degree of central cavitation since 01/24/2021 chest CT, contiguous with the carina, right hilum, subcarinal and lower right paratracheal regions, compatible with progression of malignancy. 3. Widespread patchy consolidation and tree-in-bud opacity throughout the right greater than left lungs, favor multilobar pneumonia, although a component could represent aspiration or alveolar hemorrhage. 4. Stable mild subcarinal and new mild right infrahilar adenopathy, cannot exclude nodal metastases. 5. New small dependent right pleural effusion. 6. Stable dilated 4.2 cm ascending thoracic aorta. Recommend annual imaging followup by CTA or MRA. This recommendation follows 2010 ACCF/AHA/AATS/ACR/ASA/SCA/SCAI/SIR/STS/SVM Guidelines for the Diagnosis and Management of Patients with Thoracic Aortic Disease. Circulation. 2010; 121: P382-N053. Aortic aneurysm NOS (ICD10-I71.9). 7. Dilated main pulmonary artery, suggesting  pulmonary arterial hypertension. 8. Three-vessel coronary atherosclerosis. 9. Small amount of layering debris in fluid in the mid thoracic esophagus, suggesting esophageal dysmotility and/or gastroesophageal reflux. 10. Aortic Atherosclerosis (ICD10-I70.0) and Emphysema (ICD10-J43.9). Electronically Signed   By: Ilona Sorrel M.D.   On: 07/04/2021 09:37   US Venous Img Lower Bilateral  Result Date: Jul 04, 2021 CLINICAL DATA:  Leg swelling. EXAM: BILATERAL LOWER EXTREMITY VENOUS DOPPLER ULTRASOUND TECHNIQUE: Gray-scale sonography with compression, as well as color and duplex ultrasound, were performed to evaluate the deep venous system(s) from the level of the common femoral vein through the popliteal and proximal calf veins. COMPARISON:  None. FINDINGS: VENOUS Normal compressibility of the common femoral, superficial femoral, and popliteal veins, as well as the visualized calf veins. Visualized portions of profunda femoral vein and great saphenous vein unremarkable. No filling defects to suggest DVT on grayscale or color Doppler imaging. Doppler waveforms show normal direction of venous flow, normal respiratory plasticity and response to augmentation. OTHER None. Limitations: None. IMPRESSION: 1. Negative. Electronically Signed   By: Titus Dubin M.D.   On: 07/04/2021 08:34   DG Chest Portable 1 View  Result Date: 07-04-21 CLINICAL DATA:  Absent left-sided lung sounds EXAM: PORTABLE CHEST 1 VIEW COMPARISON:  CT chest, Jul 04, 2021, 9:13 a.m., Chest radiographs, 07/04/21, 7:46 a.m. FINDINGS: The heart size and mediastinal contours are within normal limits. Left chest port catheter. Unchanged, large mass of the right apex and irregular opacity of the bilateral lung bases. The visualized skeletal structures are unremarkable. IMPRESSION: Electronically Signed   By: Delanna Ahmadi M.D.   On: 2021-07-04 14:38   DG Chest Portable 1 View  Result Date: 2021-07-04 CLINICAL DATA:  76 year old male with shortness  of breath. History of treated non-small cell lung cancer. EXAM: PORTABLE CHEST 1 VIEW COMPARISON:  Chest radiographs 03/06/2021 and earlier. FINDINGS: Portable AP semi upright view at 0746 hours. Lower lung volumes with increased confluent right apical lung opacity superimposed on the post radiation architectural distortion and chronic enlargement of the right hilum seen last year. No superimposed pneumothorax or pleural effusion. Stable tortuosity of the thoracic aorta. Other mediastinal contours are within normal limits. New left chest power port in place, catheter tip at the lower SVC level. Stable left lung with mild lower lung scarring. No acute osseous abnormality identified. IMPRESSION: 1. Lower lung volumes with nonspecific increased right apical lung opacity superimposed on treated regional lung cancer. 2. New left chest power  port.  Stable left lung. Electronically Signed   By: Genevie Ann M.D.   On: Jun 06, 2021 07:57      Assessment/Plan Principal Problem:   Comfort measures only status Active Problems:   HCAP (healthcare-associated pneumonia)   CAD (coronary artery disease)   Elevated troponin   Chest pain   Chronic systolic heart failure (HCC)   Malignant neoplasm of bronchus and lung (HCC)   Diabetes mellitus with chronic kidney disease (HCC)   Hyperlipidemia associated with type 2 diabetes mellitus (HCC)   Nicotine dependence, cigarettes, w unsp disorders   Gout   Carotid stenosis   HTN (hypertension)   Thrombocytopenia (HCC)   Chronic kidney disease, stage 3a (HCC)   Depression   Chronic combined systolic and diastolic CHF (congestive heart failure) (HCC)   Ascending aortic aneurysm   Normocytic anemia   New onset atrial fibrillation (HCC)   Chronic atrial fibrillation with RVR ([place])   Comatose ([place])   Addendum-2,  Comfort measures only status: Patient deteriorated in the afternoon.  He became unresponsive.  CPR was performed, patient did regain ROSC, but remains hypotensive  with blood pressure in the 80s.  Patient is still in comatose status. No intubation was done according to patient's own wishes.  Consulted the critical care team. NP, Tonye Royalty saw the patient, and had extensive discussion with patient's family including her sisters and step daughter.  Since patient's prognosis is extremely poor, family decided for comfort care now.  Patient's family members are very supportive.  This decision is very reasonable given his extremely poor prognosis. Pt will be DNR.  -Stop drawing labs and IVF -prn Ativan for agitation -As needed morphine for pain -Naphazoline 0.1 % ophthalmic solution:   -scopolamine 1.5 MG patch  -Psycho/Social: emotional support offered to patient and family at bedside -consult to palliterative care   Addendum-1: New onset atrial fibrillation with RVR, heart rate up to 170s.  Patient was given IV amiodarone and IV metoprolol. Heart rate decreased to 110-120s.  Cannot give anticoagulants due to possible alveolar hemorrhage.  Patient was also given 20 mg of Lasix due to fluid overload.  HCAP (healthcare-associated pneumonia): CT angiogram is negative for PE, showed possible bilateral pneumonia.  CT scan cannot completely rule out the possibility of alveolar hemorrhage.  Consulted pulmonologist.  -Admited to progressive unit as inpatient - IV Vancomycin and cefepime (patient received 1 dose of azithromycin in ED) - Mucinex for cough  - Bronchodilators - Urine legionella and S. pneumococcal antigen - Follow up blood culture x2, sputum culture - will get Procalcitonin and trend lactic acid level  - f/u Dr. Lanney Gins or Dr. Gust Brooms recommendation  CAD (coronary artery disease), chest pain and elevated troponin: trop is minimally elevated 19 --> 18.  Possibly due to demand ischemia.  His chest pain is likely due to pneumonia. -Continue Lipitor -Hold aspirin and Plavix due to possible alveolar hemorrhage  HTN:  -hold home oral metoprolol  and ramipril due to soft Bp  Chronic combined systolic and diastolic CHF: 2D echo on 4/0/9811 showed EF of 45-50% with grade 2 diastolic dysfunction.  Patient now has fluid retention, with 1+ leg edema, indicating fluid overload, but patient's blood pressure is soft, cannot start diuretics. -Watch volume status closely  Malignant neoplasm of bronchus and lung ([place]) -consulted Dr. Tasia Catchings of oncology  Diabetes mellitus with chronic kidney disease [facility]): Recent A1c 6.0, well controlled.  Patient taking metformin -Sliding scale insulin  Hyperlipidemia associated with type 2 diabetes mellitus ([place]): -Lipitor  Nicotine dependence, cigarettes, w unsp disorders -nicotine patch  Gout -Allopurinol  Carotid stenosis -Hold aspirin and Plavix due to possible ovular hemorrhage -Continue Lipitor  Thrombocytopenia ([place]): Platelet is 74, possibly due to chemotherapy -Follow-up with CBC  Chronic kidney disease, stage 3a ([place]): Stable, creatinine 0.72 -Follow-up with BMP  Depression -Continue home medications  Ascending aortic aneurysm: This is incidental finding by CT angiogram -Follow-up with PCP  Normocytic anemia: Hemoglobin 9.4 --> 8.2. -Follow-up with CBC -Type screen              DVT ppx: SCD  Code Status:  Partial code (I discussed with the patient in the presence of his 2 sisters, and explained the meaning of CODE STATUS, patient wants to be partial code, OK for CPR, but no intubation).  Family Communication:  Yes, patient's 2 sisters  at bed side.       Disposition Plan:  Anticipate discharge back to previous environment  Consults called:  Dr. Tasia Catchings of oncology is consulted.  Message sent to pulmonologist for consultation.  Admission status and Level of care: Med-Surg:    as inpt       Severity of Illness:  The appropriate patient status for this patient is INPATIENT. Inpatient status is judged to be reasonable and necessary in order to provide the required  intensity of service to ensure the patient's safety. The patient's presenting symptoms, physical exam findings, and initial radiographic and laboratory data in the context of their chronic comorbidities is felt to place them at high risk for further clinical deterioration. Furthermore, it is not anticipated that the patient will be medically stable for discharge from the hospital within 2 midnights of admission.   * I certify that at the point of admission it is my clinical judgment that the patient will require inpatient hospital care spanning beyond 2 midnights from the point of admission due to high intensity of service, high risk for further deterioration and high frequency of surveillance required.*       Date of Service 21-Jun-2021    Ivor Costa [facility]   If 7PM-7AM, please contact night-coverage [email or web address] 06/21/21, 7:17 PM

## 2021-06-19 NOTE — Progress Notes (Signed)
Provided family with supportive presence. Family is understanding and accepting of Philip Wood's prognosis. Family rooted in [place]. Chaplain offered presence with Philip Wood and family.     2021-06-28 1800  Clinical Encounter Type  Visited With Patient and family together  Visit Type Spiritual support

## 2021-06-19 NOTE — Significant Event (Addendum)
° °      CROSS COVER NOTE  NAME: ELVIS LAUFER MRN: [id number] DOB : 03/02/1946    Notified by RN that patient has expired at 2056  Patient was DNR and comfort care  2 RN verified.  Family was present and available to RN.  Neomia Glass MHA, MSN, FNP-BC Nurse Practitioner [facility] Pager [phone]

## 2021-06-19 NOTE — ED Notes (Signed)
RN contact provider d/t pt anxiousness and agitation.

## 2021-06-19 NOTE — Consult Note (Signed)
Hematology/Oncology Consult note [phone] [phone]      Patient Care Team: Venita Lick, NP as PCP - General (Nurse Practitioner) Wellington Hampshire, MD as PCP - Cardiology (Cardiology) Beverly Gust, MD (Otolaryngology) Telford Nab, RN as Registered Nurse [name], MD as Radiation Oncologist (Radiation Oncology)   Name of the patient: Philip Wood  [id number]  1946/01/02   Date of visit: 06/16/21 REASON FOR COSULTATION:  Lung cancer, hemoptysis, requested by Dr.Niu History of presenting illness-  76 y.o. 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 + Lower extremity swelling. + swallowing difficulty.  Denies nausea, vomiting, diarrhea or abdominal pain. negative COVID PCR, negative FOBT. In ER, he is hypotensive, tachycardia. Pulse ox was 95% Chest x-ray showed low volume. CT angiogram is negative for PE, but showed Infiltrative large central perihilar right upper lobe 8.2 x 6.8 cm lung mass, multilobar pneumonia, possible alveolar hemorrhage. stable mild subcarinal and new mild right infrahilar adenopathy, new small dependent right pleural effusion, dilated 4.2 cm ascending thoracic aorta US venous lower extremities were negative for DVT.   Patient is known to oncology service for squamous cell carcinoma s/p concurrent chemotherapy and radiation, last chemotherapy was 05/16/2021, last RT 05/21/21.    Review of Systems  Constitutional:  Positive for appetite change and fatigue. Negative for chills, fever and unexpected weight change.  HENT:   Negative for hearing loss and voice change.   Eyes:  Negative for eye problems and icterus.  Respiratory:  Positive for cough, hemoptysis and shortness of breath. Negative for chest tightness.    Cardiovascular:  Positive for chest pain. Negative for leg swelling.  Gastrointestinal:  Negative for abdominal distention and abdominal pain.       Swallowing difficulty  Endocrine: Negative for hot flashes.  Genitourinary:  Negative for difficulty urinating, dysuria and frequency.   Musculoskeletal:  Negative for arthralgias.  Skin:  Negative for itching and rash.  Neurological:  Negative for light-headedness and numbness.  Hematological:  Negative for adenopathy. Does not bruise/bleed easily.  Psychiatric/Behavioral:  Negative for confusion.    Allergies  Allergen Reactions   Paclitaxel Shortness Of Breath and Hypertension    Patient had a hypersensitivity reaction to Paclitaxel, resulting in ED transport. See note on 04/18/21 at 1230.    Patient Active Problem List   Diagnosis Date Noted   HCAP (healthcare-associated pneumonia) 06-16-21   Thrombocytopenia ([place]) 06/16/2021   Chronic kidney disease, stage 3a ([place]) Jun 16, 2021   Depression 2021/06/16   CAD (coronary artery disease) Jun 16, 2021   Elevated troponin 06/16/21   Chronic combined systolic and diastolic CHF (congestive heart failure) ([place]) 2021/06/16   Ascending aortic aneurysm 2021/06/16   Chest pain 06-16-21   Normocytic anemia June 16, 2021   Comfort measures only status Jun 16, 2021   Hypoalbuminemia due to protein-calorie malnutrition ([place]) 05/09/2021   Malignant neoplasm of bronchus and lung ([place]) 04/25/2021   Encounter for antineoplastic chemotherapy 04/25/2021   Goals of care, counseling/discussion 03/22/2021   Kidney lesion 03/22/2021   Symptomatic carotid artery narrowing without infarction 01/30/2021   Carotid stenosis 11/27/2020   HTN (hypertension) 11/27/2020   B12 deficiency 06/01/2020   Aortic atherosclerosis ([place]) 85/27/7824   Chronic systolic heart failure ([place]) 08/13/2018   Lung cancer ([place]) 08/14/2017   Advanced care planning/counseling discussion 07/11/2016   Diabetes mellitus with  chronic  kidney disease ([place]) 12/21/2014   Hyperlipidemia associated with type 2 diabetes mellitus ([place]) 12/21/2014   Centrilobular emphysema ([place]) 12/21/2014   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[place]) 12/21/2014   CKD (chronic kidney disease), stage III ([place]) 12/21/2014   Nicotine dependence, cigarettes, w unsp disorders 12/21/2014   Lumbago 12/21/2014   Gout 12/21/2014     Past Medical History:  Diagnosis Date   Arthritis    Bruit of left carotid artery    Cancer ([place])    laryngeal   Chronic heart failure (HCC)    Chronic kidney disease    COPD (chronic obstructive pulmonary disease) (HCC)    Coronary artery disease    Diabetes mellitus without complication (HCC)    GERD (gastroesophageal reflux disease)    Headache    History of kidney stones    Hyperlipidemia    Hypertension    Lumbago    Neuropathy    Tobacco abuse disorder      Past Surgical History:  Procedure Laterality Date   ARTERY BIOPSY Right 11/14/2020   Procedure: BIOPSY TEMPORAL ARTERY;  Surgeon: Katha Cabal, MD;  Location: [facility];  Service: Vascular;  Laterality: Right;   CAROTID PTA/STENT INTERVENTION Left 01/30/2021   Procedure: CAROTID PTA/STENT INTERVENTION;  Surgeon: Katha Cabal, MD;  Location: [facility];  Service: Cardiovascular;  Laterality: Left;   CHOLECYSTECTOMY     ELECTROMAGNETIC NAVIGATION BROCHOSCOPY N/A 08/19/2017   Procedure: ELECTROMAGNETIC NAVIGATION BRONCHOSCOPY;  Surgeon: Flora Lipps, MD;  Location: [facility];  Service: Cardiopulmonary;  Laterality: N/A;   ELECTROMAGNETIC NAVIGATION BROCHOSCOPY Left 09/02/2017   Procedure: ELECTROMAGNETIC NAVIGATION BRONCHOSCOPY;  Surgeon: Flora Lipps, MD;  Location: [facility];  Service: Cardiopulmonary;  Laterality: Left;   IR IMAGING GUIDED PORT INSERTION  03/29/2021   THROAT SURGERY     VIDEO BRONCHOSCOPY WITH ENDOBRONCHIAL ULTRASOUND N/A 03/06/2021   Procedure: VIDEO BRONCHOSCOPY WITH ENDOBRONCHIAL  ULTRASOUND;  Surgeon: Tyler Pita, MD;  Location: [facility];  Service: Cardiopulmonary;  Laterality: N/A;    Social History   Socioeconomic History   Marital status: Widowed    Spouse name: Not on file   Number of children: Not on file   Years of education: Not on file   Highest education level: 10th grade  Occupational History   Not on file  Tobacco Use   Smoking status: Light Smoker    Packs/day: 0.50    Years: 53.00    Pack years: 26.50    Types: Cigarettes   Smokeless tobacco: Never   Tobacco comments:    Aprrox 10 cigs/day--02/21/2021  Vaping Use   Vaping Use: Never used  Substance and Sexual Activity   Alcohol use: No    Alcohol/week: 0.0 standard drinks   Drug use: No   Sexual activity: Not Currently  Other Topics Concern   Not on file  Social History Narrative   Works part time at [employer] retired   Investment banker, operational of Radio broadcast assistant Strain: Low Risk    Difficulty of Paying Living Expenses: Not hard at all  Food Insecurity: No Food Insecurity   Worried About Charity fundraiser in the Last Year: Never true   Arboriculturist in the Last Year: Never true  Transportation Needs: No Transportation Needs   Lack of Transportation (Medical): No   Lack of Transportation (Non-Medical): No  Physical Activity: Inactive   Days of Exercise per Week: 0 days  Minutes of Exercise per Session: 0 min  Stress: No Stress Concern Present   Feeling of Stress : Not at all  Social Connections: Not on file  Intimate Partner Violence: Not on file     Family History  Problem Relation Age of Onset   Heart disease Father    Heart attack Father    Brain cancer Sister    Cervical cancer Sister    Stomach cancer Sister    Stomach cancer Sister    Diabetes Sister    Lupus Sister    Rectal cancer Brother    Lung cancer Brother    Lung cancer Brother    Lung cancer Brother    Lung cancer Brother      Current Facility-Administered Medications:    LORazepam  (ATIVAN) injection 1 mg, 1 mg, Intravenous, Q6H PRN, Ivor Costa, MD, 1 mg at 06/19/21 1639   morphine (PF) 2 MG/ML injection 2 mg, 2 mg, Intravenous, Q2H PRN, Ivor Costa, MD   naphazoline-pheniramine (NAPHCON-A) 0.025-0.3 % ophthalmic solution 1 drop, 1 drop, Both Eyes, QID PRN, Ivor Costa, MD   ondansetron (ZOFRAN) injection 4 mg, 4 mg, Intravenous, Q8H PRN, Ivor Costa, MD   scopolamine (TRANSDERM-SCOP) 1 MG/3DAYS 1.5 mg, 1 patch, Transdermal, Q72H, Ivor Costa, MD  Current Outpatient Medications:    albuterol (VENTOLIN HFA) 108 (90 Base) MCG/ACT inhaler, Inhale 2 puffs into the lungs every 6 (six) hours as needed for wheezing or shortness of breath., Disp: 8 g, Rfl: 2   allopurinol (ZYLOPRIM) 100 MG tablet, Take 1 tablet (100 mg total) by mouth daily. (Patient taking differently: Take 100 mg by mouth in the morning.), Disp: 90 tablet, Rfl: 4   aspirin EC 81 MG tablet, Take 81 mg by mouth in the morning. Swallow whole., Disp: , Rfl:    atorvastatin (LIPITOR) 80 MG tablet, Take 1 tablet (80 mg total) by mouth daily at 6 PM., Disp: 90 tablet, Rfl: 4   clopidogrel (PLAVIX) 75 MG tablet, Take 1 tablet (75 mg total) by mouth daily., Disp: 30 tablet, Rfl: 11   lidocaine-prilocaine (EMLA) cream, Apply to affected area once, Disp: 30 g, Rfl: 3   metFORMIN (GLUCOPHAGE) 500 MG tablet, Take 1 tablet (500 mg total) by mouth 2 (two) times daily with a meal., Disp: 180 tablet, Rfl: 4   metoprolol succinate (TOPROL XL) 25 MG 24 hr tablet, Take 1 tablet (25 mg total) by mouth daily. (Patient taking differently: Take 25 mg by mouth in the morning.), Disp: 90 tablet, Rfl: 4   mirtazapine (REMERON) 7.5 MG tablet, Take 1 tablet (7.5 mg total) by mouth at bedtime., Disp: 90 tablet, Rfl: 4   omeprazole (PRILOSEC) 20 MG capsule, Take 1 capsule (20 mg total) by mouth daily., Disp: 30 capsule, Rfl: 0   ramipril (ALTACE) 10 MG capsule, Take 1 capsule (10 mg total) by mouth daily. (Patient taking differently: Take 10 mg by  mouth in the morning.), Disp: 90 capsule, Rfl: 4   sucralfate (CARAFATE) 1 g tablet, Take 1 tablet (1 g total) by mouth See admin instructions. Take 1 tablet (1 g total) by mouth 3 (three) times daily. Dissolve in 3-4 tbsp warm water, swish and swallow, Disp: 90 tablet, Rfl: 0   tiotropium (SPIRIVA HANDIHALER) 18 MCG inhalation capsule, PLACE 1 CAPSULE INTO INHALER AND INHALE ITS CONTENTS ONCE DAILY. (Patient taking differently: Place 18 mcg into inhaler and inhale in the morning.), Disp: 90 capsule, Rfl: 4   vitamin B-12 (CYANOCOBALAMIN) 1000 MCG tablet, Take 1,000  mcg by mouth daily., Disp: , Rfl:    acetaminophen (TYLENOL) 650 MG CR tablet, Take 1,300 mg by mouth every 8 (eight) hours as needed for pain., Disp: , Rfl:    diphenhydrAMINE (BENADRYL ALLERGY) 25 MG tablet, Take 1 tablet (25 mg total) by mouth every 6 (six) hours as needed., Disp: 30 tablet, Rfl: 0   Ensure (ENSURE), Take 237 mLs by mouth daily., Disp: , Rfl:    prochlorperazine (COMPAZINE) 10 MG tablet, Take 1 tablet (10 mg total) by mouth every 6 (six) hours as needed (Nausea or vomiting)., Disp: 30 tablet, Rfl: 1   Physical exam:  Vitals:   16-Jun-2021 1700 06-16-2021 1715 2021/06/16 1730 June 16, 2021 1745  BP: (!) 73/54 (!) 68/53 (!) 70/56   Pulse: 84 83 83 83  Resp: (!) 33 (!) 36 (!) 40 (!) 21  Temp:      TempSrc:      SpO2: (!) 88% 91% 92% 94%  Weight:      Height:       Physical Exam Constitutional:      Appearance: He is ill-appearing.  HENT:     Head: Normocephalic and atraumatic.     Nose: Nose normal.     Mouth/Throat:     Pharynx: No oropharyngeal exudate.  Eyes:     General: No scleral icterus.    Pupils: Pupils are equal, round, and reactive to light.  Cardiovascular:     Rate and Rhythm: Tachycardia present.     Heart sounds: No murmur heard. Pulmonary:     Effort: Pulmonary effort is normal. No respiratory distress.     Breath sounds: Rhonchi present.  Abdominal:     General: There is no distension.      Palpations: Abdomen is soft.     Tenderness: There is no abdominal tenderness.  Musculoskeletal:        General: Normal range of motion.     Cervical back: Normal range of motion and neck supple.  Skin:    General: Skin is warm and dry.     Findings: No erythema.  Neurological:     Mental Status: He is alert and oriented to person, place, and time.     Motor: No abnormal muscle tone.  Psychiatric:        Mood and Affect: Mood and affect normal.        CMP Latest Ref Rng & Units 2021-06-16  Glucose 70 - 99 mg/dL 154(H)  BUN 8 - 23 mg/dL 41(H)  Creatinine 0.61 - 1.24 mg/dL 0.72  Sodium 135 - 145 mmol/L 134(L)  Potassium 3.5 - 5.1 mmol/L 4.6  Chloride 98 - 111 mmol/L 101  CO2 22 - 32 mmol/L 25  Calcium 8.9 - 10.3 mg/dL 8.2(L)  Total Protein 6.5 - 8.1 g/dL 5.4(L)  Total Bilirubin 0.3 - 1.2 mg/dL 1.1  Alkaline Phos 38 - 126 U/L 93  AST 15 - 41 U/L 29  ALT 0 - 44 U/L 24   CBC Latest Ref Rng & Units Jun 16, 2021  WBC 4.0 - 10.5 K/uL 5.7  Hemoglobin 13.0 - 17.0 g/dL 8.2(L)  Hematocrit 39.0 - 52.0 % 25.5(L)  Platelets 150 - 400 K/uL 74(L)    RADIOGRAPHIC STUDIES: I have personally reviewed the radiological images as listed and agreed with the findings in the report. CT Angio Chest PE W and/or Wo Contrast  Result Date: Jun 16, 2021 CLINICAL DATA:  Pulmonary embolism (PE) suspected, high prob. 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 Difficulty sleeping. Chest  soreness. EXAM: CT ANGIOGRAPHY CHEST WITH CONTRAST TECHNIQUE: Multidetector CT imaging of the chest was performed using the standard protocol during bolus administration of intravenous contrast. Multiplanar CT image reconstructions and MIPs were obtained to evaluate the vascular anatomy. RADIATION DOSE REDUCTION: This exam was performed according to the departmental dose-optimization program which includes automated exposure control, adjustment of the mA and/or kV according to patient size and/or use of  iterative reconstruction technique. CONTRAST:  57mL OMNIPAQUE IOHEXOL 350 MG/ML SOLN COMPARISON:  02/11/2021 PET-CT.  01/24/2021 chest CT. FINDINGS: Cardiovascular: The study is high quality for the evaluation of pulmonary embolism. There are no filling defects in the central, lobar, segmental or subsegmental pulmonary artery branches to suggest acute pulmonary embolism. Atherosclerotic thoracic aorta with stable dilated 4.2 cm ascending thoracic aorta. Left internal jugular Port-A-Cath terminates at the cavoatrial junction. Dilated main pulmonary artery (3.5 cm diameter). Normal heart size. No significant pericardial fluid/thickening. Three-vessel coronary atherosclerosis. Mediastinum/Nodes: No discrete thyroid nodules. Small amount of layering debris in fluid in the mid thoracic esophagus. No axillary adenopathy. Stable mildly enlarged 1.3 cm subcarinal node (series 4/image 49). Newly mildly enlarged 1.1 cm right infrahilar node (series 4/image 56). No left hilar adenopathy. Lungs/Pleura: No pneumothorax. Small dependent right pleural effusion is new. No left pleural effusion. Moderate centrilobular emphysema. Infiltrative large central perihilar cavitary right upper lobe 8.2 x 6.8 cm lung mass (series 6/image 35), significantly increased from 4.9 x 3.5 cm on 01/24/2021 chest CT and significantly increased in central cavitation, contiguous with the carina, right hilum, subcarinal and lower right paratracheal regions. Increased patchy consolidation throughout the right upper lobe surrounding the central mass. New patchy mild-to-moderate tree-in-bud opacity and small regions of consolidation throughout the remaining right greater than left lungs, most prominent in the basilar right middle lobe and basilar right lower lobe. Indistinct 1.3 x 0.9 cm anterior left lower lobe pulmonary nodule (series 6/image 76), previously 1.2 x 0.9 cm, not substantially changed. Upper abdomen: No acute abnormality. Musculoskeletal: No  aggressive appearing focal osseous lesions. Minimal thoracic spondylosis. Review of the MIP images confirms the above findings. IMPRESSION: 1. No pulmonary embolism. 2. Infiltrative large central perihilar right upper lobe 8.2 x 6.8 cm lung mass, significantly increased in size and degree of central cavitation since 01/24/2021 chest CT, contiguous with the carina, right hilum, subcarinal and lower right paratracheal regions, compatible with progression of malignancy. 3. Widespread patchy consolidation and tree-in-bud opacity throughout the right greater than left lungs, favor multilobar pneumonia, although a component could represent aspiration or alveolar hemorrhage. 4. Stable mild subcarinal and new mild right infrahilar adenopathy, cannot exclude nodal metastases. 5. New small dependent right pleural effusion. 6. Stable dilated 4.2 cm ascending thoracic aorta. Recommend annual imaging followup by CTA or MRA. This recommendation follows 2010 ACCF/AHA/AATS/ACR/ASA/SCA/SCAI/SIR/STS/SVM Guidelines for the Diagnosis and Management of Patients with Thoracic Aortic Disease. Circulation. 2010; 121: D924-Q683. Aortic aneurysm NOS (ICD10-I71.9). 7. Dilated main pulmonary artery, suggesting pulmonary arterial hypertension. 8. Three-vessel coronary atherosclerosis. 9. Small amount of layering debris in fluid in the mid thoracic esophagus, suggesting esophageal dysmotility and/or gastroesophageal reflux. 10. Aortic Atherosclerosis (ICD10-I70.0) and Emphysema (ICD10-J43.9). Electronically Signed   By: Ilona Sorrel M.D.   On: 2021/06/26 09:37   US Venous Img Lower Bilateral  Result Date: 06/26/2021 CLINICAL DATA:  Leg swelling. EXAM: BILATERAL LOWER EXTREMITY VENOUS DOPPLER ULTRASOUND TECHNIQUE: Gray-scale sonography with compression, as well as color and duplex ultrasound, were performed to evaluate the deep venous system(s) from the level of the common femoral vein through  the popliteal and proximal calf veins.  COMPARISON:  None. FINDINGS: VENOUS Normal compressibility of the common femoral, superficial femoral, and popliteal veins, as well as the visualized calf veins. Visualized portions of profunda femoral vein and great saphenous vein unremarkable. No filling defects to suggest DVT on grayscale or color Doppler imaging. Doppler waveforms show normal direction of venous flow, normal respiratory plasticity and response to augmentation. OTHER None. Limitations: None. IMPRESSION: 1. Negative. Electronically Signed   By: Titus Dubin M.D.   On: 06-Jun-2021 08:34   DG Chest Portable 1 View  Result Date: 06-Jun-2021 CLINICAL DATA:  Absent left-sided lung sounds EXAM: PORTABLE CHEST 1 VIEW COMPARISON:  CT chest, June 06, 2021, 9:13 a.m., Chest radiographs, 06-06-21, 7:46 a.m. FINDINGS: The heart size and mediastinal contours are within normal limits. Left chest port catheter. Unchanged, large mass of the right apex and irregular opacity of the bilateral lung bases. The visualized skeletal structures are unremarkable. IMPRESSION: Electronically Signed   By: Delanna Ahmadi M.D.   On: 06/06/2021 14:38   DG Chest Portable 1 View  Result Date: 06-06-21 CLINICAL DATA:  76 year old male with shortness of breath. History of treated non-small cell lung cancer. EXAM: PORTABLE CHEST 1 VIEW COMPARISON:  Chest radiographs 03/06/2021 and earlier. FINDINGS: Portable AP semi upright view at 0746 hours. Lower lung volumes with increased confluent right apical lung opacity superimposed on the post radiation architectural distortion and chronic enlargement of the right hilum seen last year. No superimposed pneumothorax or pleural effusion. Stable tortuosity of the thoracic aorta. Other mediastinal contours are within normal limits. New left chest power port in place, catheter tip at the lower SVC level. Stable left lung with mild lower lung scarring. No acute osseous abnormality identified. IMPRESSION: 1. Lower lung volumes with  nonspecific increased right apical lung opacity superimposed on treated regional lung cancer. 2. New left chest power port.  Stable left lung. Electronically Signed   By: Genevie Ann M.D.   On: 06/06/2021 07:57    Assessment and plan-   # HCAP/multifocal pneumonia Agree with broad spectrum antibiotics [Vancomycin and cefepime] supportive care, follow blood culture and sputum culture.  Possible alvelolar hemorrhage, Consult pulmonology.   #Recurrent stage III lung squamous carcinoma, status post concurrent chemoradiation.  Finished 2 weeks ago. CT scan showed increasing size of right upper lobe lung mass, with cavitary changes. More likely reflecting postradiation changes, central necrosis.  Not necessarily disease progression.  Plan to repeat CT outpatient after he recovers from current infection.  Consult palliative care service for discussion of goals of care and CODE STATUS.   Thank you for allowing me to participate in the care of this patient.    Earlie Server, MD, PhD Hematology Oncology  June 06, 2021

## 2021-06-19 NOTE — ED Notes (Signed)
Pt is have accessory muscle breathing on 15l/min.

## 2021-06-19 NOTE — Code Documentation (Signed)
RT assisting in ventilations at this time.

## 2021-06-19 NOTE — ED Notes (Signed)
Pt family came out of room and sts that pt is very SOB. RN went and looked at pt. RN pulled pt scheduled meds and PRN. Pt has audible wheezing. RN contacted provider. Pt HR in the 150's, RN completed an EKG. Per Dr. Blaine Hamper, give metoprolol as ordered.

## 2021-06-19 NOTE — ED Notes (Signed)
Erlene Quan RN aware of assigned bed

## 2021-06-19 NOTE — ED Notes (Signed)
Hospitalist NP, K. Foust, notified that time of death was called by 2 RN's at Jul 06, 2054. Two RN's who called: Erlene Quan. G. And Antacia. B.

## 2021-06-19 NOTE — Code Documentation (Signed)
Family came to nurses station. RN found pt to be unresponsive and not breathing. RN felt for pulse, no pulse was to be found.

## 2021-06-19 NOTE — ED Notes (Signed)
Pt handoff completed before this RN gave report. RN contacted ICU to give report the nurse taking pt. ICU nurse will call ED RN back.

## 2021-06-19 NOTE — Progress Notes (Signed)
Pharmacy Antibiotic Note  Philip Wood is a 76 y.o. male admitted on 06/24/2021 with pneumonia.  Pharmacy has been consulted for cefepime and vancomycin dosing.  Plan: Cefepime 2 g IV q8h Vancomycin 1750 mg IV x 1 loading dose followed by 1750 mg IV q24h Est AUC: 474.1 Used: Scr 0.8 (rounded up), IBW, Vd 0.72 Obtain vanc levels around 4th or 5th dose if continued Follow up cultures  Height: 5\' 9"  (175.3 cm) Weight: 72.6 kg (160 lb) IBW/kg (Calculated) : 70.7  Temp (24hrs), Avg:97.5 F (36.4 C), Min:97.5 F (36.4 C), Max:97.5 F (36.4 C)  Recent Labs  Lab June 24, 2021 0753  WBC 5.7  CREATININE 0.72    Estimated Creatinine Clearance: 79.8 mL/min (by C-G formula based on SCr of 0.72 mg/dL).    Allergies  Allergen Reactions   Paclitaxel Shortness Of Breath and Hypertension    Patient had a hypersensitivity reaction to Paclitaxel, resulting in ED transport. See note on 04/18/21 at 1230.    Antimicrobials this admission: 2/14 cefepime >>  2/14 vancomycin >>    Microbiology results: 2/14 BCx: sent 2/14 MRSA PCR: sent  Thank you for allowing pharmacy to be a part of this patients care.  Forde Dandy Shaylah Mcghie 2021-06-24 10:49 AM

## 2021-06-19 NOTE — Progress Notes (Signed)
Critical care called for admission to the ICU. Patient presented this AM around 0700 with complaints of difficulty sleeping. While in the ED undergoing workup, patient went into Afib w/RVR requiring treatment. A little while after following this episode, patient went into cardiac arrest requiring CPR. ROSC was achieved however patient is a DNI and currently w/agonal breathing.   I went to bedside to evaluate patient and talk with family. Patients two sisters at bedside. They confirmed patient would not want intubation. Patient currently unresponsive, on a non-rebreather w/oxygen saturation of 87%, RR 40 w/accessory muscle use in moderate distress. I discussed w/the sisters that patient is severe respiratory distress at this time and this would likely lead to patient going back into cardiac arrest. I explained that without intubation this would likely progress to death. I discussed with them that at this point our team would recommend comfort directed measures and no further CPR.   Both sisters were very understanding and agreed with plan to change to comfort directed measures and full DNR.  DNR order placed.     Tonye Royalty ACNP-BC

## 2021-06-19 DEATH — deceased

## 2021-06-20 ENCOUNTER — Ambulatory Visit: Payer: Medicare Other | Admitting: Oncology

## 2021-06-20 ENCOUNTER — Ambulatory Visit: Payer: Medicare Other

## 2021-06-20 ENCOUNTER — Other Ambulatory Visit: Payer: Medicare Other

## 2021-06-26 ENCOUNTER — Ambulatory Visit: Payer: Medicare Other | Admitting: Radiation Oncology

## 2021-09-02 ENCOUNTER — Ambulatory Visit: Payer: Medicare Other

## 2021-10-30 ENCOUNTER — Ambulatory Visit: Payer: Medicare Other | Admitting: Nurse Practitioner
